# Patient Record
Sex: Female | Born: 1970
Health system: Southern US, Community
[De-identification: ages and names within clinical notes are randomized; demographics above are authoritative.]

## PROBLEM LIST (undated history)

## (undated) DIAGNOSIS — E119 Type 2 diabetes mellitus without complications: Secondary | ICD-10-CM

## (undated) DIAGNOSIS — I10 Essential (primary) hypertension: Secondary | ICD-10-CM

## (undated) DIAGNOSIS — E7800 Pure hypercholesterolemia, unspecified: Secondary | ICD-10-CM

## (undated) DIAGNOSIS — I219 Acute myocardial infarction, unspecified: Secondary | ICD-10-CM

## (undated) DIAGNOSIS — I2510 Atherosclerotic heart disease of native coronary artery without angina pectoris: Secondary | ICD-10-CM

## (undated) DIAGNOSIS — M792 Neuralgia and neuritis, unspecified: Secondary | ICD-10-CM

## (undated) DIAGNOSIS — Z720 Tobacco use: Secondary | ICD-10-CM

## (undated) DIAGNOSIS — F1410 Cocaine abuse, uncomplicated: Secondary | ICD-10-CM

## (undated) DIAGNOSIS — K219 Gastro-esophageal reflux disease without esophagitis: Secondary | ICD-10-CM

## (undated) HISTORY — DX: Neuralgia and neuritis, unspecified: M79.2

## (undated) HISTORY — DX: Type 2 diabetes mellitus without complications: E11.9

## (undated) HISTORY — DX: Tobacco use: Z72.0

## (undated) HISTORY — DX: Atherosclerotic heart disease of native coronary artery without angina pectoris: I25.10

---

## 2015-08-03 DIAGNOSIS — I2510 Atherosclerotic heart disease of native coronary artery without angina pectoris: Secondary | ICD-10-CM

## 2015-08-03 HISTORY — DX: Atherosclerotic heart disease of native coronary artery without angina pectoris: I25.10

## 2016-04-02 DIAGNOSIS — I219 Acute myocardial infarction, unspecified: Secondary | ICD-10-CM

## 2016-04-02 HISTORY — DX: Acute myocardial infarction, unspecified: I21.9

## 2017-06-09 ENCOUNTER — Ambulatory Visit (HOSPITAL_COMMUNITY)
Admission: AD | Admit: 2017-06-09 | Discharge: 2017-06-09 | Disposition: A | Discharge status: Home / Self Care | Payer: Self-pay | Attending: Psychiatry | Admitting: Psychiatry

## 2017-06-09 ENCOUNTER — Emergency Department (HOSPITAL_COMMUNITY)
Admission: EM | Admit: 2017-06-09 | Discharge: 2017-06-09 | Disposition: A | Discharge status: Home / Self Care | Payer: Self-pay | Attending: Emergency Medicine | Admitting: Emergency Medicine

## 2017-06-09 ENCOUNTER — Encounter (HOSPITAL_COMMUNITY): Payer: Self-pay | Admitting: Emergency Medicine

## 2017-06-09 ENCOUNTER — Emergency Department (HOSPITAL_COMMUNITY): Payer: Self-pay

## 2017-06-09 DIAGNOSIS — F332 Major depressive disorder, recurrent severe without psychotic features: Secondary | ICD-10-CM | POA: Insufficient documentation

## 2017-06-09 DIAGNOSIS — Z8679 Personal history of other diseases of the circulatory system: Secondary | ICD-10-CM | POA: Insufficient documentation

## 2017-06-09 DIAGNOSIS — R0789 Other chest pain: Secondary | ICD-10-CM | POA: Insufficient documentation

## 2017-06-09 DIAGNOSIS — F17210 Nicotine dependence, cigarettes, uncomplicated: Secondary | ICD-10-CM | POA: Insufficient documentation

## 2017-06-09 DIAGNOSIS — E119 Type 2 diabetes mellitus without complications: Secondary | ICD-10-CM | POA: Insufficient documentation

## 2017-06-09 DIAGNOSIS — R45851 Suicidal ideations: Secondary | ICD-10-CM | POA: Insufficient documentation

## 2017-06-09 DIAGNOSIS — F1490 Cocaine use, unspecified, uncomplicated: Secondary | ICD-10-CM | POA: Insufficient documentation

## 2017-06-09 DIAGNOSIS — I252 Old myocardial infarction: Secondary | ICD-10-CM | POA: Insufficient documentation

## 2017-06-09 DIAGNOSIS — F1414 Cocaine abuse with cocaine-induced mood disorder: Secondary | ICD-10-CM | POA: Insufficient documentation

## 2017-06-09 DIAGNOSIS — Z79899 Other long term (current) drug therapy: Secondary | ICD-10-CM | POA: Insufficient documentation

## 2017-06-09 DIAGNOSIS — I10 Essential (primary) hypertension: Secondary | ICD-10-CM | POA: Insufficient documentation

## 2017-06-09 DIAGNOSIS — R443 Hallucinations, unspecified: Secondary | ICD-10-CM

## 2017-06-09 DIAGNOSIS — F1910 Other psychoactive substance abuse, uncomplicated: Secondary | ICD-10-CM

## 2017-06-09 HISTORY — DX: Type 2 diabetes mellitus without complications: E11.9

## 2017-06-09 HISTORY — DX: Acute myocardial infarction, unspecified: I21.9

## 2017-06-09 HISTORY — DX: Essential (primary) hypertension: I10

## 2017-06-09 LAB — CBC WITH DIFFERENTIAL/PLATELET
BASOS ABS: 0 10*3/uL (ref 0.0–0.1)
Basophils Relative: 0 %
Eosinophils Absolute: 0 10*3/uL (ref 0.0–0.7)
Eosinophils Relative: 1 %
HEMATOCRIT: 29.7 % — AB (ref 36.0–46.0)
Hemoglobin: 9.6 g/dL — ABNORMAL LOW (ref 12.0–15.0)
LYMPHS ABS: 2.6 10*3/uL (ref 0.7–4.0)
LYMPHS PCT: 44 %
MCH: 24.9 pg — AB (ref 26.0–34.0)
MCHC: 32.3 g/dL (ref 30.0–36.0)
MCV: 77.1 fL — ABNORMAL LOW (ref 78.0–100.0)
MONO ABS: 0.6 10*3/uL (ref 0.1–1.0)
MONOS PCT: 10 %
NEUTROS ABS: 2.6 10*3/uL (ref 1.7–7.7)
Neutrophils Relative %: 45 %
Platelets: 269 10*3/uL (ref 150–400)
RBC: 3.85 MIL/uL — ABNORMAL LOW (ref 3.87–5.11)
RDW: 20.7 % — AB (ref 11.5–15.5)
WBC: 5.8 10*3/uL (ref 4.0–10.5)

## 2017-06-09 LAB — COMPREHENSIVE METABOLIC PANEL
ALBUMIN: 3.5 g/dL (ref 3.5–5.0)
ALT: 12 U/L — ABNORMAL LOW (ref 14–54)
ANION GAP: 6 (ref 5–15)
AST: 31 U/L (ref 15–41)
Alkaline Phosphatase: 58 U/L (ref 38–126)
BUN: 11 mg/dL (ref 6–20)
CHLORIDE: 105 mmol/L (ref 101–111)
CO2: 22 mmol/L (ref 22–32)
Calcium: 8.8 mg/dL — ABNORMAL LOW (ref 8.9–10.3)
Creatinine, Ser: 0.99 mg/dL (ref 0.44–1.00)
GFR calc Af Amer: >60 mL/min (ref >60)
GFR calc non Af Amer: >60 mL/min (ref >60)
GLUCOSE: 300 mg/dL — AB (ref 65–99)
POTASSIUM: 5.3 mmol/L — AB (ref 3.5–5.1)
SODIUM: 133 mmol/L — AB (ref 135–145)
TOTAL PROTEIN: 6.8 g/dL (ref 6.5–8.1)
Total Bilirubin: 1.5 mg/dL — ABNORMAL HIGH (ref 0.3–1.2)

## 2017-06-09 LAB — RAPID URINE DRUG SCREEN, HOSP PERFORMED
AMPHETAMINES: NOT DETECTED
Barbiturates: POSITIVE — AB
Benzodiazepines: NOT DETECTED
Cocaine: POSITIVE — AB
Opiates: NOT DETECTED
TETRAHYDROCANNABINOL: NOT DETECTED

## 2017-06-09 LAB — ACETAMINOPHEN LEVEL: Acetaminophen (Tylenol), Serum: <10 ug/mL — ABNORMAL LOW (ref 10–30)

## 2017-06-09 LAB — SALICYLATE LEVEL: Salicylate Lvl: <7 mg/dL (ref 2.8–30.0)

## 2017-06-09 LAB — URINALYSIS, ROUTINE W REFLEX MICROSCOPIC
Bilirubin Urine: NEGATIVE
HGB URINE DIPSTICK: NEGATIVE
Ketones, ur: NEGATIVE mg/dL
LEUKOCYTES UA: NEGATIVE
NITRITE: NEGATIVE
Protein, ur: NEGATIVE mg/dL
SPECIFIC GRAVITY, URINE: 1.036 — AB (ref 1.005–1.030)
pH: 5 (ref 5.0–8.0)

## 2017-06-09 LAB — I-STAT TROPONIN, ED
TROPONIN I, POC: 0 ng/mL (ref 0.00–0.08)
Troponin i, poc: 0 ng/mL (ref 0.00–0.08)

## 2017-06-09 LAB — ETHANOL

## 2017-06-09 IMAGING — DX DG CHEST 2V
2 series · 2 of 2 positions shown · non-contrast
Comparison: None.

CLINICAL DATA: Chest pain

EXAM:
CHEST  2 VIEW

[chest pa]
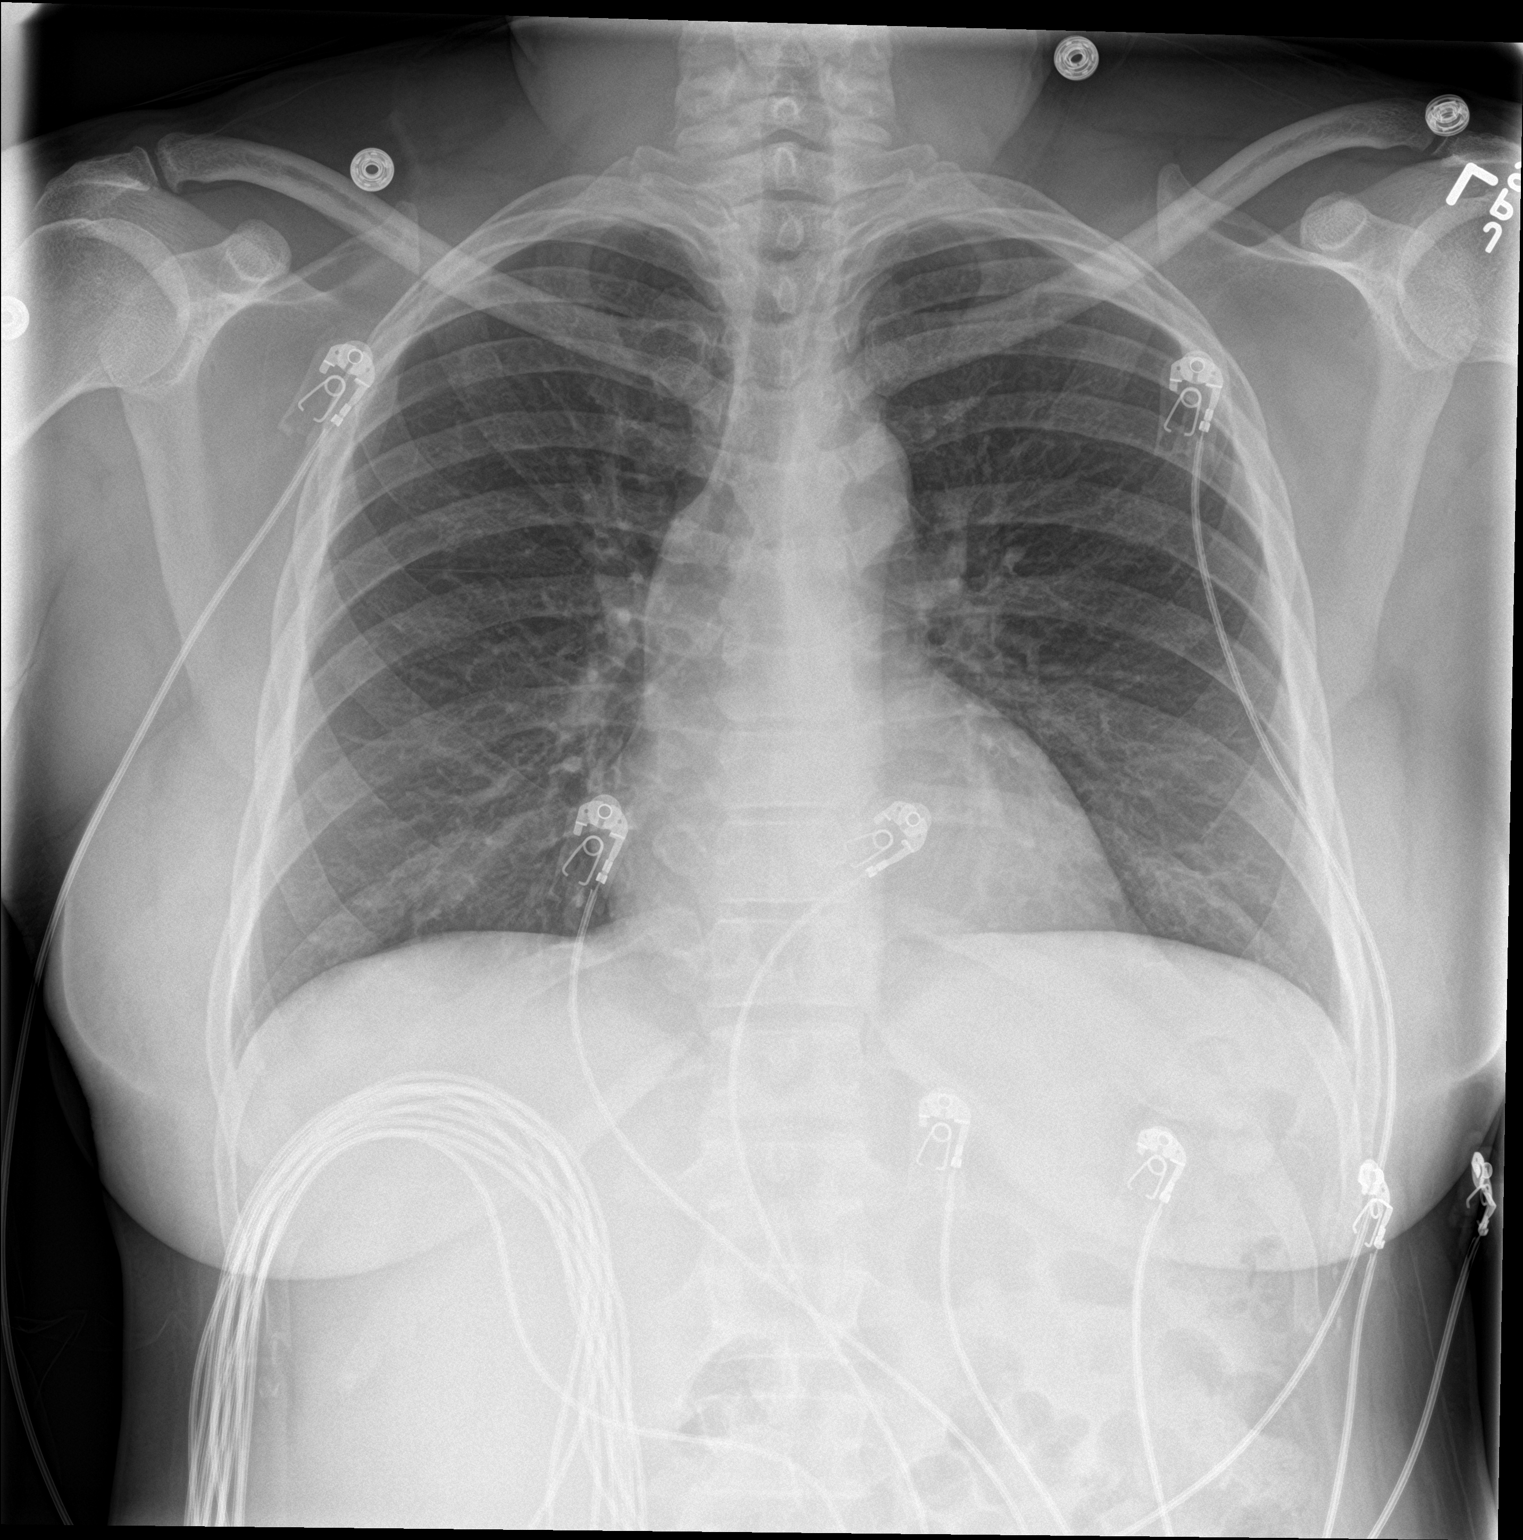

[chest lat]
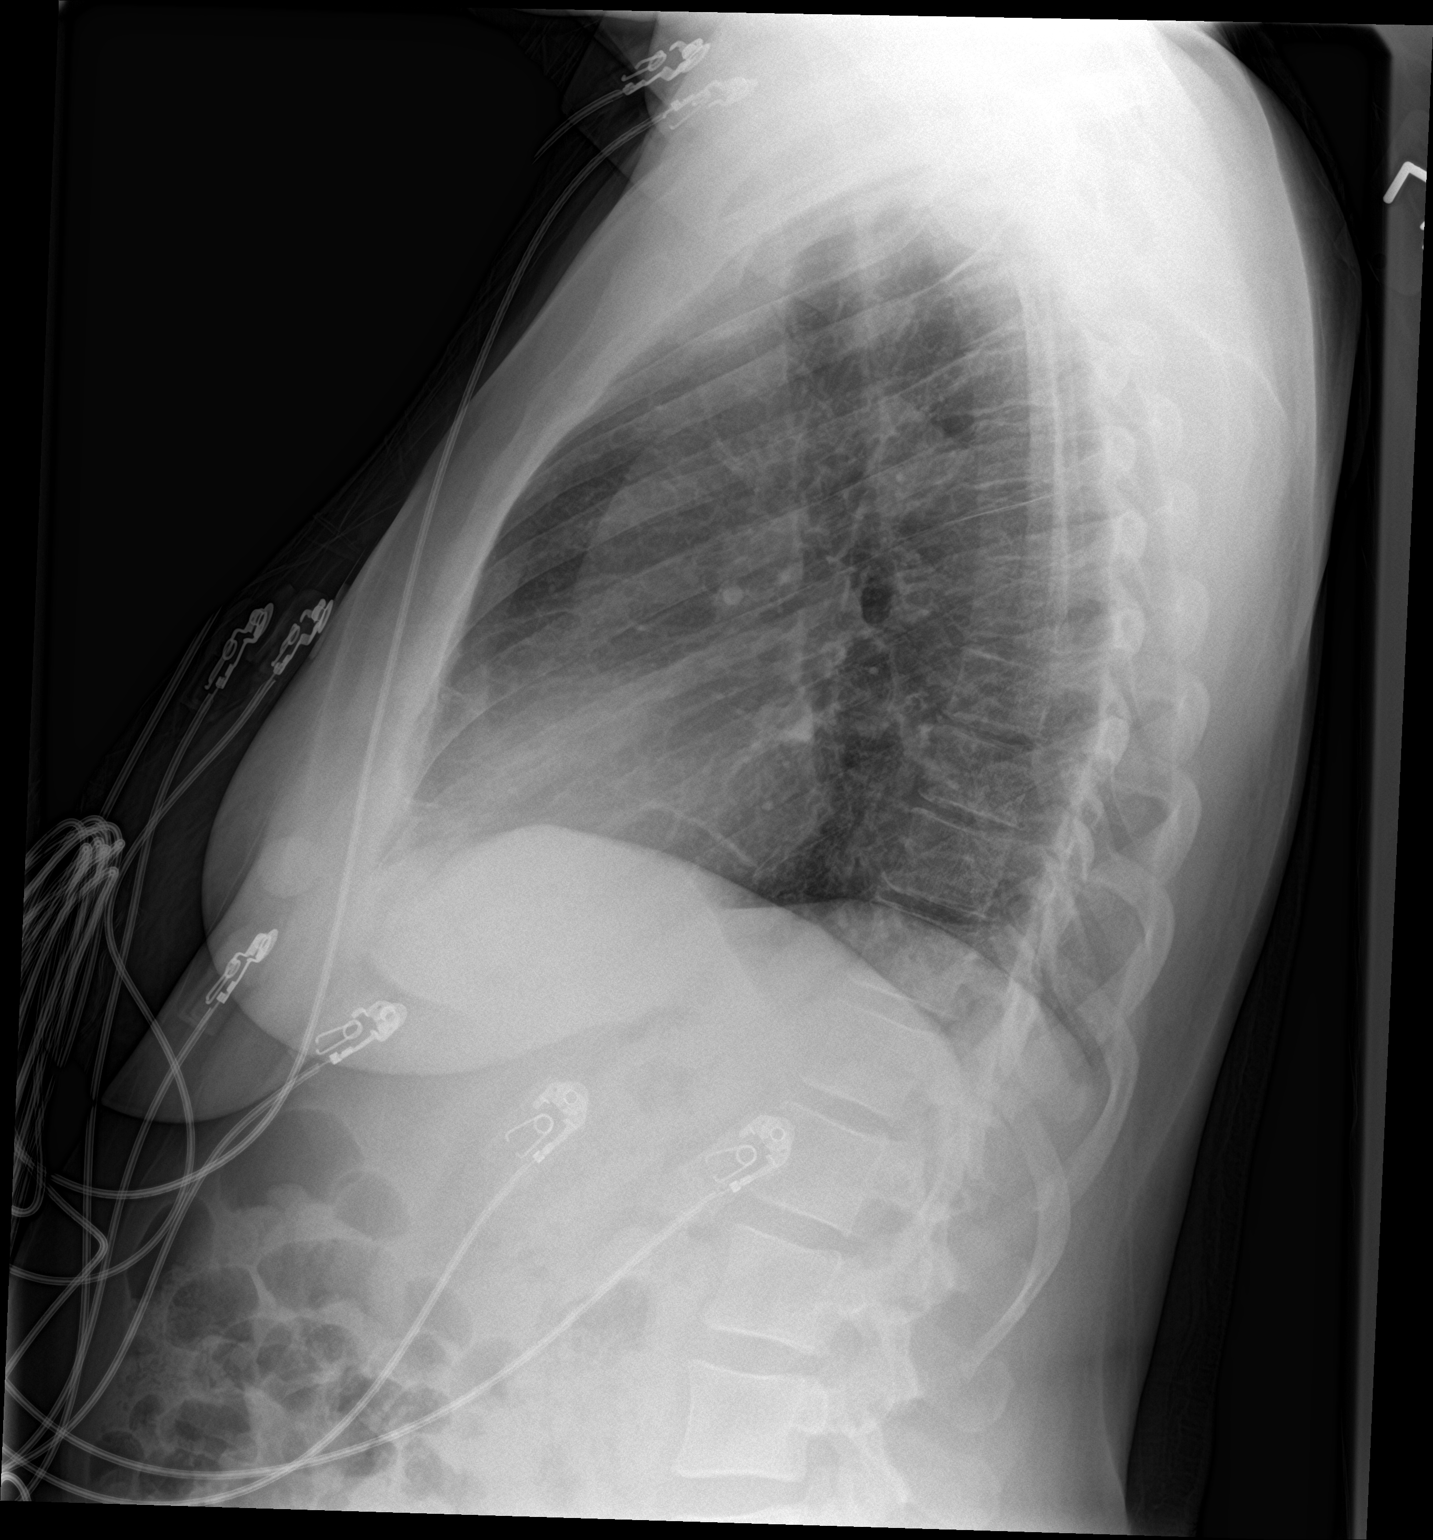

[2 of 2 positions shown; findings below may reference images not displayed]

FINDINGS: The heart size and mediastinal contours are within normal limits.
Both lungs are clear. The visualized skeletal structures are
unremarkable.
IMPRESSION: No active cardiopulmonary disease.

## 2017-06-09 MED ORDER — GI COCKTAIL ~~LOC~~
30.0000 mL | Freq: Once | ORAL | Status: AC
  Administered 2017-06-09: 30 mL via ORAL
  Filled 2017-06-09: qty 30

## 2017-06-09 MED ORDER — KETOROLAC TROMETHAMINE 30 MG/ML IJ SOLN
30.0000 mg | Freq: Once | INTRAMUSCULAR | Status: AC
  Administered 2017-06-09: 30 mg via INTRAVENOUS
  Filled 2017-06-09: qty 1

## 2017-06-09 NOTE — ED Triage Notes (Signed)
Per [facility] patient called out complaining of central,non-radiating, chest pain that is painful on palpation that woke her up at 5am today. Patient reports MI last year. 12 lead with ems shows inferior t wave inversion. Patient hyperglycemic with ems at 427. Patient reports being off her diabetic meds and blood thinner for over 1 week. EMS administered 324mg  aspirin, 1 nitro with minimal pain relief. Patient reports smoking history, and cocaine abuse. Patient last used cocaine yesterday. Patient reports SI, with a plan of "whatever works".

## 2017-06-09 NOTE — ED Notes (Signed)
All of patient's belongings given back to patient.  Documentation in medical records.

## 2017-06-09 NOTE — ED Provider Notes (Signed)
[facility]-EMERGENCY DEPT Provider Note   CSN: [id number] Arrival date & time: 06/09/17  1759     History   Chief Complaint Chief Complaint  Patient presents with  . Suicidal    HPI Maria Harper is a 46 y.o. female.  46 yo F with a chief complaint of suicidal ideation.  This been an ongoing issue for her.  She felt that the desire to kill herself and gotten much more severe this morning.  Her plan was to try and slit her wrist.  She went to the emergency department and was evaluated.  She also had chest pain at that time and had a delta troponin that was negative.  Her chest pain is significantly better at this time.  She was discharged after an evaluation by psychiatry.  As soon as she left she called 911 and was brought here by police.  The patient denies any change in her suicidality.  She states that she just feels that someone needs to help her.  She told me she has nothing to hold onto that is keeping her here.   The history is provided by the patient.  Illness  This is a recurrent problem. The current episode started more than 1 week ago. The problem occurs constantly. The problem has been gradually worsening. Pertinent negatives include no chest pain, no headaches and no shortness of breath. Nothing aggravates the symptoms. Nothing relieves the symptoms. She has tried nothing for the symptoms. The treatment provided no relief.    Past Medical History:  Diagnosis Date  . Diabetes mellitus without complication (HCC)   . Heart attack (HCC) 04/2016  . Hypertension     There are no active problems to display for this patient.   History reviewed. No pertinent surgical history.  OB History    No data available       Home Medications    Prior to Admission medications   Medication Sig Start Date End Date Taking? Authorizing Provider  atorvastatin (LIPITOR) 40 MG tablet Take 40 mg daily by mouth.    [provider]  clopidogrel  (PLAVIX) 75 MG tablet Take 75 mg daily by mouth.    [provider]  glimepiride (AMARYL) 4 MG tablet Take 4 mg daily with breakfast by mouth.    [provider]  metoprolol succinate (TOPROL-XL) 50 MG 24 hr tablet Take 50 mg daily by mouth. Take with or immediately following a meal.    [provider]  pantoprazole (PROTONIX) 40 MG tablet Take 40 mg daily by mouth.    [provider]    Family History No family history on file.  Social History Social History   Tobacco Use  . Smoking status: Current Every Day Smoker    Packs/day: 1.50    Years: 20.00    Pack years: 30.00    Types: Cigarettes  . Smokeless tobacco: Never Used  Substance Use Topics  . Alcohol use: Yes    Comment: "occasion wine cooler on a holiday"   . Drug use: Yes    Types: Cocaine    Comment: last used yesterday. States " I do it everyday"      Allergies   Contrast media [iodinated diagnostic agents]   Review of Systems Review of Systems  Constitutional: Negative for chills and fever.  HENT: Negative for congestion and rhinorrhea.   Eyes: Negative for redness and visual disturbance.  Respiratory: Negative for shortness of breath and wheezing.   Cardiovascular: Negative for  chest pain and palpitations.  Gastrointestinal: Negative for nausea and vomiting.  Genitourinary: Negative for dysuria and urgency.  Musculoskeletal: Negative for arthralgias and myalgias.  Skin: Negative for pallor and wound.  Neurological: Negative for dizziness and headaches.  Psychiatric/Behavioral: Positive for dysphoric mood and suicidal ideas.     Physical Exam Updated Vital Signs BP 132/82   Pulse 90   Temp 97.9 F (36.6 C) (Oral)   Resp 15   LMP 05/30/2017   SpO2 100%   Physical Exam  Constitutional: She is oriented to person, place, and time. She appears well-developed and well-nourished. No distress.  HENT:  Head: Normocephalic and atraumatic.  Eyes: EOM are normal. Pupils  are equal, round, and reactive to light.  Neck: Normal range of motion. Neck supple.  Cardiovascular: Normal rate and regular rhythm. Exam reveals no gallop and no friction rub.  No murmur heard. Pulmonary/Chest: Effort normal. She has no wheezes. She has no rales.  Abdominal: Soft. She exhibits no distension and no mass. There is no tenderness. There is no guarding.  Musculoskeletal: She exhibits no edema or tenderness.  Neurological: She is alert and oriented to person, place, and time.  Skin: Skin is warm and dry. She is not diaphoretic.  Psychiatric: She has a normal mood and affect. Her behavior is normal.  Nursing note and vitals reviewed.    ED Treatments / Results  Labs (all labs ordered are listed, but only abnormal results are displayed) Labs Reviewed - No data to display  EKG  EKG Interpretation None       Radiology Dg Chest 2 View  Result Date: 06/09/2017 CLINICAL DATA:  Chest pain EXAM: CHEST  2 VIEW COMPARISON:  None. FINDINGS: The heart size and mediastinal contours are within normal limits. Both lungs are clear. The visualized skeletal structures are unremarkable. IMPRESSION: No active cardiopulmonary disease. Electronically Signed   By: Signa Kellaylor  Stroud M.D.   On: 06/09/2017 11:27    Procedures Procedures (including critical care time)  Medications Ordered in ED Medications - No data to display   Initial Impression / Assessment and Plan / ED Course  I have reviewed the triage vital signs and the nursing notes.  Pertinent labs & imaging results that were available during my care of the patient were reviewed by me and considered in my medical decision making (see chart for details).     46 yo F with a chief complaint of suicidal ideation.  The patient was just evaluated by psychiatry a couple hours ago.  She was deemed safe for discharge.  She has had no change in her symptoms.  I discussed the case briefly with TTS.  They recommended follow-up plan is  prior.  As the patient has not had any significant change I feel no reason to redo a complete consult.  Given a list of homeless shelters as well as outpatient mental health facilities.  6:38 PM:  I have discussed the diagnosis/risks/treatment options with the patient and believe the pt to be eligible for discharge home to follow-up with PCP. We also discussed returning to the ED immediately if new or worsening sx occur. We discussed the sx which are most concerning (e.g., sudden worsening pain, fever, inability to tolerate by mouth) that necessitate immediate return. Medications administered to the patient during their visit and any new prescriptions provided to the patient are listed below.  Medications given during this visit Medications - No data to display   The patient appears reasonably screen and/or stabilized for  discharge and I doubt any other medical condition or other [facility] requiring further screening, evaluation, or treatment in the ED at this time prior to discharge.    Final Clinical Impressions(s) / ED Diagnoses   Final diagnoses:  Suicidal thoughts    ED Discharge Orders    None       Melene Plan, DO 06/09/17 1610
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
## 2017-06-09 NOTE — ED Notes (Signed)
Charge RN aware of sitter need as well as MD. Suicide Comptroller ordered

## 2017-06-09 NOTE — ED Notes (Signed)
Given patient two bus passes, directions to the [employer], and information for [facility].

## 2017-06-09 NOTE — ED Notes (Signed)
Patient received lunch tray 

## 2017-06-09 NOTE — ED Notes (Signed)
Gave patient some cranberry juice to drink in hopes that she will be able to urinate soon.   She is still unable to urinate.

## 2017-06-09 NOTE — ED Notes (Signed)
Patient states she is unable to urinate at this time. Advised that sample is needed.

## 2017-06-09 NOTE — ED Triage Notes (Signed)
Pt brought in by [employer] with complaints SI after leaving [facility] today. Hx of cocaine abuse. Denies pain.

## 2017-06-09 NOTE — ED Notes (Signed)
Patient complaining of chest pain.  Repeated EKG and showed to EDP.  NSR.

## 2017-06-09 NOTE — BHH Counselor (Signed)
Disposition: Per Dr. Lucianne Muss and Evlyn Clines, NP, patient does not meet criteria for inpatient. Discharge patient with resources.    Patient nurse notified of Disposition. TTS faxed homeless community resources to be given to patient.

## 2017-06-09 NOTE — ED Provider Notes (Addendum)
[facility] EMERGENCY DEPARTMENT Provider Note   CSN: [id number] Arrival date & time: 06/09/17  1011    History   Chief Complaint Chief Complaint  Patient presents with  . Chest Pain    HPI Maria Harper is a 46 y.o. female.  Patient is a 46 year old female with history of diabetes, reported prior MI in last year in [place].  She presents today for evaluation of chest discomfort.  This started at 5 AM and woke her from sleep.  She denies shortness of breath, nausea, or diaphoresis.  She tells me that she had a "heart attack" last year in [place].  She was told that she has a "blood clot around her heart" and is taking Plavix.  She denies to me that she had a heart cath or stents placed.  This patient apparently just arrived in [place] from [place] yesterday evening at 11:00.  She is apparently homeless and is here with a suitcase and her belongings.  She told the nurse that she also feels suicidal, however did not mention this to me.   The history is provided by the patient.  Chest Pain   This is a new problem. Episode onset: 5 AM. The problem occurs constantly. The problem has not changed since onset.The pain is at a severity of 3/10. The pain is mild. The quality of the pain is described as pressure-like. The pain does not radiate. Pertinent negatives include no dizziness, no nausea, no palpitations and no shortness of breath. She has tried nothing for the symptoms. The treatment provided no relief.    Past Medical History:  Diagnosis Date  . Diabetes mellitus without complication (HCC)   . Heart attack (HCC) 04/2016  . Hypertension     There are no active problems to display for this patient.   History reviewed. No pertinent surgical history.  OB History    No data available       Home Medications    Prior to Admission medications   Not on File    Family History No family history on file.  Social History Social History   Tobacco Use   . Smoking status: Current Every Day Smoker    Packs/day: 1.50    Years: 20.00    Pack years: 30.00    Types: Cigarettes  . Smokeless tobacco: Never Used  Substance Use Topics  . Alcohol use: Yes    Comment: "occasion wine cooler on a holiday"   . Drug use: Yes    Types: Cocaine    Comment: last used yesterday. States " I do it everyday"      Allergies   Patient has no allergy information on record.   Review of Systems Review of Systems  Respiratory: Negative for shortness of breath.   Cardiovascular: Positive for chest pain. Negative for palpitations.  Gastrointestinal: Negative for nausea.  Neurological: Negative for dizziness.  All other systems reviewed and are negative.    Physical Exam Updated Vital Signs BP 122/74 (BP Location: Right Arm)   Pulse 74   Temp 98 F (36.7 C) (Oral)   Resp 20   Ht 5\' 2"  (1.575 m)   Wt 67.6 kg (149 lb)   LMP 05/30/2017   SpO2 100%   BMI 27.25 kg/m   Physical Exam  Constitutional: She is oriented to person, place, and time. She appears well-developed and well-nourished. No distress.  HENT:  Head: Normocephalic and atraumatic.  Neck: Normal range of motion. Neck supple.  Cardiovascular: Normal rate,  regular rhythm, intact distal pulses and normal pulses. Exam reveals no gallop and no friction rub.  No murmur heard. Pulmonary/Chest: Effort normal and breath sounds normal. No respiratory distress. She has no wheezes.  Abdominal: Soft. Bowel sounds are normal. She exhibits no distension. There is no tenderness.  Musculoskeletal: Normal range of motion.  Neurological: She is alert and oriented to person, place, and time.  Skin: Skin is warm and dry. She is not diaphoretic.  Nursing note and vitals reviewed.    ED Treatments / Results  Labs (all labs ordered are listed, but only abnormal results are displayed) Labs Reviewed - No data to display  EKG  EKG Interpretation None       Radiology No results  found.  Procedures Procedures (including critical care time)  Medications Ordered in ED Medications - No data to display   Initial Impression / Assessment and Plan / ED Course  I have reviewed the triage vital signs and the nursing notes.  Pertinent labs & imaging results that were available during my care of the patient were reviewed by me and considered in my medical decision making (see chart for details).  Patient presenting here with complaints of chest discomfort that appears to be noncardiac.  Her EKG shows nonspecific T wave abnormalities, however troponin is negative despite discomfort for multiple hours.  Her symptoms are atypical for cardiac pain.  She is also reporting that she is feeling suicidal and is having serious thoughts about harming herself although she does not endorse a plan.  She tells me that she is "tired of all of this" referring to her medical issues and living arrangements.  I will consult TTS and have them speak with her.  She evaluated by TTS and felt not to meet inpatient criteria.  She will be discharged with outpatient resources and resources for homeless shelters.  Second troponin is negative and I highly doubt a cardiac etiology given the negative workup and atypical symptoms.  Final Clinical Impressions(s) / ED Diagnoses   Final diagnoses:  None    ED Discharge Orders    None       Geoffery Lyonselo, Jeorge Reister, MD 06/09/17 1401    Geoffery Lyonselo, Kimerly Rowand, MD 06/09/17 1610

## 2017-06-09 NOTE — ED Notes (Signed)
TTS at bedside. 

## 2017-06-09 NOTE — ED Notes (Signed)
Ordered lunch tray 

## 2017-06-10 ENCOUNTER — Encounter (HOSPITAL_COMMUNITY): Payer: Self-pay | Admitting: Emergency Medicine

## 2017-06-10 ENCOUNTER — Emergency Department (HOSPITAL_COMMUNITY)
Admission: EM | Admit: 2017-06-10 | Discharge: 2017-06-10 | Disposition: A | Discharge status: Home / Self Care | Payer: Self-pay | Attending: Emergency Medicine | Admitting: Emergency Medicine

## 2017-06-10 DIAGNOSIS — F1414 Cocaine abuse with cocaine-induced mood disorder: Secondary | ICD-10-CM | POA: Insufficient documentation

## 2017-06-10 DIAGNOSIS — R441 Visual hallucinations: Secondary | ICD-10-CM | POA: Insufficient documentation

## 2017-06-10 DIAGNOSIS — F329 Major depressive disorder, single episode, unspecified: Secondary | ICD-10-CM | POA: Insufficient documentation

## 2017-06-10 DIAGNOSIS — I10 Essential (primary) hypertension: Secondary | ICD-10-CM | POA: Insufficient documentation

## 2017-06-10 DIAGNOSIS — Z7902 Long term (current) use of antithrombotics/antiplatelets: Secondary | ICD-10-CM | POA: Insufficient documentation

## 2017-06-10 DIAGNOSIS — I252 Old myocardial infarction: Secondary | ICD-10-CM | POA: Insufficient documentation

## 2017-06-10 DIAGNOSIS — F17210 Nicotine dependence, cigarettes, uncomplicated: Secondary | ICD-10-CM | POA: Insufficient documentation

## 2017-06-10 DIAGNOSIS — E119 Type 2 diabetes mellitus without complications: Secondary | ICD-10-CM | POA: Insufficient documentation

## 2017-06-10 DIAGNOSIS — Z79899 Other long term (current) drug therapy: Secondary | ICD-10-CM | POA: Insufficient documentation

## 2017-06-10 DIAGNOSIS — R45851 Suicidal ideations: Secondary | ICD-10-CM | POA: Insufficient documentation

## 2017-06-10 LAB — CBG MONITORING, ED: GLUCOSE-CAPILLARY: 270 mg/dL — AB (ref 65–99)

## 2017-06-10 MED ORDER — GLIMEPIRIDE 4 MG PO TABS
4.0000 mg | ORAL_TABLET | Freq: Every day | ORAL | Status: DC
  Filled 2017-06-10: qty 1

## 2017-06-10 MED ORDER — ACETAMINOPHEN 325 MG PO TABS
650.0000 mg | ORAL_TABLET | ORAL | Status: DC | PRN
  Administered 2017-06-10: 650 mg via ORAL
  Filled 2017-06-10: qty 2

## 2017-06-10 MED ORDER — PANTOPRAZOLE SODIUM 40 MG PO TBEC: 40.0000 mg | DELAYED_RELEASE_TABLET | Freq: Every day | ORAL | Status: DC

## 2017-06-10 MED ORDER — NICOTINE 14 MG/24HR TD PT24: 14.0000 mg | MEDICATED_PATCH | Freq: Every day | TRANSDERMAL | Status: DC

## 2017-06-10 MED ORDER — ATORVASTATIN CALCIUM 40 MG PO TABS
40.0000 mg | ORAL_TABLET | Freq: Every day | ORAL | Status: DC
  Filled 2017-06-10: qty 1

## 2017-06-10 MED ORDER — ONDANSETRON HCL 4 MG PO TABS: 4.0000 mg | ORAL_TABLET | Freq: Three times a day (TID) | ORAL | Status: DC | PRN

## 2017-06-10 MED ORDER — CLOPIDOGREL BISULFATE 75 MG PO TABS
75.0000 mg | ORAL_TABLET | Freq: Every day | ORAL | Status: DC
  Filled 2017-06-10: qty 1

## 2017-06-10 MED ORDER — METOPROLOL SUCCINATE ER 50 MG PO TB24
50.0000 mg | ORAL_TABLET | Freq: Every day | ORAL | Status: DC
  Filled 2017-06-10: qty 1

## 2017-06-10 MED ORDER — ALUM & MAG HYDROXIDE-SIMETH 200-200-20 MG/5ML PO SUSP: 30.0000 mL | Freq: Four times a day (QID) | ORAL | Status: DC | PRN

## 2017-06-10 NOTE — BHH Counselor (Signed)
Clinician spoke to [place], [place] and expressed the pt was seen as a walk-in at [facility] and another TTS consult is not needed. Clinician expressed that the pt's husbands is in the ED with a similar presentation.    Redmond Pulling, MS, [facility], [facility] Triage Specialist [phone]

## 2017-06-10 NOTE — ED Triage Notes (Signed)
Pt comes in from [facility] with complaints of suicidal ideation.  Pt was seen and discharged by psychiatry twice yesterday on 11/8.  Pt also had complaints of chest pain yesterday which tests were deemed unremarkable. Hx of substance abuse. Ambulatory.  A&O x4.  Pt brought suitcase with her.

## 2017-06-10 NOTE — BHH Suicide Risk Assessment (Signed)
Suicide Risk Assessment  Discharge Assessment   [facility] Discharge Suicide Risk Assessment   Principal Problem: Cocaine abuse with cocaine-induced mood disorder [facility]) Discharge Diagnoses:  Patient Active Problem List   Diagnosis Date Noted  . Cocaine abuse with cocaine-induced mood disorder [facility]) [F14.14] 06/10/2017    Priority: High    Total Time spent with patient: 45 minutes  Musculoskeletal: Strength & Muscle Tone: within normal limits Gait & Station: normal Patient leans: N/A  Psychiatric Specialty Exam: Physical Exam  Constitutional: She is oriented to person, place, and time. She appears well-developed and well-nourished.  HENT:  Head: Normocephalic.  Neck: Normal range of motion.  Respiratory: Effort normal.  Musculoskeletal: Normal range of motion.  Neurological: She is alert and oriented to person, place, and time.  Psychiatric: She has a normal mood and affect. Her speech is normal and behavior is normal. Judgment and thought content normal. Cognition and memory are normal.    Review of Systems  Psychiatric/Behavioral: Positive for substance abuse.  All other systems reviewed and are negative.   Blood pressure 131/73, pulse 74, temperature 98 F (36.7 C), temperature source Oral, resp. rate 16, height 5\' 2"  (1.575 m), weight 67.6 kg (149 lb), last menstrual period 05/30/2017, SpO2 99 %.Body mass index is 27.25 kg/m.  General Appearance: Casual  Eye Contact:  Good  Speech:  Normal Rate  Volume:  Normal  Mood:  Irritable  Affect:  Congruent  Thought Process:  Coherent and Descriptions of Associations: Intact  Orientation:  Full (Time, Place, and Person)  Thought Content:  WDL and Logical  Suicidal Thoughts:  No  Homicidal Thoughts:  No  Memory:  Immediate;   Good Recent;   Good Remote;   Good  Judgement:  Fair  Insight:  Fair  Psychomotor Activity:  Normal  Concentration:  Concentration: Good and Attention Span: Good  Recall:  Good  Fund of Knowledge:   Fair  Language:  Good  Akathisia:  No  Handed:  Right  AIMS (if indicated):     Assets:  Leisure Time Physical Health Resilience Social Support  ADL's:  Intact  Cognition:  WNL  Sleep:      Mental Status Per Nursing Assessment::   On Admission:   cocaine abuse with hallucinations  Demographic Factors:  NA  Loss Factors: NA  Historical Factors: NA  Risk Reduction Factors:   Sense of responsibility to family, Living with another person, especially a relative and Positive social support  Continued Clinical Symptoms:  Irritable  Cognitive Features That Contribute To Risk:  None    Suicide Risk:  Minimal: No identifiable suicidal ideation.  Patients presenting with no risk factors but with morbid ruminations; may be classified as minimal risk based on the severity of the depressive symptoms    Plan Of Care/Follow-up recommendations:  Activity:  as tolerated Diet:  heart healthy diet  LORD, JAMISON, NP 06/10/2017, 12:08 PM

## 2017-06-10 NOTE — ED Provider Notes (Signed)
[facility]-EMERGENCY DEPT Provider Note   CSN: [id number] Arrival date & time: 06/10/17  0024     History   Chief Complaint Chief Complaint  Patient presents with  . Medical Clearance  . Suicidal    HPI Maria Harper is a 46 y.o. female.  The history is provided by the patient.  She returns the ED complaining of ongoing suicidal thoughts without plan.  She arrived in [place] about a week ago and has been depressed since getting here.  She does have history of depression and past suicide attempts.  She had been seen in the emergency department twice in the last 24 hours and did have a psychiatric evaluation.  However, she had returned to behavioral health and she was referred back to the ED for overnight observation and psychiatric reevaluation in the morning.  She states that although she has suicidal thoughts, she does not have a plan.  She does have a severe depression.  She also recently relapsed and is using large amounts of crack cocaine.  She denies alcohol use and denies other drug use.  She does admit to visual hallucinations but denies auditory hallucinations.  Past Medical History:  Diagnosis Date  . Diabetes mellitus without complication (HCC)   . Heart attack (HCC) 04/2016  . Hypertension     There are no active problems to display for this patient.   History reviewed. No pertinent surgical history.  OB History    No data available       Home Medications    Prior to Admission medications   Medication Sig Start Date End Date Taking? Authorizing Provider  atorvastatin (LIPITOR) 40 MG tablet Take 40 mg daily by mouth.   Yes [provider]  clopidogrel (PLAVIX) 75 MG tablet Take 75 mg daily by mouth.   Yes [provider]  glimepiride (AMARYL) 4 MG tablet Take 4 mg daily with breakfast by mouth.   Yes [provider]  metoprolol succinate (TOPROL-XL) 50 MG 24 hr tablet Take 50 mg daily by mouth. Take with  or immediately following a meal.   Yes [provider]  pantoprazole (PROTONIX) 40 MG tablet Take 40 mg daily by mouth.   Yes [provider]    Family History No family history on file.  Social History Social History   Tobacco Use  . Smoking status: Current Every Day Smoker    Packs/day: 1.50    Years: 20.00    Pack years: 30.00    Types: Cigarettes  . Smokeless tobacco: Never Used  Substance Use Topics  . Alcohol use: Yes    Comment: "occasion wine cooler on a holiday"   . Drug use: Yes    Types: Cocaine    Comment: last used yesterday. States " I do it everyday"      Allergies   Contrast media [iodinated diagnostic agents]   Review of Systems Review of Systems  All other systems reviewed and are negative.    Physical Exam Updated Vital Signs BP (!) 175/101 (BP Location: Right Arm)   Pulse 77   Temp (!) 97.5 F (36.4 C) (Oral)   Resp 18   LMP 05/30/2017   SpO2 99%   Physical Exam  Nursing note and vitals reviewed.  46 year old female, resting comfortably and in no acute distress. Vital signs are significant for hypertension. Oxygen saturation is 99%, which is normal. Head is normocephalic and atraumatic. PERRLA, EOMI. Oropharynx is clear. Neck is nontender and supple  without adenopathy or JVD. Back is nontender and there is no CVA tenderness. Lungs are clear without rales, wheezes, or rhonchi. Chest is nontender. Heart has regular rate and rhythm without murmur. Abdomen is soft, flat, nontender without masses or hepatosplenomegaly and peristalsis is normoactive. Extremities have no cyanosis or edema, full range of motion is present. Skin is warm and dry without rash. Neurologic: Mental status is normal, cranial nerves are intact, there are no motor or sensory deficits.  ED Treatments / Results   Procedures Procedures (including critical care time)  Medications Ordered in ED Medications  nicotine (NICODERM CQ - dosed in mg/24  hours) patch 14 mg (not administered)  alum & mag hydroxide-simeth (MAALOX/MYLANTA) 200-200-20 MG/5ML suspension 30 mL (not administered)  ondansetron (ZOFRAN) tablet 4 mg (not administered)  acetaminophen (TYLENOL) tablet 650 mg (not administered)     Initial Impression / Assessment and Plan / ED Course  I have reviewed the triage vital signs and the nursing notes.  Pertinent labs & imaging results that were available during my care of the patient were reviewed by me and considered in my medical decision making (see chart for details).  Ongoing major depression with suicidal thoughts.  Old records are reviewed confirming 2 ED visits in the last 24 hours and psychiatric evaluation stating she was safe for discharge.  However, there is also a note from behavioral health where she was seen just prior to coming back to our ED where it was recommended she be kept for overnight observation and reevaluation in the morning.  No need to repeat lab work that was done earlier.  She is moved to psychiatric holding for evaluation by psychiatry in the morning.  Final Clinical Impressions(s) / ED Diagnoses   Final diagnoses:  Severe episode of recurrent major depressive disorder, with psychotic features [facility](HCC)    ED Discharge Orders    None       Dione BoozeGlick, Ysabela Keisler, MD 06/10/17 [phone]

## 2017-06-10 NOTE — Discharge Instructions (Signed)
For your mental health needs, you are advised to follow up with [facility].  New and returning patients are seen at their walk-in clinic.  Walk-in hours are Monday - Friday from 8:00 am - 3:00 pm.  Walk-in patients are seen on a first come, first served basis.  Try to arrive as early as possible for he best chance of being seen the same day:       [facility]      [address]      [place], [place] [address]      [phone]  To help you maintain a sober lifestyle, a substance abuse treatment program may be beneficial to you.  Contact one of the following facilities at your earliest opportunity to ask about enrolling:  RESIDENTIAL PROGRAMS:       [facility]      [address] [place], [place] [address]      [phone]       [facility]      [address] [place], [place] [address]      [phone]  OUTPATIENT PROGRAMS:       [facility]      [address], [place] [address]      [phone]      New patients are seen at the walk-in clinic every Tuesday from 9:00 am - 12:00 pm  For your shelter needs, contact the following service providers:       [facility] ([facility])      [address] [place], [place] [address]      [phone]       [facility]      [address]      [place], [place] [address]      [phone]  For day shelter and other supportive services for the homeless, contact the [facility] [place]):       [facility]      [address]      [place], [place] [address]      [phone]  For transitional housing, contact one of the following agencies.  They provide longer term housing than a shelter, but there is an application process:       [facility]      [address]      [place], [place] [address]      [phone]       [facility]      [address], [place] [address]      [phone]

## 2017-06-10 NOTE — Consult Note (Signed)
[facility] Consult   Reason for Consult:  Cocaine abuse with suicidal ideations and psychosis Referring Physician:  EDP Patient Identification: Maria Harper MRN:  [id number] Principal Diagnosis: Cocaine abuse with cocaine induced mood disorder Diagnosis:   Patient Active Problem List   Diagnosis Date Noted  . Cocaine abuse with cocaine-induced mood disorder [facility]) [F14.14] 06/10/2017    Priority: High    Total Time spent with patient: 45 minutes  Subjective:   Maria Harper is a 46 y.o. female patient does not warrant admission.  HPI:  46 yo female who came to the ED after using cocaine and having hallucinations with passive suicidal ideations.  She slept and today, she is clear and coherent with no suicidal ideations.  Maria Harper is interested in shelter, drug, and mental health resources.  She was in [place] psychiatric hospital [place] and moved here with her husband who is in the ED for similar issues.  Stable for discharge.  Past Psychiatric History: cocaine abuse  Risk to Self: None Risk to Others: Homicidal Ideation: No Thoughts of Harm to Others: No Current Homicidal Intent: No Current Homicidal Plan: No Access to Homicidal Means: No Identified Victim: none report History of harm to others?: No Assessment of Violence: None Noted Violent Behavior Description: none report Does patient have access to weapons?: No Criminal Charges Pending?: No Does patient have a court date: No Prior Inpatient Therapy: Prior Inpatient Therapy: Yes Prior Therapy Dates: (multiple inpatient admits) Reason for Treatment: Depression, substance use Prior Outpatient Therapy: Prior Outpatient Therapy: No Does patient have an ACCT team?: No Does patient have Intensive In-House Services?  : No Does patient have [facility] services? : No Does patient have [facility] services?: No  Past Medical History:  Past Medical History:  Diagnosis Date  . Diabetes mellitus without  complication ([place])   . Heart attack ([place]) 04/2016  . Hypertension    History reviewed. No pertinent surgical history. Family History: No family history on file. Family Psychiatric  History: unknown Social History:  Social History   Substance and Sexual Activity  Alcohol Use Yes   Comment: "occasion wine cooler on a holiday"      Social History   Substance and Sexual Activity  Drug Use Yes  . Types: Cocaine   Comment: last used yesterday. States " I do it everyday"     Social History   Socioeconomic History  . Marital status: Married    Spouse name: None  . Number of children: None  . Years of education: None  . Highest education level: None  Social Needs  . Financial resource strain: None  . Food insecurity - worry: None  . Food insecurity - inability: None  . Transportation needs - medical: None  . Transportation needs - non-medical: None  Occupational History  . None  Tobacco Use  . Smoking status: Current Every Day Smoker    Packs/day: 1.50    Years: 20.00    Pack years: 30.00    Types: Cigarettes  . Smokeless tobacco: Never Used  Substance and Sexual Activity  . Alcohol use: Yes    Comment: "occasion wine cooler on a holiday"   . Drug use: Yes    Types: Cocaine    Comment: last used yesterday. States " I do it everyday"   . Sexual activity: None  Other Topics Concern  . None  Social History Narrative  . None   Additional Social History:    Allergies:   Allergies  Allergen Reactions  . Contrast Media [Iodinated Diagnostic Agents] Hives and Itching    Labs:  Results for orders placed or performed during the hospital encounter of 06/09/17 (from the past 48 hour(s))  Comprehensive metabolic panel     Status: Abnormal   Collection Time: 06/09/17 11:40 AM  Result Value Ref Range   Sodium 133 (L) 135 - 145 mmol/L   Potassium 5.3 (H) 3.5 - 5.1 mmol/L   Chloride 105 101 - 111 mmol/L   CO2 22 22 - 32 mmol/L   Glucose, Bld 300 (H) 65 - 99 mg/dL   BUN  11 6 - 20 mg/dL   Creatinine, Ser 0.99 0.44 - 1.00 mg/dL   Calcium 8.8 (L) 8.9 - 10.3 mg/dL   Total Protein 6.8 6.5 - 8.1 g/dL   Albumin 3.5 3.5 - 5.0 g/dL   AST 31 15 - 41 U/L   ALT 12 (L) 14 - 54 U/L   Alkaline Phosphatase 58 38 - 126 U/L   Total Bilirubin 1.5 (H) 0.3 - 1.2 mg/dL   GFR calc non Af Amer >60 >60 mL/min   GFR calc Af Amer >60 >60 mL/min    Comment: (NOTE) The eGFR has been calculated using the CKD EPI equation. This calculation has not been validated in all clinical situations. eGFR's persistently <60 mL/min signify possible Chronic Kidney Disease.    Anion gap 6 5 - 15  CBC with Differential     Status: Abnormal   Collection Time: 06/09/17 11:40 AM  Result Value Ref Range   WBC 5.8 4.0 - 10.5 K/uL   RBC 3.85 (L) 3.87 - 5.11 MIL/uL   Hemoglobin 9.6 (L) 12.0 - 15.0 g/dL   HCT 29.7 (L) 36.0 - 46.0 %   MCV 77.1 (L) 78.0 - 100.0 fL   MCH 24.9 (L) 26.0 - 34.0 pg   MCHC 32.3 30.0 - 36.0 g/dL   RDW 20.7 (H) 11.5 - 15.5 %   Platelets 269 150 - 400 K/uL   Neutrophils Relative % 45 %   Neutro Abs 2.6 1.7 - 7.7 K/uL   Lymphocytes Relative 44 %   Lymphs Abs 2.6 0.7 - 4.0 K/uL   Monocytes Relative 10 %   Monocytes Absolute 0.6 0.1 - 1.0 K/uL   Eosinophils Relative 1 %   Eosinophils Absolute 0.0 0.0 - 0.7 K/uL   Basophils Relative 0 %   Basophils Absolute 0.0 0.0 - 0.1 K/uL  Acetaminophen level     Status: Abnormal   Collection Time: 06/09/17 11:40 AM  Result Value Ref Range   Acetaminophen (Tylenol), Serum <10 (L) 10 - 30 ug/mL    Comment:        THERAPEUTIC CONCENTRATIONS VARY SIGNIFICANTLY. A RANGE OF 10-30 ug/mL MAY BE AN EFFECTIVE CONCENTRATION FOR MANY PATIENTS. HOWEVER, SOME ARE BEST TREATED AT CONCENTRATIONS OUTSIDE THIS RANGE. ACETAMINOPHEN CONCENTRATIONS >150 ug/mL AT 4 HOURS AFTER INGESTION AND >50 ug/mL AT 12 HOURS AFTER INGESTION ARE OFTEN ASSOCIATED WITH TOXIC REACTIONS.   Salicylate level     Status: None   Collection Time: 06/09/17 11:40  AM  Result Value Ref Range   Salicylate Lvl <0.9 2.8 - 30.0 mg/dL  Ethanol     Status: None   Collection Time: 06/09/17 11:40 AM  Result Value Ref Range   Alcohol, Ethyl (B) <10 <10 mg/dL    Comment:        LOWEST DETECTABLE LIMIT FOR SERUM ALCOHOL IS 10 mg/dL FOR MEDICAL PURPOSES ONLY   I-stat troponin, ED  Status: None   Collection Time: 06/09/17 11:53 AM  Result Value Ref Range   Troponin i, poc 0.00 0.00 - 0.08 ng/mL   Comment 3            Comment: Due to the release kinetics of cTnI, a negative result within the first hours of the onset of symptoms does not rule out myocardial infarction with certainty. If myocardial infarction is still suspected, repeat the test at appropriate intervals.   Urinalysis, Routine w reflex microscopic     Status: Abnormal   Collection Time: 06/09/17  3:11 PM  Result Value Ref Range   Color, Urine YELLOW YELLOW   APPearance CLOUDY (A) CLEAR   Specific Gravity, Urine 1.036 (H) 1.005 - 1.030   pH 5.0 5.0 - 8.0   Glucose, UA >=500 (A) NEGATIVE mg/dL   Hgb urine dipstick NEGATIVE NEGATIVE   Bilirubin Urine NEGATIVE NEGATIVE   Ketones, ur NEGATIVE NEGATIVE mg/dL   Protein, ur NEGATIVE NEGATIVE mg/dL   Nitrite NEGATIVE NEGATIVE   Leukocytes, UA NEGATIVE NEGATIVE   RBC / HPF 0-5 0 - 5 RBC/hpf   WBC, UA 0-5 0 - 5 WBC/hpf   Bacteria, UA FEW (A) NONE SEEN   Squamous Epithelial / LPF 6-30 (A) NONE SEEN   Mucus PRESENT   Urine rapid drug screen (hosp performed)     Status: Abnormal   Collection Time: 06/09/17  3:11 PM  Result Value Ref Range   Opiates NONE DETECTED NONE DETECTED   Cocaine POSITIVE (A) NONE DETECTED   Benzodiazepines NONE DETECTED NONE DETECTED   Amphetamines NONE DETECTED NONE DETECTED   Tetrahydrocannabinol NONE DETECTED NONE DETECTED   Barbiturates POSITIVE (A) NONE DETECTED    Comment:        DRUG SCREEN FOR MEDICAL PURPOSES ONLY.  IF CONFIRMATION IS NEEDED FOR ANY PURPOSE, NOTIFY LAB WITHIN 5 DAYS.        LOWEST  DETECTABLE LIMITS FOR URINE DRUG SCREEN Drug Class       Cutoff (ng/mL) Amphetamine      1000 Barbiturate      200 Benzodiazepine   595 Tricyclics       638 Opiates          300 Cocaine          300 THC              50   I-stat troponin, ED     Status: None   Collection Time: 06/09/17  3:40 PM  Result Value Ref Range   Troponin i, poc 0.00 0.00 - 0.08 ng/mL   Comment 3            Comment: Due to the release kinetics of cTnI, a negative result within the first hours of the onset of symptoms does not rule out myocardial infarction with certainty. If myocardial infarction is still suspected, repeat the test at appropriate intervals.     No current facility-administered medications for this encounter.    Current Outpatient Medications  Medication Sig Dispense Refill  . atorvastatin (LIPITOR) 40 MG tablet Take 40 mg daily by mouth.    . clopidogrel (PLAVIX) 75 MG tablet Take 75 mg daily by mouth.    Marland Kitchen glimepiride (AMARYL) 4 MG tablet Take 4 mg daily with breakfast by mouth.    . metoprolol succinate (TOPROL-XL) 50 MG 24 hr tablet Take 50 mg daily by mouth. Take with or immediately following a meal.    . pantoprazole (PROTONIX) 40 MG tablet Take 40  mg daily by mouth.     Facility-Administered Medications Ordered in Other Encounters  Medication Dose Route Frequency Provider Last Rate Last Dose  . acetaminophen (TYLENOL) tablet 650 mg  650 mg Oral E5I PRN Delora Fuel, MD   778 mg at 06/10/17 0215  . alum & mag hydroxide-simeth (MAALOX/MYLANTA) 200-200-20 MG/5ML suspension 30 mL  30 mL Oral E4M PRN Delora Fuel, MD      . atorvastatin (LIPITOR) tablet 40 mg  40 mg Oral Daily Delora Fuel, MD      . clopidogrel (PLAVIX) tablet 75 mg  75 mg Oral Daily Delora Fuel, MD      . glimepiride (AMARYL) tablet 4 mg  4 mg Oral Q breakfast Delora Fuel, MD      . metoprolol succinate (TOPROL-XL) 24 hr tablet 50 mg  50 mg Oral Daily Delora Fuel, MD      . nicotine (NICODERM CQ - dosed in mg/24  hours) patch 14 mg  14 mg Transdermal Daily Delora Fuel, MD      . ondansetron [facility]) tablet 4 mg  4 mg Oral P5T PRN Delora Fuel, MD      . pantoprazole (PROTONIX) EC tablet 40 mg  40 mg Oral Daily Delora Fuel, MD        Musculoskeletal: Strength & Muscle Tone: within normal limits Gait & Station: normal Patient leans: N/A  Psychiatric Specialty Exam: Physical Exam  Constitutional: She is oriented to person, place, and time. She appears well-developed and well-nourished.  HENT:  Head: Normocephalic.  Neck: Normal range of motion.  Respiratory: Effort normal.  Musculoskeletal: Normal range of motion.  Neurological: She is alert and oriented to person, place, and time.  Psychiatric: She has a normal mood and affect. Her speech is normal and behavior is normal. Judgment and thought content normal. Cognition and memory are normal.    Review of Systems  Psychiatric/Behavioral: Positive for substance abuse.  All other systems reviewed and are negative.   Blood pressure 131/73, pulse 74, temperature 98 F (36.7 C), temperature source Oral, resp. rate 16, height 5' 2"  (1.575 m), weight 67.6 kg (149 lb), last menstrual period 05/30/2017, SpO2 99 %.Body mass index is 27.25 kg/m.  General Appearance: Casual  Eye Contact:  Good  Speech:  Normal Rate  Volume:  Normal  Mood:  Irritable  Affect:  Congruent  Thought Process:  Coherent and Descriptions of Associations: Intact  Orientation:  Full (Time, Place, and Person)  Thought Content:  WDL and Logical  Suicidal Thoughts:  No  Homicidal Thoughts:  No  Memory:  Immediate;   Good Recent;   Good Remote;   Good  Judgement:  Fair  Insight:  Fair  Psychomotor Activity:  Normal  Concentration:  Concentration: Good and Attention Span: Good  Recall:  Good  Fund of Knowledge:  Fair  Language:  Good  Akathisia:  No  Handed:  Right  AIMS (if indicated):     Assets:  Leisure Time Physical Health Resilience Social Support  ADL's:   Intact  Cognition:  WNL  Sleep:        Treatment Plan Summary: Daily contact with patient to assess and evaluate symptoms and progress in treatment, Medication management and Plan cocaine abuse with cocaine induced mood disorder:  -Crisis stabilization -Medication management:  Medical medications restarted -Individual counseling  Disposition: No evidence of imminent risk to self or others at present.    Waylan Boga, NP 06/10/2017 12:00 PM   Patient seen face-to-face for psychiatric evaluation,  chart reviewed and case discussed with the physician extender and developed treatment plan. Reviewed the information documented and agree with the treatment plan.  Buford Dresser, DO

## 2017-06-10 NOTE — H&P (Signed)
Behavioral Health Medical Screening Exam  Maria Harper is an 46 y.o. female.  Total Time spent with patient: 15 minutes  Psychiatric Specialty Exam: Physical Exam  Constitutional: She is oriented to person, place, and time. She appears well-developed and well-nourished. No distress.  HENT:  Head: Normocephalic and atraumatic.  Right Ear: External ear normal.  Left Ear: External ear normal.  Eyes: Conjunctivae are normal. Right eye exhibits no discharge. Left eye exhibits no discharge. No scleral icterus.  Cardiovascular: Normal rate, regular rhythm and normal heart sounds.  Respiratory: Effort normal and breath sounds normal. No respiratory distress.  Musculoskeletal: Normal range of motion.  Neurological: She is alert and oriented to person, place, and time.  Skin: Skin is warm and dry. She is not diaphoretic.  Psychiatric: Her speech is normal. Her mood appears anxious. Her affect is not blunt, not labile and not inappropriate. She is not withdrawn and not actively hallucinating. Thought content is delusional. Thought content is not paranoid. Cognition and memory are normal. She expresses impulsivity and inappropriate judgment. She exhibits a depressed mood. She expresses suicidal ideation. She expresses no homicidal ideation.    Review of Systems  Psychiatric/Behavioral: Positive for depression, hallucinations, substance abuse and suicidal ideas. Negative for memory loss. The patient is nervous/anxious and has insomnia.   All other systems reviewed and are negative.   Blood pressure (!) 175/101, pulse 77, temperature (!) 97.5 F (36.4 C), temperature source Oral, resp. rate 18, last menstrual period 05/30/2017, SpO2 99 %.There is no height or weight on file to calculate BMI.  General Appearance: Casual and Disheveled  Eye Contact:  Fair  Speech:  Clear and Coherent and Normal Rate  Volume:  Normal  Mood:  Anxious, Depressed, Hopeless, Irritable and Worthless  Affect:  Congruent  and Depressed  Thought Process:  Coherent and Goal Directed  Orientation:  Full (Time, Place, and Person)  Thought Content:  Logical and Hallucinations: Visual  Suicidal Thoughts:  Yes, no specific plan  Homicidal Thoughts:  No  Memory:  Immediate;   Good Recent;   Good Remote;   Good  Judgement:  Intact  Insight:  Good  Psychomotor Activity:  Normal  Concentration: Concentration: Good and Attention Span: Good  Recall:  Good  Fund of Knowledge:Good  Language: Good  Akathisia:  NA  Handed:  Right  AIMS (if indicated):     Assets:  Communication Skills Desire for Improvement Intimacy Leisure Time Physical Health  Sleep:       Musculoskeletal: Strength & Muscle Tone: within normal limits Gait & Station: normal   Blood pressure (!) 175/101, pulse 77, temperature (!) 97.5 F (36.4 C), temperature source Oral, resp. rate 18, last menstrual period 05/30/2017, SpO2 99 %.  Recommendations:  Based on my evaluation the patient does not appear to have an emergency medical condition.  Jackelyn Poling, NP 06/10/2017, 12:55 AM

## 2017-06-10 NOTE — ED Notes (Signed)
Bed: WTR5 Expected date:  Expected time:  Means of arrival:  Comments: 

## 2017-06-10 NOTE — ED Notes (Signed)
Patient asking for food. Patient was given a sandwich before previous discharge.

## 2017-06-10 NOTE — ED Notes (Signed)
Pt left with husband before medications could be administered.

## 2017-06-10 NOTE — BH Assessment (Addendum)
[facility] Assessment Progress Note  Per Juanetta Beets, DO, this pt does not require psychiatric hospitalization at this time.  Pt is to be discharged from [facility] with referral information for [facility] and for area substance abuse treatment providers.  She also requests information about area supportive services for the homeless.  These have been included in pt's discharge instructions.  Pt would also benefit from seeing Peer Support Specialists; they will be asked to speak to pt.  Pt's nurse has been notified.  Doylene Canning, MA Triage Specialist [phone]

## 2017-11-24 ENCOUNTER — Emergency Department (HOSPITAL_COMMUNITY): Payer: Self-pay

## 2017-11-24 ENCOUNTER — Emergency Department (HOSPITAL_COMMUNITY)
Admission: EM | Admit: 2017-11-24 | Discharge: 2017-11-24 | Disposition: A | Discharge status: Home / Self Care | Payer: Self-pay | Attending: Emergency Medicine | Admitting: Emergency Medicine

## 2017-11-24 ENCOUNTER — Other Ambulatory Visit: Payer: Self-pay

## 2017-11-24 ENCOUNTER — Encounter (HOSPITAL_COMMUNITY): Payer: Self-pay

## 2017-11-24 DIAGNOSIS — R0602 Shortness of breath: Secondary | ICD-10-CM | POA: Insufficient documentation

## 2017-11-24 DIAGNOSIS — R079 Chest pain, unspecified: Secondary | ICD-10-CM | POA: Insufficient documentation

## 2017-11-24 DIAGNOSIS — Z5321 Procedure and treatment not carried out due to patient leaving prior to being seen by health care provider: Secondary | ICD-10-CM | POA: Insufficient documentation

## 2017-11-24 LAB — BASIC METABOLIC PANEL
Anion gap: 10 (ref 5–15)
BUN: 18 mg/dL (ref 6–20)
CHLORIDE: 107 mmol/L (ref 101–111)
CO2: 20 mmol/L — ABNORMAL LOW (ref 22–32)
CREATININE: 0.85 mg/dL (ref 0.44–1.00)
Calcium: 9.5 mg/dL (ref 8.9–10.3)
GFR calc Af Amer: >60 mL/min (ref >60)
Glucose, Bld: 266 mg/dL — ABNORMAL HIGH (ref 65–99)
Potassium: 3.7 mmol/L (ref 3.5–5.1)
Sodium: 137 mmol/L (ref 135–145)

## 2017-11-24 LAB — CBC
HCT: 34.9 % — ABNORMAL LOW (ref 36.0–46.0)
Hemoglobin: 11.9 g/dL — ABNORMAL LOW (ref 12.0–15.0)
MCH: 32 pg (ref 26.0–34.0)
MCHC: 34.1 g/dL (ref 30.0–36.0)
MCV: 93.8 fL (ref 78.0–100.0)
PLATELETS: 237 10*3/uL (ref 150–400)
RBC: 3.72 MIL/uL — ABNORMAL LOW (ref 3.87–5.11)
RDW: 14.5 % (ref 11.5–15.5)
WBC: 7.9 10*3/uL (ref 4.0–10.5)

## 2017-11-24 LAB — I-STAT BETA HCG BLOOD, ED (MC, WL, AP ONLY): I-stat hCG, quantitative: <5 m[IU]/mL (ref <5)

## 2017-11-24 LAB — I-STAT TROPONIN, ED: Troponin i, poc: 0 ng/mL (ref 0.00–0.08)

## 2017-11-24 LAB — CBG MONITORING, ED: Glucose-Capillary: 241 mg/dL — ABNORMAL HIGH (ref 65–99)

## 2017-11-24 IMAGING — DX DG CHEST 2V
2 series · 2 of 2 positions shown · non-contrast
Comparison: [DATE]

CLINICAL DATA: Worsening weakness over the last several days.
Shortness of breath and chest pain.

EXAM:
CHEST - 2 VIEW

[chest pa]
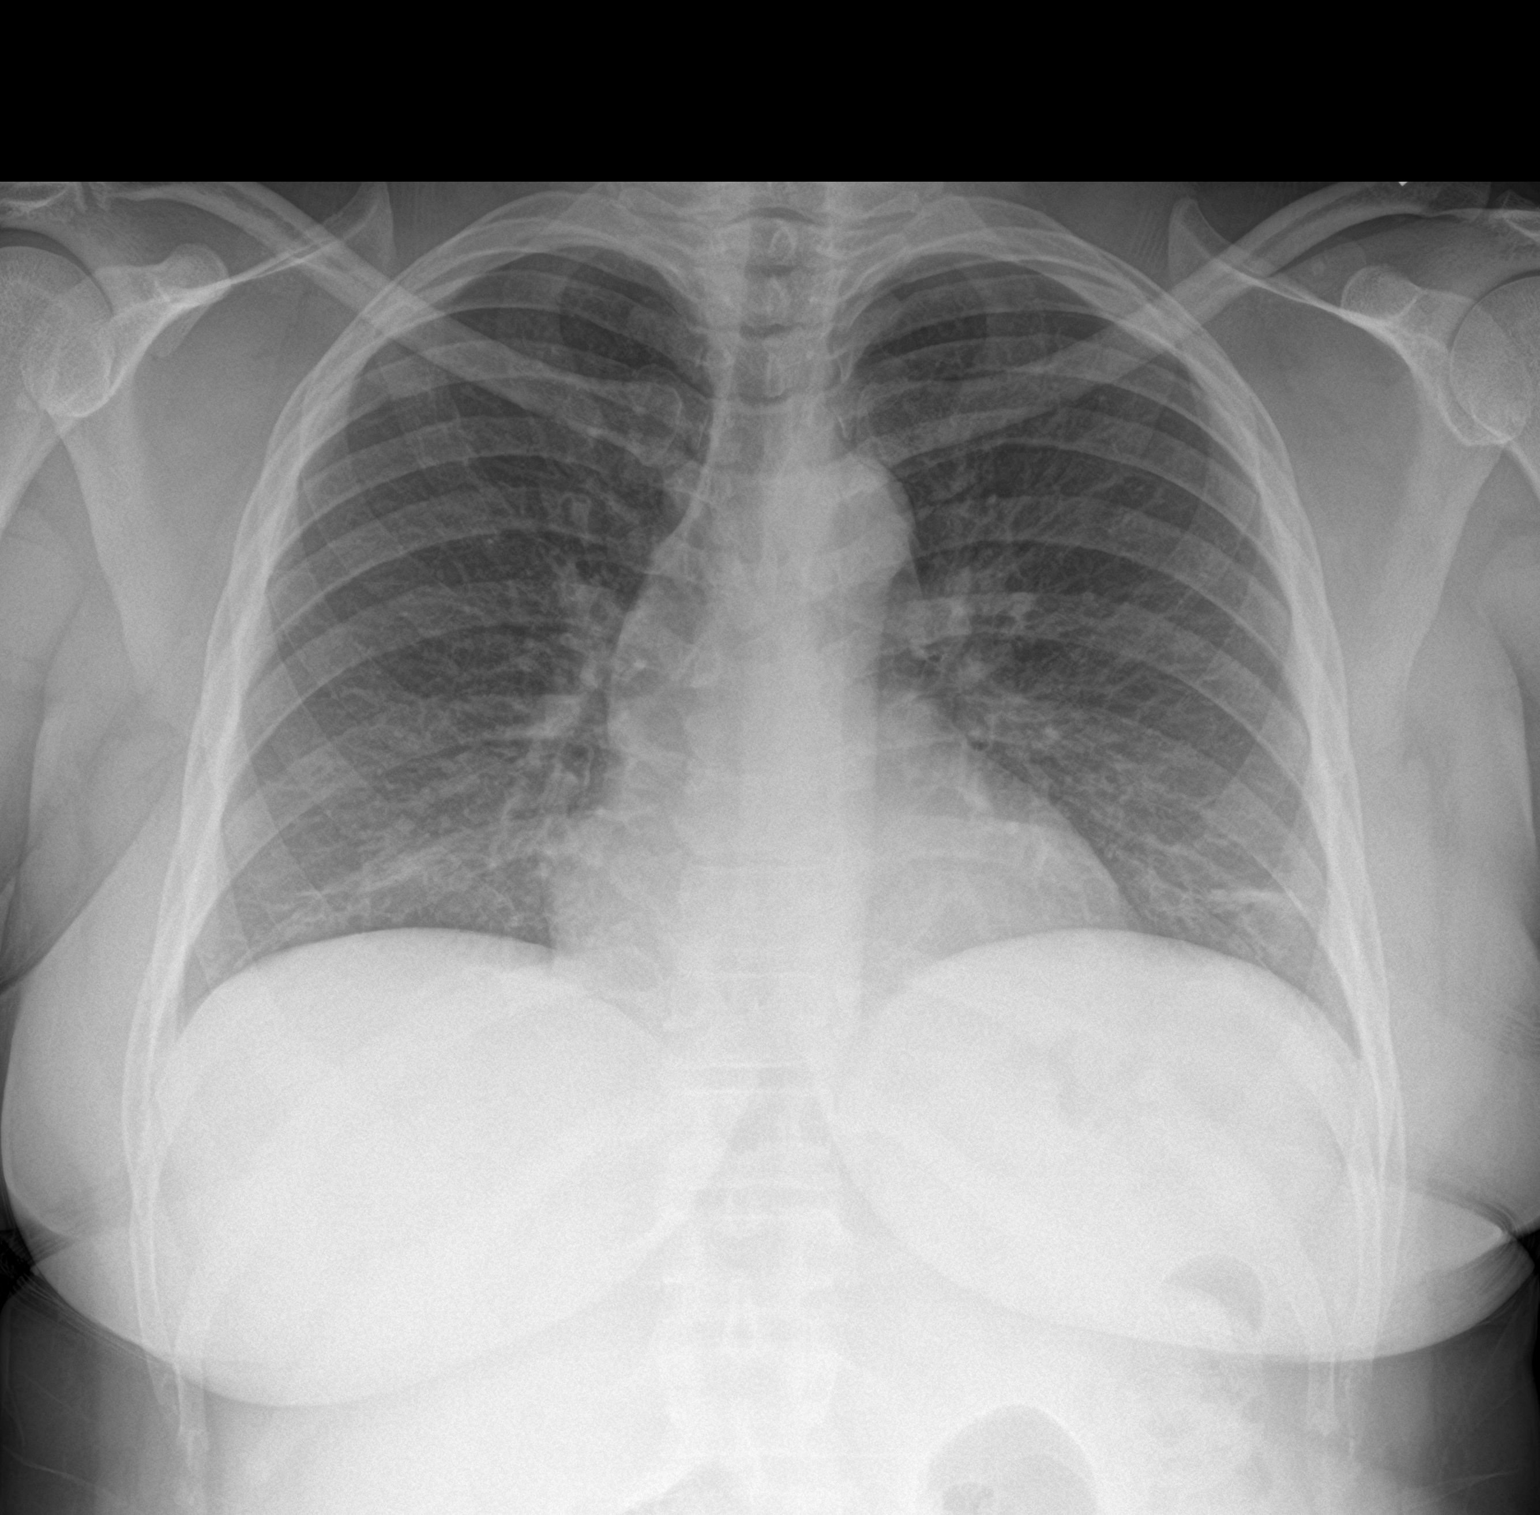

[chest lat]
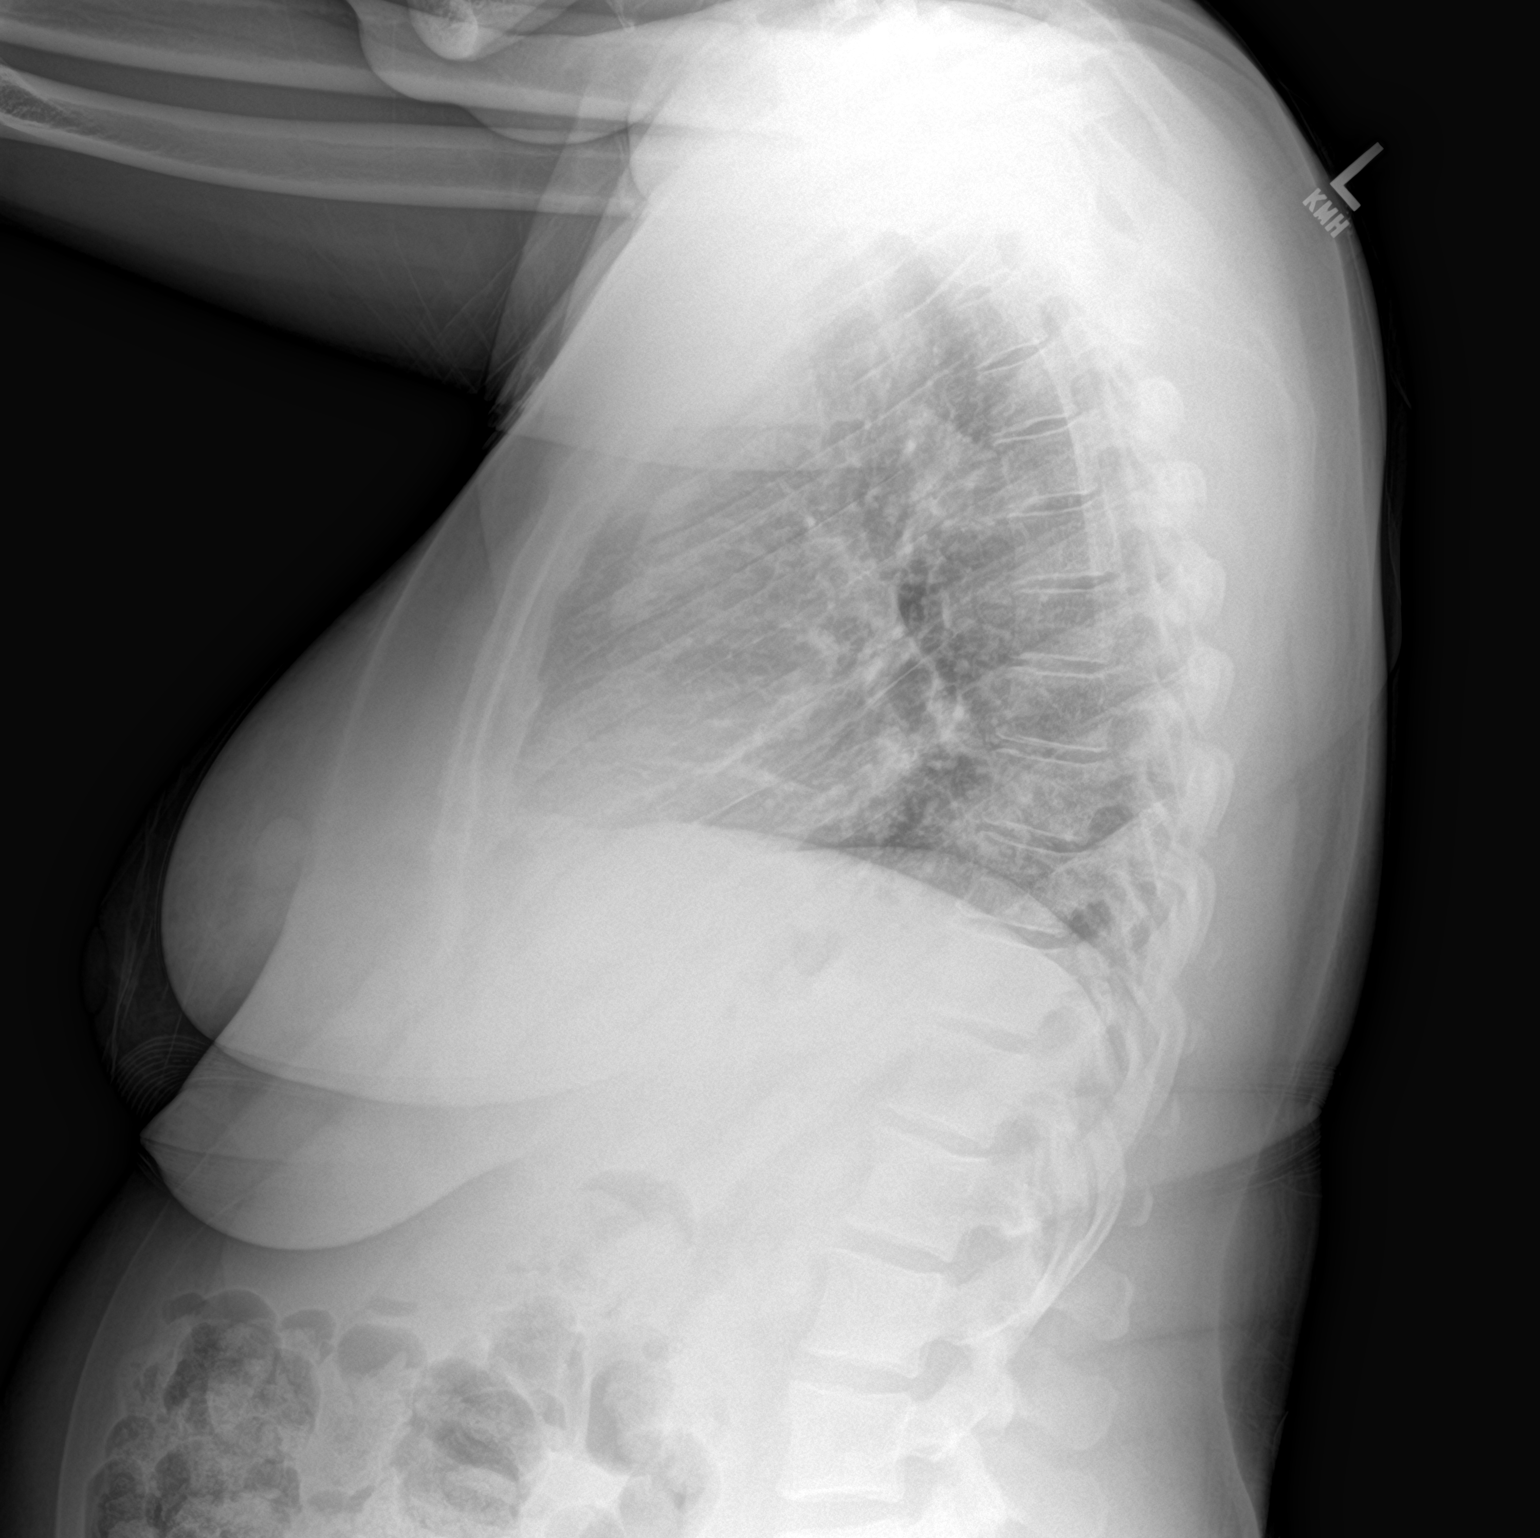

[2 of 2 positions shown; findings below may reference images not displayed]

FINDINGS: Heart size is at the upper limits of normal. Mediastinal shadows are
normal. Slightly increased patchy density at both lung bases that
could be simple atelectasis or mild atelectatic pneumonia. No
advanced disease. No effusions. Bony structures unremarkable.
IMPRESSION: Increased patchy density at both lung bases that could be
atelectasis or mild atelectatic pneumonia.

## 2017-11-24 NOTE — ED Notes (Signed)
Called Pt in lobby for vital recheck, no response in lobby x2. 

## 2017-11-24 NOTE — ED Notes (Signed)
Called for Pt in lobby for vital recheck, no response in lobby x1.

## 2017-11-24 NOTE — ED Triage Notes (Signed)
Pt is alert and oriented x 4 and is verbally responsive.Pt reports increased weakness over the past 3-4 days, with associated SOB with associated mid sternal chest pain 8/10 squeezing sensation.  Pt reports that she has been having chills, increased urination. Pt is a diabetic Pt is able to talk in full sentence, Pt report " funny feeling" in her left arm  past few nights and describes as an ache. Pt escorted with spouse.

## 2017-11-24 NOTE — ED Notes (Signed)
Pt called in waiting area no answer

## 2017-11-24 NOTE — ED Notes (Signed)
Called Pt for vital recheck in lobby, no response in lobby x3.

## 2017-12-23 ENCOUNTER — Encounter (HOSPITAL_COMMUNITY): Payer: Self-pay | Admitting: Emergency Medicine

## 2017-12-23 ENCOUNTER — Other Ambulatory Visit: Payer: Self-pay

## 2017-12-23 ENCOUNTER — Emergency Department (HOSPITAL_COMMUNITY)
Admission: EM | Admit: 2017-12-23 | Discharge: 2017-12-24 | Disposition: A | Discharge status: Home / Self Care | Payer: Self-pay | Attending: Emergency Medicine | Admitting: Emergency Medicine

## 2017-12-23 DIAGNOSIS — F17210 Nicotine dependence, cigarettes, uncomplicated: Secondary | ICD-10-CM | POA: Insufficient documentation

## 2017-12-23 DIAGNOSIS — E119 Type 2 diabetes mellitus without complications: Secondary | ICD-10-CM | POA: Insufficient documentation

## 2017-12-23 DIAGNOSIS — Z79899 Other long term (current) drug therapy: Secondary | ICD-10-CM | POA: Insufficient documentation

## 2017-12-23 DIAGNOSIS — I10 Essential (primary) hypertension: Secondary | ICD-10-CM | POA: Insufficient documentation

## 2017-12-23 DIAGNOSIS — L03011 Cellulitis of right finger: Secondary | ICD-10-CM

## 2017-12-23 DIAGNOSIS — L03012 Cellulitis of left finger: Secondary | ICD-10-CM | POA: Insufficient documentation

## 2017-12-23 DIAGNOSIS — I252 Old myocardial infarction: Secondary | ICD-10-CM | POA: Insufficient documentation

## 2017-12-23 HISTORY — DX: Gastro-esophageal reflux disease without esophagitis: K21.9

## 2017-12-23 HISTORY — DX: Pure hypercholesterolemia, unspecified: E78.00

## 2017-12-23 NOTE — ED Triage Notes (Signed)
Pt is c/o pain to her right middle finger around the nail bed  Pt has swelling and redness noted  Sxs started 4 days ago

## 2017-12-24 MED ORDER — CEPHALEXIN 500 MG PO CAPS: 500.0000 mg | ORAL_CAPSULE | Freq: Four times a day (QID) | ORAL | 0 refills | Status: DC

## 2017-12-24 MED ORDER — LIDOCAINE HCL (PF) 1 % IJ SOLN
5.0000 mL | Freq: Once | INTRAMUSCULAR | Status: AC
  Administered 2017-12-24: 5 mL via INTRADERMAL
  Filled 2017-12-24: qty 30

## 2017-12-24 MED ORDER — CEPHALEXIN 500 MG PO CAPS
500.0000 mg | ORAL_CAPSULE | Freq: Once | ORAL | Status: DC
  Filled 2017-12-24: qty 1

## 2017-12-24 NOTE — ED Notes (Signed)
Provider at bedside

## 2017-12-24 NOTE — ED Provider Notes (Addendum)
[facility] Provider Note   CSN: [id number] Arrival date & time: 12/23/17  2217     History   Chief Complaint Chief Complaint  Patient presents with  . Hand Pain    HPI Maria Harper is a 47 y.o. female.  Patient presents with pain and swelling to right middle finger x 2 days. No known injury. No drainage, bleeding or wound.   The history is provided by the patient. No language interpreter was used.  Hand Pain     Past Medical History:  Diagnosis Date  . Acid reflux   . Diabetes mellitus without complication (HCC)   . Heart attack (HCC) 04/2016  . High cholesterol   . Hypertension     Patient Active Problem List   Diagnosis Date Noted  . Cocaine abuse with cocaine-induced mood disorder (HCC) 06/10/2017    History reviewed. No pertinent surgical history.   OB History   None      Home Medications    Prior to Admission medications   Medication Sig Start Date End Date Taking? Authorizing Provider  atorvastatin (LIPITOR) 40 MG tablet Take 40 mg daily by mouth.    [provider]  clopidogrel (PLAVIX) 75 MG tablet Take 75 mg daily by mouth.    [provider]  glimepiride (AMARYL) 4 MG tablet Take 4 mg daily with breakfast by mouth.    [provider]  metoprolol succinate (TOPROL-XL) 50 MG 24 hr tablet Take 50 mg daily by mouth. Take with or immediately following a meal.    [provider]  pantoprazole (PROTONIX) 40 MG tablet Take 40 mg daily by mouth.    [provider]    Family History History reviewed. No pertinent family history.  Social History Social History   Tobacco Use  . Smoking status: Current Every Day Smoker    Packs/day: 1.50    Years: 20.00    Pack years: 30.00    Types: Cigarettes  . Smokeless tobacco: Never Used  Substance Use Topics  . Alcohol use: Yes    Comment: "occasion wine cooler on a holiday"   . Drug use: Not Currently    Comment: last  used yesterday. States " I do it everyday"      Allergies   Contrast media [iodinated diagnostic agents]   Review of Systems Review of Systems  Constitutional: Negative for fever.  Musculoskeletal:       See HPI.  Skin: Positive for color change. Negative for wound.  Neurological: Negative for numbness.     Physical Exam Updated Vital Signs BP (!) 123/94 (BP Location: Left Arm)   Pulse 83   Temp 98.2 F (36.8 C) (Oral)   Resp 18   LMP 11/30/2017 (Exact Date)   SpO2 99%   Physical Exam  Constitutional: She is oriented to person, place, and time. She appears well-developed and well-nourished.  Neck: Normal range of motion.  Pulmonary/Chest: Effort normal.  Musculoskeletal:  Right middle finger swollen and fluctuant adjacent to finger nail c/w paronychia  Neurological: She is alert and oriented to person, place, and time.  Skin: Skin is warm and dry.     ED Treatments / Results  Labs (all labs ordered are listed, but only abnormal results are displayed) Labs Reviewed - No data to display  EKG None  Radiology No results found.  Procedures Drain paronychia Date/Time: 12/24/2017 2:14 AM Performed by: Elpidio Anis, PA-C Authorized by: Elpidio Anis, PA-C  Consent: Verbal consent obtained. Consent  given by: patient Patient understanding: patient states understanding of the procedure being performed Time out: Immediately prior to procedure a "time out" was called to verify the correct patient, procedure, equipment, support staff and site/side marked as required. Local anesthesia used: yes Anesthesia: digital block  Anesthesia: Local anesthesia used: yes Local Anesthetic: lidocaine 1% without epinephrine Anesthetic total: 3 mL  Sedation: Patient sedated: no  Patient tolerance: Patient tolerated the procedure well with no immediate complications    (including critical care time)  Medications Ordered in ED Medications  lidocaine (PF) (XYLOCAINE) 1 %  injection 5 mL (has no administration in time range)     Initial Impression / Assessment and Plan / ED Course  I have reviewed the triage vital signs and the nursing notes.  Pertinent labs & imaging results that were available during my care of the patient were reviewed by me and considered in my medical decision making (see chart for details).     Patient presents with uncomplicated paronychia of right middle finger, adequately drained in ED. Will start abx and have her recheck wound with PCP in 2 days.   Final Clinical Impressions(s) / ED Diagnoses   Final diagnoses:  None   1. Paronychia, right middle finger  ED Discharge Orders    None       Elpidio Anis, PA-C 12/24/17 0216    Elpidio Anis, PA-C 12/24/17 0240    Molpus, Jonny Ruiz, MD 12/24/17 [phone]

## 2017-12-24 NOTE — Discharge Instructions (Addendum)
See your doctor in 2-3 days for recheck of finger injection. Take Keflex as prescribed and ibuprofen for pain.

## 2018-01-18 ENCOUNTER — Emergency Department (HOSPITAL_COMMUNITY)
Admission: EM | Admit: 2018-01-18 | Discharge: 2018-01-18 | Disposition: A | Discharge status: Home / Self Care | Payer: Self-pay | Attending: Emergency Medicine | Admitting: Emergency Medicine

## 2018-01-18 ENCOUNTER — Emergency Department (HOSPITAL_COMMUNITY): Payer: Self-pay

## 2018-01-18 ENCOUNTER — Encounter (HOSPITAL_COMMUNITY): Payer: Self-pay

## 2018-01-18 DIAGNOSIS — Z7982 Long term (current) use of aspirin: Secondary | ICD-10-CM | POA: Insufficient documentation

## 2018-01-18 DIAGNOSIS — Z794 Long term (current) use of insulin: Secondary | ICD-10-CM | POA: Insufficient documentation

## 2018-01-18 DIAGNOSIS — R0789 Other chest pain: Secondary | ICD-10-CM | POA: Insufficient documentation

## 2018-01-18 DIAGNOSIS — E119 Type 2 diabetes mellitus without complications: Secondary | ICD-10-CM | POA: Insufficient documentation

## 2018-01-18 DIAGNOSIS — Z79899 Other long term (current) drug therapy: Secondary | ICD-10-CM | POA: Insufficient documentation

## 2018-01-18 DIAGNOSIS — I10 Essential (primary) hypertension: Secondary | ICD-10-CM | POA: Insufficient documentation

## 2018-01-18 DIAGNOSIS — F17210 Nicotine dependence, cigarettes, uncomplicated: Secondary | ICD-10-CM | POA: Insufficient documentation

## 2018-01-18 LAB — BASIC METABOLIC PANEL
Anion gap: 9 (ref 5–15)
BUN: 14 mg/dL (ref 6–20)
CHLORIDE: 105 mmol/L (ref 101–111)
CO2: 22 mmol/L (ref 22–32)
CREATININE: 0.93 mg/dL (ref 0.44–1.00)
Calcium: 9.3 mg/dL (ref 8.9–10.3)
Glucose, Bld: 255 mg/dL — ABNORMAL HIGH (ref 65–99)
Potassium: 3.8 mmol/L (ref 3.5–5.1)
SODIUM: 136 mmol/L (ref 135–145)

## 2018-01-18 LAB — CBC
HCT: 37.7 % (ref 36.0–46.0)
Hemoglobin: 12.5 g/dL (ref 12.0–15.0)
MCH: 30.9 pg (ref 26.0–34.0)
MCHC: 33.2 g/dL (ref 30.0–36.0)
MCV: 93.1 fL (ref 78.0–100.0)
PLATELETS: 187 10*3/uL (ref 150–400)
RBC: 4.05 MIL/uL (ref 3.87–5.11)
RDW: 13.1 % (ref 11.5–15.5)
WBC: 5.3 10*3/uL (ref 4.0–10.5)

## 2018-01-18 LAB — I-STAT TROPONIN, ED
TROPONIN I, POC: 0 ng/mL (ref 0.00–0.08)
Troponin i, poc: 0 ng/mL (ref 0.00–0.08)

## 2018-01-18 LAB — I-STAT BETA HCG BLOOD, ED (MC, WL, AP ONLY)

## 2018-01-18 LAB — PROTIME-INR
INR: 0.9
Prothrombin Time: 12.1 seconds (ref 11.4–15.2)

## 2018-01-18 IMAGING — DX DG CHEST 2V
2 series · 2 of 2 positions shown · non-contrast
Comparison: [DATE]

CLINICAL DATA: Chest pain

EXAM:
CHEST - 2 VIEW

[w chest lat]
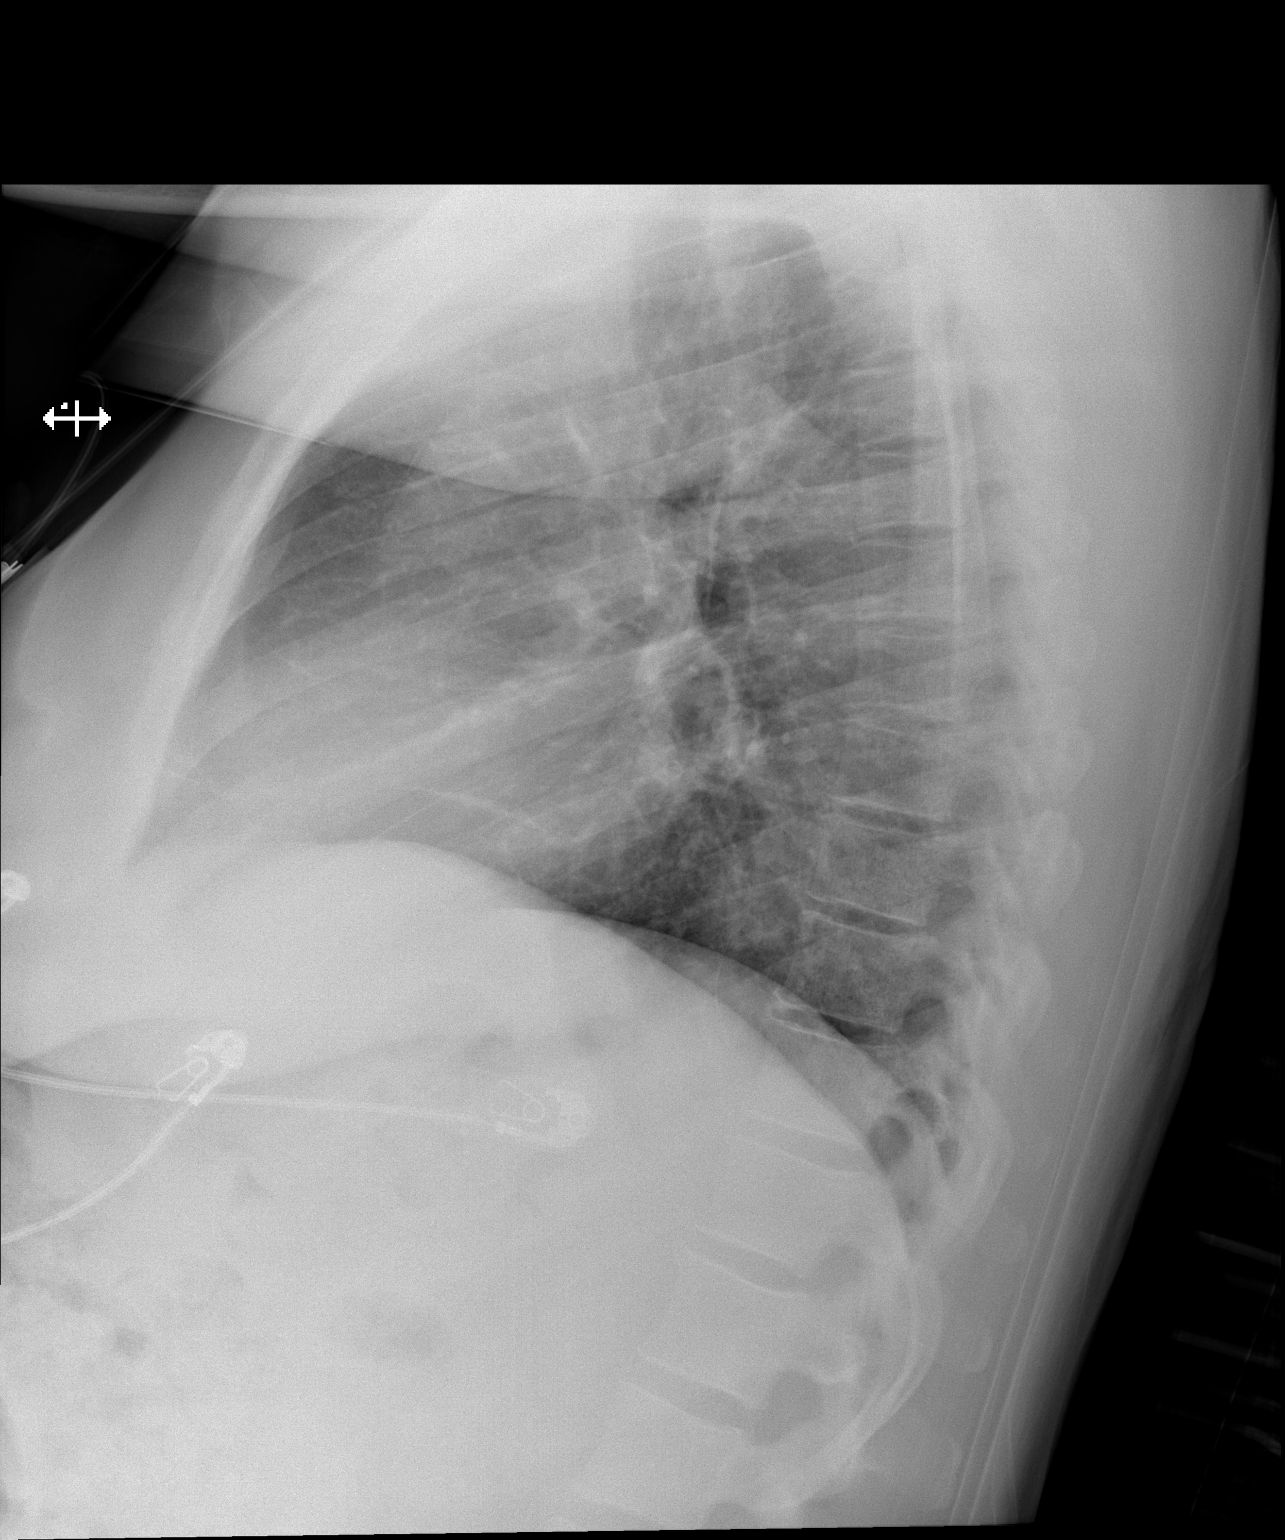

[x chest ap]
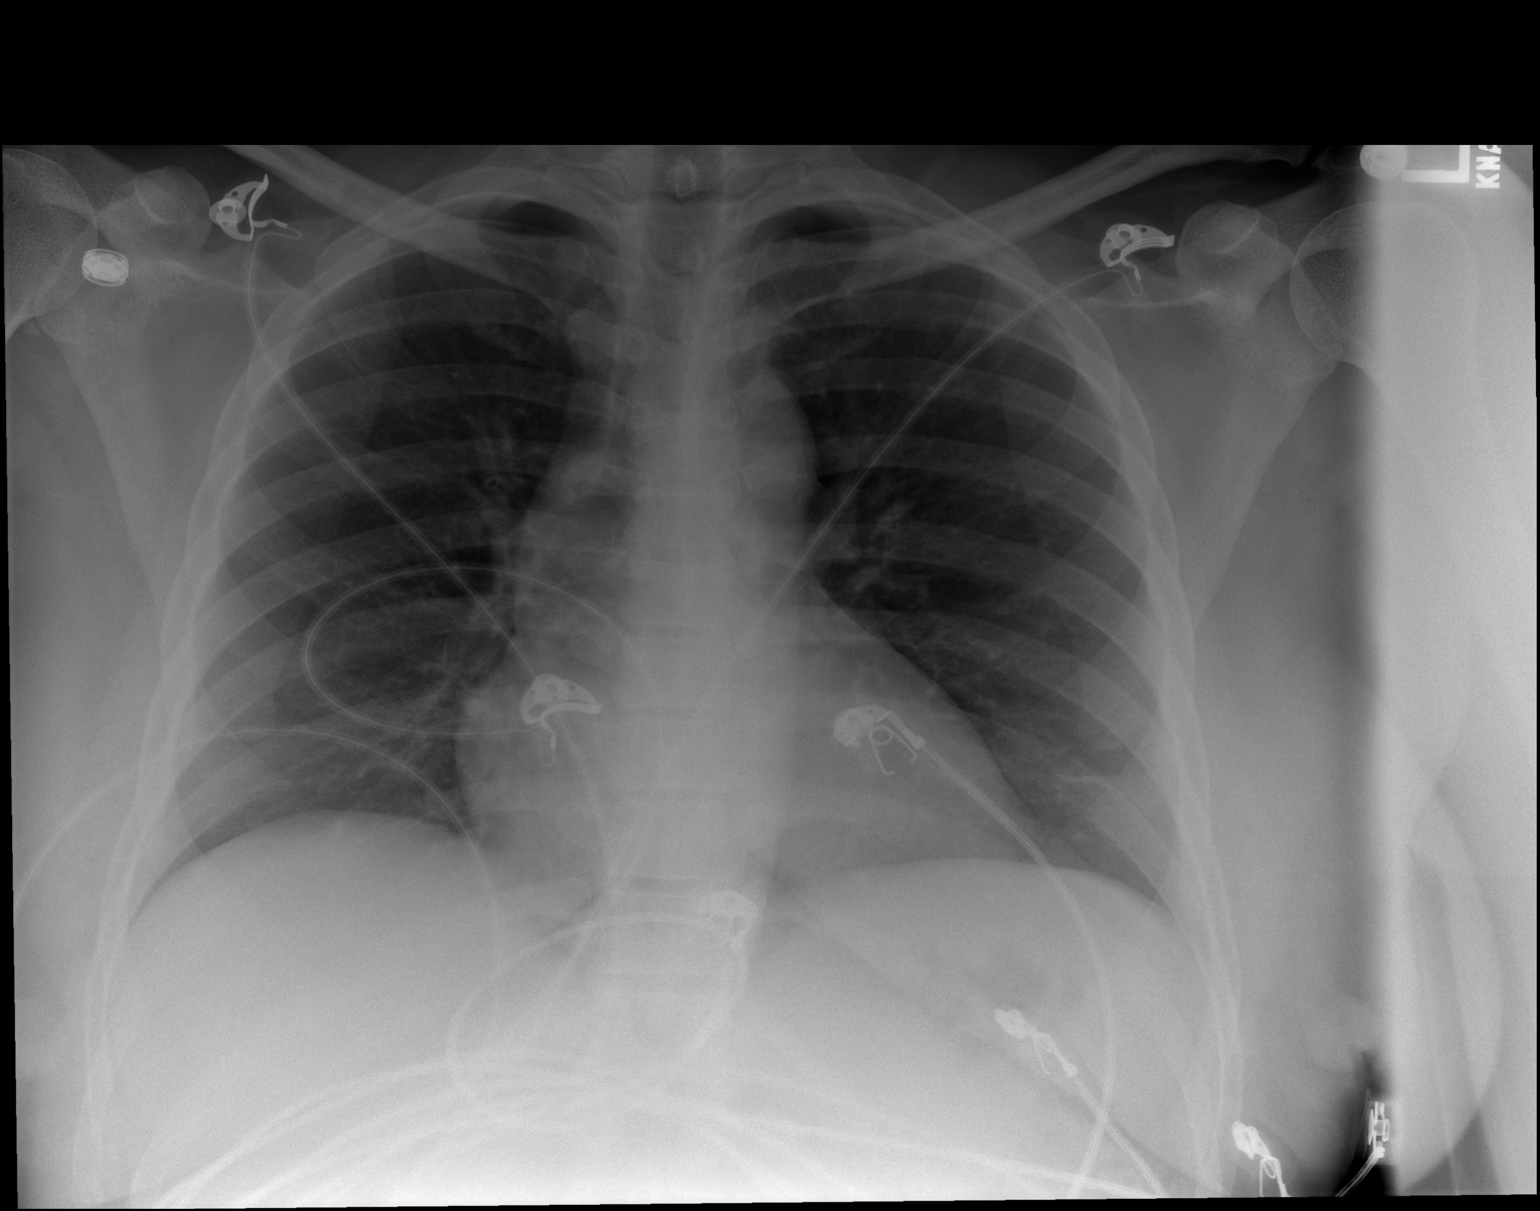

[2 of 2 positions shown; findings below may reference images not displayed]

FINDINGS: There is slight scarring in the left base. Lungs elsewhere are
clear. Heart size and pulmonary vascularity are normal. No
adenopathy. No pneumothorax. No bone lesions.
IMPRESSION: Slight scarring left base. No edema or consolidation. Stable cardiac
silhouette.

## 2018-01-18 MED ORDER — KETOROLAC TROMETHAMINE 30 MG/ML IJ SOLN
15.0000 mg | Freq: Once | INTRAMUSCULAR | Status: AC
  Administered 2018-01-18: 15 mg via INTRAVENOUS
  Filled 2018-01-18: qty 1

## 2018-01-18 MED ORDER — HYDROCODONE-ACETAMINOPHEN 5-325 MG PO TABS
1.0000 | ORAL_TABLET | Freq: Once | ORAL | Status: AC
  Administered 2018-01-18: 1 via ORAL
  Filled 2018-01-18: qty 1

## 2018-01-18 NOTE — ED Triage Notes (Signed)
Pt presents with 1 week h/o mid-sternal chest pain.  Pt reports pain lessens when she takes ASA, reports last 4 days, pain has worsened and pt is weak.  SOB and nausea; h/o MI and PE.  ASA 324mg  and NTG x 2 given PTA.

## 2018-01-18 NOTE — ED Notes (Signed)
Patient transported to X-ray 

## 2018-01-18 NOTE — ED Provider Notes (Signed)
[facility] EMERGENCY DEPARTMENT Provider Note   CSN: [id number] Arrival date & time: 01/18/18  1219     History   Chief Complaint Chief Complaint  Patient presents with  . Chest Pain    HPI Maria Harper is a 47 y.o. female.  HPI  Patient presents with concern of chest pain. She notes that she has had chest pain for a long time, but over the past day or so it has become more prominent. Pain is sternal, pressure-like, but not made worse when she is walking her 2 miles a day. There is associated generalized discomfort, ongoing left arm numbness which has been present for a long time, and nausea. No syncope, no vomiting, no diarrhea, no fever. Patient has a long history of difficulty controlling her diabetes, and states that she is not currently taking the insulin as prescribed. She is here with a female companion who assists with the HPI.   Past Medical History:  Diagnosis Date  . Acid reflux   . Diabetes mellitus without complication (HCC)   . Heart attack (HCC) 04/2016  . High cholesterol   . Hypertension     Patient Active Problem List   Diagnosis Date Noted  . Cocaine abuse with cocaine-induced mood disorder (HCC) 06/10/2017    History reviewed. No pertinent surgical history.   OB History   None      Home Medications    Prior to Admission medications   Medication Sig Start Date End Date Taking? Authorizing Provider  albuterol (VENTOLIN HFA) 108 (90 Base) MCG/ACT inhaler Inhale 2 puffs into the lungs every 6 (six) hours as needed for wheezing or shortness of breath.   Yes [provider]  ALLERGY 10 MG tablet Take 10 mg by mouth daily. 12/20/17  Yes [provider]  aspirin 81 MG chewable tablet Chew 81 mg by mouth daily.   Yes [provider]  cloNIDine (CATAPRES) 0.1 MG tablet Take 0.1 mg by mouth 2 (two) times daily. 12/20/17  Yes [provider]  clopidogrel (PLAVIX) 75 MG tablet Take 75 mg daily by  mouth.   Yes [provider]  glimepiride (AMARYL) 4 MG tablet Take 4 mg daily with breakfast by mouth.   Yes [provider]  glipiZIDE (GLUCOTROL) 5 MG tablet Take 5 mg by mouth daily. 12/20/17  Yes [provider]  IBU 800 MG tablet Take 800 mg by mouth 3 (three) times daily as needed. 12/20/17  Yes [provider]  insulin NPH-regular Human (HUMULIN 70/30) (70-30) 100 UNIT/ML injection Inject 30 Units into the skin 2 (two) times daily. 07/01/17  Yes [provider]  pantoprazole (PROTONIX) 40 MG tablet Take 40 mg daily by mouth.   Yes [provider]  cephALEXin (KEFLEX) 500 MG capsule Take 1 capsule (500 mg total) by mouth 4 (four) times daily. Patient not taking: Reported on 01/18/2018 12/24/17   Elpidio Anis, PA-C    Family History History reviewed. No pertinent family history.  Social History Social History   Tobacco Use  . Smoking status: Current Every Day Smoker    Packs/day: 1.50    Years: 20.00    Pack years: 30.00    Types: Cigarettes  . Smokeless tobacco: Never Used  Substance Use Topics  . Alcohol use: Yes    Comment: "occasion wine cooler on a holiday"   . Drug use: Not Currently    Comment: last used yesterday. States " I do it everyday"  Allergies   Contrast media [iodinated diagnostic agents] and Tomato   Review of Systems Review of Systems  Constitutional:       Per HPI, otherwise negative  HENT:       Per HPI, otherwise negative  Respiratory:       Per HPI, otherwise negative  Cardiovascular:       Per HPI, otherwise negative  Gastrointestinal: Positive for nausea. Negative for vomiting.  Endocrine:       Negative aside from HPI  Genitourinary:       Neg aside from HPI   Musculoskeletal:       Per HPI, otherwise negative  Skin: Negative.   Neurological: Positive for numbness. Negative for syncope.     Physical Exam Updated Vital Signs BP (!) 160/88 (BP Location: Left Arm)   Pulse  88   Temp 98.6 F (37 C) (Oral)   Resp 18   Ht 5\' 2"  (1.575 m)   Wt 72.6 kg (160 lb)   LMP 01/05/2018 (Exact Date)   SpO2 100%   BMI 29.26 kg/m   Physical Exam  Constitutional: She is oriented to person, place, and time. She appears well-developed and well-nourished. No distress.  HENT:  Head: Normocephalic and atraumatic.  Eyes: Conjunctivae and EOM are normal.  Cardiovascular: Normal rate and regular rhythm.  Pulmonary/Chest: Effort normal and breath sounds normal. No stridor. No respiratory distress.  Abdominal: She exhibits no distension.  Musculoskeletal: She exhibits no edema.  Neurological: She is alert and oriented to person, place, and time. No cranial nerve deficit.  Skin: Skin is warm and dry.  Psychiatric: She has a normal mood and affect.  Nursing note and vitals reviewed.    ED Treatments / Results  Labs (all labs ordered are listed, but only abnormal results are displayed) Labs Reviewed  BASIC METABOLIC PANEL - Abnormal; Notable for the following components:      Result Value   Glucose, Bld 255 (*)    All other components within normal limits  CBC  PROTIME-INR  I-STAT TROPONIN, ED  I-STAT BETA HCG BLOOD, ED ([facility], [facility], [facility] ONLY)  I-STAT TROPONIN, ED    EKG EKG Interpretation  Date/Time:  Wednesday January 18 2018 12:25:58 EDT Ventricular Rate:  80 PR Interval:    QRS Duration: 93 QT Interval:  372 QTC Calculation: 430 R Axis:   -13 Text Interpretation:  Sinus rhythm Probable left atrial enlargement Borderline T abnormalities, inferior leads No significant change since last tracing Abnormal ekg Confirmed by Gerhard Munch [phone]) on 01/18/2018 12:34:38 PM   Radiology Dg Chest 2 View  Result Date: 01/18/2018 CLINICAL DATA:  Chest pain EXAM: CHEST - 2 VIEW COMPARISON:  November 24, 2017 FINDINGS: There is slight scarring in the left base. Lungs elsewhere are clear. Heart size and pulmonary vascularity are normal. No adenopathy. No pneumothorax. No bone  lesions. IMPRESSION: Slight scarring left base. No edema or consolidation. Stable cardiac silhouette. Electronically Signed   By: Bretta Bang III M.D.   On: 01/18/2018 13:21    Procedures Procedures (including critical care time)  Medications Ordered in ED Medications  ketorolac (TORADOL) 30 MG/ML injection 15 mg (15 mg Intravenous Given 01/18/18 1415)  HYDROcodone-acetaminophen (NORCO/VICODIN) 5-325 MG per tablet 1 tablet (1 tablet Oral Given 01/18/18 1649)     Initial Impression / Assessment and Plan / ED Course  I have reviewed the triage vital signs and the nursing notes.  Pertinent labs & imaging results that were available during my care of  the patient were reviewed by me and considered in my medical decision making (see chart for details).     6:03 PM Patient in no distress, ambulatory. I had multiple conversations with her and her female companion during her evaluation. Here she has had 2 normal troponins, has a nonischemic EKG, has an unremarkable chest x-ray. She does have some chest pain, though it is been present for weeks to months, as well as her subjective numbness in her arm. No evidence for stroke, ACS, ongoing infection, and given these reassuring findings, chronicity of her complaints, she is discharged with instructions to begin taking her medication as previously instructed, insulin and antihypertensives. Patient also provided return precautions, and discharged in stable condition.  Final Clinical Impressions(s) / ED Diagnoses   Final diagnoses:  Atypical chest pain      Gerhard Munch, MD 01/18/18 1807

## 2018-01-18 NOTE — Discharge Instructions (Addendum)
As discussed, your evaluation today has been largely reassuring.  But, it is important that you monitor your condition carefully, and do not hesitate to return to the ED if you develop new, or concerning changes in your condition. ? ?Otherwise, please follow-up with your physician for appropriate ongoing care. ? ?

## 2018-02-20 ENCOUNTER — Emergency Department (HOSPITAL_COMMUNITY)
Admission: EM | Admit: 2018-02-20 | Discharge: 2018-02-20 | Disposition: A | Discharge status: Home / Self Care | Payer: Self-pay | Attending: Emergency Medicine | Admitting: Emergency Medicine

## 2018-02-20 ENCOUNTER — Encounter (HOSPITAL_COMMUNITY): Payer: Self-pay | Admitting: *Deleted

## 2018-02-20 ENCOUNTER — Other Ambulatory Visit: Payer: Self-pay

## 2018-02-20 ENCOUNTER — Emergency Department (HOSPITAL_COMMUNITY): Payer: Self-pay

## 2018-02-20 DIAGNOSIS — Z794 Long term (current) use of insulin: Secondary | ICD-10-CM | POA: Insufficient documentation

## 2018-02-20 DIAGNOSIS — I10 Essential (primary) hypertension: Secondary | ICD-10-CM | POA: Insufficient documentation

## 2018-02-20 DIAGNOSIS — Z79899 Other long term (current) drug therapy: Secondary | ICD-10-CM | POA: Insufficient documentation

## 2018-02-20 DIAGNOSIS — R739 Hyperglycemia, unspecified: Secondary | ICD-10-CM

## 2018-02-20 DIAGNOSIS — Z7902 Long term (current) use of antithrombotics/antiplatelets: Secondary | ICD-10-CM | POA: Insufficient documentation

## 2018-02-20 DIAGNOSIS — Z7982 Long term (current) use of aspirin: Secondary | ICD-10-CM | POA: Insufficient documentation

## 2018-02-20 DIAGNOSIS — K0381 Cracked tooth: Secondary | ICD-10-CM | POA: Insufficient documentation

## 2018-02-20 DIAGNOSIS — F17210 Nicotine dependence, cigarettes, uncomplicated: Secondary | ICD-10-CM | POA: Insufficient documentation

## 2018-02-20 DIAGNOSIS — Z9114 Patient's other noncompliance with medication regimen: Secondary | ICD-10-CM | POA: Insufficient documentation

## 2018-02-20 DIAGNOSIS — K047 Periapical abscess without sinus: Secondary | ICD-10-CM | POA: Insufficient documentation

## 2018-02-20 DIAGNOSIS — E1165 Type 2 diabetes mellitus with hyperglycemia: Secondary | ICD-10-CM | POA: Insufficient documentation

## 2018-02-20 LAB — CBC WITH DIFFERENTIAL/PLATELET
BASOS PCT: 0 %
Basophils Absolute: 0 10*3/uL (ref 0.0–0.1)
EOS ABS: 0.1 10*3/uL (ref 0.0–0.7)
EOS PCT: 1 %
HCT: 36.5 % (ref 36.0–46.0)
Hemoglobin: 12.4 g/dL (ref 12.0–15.0)
Lymphocytes Relative: 16 %
Lymphs Abs: 1.6 10*3/uL (ref 0.7–4.0)
MCH: 31.5 pg (ref 26.0–34.0)
MCHC: 34 g/dL (ref 30.0–36.0)
MCV: 92.6 fL (ref 78.0–100.0)
MONO ABS: 0.8 10*3/uL (ref 0.1–1.0)
Monocytes Relative: 8 %
Neutro Abs: 7.4 10*3/uL (ref 1.7–7.7)
Neutrophils Relative %: 75 %
PLATELETS: 193 10*3/uL (ref 150–400)
RBC: 3.94 MIL/uL (ref 3.87–5.11)
RDW: 13.5 % (ref 11.5–15.5)
WBC: 9.8 10*3/uL (ref 4.0–10.5)

## 2018-02-20 LAB — I-STAT CHEM 8, ED
BUN: 13 mg/dL (ref 6–20)
CALCIUM ION: 1.19 mmol/L (ref 1.15–1.40)
Chloride: 100 mmol/L (ref 98–111)
Creatinine, Ser: 1.1 mg/dL — ABNORMAL HIGH (ref 0.44–1.00)
GLUCOSE: 376 mg/dL — AB (ref 70–99)
HCT: 38 % (ref 36.0–46.0)
HEMOGLOBIN: 12.9 g/dL (ref 12.0–15.0)
POTASSIUM: 3.8 mmol/L (ref 3.5–5.1)
SODIUM: 133 mmol/L — AB (ref 135–145)
TCO2: 21 mmol/L — ABNORMAL LOW (ref 22–32)

## 2018-02-20 LAB — CBG MONITORING, ED
GLUCOSE-CAPILLARY: 326 mg/dL — AB (ref 70–99)
Glucose-Capillary: 374 mg/dL — ABNORMAL HIGH (ref 70–99)

## 2018-02-20 LAB — POC URINE PREG, ED: PREG TEST UR: NEGATIVE

## 2018-02-20 IMAGING — CT CT MAXILLOFACIAL W/O CM
3 series · 16 of 47 positions shown, 19 images · non-contrast
Comparison: None.

CLINICAL DATA: 47-year-old female with left facial pain and
swelling. Initial encounter.

EXAM:
CT MAXILLOFACIAL WITHOUT CONTRAST
TECHNIQUE: Multidetector CT imaging of the maxillofacial structures was
performed. Multiplanar CT image reconstructions were also generated.

[Series 3: max soft · axial · 0.33mm/px · z∈[-238,-106]mm · 10 of 78 slices shown, 13 images]
[im 6/78  brain]
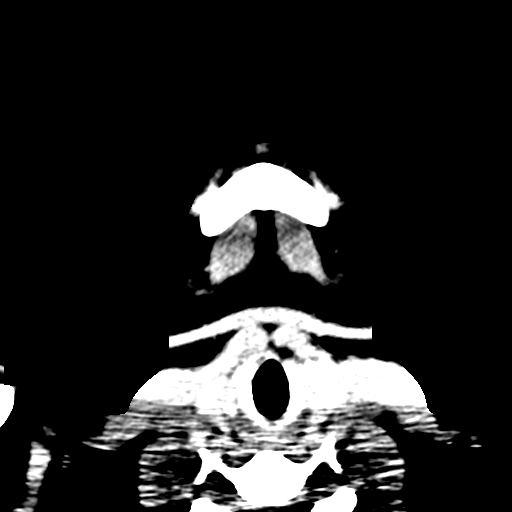
[im 6/78  bone]
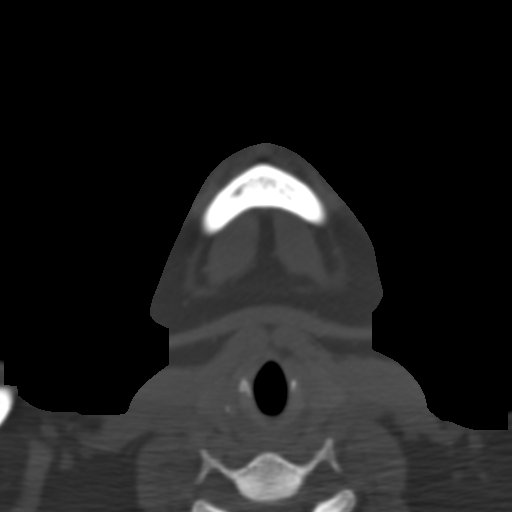
[im 14/78  bone]
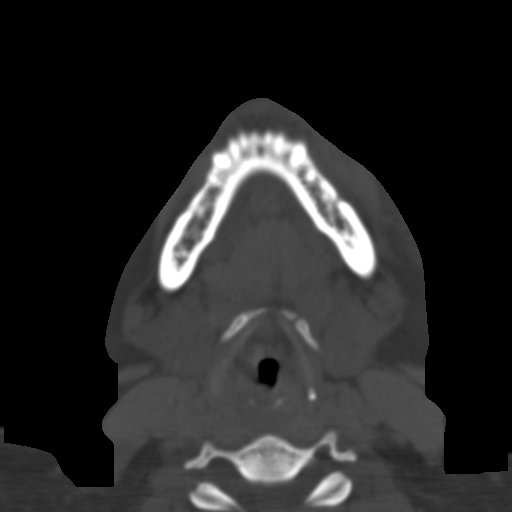
[im 22/78  bone]
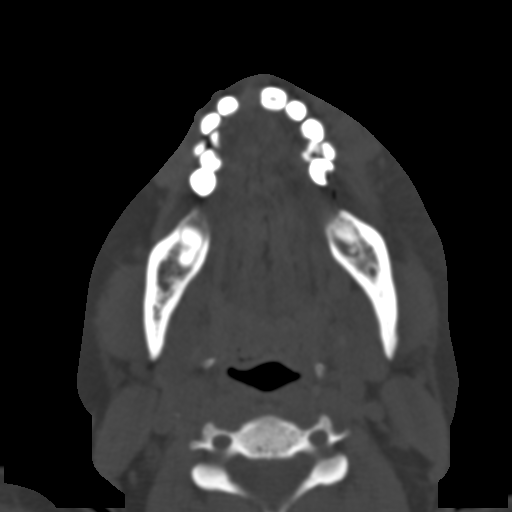
[im 27/78  bone]
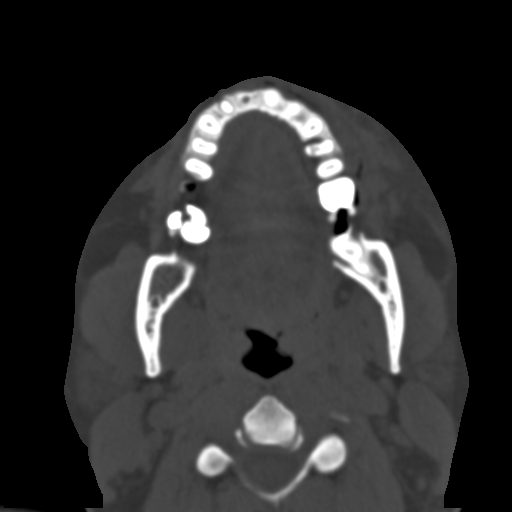
[im 35/78  brain]
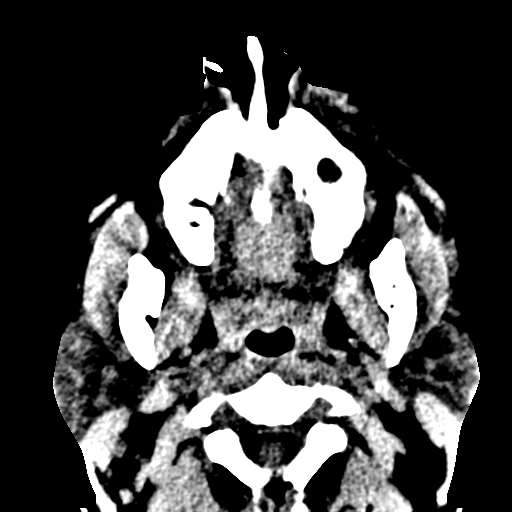
[im 35/78  bone]
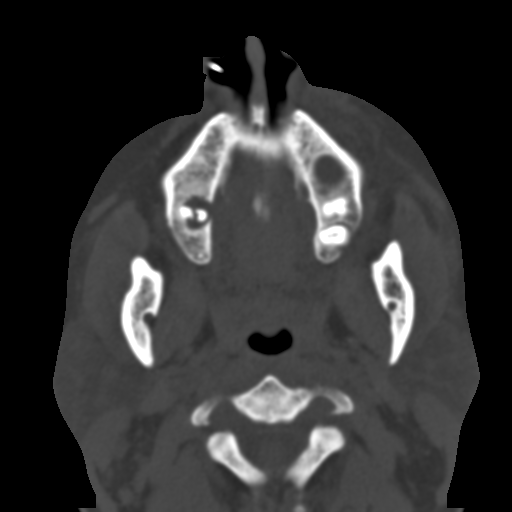
[im 43/78  bone]
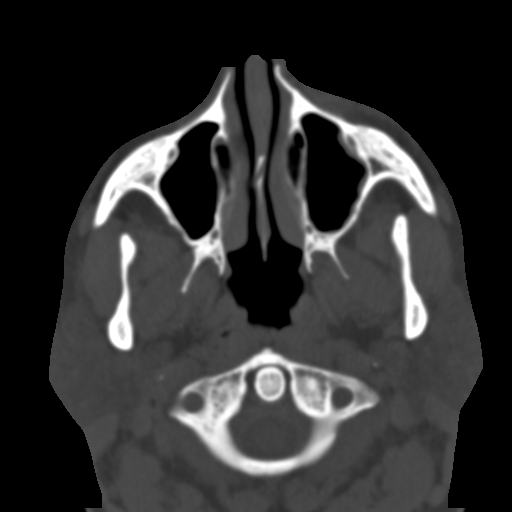
[im 51/78  bone]
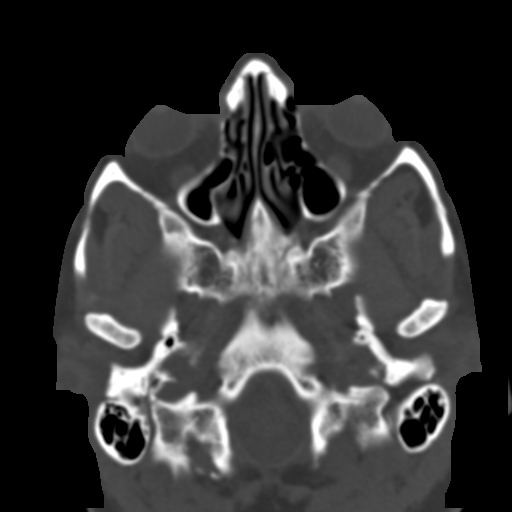
[im 59/78  bone]
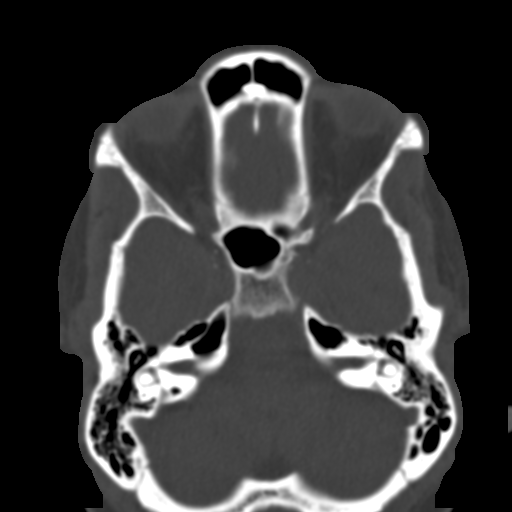
[im 64/78  brain]
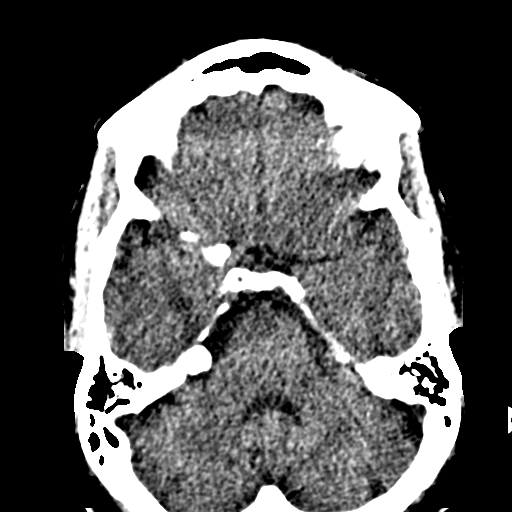
[im 64/78  bone]
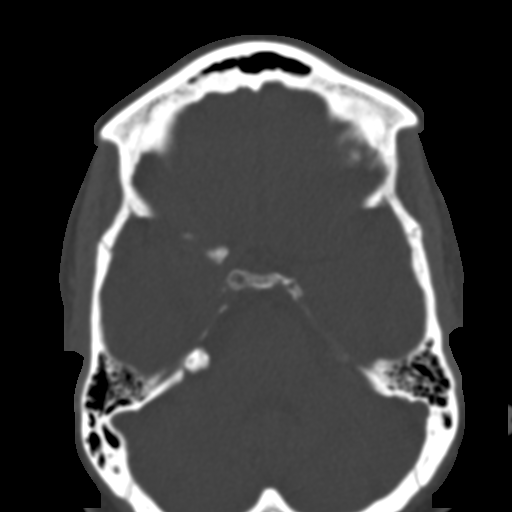
[im 72/78  bone]
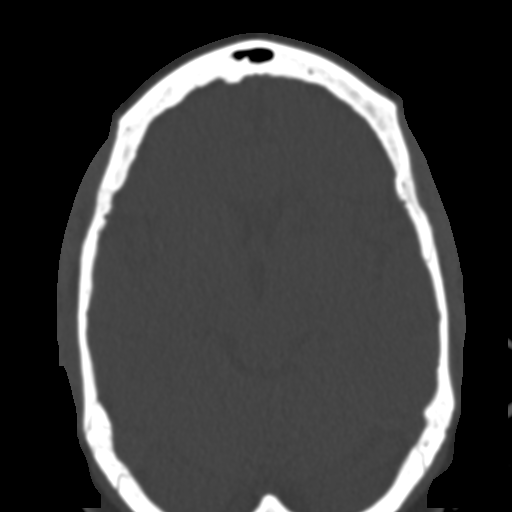

[Series 5: coronal soft · coronal · 0.31mm/px · 3 of 66 slices shown]
[im 22/66  bone]
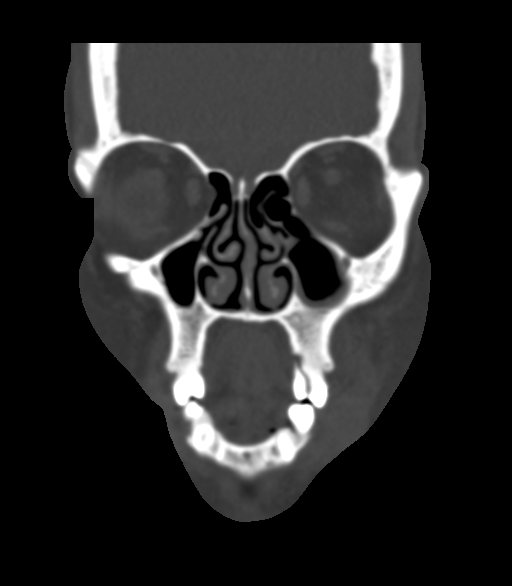
[im 29/66  bone]
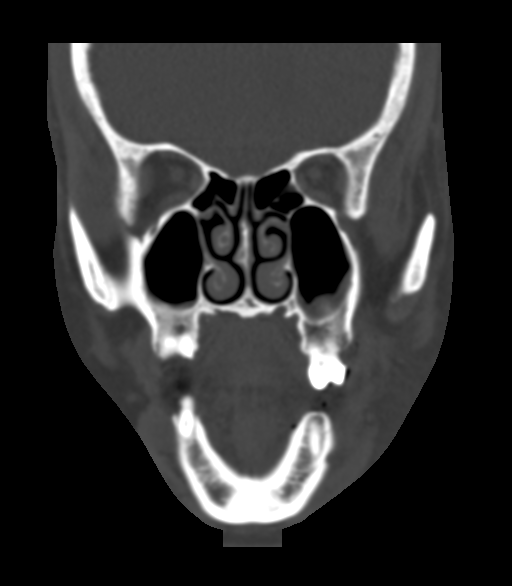
[im 37/66  bone]
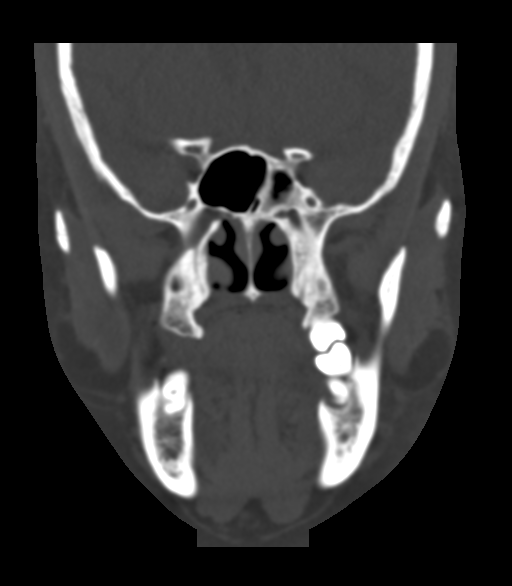

[Series 6: sagittal soft · sagittal · 0.31mm/px · 3 of 75 slices shown]
[im 25/75  bone]
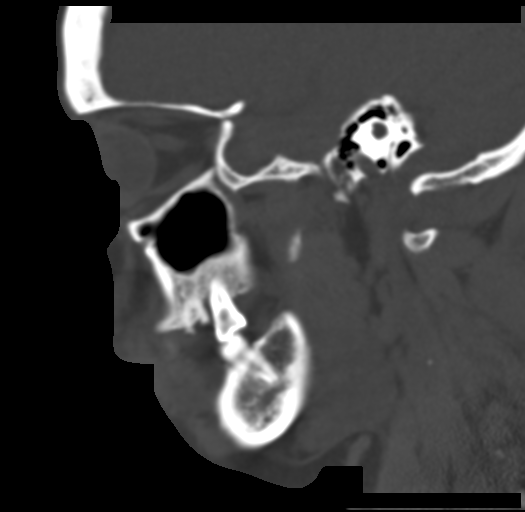
[im 38/75  bone]
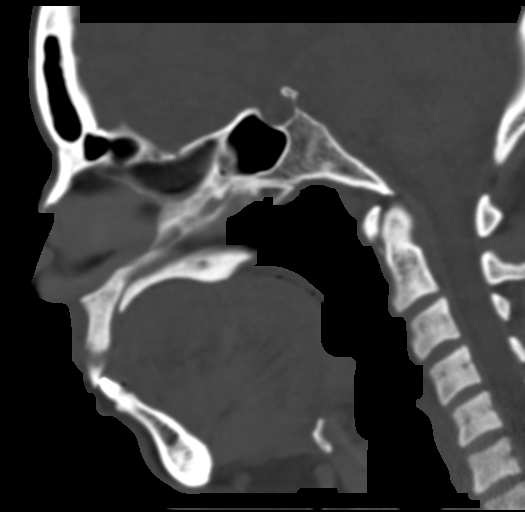
[im 50/75  bone]
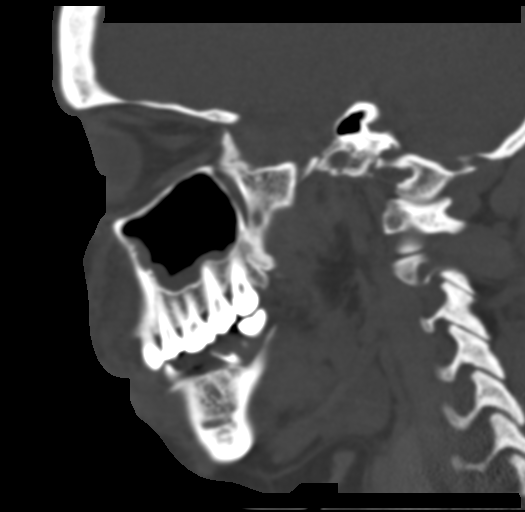

[16 of 47 positions shown; findings below may reference images not displayed]

FINDINGS: Osseous: Significant dental disease with multiple caries and areas
of apical lucency consistent with infection/loosening. Fracture of
the upper left first premolar with inflammatory process extending
from this region into the buccal region causing left facial/cheek
cellulitis.

Orbits: No acute abnormality. Evidence of remote right medial
orbital wall fracture.

Sinuses: Mucosal thickening maxillary sinuses greater on left
measuring up to 6.5 mm. Bubbly opacification left sphenoid sinus.
Mastoid air cells and middle ear cavities are clear.

Soft tissues: Left facial/cheek cellulitis as noted above. Reactive
lymph nodes greater on the left and submental region.

Limited intracranial: No acute abnormality.
IMPRESSION: Significant dental disease with multiple caries and areas of apical
lucency consistent with infection/loosening. Fracture of the upper
left first premolar with inflammatory process extending from this
region into the buccal region causing left facial/cheek cellulitis.
Reactive lymph nodes greater on the left and submental region.

Mucosal thickening maxillary sinuses greater on left measuring up to
6.5 mm.

## 2018-02-20 MED ORDER — INSULIN ASPART 100 UNIT/ML ~~LOC~~ SOLN
5.0000 [IU] | Freq: Once | SUBCUTANEOUS | Status: DC
  Filled 2018-02-20: qty 1

## 2018-02-20 MED ORDER — CLINDAMYCIN HCL 150 MG PO CAPS: 150.0000 mg | ORAL_CAPSULE | Freq: Four times a day (QID) | ORAL | 0 refills | Status: DC

## 2018-02-20 MED ORDER — SODIUM CHLORIDE 0.9 % IV BOLUS
1000.0000 mL | Freq: Once | INTRAVENOUS | Status: AC
  Administered 2018-02-20: 1000 mL via INTRAVENOUS

## 2018-02-20 MED ORDER — DEXAMETHASONE SODIUM PHOSPHATE 10 MG/ML IJ SOLN
10.0000 mg | Freq: Once | INTRAMUSCULAR | Status: AC
  Administered 2018-02-20: 10 mg via INTRAVENOUS
  Filled 2018-02-20: qty 1

## 2018-02-20 MED ORDER — CLINDAMYCIN PHOSPHATE 600 MG/50ML IV SOLN
600.0000 mg | Freq: Once | INTRAVENOUS | Status: AC
  Administered 2018-02-20: 600 mg via INTRAVENOUS
  Filled 2018-02-20: qty 50

## 2018-02-20 MED ORDER — IBUPROFEN 600 MG PO TABS: 600.0000 mg | ORAL_TABLET | Freq: Four times a day (QID) | ORAL | 0 refills | Status: DC | PRN

## 2018-02-20 MED ORDER — HYDROCODONE-ACETAMINOPHEN 5-325 MG PO TABS: 1.0000 | ORAL_TABLET | Freq: Four times a day (QID) | ORAL | 0 refills | Status: DC | PRN

## 2018-02-20 MED ORDER — MORPHINE SULFATE (PF) 4 MG/ML IV SOLN
4.0000 mg | Freq: Once | INTRAVENOUS | Status: AC
  Administered 2018-02-20: 4 mg via INTRAVENOUS
  Filled 2018-02-20: qty 1

## 2018-02-20 NOTE — Discharge Instructions (Signed)
Please take antibiotic as prescribed.  Call and follow-up with dentist in the next 4 days for further management of your condition.  Return if the condition worsen.  Make sure to take your diabetic medication as her blood sugar is high today.

## 2018-02-20 NOTE — ED Triage Notes (Signed)
Pt c/o facial swelling since Friday.  Pt stated "I have a cracked tooth on the upper.."  Pt presents with edema to left face.

## 2018-02-20 NOTE — ED Provider Notes (Signed)
[facility]-EMERGENCY DEPT Provider Note   CSN: [id number] Arrival date & time: 02/20/18  8657     History   Chief Complaint Chief Complaint  Patient presents with  . Dental Pain    HPI Annika Selke is a 47 y.o. female.  The history is provided by the patient. No language interpreter was used.  Dental Pain       47 year old female with history of cocaine abuse, diabetes, prior MI, hypertension presenting for evaluation of dental pain.  Patient report progressive worsening left upper dental pain ongoing for the past 4 to 5 days with associated facial swelling.  Pain is described as achy throbbing, persistent, moderate to severe, kept her Up at night, now with associated headache.  Pain not adequately relieved using warm compress.  Is having difficulty eating due to the pain.  No report of fever, chills, neck pain, trouble swallowing, chest pain or shortness of breath.  She has a contrast via allergies.  She reports she cracked the affected tooth "a while back" which never really bothers her until now.  Past Medical History:  Diagnosis Date  . Acid reflux   . Diabetes mellitus without complication (HCC)   . Heart attack (HCC) 04/2016  . High cholesterol   . Hypertension     Patient Active Problem List   Diagnosis Date Noted  . Cocaine abuse with cocaine-induced mood disorder (HCC) 06/10/2017    History reviewed. No pertinent surgical history.   OB History   None      Home Medications    Prior to Admission medications   Medication Sig Start Date End Date Taking? Authorizing Provider  albuterol (VENTOLIN HFA) 108 (90 Base) MCG/ACT inhaler Inhale 2 puffs into the lungs every 6 (six) hours as needed for wheezing or shortness of breath.    [provider]  ALLERGY 10 MG tablet Take 10 mg by mouth daily. 12/20/17   [provider]  aspirin 81 MG chewable tablet Chew 81 mg by mouth daily.    [provider]  cephALEXin  (KEFLEX) 500 MG capsule Take 1 capsule (500 mg total) by mouth 4 (four) times daily. Patient not taking: Reported on 01/18/2018 12/24/17   Elpidio Anis, PA-C  cloNIDine (CATAPRES) 0.1 MG tablet Take 0.1 mg by mouth 2 (two) times daily. 12/20/17   [provider]  clopidogrel (PLAVIX) 75 MG tablet Take 75 mg daily by mouth.    [provider]  glimepiride (AMARYL) 4 MG tablet Take 4 mg daily with breakfast by mouth.    [provider]  glipiZIDE (GLUCOTROL) 5 MG tablet Take 5 mg by mouth daily. 12/20/17   [provider]  IBU 800 MG tablet Take 800 mg by mouth 3 (three) times daily as needed. 12/20/17   [provider]  insulin NPH-regular Human (HUMULIN 70/30) (70-30) 100 UNIT/ML injection Inject 30 Units into the skin 2 (two) times daily. 07/01/17   [provider]  pantoprazole (PROTONIX) 40 MG tablet Take 40 mg daily by mouth.    [provider]    Family History No family history on file.  Social History Social History   Tobacco Use  . Smoking status: Current Every Day Smoker    Packs/day: 1.50    Years: 20.00    Pack years: 30.00    Types: Cigarettes  . Smokeless tobacco: Never Used  Substance Use Topics  . Alcohol use: Not Currently  . Drug use: Not Currently    Comment:  last used yesterday. States " I do it everyday"      Allergies   Contrast media [iodinated diagnostic agents] and Tomato   Review of Systems Review of Systems  All other systems reviewed and are negative.    Physical Exam Updated Vital Signs BP 121/83 (BP Location: Right Arm)   Pulse 100   Temp 98.2 F (36.8 C) (Oral)   Ht 5\' 2"  (1.575 m)   Wt 62.1 kg (137 lb)   LMP 02/19/2018 (Exact Date)   SpO2 100%   BMI 25.06 kg/m   Physical Exam  Constitutional: She appears well-developed and well-nourished. No distress.  HENT:  Head: Atraumatic.  Mouth: Tenderness to tooth #12 and 13 with associated adjacent facial swelling and trismus.   Old dental fracture to tooth 13.  Eyes: Conjunctivae are normal.  Neck: Neck supple.  Neurological: She is alert.  Skin: No rash noted.  Psychiatric: She has a normal mood and affect.  Nursing note and vitals reviewed.    ED Treatments / Results  Labs (all labs ordered are listed, but only abnormal results are displayed) Labs Reviewed  I-STAT CHEM 8, ED - Abnormal; Notable for the following components:      Result Value   Sodium 133 (*)    Creatinine, Ser 1.10 (*)    Glucose, Bld 376 (*)    TCO2 21 (*)    All other components within normal limits  CBG MONITORING, ED - Abnormal; Notable for the following components:   Glucose-Capillary 326 (*)    All other components within normal limits  CBC WITH DIFFERENTIAL/PLATELET  POC URINE PREG, ED    EKG None  Radiology Ct Maxillofacial Wo Contrast  Result Date: 02/20/2018 CLINICAL DATA:  47 year old female with left facial pain and swelling. Initial encounter. EXAM: CT MAXILLOFACIAL WITHOUT CONTRAST TECHNIQUE: Multidetector CT imaging of the maxillofacial structures was performed. Multiplanar CT image reconstructions were also generated. COMPARISON:  None. FINDINGS: Osseous: Significant dental disease with multiple caries and areas of apical lucency consistent with infection/loosening. Fracture of the upper left first premolar with inflammatory process extending from this region into the buccal region causing left facial/cheek cellulitis. Orbits: No acute abnormality. Evidence of remote right medial orbital wall fracture. Sinuses: Mucosal thickening maxillary sinuses greater on left measuring up to 6.5 mm. Bubbly opacification left sphenoid sinus. Mastoid air cells and middle ear cavities are clear. Soft tissues: Left facial/cheek cellulitis as noted above. Reactive lymph nodes greater on the left and submental region. Limited intracranial: No acute abnormality. IMPRESSION: Significant dental disease with multiple caries and areas of apical  lucency consistent with infection/loosening. Fracture of the upper left first premolar with inflammatory process extending from this region into the buccal region causing left facial/cheek cellulitis. Reactive lymph nodes greater on the left and submental region. Mucosal thickening maxillary sinuses greater on left measuring up to 6.5 mm. Electronically Signed   By: Lacy Duverney M.D.   On: 02/20/2018 07:35    Procedures Procedures (including critical care time)  EMERGENCY DEPARTMENT  US GUIDANCE EXAM Emergency Ultrasound:  US Guidance for Needle Guidance  INDICATIONS: Difficult vascular access Linear probe used in real-time to visualize location of needle entry through skin.   PERFORMED BY: Myself IMAGES ARCHIVED?: No LIMITATIONS: Pain VIEWS USED: Transverse INTERPRETATION: Right arm and Needle gauge 20   Medications Ordered in ED Medications  insulin aspart (novoLOG) injection 5 Units (5 Units Subcutaneous Refused 02/20/18 0950)  sodium chloride 0.9 % bolus 1,000 mL (1,000 mLs Intravenous New  Bag/Given 02/20/18 1010)  clindamycin (CLEOCIN) IVPB 600 mg (0 mg Intravenous Stopped 02/20/18 0950)  dexamethasone (DECADRON) injection 10 mg (10 mg Intravenous Given 02/20/18 0816)  morphine 4 MG/ML injection 4 mg (4 mg Intravenous Given 02/20/18 0817)     Initial Impression / Assessment and Plan / ED Course  I have reviewed the triage vital signs and the nursing notes.  Pertinent labs & imaging results that were available during my care of the patient were reviewed by me and considered in my medical decision making (see chart for details).     BP 121/83 (BP Location: Right Arm)   Pulse 100   Temp 98.2 F (36.8 C) (Oral)   Ht 5\' 2"  (1.575 m)   Wt 62.1 kg (137 lb)   LMP 02/19/2018 (Exact Date)   SpO2 100%   BMI 25.06 kg/m    Final Clinical Impressions(s) / ED Diagnoses   Final diagnoses:  Periapical abscess with facial involvement  Hyperglycemia    ED Discharge Orders         Ordered    clindamycin (CLEOCIN) 150 MG capsule  Every 6 hours     02/20/18 1038    ibuprofen (ADVIL,MOTRIN) 600 MG tablet  Every 6 hours PRN     02/20/18 1038     7:41 AM Patient here with left-sided dental pain and now facial swelling.  She is having difficulty opening up her jaw for my examination.  Special facial CT scan demonstrate significant dental disease with multiple caries and areas of apical lucency consistent with infection/loosening.  Fracture of the upper left first premolar with inflammatory process extending from this region to the buccal region causing left facial/cheek cellulitis no evidence of abscess.  Patient currently receiving IV antibiotic as well as steroid to help with her condition.  Pain medication given.  8:05 AM Nurse report difficult IV access therefore ultrasound-guided IV access was placed by me.  10:35 AM Patient was found to be hyperglycemic with initial CBG of 376.  She was given insulin and IV fluid with some improvement.  Patient admits that she is insulin-dependent but have not been taking her diabetic medication as prescribed.  She does have medication available and agrees to take her medication to monitor her blood sugar more closely.  Patient discharged home with antibiotic, and dental referral.  Return precautions discussed. In order to decrease risk of narcotic abuse. Pt's record were checked using the [place] Controlled Substance database.     Fayrene Helper, PA-C 02/20/18 1043    Molpus, Jonny Ruiz, MD 02/20/18 2235

## 2018-02-20 NOTE — ED Notes (Signed)
Attempted to Ultrasound IV, patient states it "hurts too much." IV removed. RN made aware.

## 2018-02-20 NOTE — ED Notes (Signed)
PA states he wants pt to finish fluid bolus before discharge.

## 2018-06-10 ENCOUNTER — Encounter (HOSPITAL_COMMUNITY): Payer: Self-pay | Admitting: *Deleted

## 2018-06-10 ENCOUNTER — Emergency Department (HOSPITAL_COMMUNITY)
Admission: EM | Admit: 2018-06-10 | Discharge: 2018-06-10 | Disposition: A | Discharge status: Home / Self Care | Payer: Self-pay | Attending: Emergency Medicine | Admitting: Emergency Medicine

## 2018-06-10 DIAGNOSIS — E119 Type 2 diabetes mellitus without complications: Secondary | ICD-10-CM | POA: Insufficient documentation

## 2018-06-10 DIAGNOSIS — G8929 Other chronic pain: Secondary | ICD-10-CM | POA: Insufficient documentation

## 2018-06-10 DIAGNOSIS — Z794 Long term (current) use of insulin: Secondary | ICD-10-CM | POA: Insufficient documentation

## 2018-06-10 DIAGNOSIS — M25512 Pain in left shoulder: Secondary | ICD-10-CM | POA: Insufficient documentation

## 2018-06-10 DIAGNOSIS — I10 Essential (primary) hypertension: Secondary | ICD-10-CM | POA: Insufficient documentation

## 2018-06-10 DIAGNOSIS — F17210 Nicotine dependence, cigarettes, uncomplicated: Secondary | ICD-10-CM | POA: Insufficient documentation

## 2018-06-10 DIAGNOSIS — Z79899 Other long term (current) drug therapy: Secondary | ICD-10-CM | POA: Insufficient documentation

## 2018-06-10 MED ORDER — METHOCARBAMOL 500 MG PO TABS: 500.0000 mg | ORAL_TABLET | Freq: Three times a day (TID) | ORAL | 0 refills | Status: DC | PRN

## 2018-06-10 NOTE — ED Provider Notes (Signed)
[facility]-EMERGENCY DEPT Provider Note   CSN: [id number] Arrival date & time: 06/10/18  2841     History   Chief Complaint Chief Complaint  Patient presents with  . Arm Pain    HPI Maria Harper is a 47 y.o. female with a hx of DM, HTN, and hypercholesterolemia who presents to the ED with LUE pain for the past 4 months. Patient states pain is to the shoulder and the upper arm. Pain is fairly constant, worse with movement, mildly alleviated by ibuprofen. No specific injury or change in activity. Denies fever, chills, color change, swelling, tingling, or numbness.    HPI  Past Medical History:  Diagnosis Date  . Acid reflux   . Diabetes mellitus without complication (HCC)   . Heart attack (HCC) 04/2016  . High cholesterol   . Hypertension     Patient Active Problem List   Diagnosis Date Noted  . Cocaine abuse with cocaine-induced mood disorder (HCC) 06/10/2017    History reviewed. No pertinent surgical history.   OB History   None      Home Medications    Prior to Admission medications   Medication Sig Start Date End Date Taking? Authorizing Provider  albuterol (VENTOLIN HFA) 108 (90 Base) MCG/ACT inhaler Inhale 2 puffs into the lungs every 6 (six) hours as needed for wheezing or shortness of breath.    [provider]  ALLERGY 10 MG tablet Take 10 mg by mouth daily. 12/20/17   [provider]  aspirin 81 MG chewable tablet Chew 81 mg by mouth daily.    [provider]  clindamycin (CLEOCIN) 150 MG capsule Take 1 capsule (150 mg total) by mouth every 6 (six) hours. 02/20/18   Fayrene Helper, PA-C  cloNIDine (CATAPRES) 0.1 MG tablet Take 0.1 mg by mouth 2 (two) times daily. 12/20/17   [provider]  clopidogrel (PLAVIX) 75 MG tablet Take 75 mg daily by mouth.    [provider]  glimepiride (AMARYL) 4 MG tablet Take 4 mg daily with breakfast by mouth.    [provider]  glipiZIDE (GLUCOTROL)  5 MG tablet Take 5 mg by mouth daily. 12/20/17   [provider]  HYDROcodone-acetaminophen (NORCO/VICODIN) 5-325 MG tablet Take 1 tablet by mouth every 6 (six) hours as needed for moderate pain. 02/20/18   Fayrene Helper, PA-C  ibuprofen (ADVIL,MOTRIN) 600 MG tablet Take 1 tablet (600 mg total) by mouth every 6 (six) hours as needed. 02/20/18   Fayrene Helper, PA-C  insulin NPH-regular Human (HUMULIN 70/30) (70-30) 100 UNIT/ML injection Inject 30 Units into the skin 2 (two) times daily. 07/01/17   [provider]  pantoprazole (PROTONIX) 40 MG tablet Take 40 mg daily by mouth.    [provider]    Family History No family history on file.  Social History Social History   Tobacco Use  . Smoking status: Current Every Day Smoker    Packs/day: 1.50    Years: 20.00    Pack years: 30.00    Types: Cigarettes  . Smokeless tobacco: Never Used  Substance Use Topics  . Alcohol use: Not Currently  . Drug use: Not Currently    Comment: last used yesterday. States " I do it everyday"      Allergies   Contrast media [iodinated diagnostic agents] and Tomato   Review of Systems Review of Systems  Constitutional: Negative for chills and fever.  Musculoskeletal: Positive for arthralgias (L shoulder pain) and myalgias (L upper  arm pain).  Skin: Negative for color change and wound.  Neurological: Negative for weakness and numbness.     Physical Exam Updated Vital Signs BP (!) 173/89 (BP Location: Right Arm)   Pulse 87   Temp 97.7 F (36.5 C) (Oral)   Resp 16   Ht 5\' 2"  (1.575 m)   Wt 71.2 kg   SpO2 100%   BMI 28.72 kg/m   Physical Exam  Constitutional: She appears well-developed and well-nourished. No distress.  HENT:  Head: Normocephalic and atraumatic.  Eyes: Conjunctivae are normal. Right eye exhibits no discharge. Left eye exhibits no discharge.  Neck: Normal range of motion. No spinous process tenderness present. No neck rigidity.  Cardiovascular:    Pulses:      Radial pulses are 2+ on the right side, and 2+ on the left side.  Musculoskeletal:  Upper extremities: No obvious deformity, appreciable swelling, erythema, ecchymosis, or increased warmth.  Patient has full active range of motion to bilateral wrists, elbows, to the right shoulder.  Left shoulder with full extension/adducion, mild limitation in flexion, abduction, and scaption.  Patient able to perform each of these somewhat past 90 degrees but is unable to go completely to 180.  She is tender to palpation over the posterior aspect of the left shoulder as well as the upper arm diffusely.  There is no point/focal bony tenderness or palpable joint instability.  Upper extremities are otherwise nontender.  She is neurovascularly intact distally.  She has discomfort with empty can test to the left upper extremity.  Neurological: She is alert.  Clear speech. Sensation grossly intact to bilateral upper extremities. 5/5 symmetric grip strength. Able to perform OK sign, thumbs up, and cross 2nd/3rd digits.   Psychiatric: She has a normal mood and affect. Her behavior is normal. Thought content normal.  Nursing note and vitals reviewed.    ED Treatments / Results  Labs (all labs ordered are listed, but only abnormal results are displayed) Labs Reviewed - No data to display  EKG None  Radiology No results found.  Procedures Procedures (including critical care time)  Medications Ordered in ED Medications - No data to display   Initial Impression / Assessment and Plan / ED Course  I have reviewed the triage vital signs and the nursing notes.  Pertinent labs & imaging results that were available during my care of the patient were reviewed by me and considered in my medical decision making (see chart for details).   Patient presents to the emergency department shoulder/upper arm pain for the past 4 months.  Patient nontoxic-appearing, no apparent distress, vitals notable for  elevated blood pressure, doubt HTN emergency, patient aware of need for recheck.  On exam there is no obvious deformity or swelling.  She has no erythema/warmth or fevers to raise concern for infectious etiology such as septic joint.  No traumatic injuries to raise concern for fracture dislocation.  Suspect possible rotator cuff etiology, she is neurovascularly intact distally.  Will provide robaxin ( instructed not to drive or operate heavy machinery when taking this medicine), NSAIDs avoided secondary to CAD with orthopedics follow-up. I discussed treatment plan, need for follow-up, and return precautions with the patient. Provided opportunity for questions, patient confirmed understanding and is in agreement with plan.   Final Clinical Impressions(s) / ED Diagnoses   Final diagnoses:  Chronic left shoulder pain    ED Discharge Orders         Ordered    methocarbamol (ROBAXIN) 500 MG  tablet  Every 8 hours PRN     06/10/18 [address], PA-C 06/10/18 1037    Maia Plan, MD 06/11/18 [phone]

## 2018-06-10 NOTE — ED Notes (Signed)
Bed: WTR7 Expected date:  Expected time:  Means of arrival:  Comments: 

## 2018-06-10 NOTE — Discharge Instructions (Addendum)
You were seen in the emergency department today for left shoulder/arm pain.  We think that the pain may be a problem with your rotator cuff. We are sending you home with a prescription for robaxin and would like you to follow up with orthopedics within 1 week for re-evaluation. We would also like you to follow up with your primary care provider in 1-2 weeks as your blood pressure was elevated in the Er today and should be rechecked. Return to the ER for new or worsening symptoms or any other concerns.   Vitals:   06/10/18 0933  BP: (!) 173/89  Pulse: 87  Resp: 16  Temp: 97.7 F (36.5 C)  SpO2: 100%     - Robaxin is the muscle relaxer I have prescribed, this is meant to help with muscle tightness. Be aware that this medication may make you drowsy therefore the first time you take this it should be at a time you are in an environment where you can rest. Do not drive or operate heavy machinery when taking this medication. Do not drink alcohol or take other sedating medications with this medicine such as narcotics or benzodiazepines.   You make take Tylenol per over the counter dosing with these medications.   We have prescribed you new medication(s) today. Discuss the medications prescribed today with your pharmacist as they can have adverse effects and interactions with your other medicines including over the counter and prescribed medications. Seek medical evaluation if you start to experience new or abnormal symptoms after taking one of these medicines, seek care immediately if you start to experience difficulty breathing, feeling of your throat closing, facial swelling, or rash as these could be indications of a more serious allergic reaction

## 2018-06-10 NOTE — ED Triage Notes (Signed)
Pt complains of pain in left arm for the past 4 months. Pt denies injury, states the pain is primarily in forearm, elbow and upper arm. Pain is worse when raising arm.

## 2018-06-21 ENCOUNTER — Other Ambulatory Visit: Payer: Self-pay

## 2018-06-21 ENCOUNTER — Emergency Department (HOSPITAL_COMMUNITY)
Admission: EM | Admit: 2018-06-21 | Discharge: 2018-06-21 | Disposition: A | Discharge status: Home / Self Care | Payer: Self-pay | Attending: Emergency Medicine | Admitting: Emergency Medicine

## 2018-06-21 ENCOUNTER — Encounter (HOSPITAL_COMMUNITY): Payer: Self-pay | Admitting: Emergency Medicine

## 2018-06-21 DIAGNOSIS — N939 Abnormal uterine and vaginal bleeding, unspecified: Secondary | ICD-10-CM | POA: Insufficient documentation

## 2018-06-21 DIAGNOSIS — Z5321 Procedure and treatment not carried out due to patient leaving prior to being seen by health care provider: Secondary | ICD-10-CM | POA: Insufficient documentation

## 2018-06-21 NOTE — ED Triage Notes (Addendum)
Patient c/o heavy vaginal bleeding with clots and weakness x10 days. Hx anemia. Denies pain.

## 2018-06-21 NOTE — ED Notes (Signed)
Unable to draw blood

## 2018-06-21 NOTE — ED Notes (Signed)
Patient called for room placement x3 with no answer. 

## 2018-06-21 NOTE — ED Notes (Signed)
Patient called for room placement x1 with no answer. 

## 2018-06-21 NOTE — ED Notes (Addendum)
Patient called for room placement x2 with no answer. 

## 2018-07-26 ENCOUNTER — Emergency Department (HOSPITAL_COMMUNITY): Payer: Self-pay

## 2018-07-26 ENCOUNTER — Encounter (HOSPITAL_COMMUNITY): Payer: Self-pay | Admitting: Emergency Medicine

## 2018-07-26 ENCOUNTER — Other Ambulatory Visit: Payer: Self-pay

## 2018-07-26 ENCOUNTER — Emergency Department (HOSPITAL_COMMUNITY)
Admission: EM | Admit: 2018-07-26 | Discharge: 2018-07-26 | Disposition: A | Discharge status: Home / Self Care | Payer: Self-pay | Attending: Emergency Medicine | Admitting: Emergency Medicine

## 2018-07-26 DIAGNOSIS — F17210 Nicotine dependence, cigarettes, uncomplicated: Secondary | ICD-10-CM | POA: Insufficient documentation

## 2018-07-26 DIAGNOSIS — I10 Essential (primary) hypertension: Secondary | ICD-10-CM | POA: Insufficient documentation

## 2018-07-26 DIAGNOSIS — Z7902 Long term (current) use of antithrombotics/antiplatelets: Secondary | ICD-10-CM | POA: Insufficient documentation

## 2018-07-26 DIAGNOSIS — E860 Dehydration: Secondary | ICD-10-CM | POA: Insufficient documentation

## 2018-07-26 DIAGNOSIS — E119 Type 2 diabetes mellitus without complications: Secondary | ICD-10-CM | POA: Insufficient documentation

## 2018-07-26 DIAGNOSIS — Z955 Presence of coronary angioplasty implant and graft: Secondary | ICD-10-CM | POA: Insufficient documentation

## 2018-07-26 DIAGNOSIS — Z7982 Long term (current) use of aspirin: Secondary | ICD-10-CM | POA: Insufficient documentation

## 2018-07-26 DIAGNOSIS — I2510 Atherosclerotic heart disease of native coronary artery without angina pectoris: Secondary | ICD-10-CM | POA: Insufficient documentation

## 2018-07-26 DIAGNOSIS — Z79899 Other long term (current) drug therapy: Secondary | ICD-10-CM | POA: Insufficient documentation

## 2018-07-26 DIAGNOSIS — R0789 Other chest pain: Secondary | ICD-10-CM | POA: Insufficient documentation

## 2018-07-26 DIAGNOSIS — J45901 Unspecified asthma with (acute) exacerbation: Secondary | ICD-10-CM | POA: Insufficient documentation

## 2018-07-26 DIAGNOSIS — Z794 Long term (current) use of insulin: Secondary | ICD-10-CM | POA: Insufficient documentation

## 2018-07-26 LAB — BASIC METABOLIC PANEL
ANION GAP: 10 (ref 5–15)
BUN: 14 mg/dL (ref 6–20)
CALCIUM: 9.4 mg/dL (ref 8.9–10.3)
CO2: 22 mmol/L (ref 22–32)
Chloride: 104 mmol/L (ref 98–111)
Creatinine, Ser: 1.55 mg/dL — ABNORMAL HIGH (ref 0.44–1.00)
GFR calc Af Amer: 46 mL/min — ABNORMAL LOW (ref >60)
GFR, EST NON AFRICAN AMERICAN: 40 mL/min — AB (ref >60)
GLUCOSE: 204 mg/dL — AB (ref 70–99)
POTASSIUM: 4.7 mmol/L (ref 3.5–5.1)
SODIUM: 136 mmol/L (ref 135–145)

## 2018-07-26 LAB — RAPID URINE DRUG SCREEN, HOSP PERFORMED
AMPHETAMINES: NOT DETECTED
BARBITURATES: NOT DETECTED
BENZODIAZEPINES: NOT DETECTED
Cocaine: NOT DETECTED
Opiates: NOT DETECTED
Tetrahydrocannabinol: NOT DETECTED

## 2018-07-26 LAB — I-STAT TROPONIN, ED
TROPONIN I, POC: 0 ng/mL (ref 0.00–0.08)
Troponin i, poc: 0 ng/mL (ref 0.00–0.08)

## 2018-07-26 LAB — CBC
HCT: 32.3 % — ABNORMAL LOW (ref 36.0–46.0)
HEMOGLOBIN: 10.3 g/dL — AB (ref 12.0–15.0)
MCH: 29 pg (ref 26.0–34.0)
MCHC: 31.9 g/dL (ref 30.0–36.0)
MCV: 91 fL (ref 80.0–100.0)
NRBC: 0 % (ref 0.0–0.2)
Platelets: 247 10*3/uL (ref 150–400)
RBC: 3.55 MIL/uL — ABNORMAL LOW (ref 3.87–5.11)
RDW: 15.1 % (ref 11.5–15.5)
WBC: 10.3 10*3/uL (ref 4.0–10.5)

## 2018-07-26 LAB — I-STAT BETA HCG BLOOD, ED (MC, WL, AP ONLY)

## 2018-07-26 MED ORDER — PREDNISONE 20 MG PO TABS
60.0000 mg | ORAL_TABLET | Freq: Once | ORAL | Status: AC
  Administered 2018-07-26: 60 mg via ORAL
  Filled 2018-07-26: qty 3

## 2018-07-26 MED ORDER — PREDNISONE 20 MG PO TABS: 60.0000 mg | ORAL_TABLET | Freq: Every day | ORAL | 0 refills | Status: AC

## 2018-07-26 MED ORDER — SODIUM CHLORIDE 0.9 % IV BOLUS
500.0000 mL | Freq: Once | INTRAVENOUS | Status: AC
  Administered 2018-07-26: 500 mL via INTRAVENOUS

## 2018-07-26 MED ORDER — IPRATROPIUM-ALBUTEROL 0.5-2.5 (3) MG/3ML IN SOLN
3.0000 mL | Freq: Once | RESPIRATORY_TRACT | Status: AC
  Administered 2018-07-26: 3 mL via RESPIRATORY_TRACT
  Filled 2018-07-26: qty 3

## 2018-07-26 MED ORDER — ALBUTEROL SULFATE HFA 108 (90 BASE) MCG/ACT IN AERS
2.0000 | INHALATION_SPRAY | Freq: Once | RESPIRATORY_TRACT | Status: AC
  Administered 2018-07-26: 2 via RESPIRATORY_TRACT
  Filled 2018-07-26: qty 6.7

## 2018-07-26 MED ORDER — ASPIRIN 81 MG PO CHEW: 324.0000 mg | CHEWABLE_TABLET | Freq: Once | ORAL | Status: DC

## 2018-07-26 NOTE — ED Triage Notes (Addendum)
Pt in from home via [facility] with sharp L side cp starting at 0700 while pt walking to BR. Pt took 2 NTG and 324 ASA PTA. Also c/o dizziness, denies sob or nausea. Hx of MI with stents placed. Initial BP 86/60 > 268ml's NS given en route. 97% RA, CBG 335, daily smoker

## 2018-07-26 NOTE — ED Provider Notes (Signed)
[facility] EMERGENCY DEPARTMENT Provider Note   CSN: [id number] Arrival date & time: 07/26/18  0909     History   Chief Complaint Chief Complaint  Patient presents with  . Chest Pain    HPI Maria Harper is a 47 y.o. female.  The history is provided by the patient.  Chest Pain   This is a new problem. The current episode started 3 to 5 hours ago. The problem occurs constantly. The problem has not changed since onset.The pain is associated with coughing, movement and raising an arm. The pain is present in the substernal region. The pain is at a severity of 3/10. The pain is moderate. The quality of the pain is described as sharp. The pain does not radiate. The symptoms are aggravated by certain positions. Associated symptoms include cough and shortness of breath. Pertinent negatives include no abdominal pain, no back pain, no diaphoresis, no dizziness, no exertional chest pressure, no fever, no headaches, no hemoptysis, no lower extremity edema, no malaise/fatigue, no near-syncope, no palpitations, no vomiting and no weakness. She has tried nitroglycerin for the symptoms. The treatment provided no relief.  Her past medical history is significant for CAD, hyperlipidemia and hypertension.  Pertinent negatives for past medical history include no seizures. Past medical history comments: cocaine abuse/asthma  Procedure history is positive for stress echo (negative in late september).    Past Medical History:  Diagnosis Date  . Acid reflux   . Diabetes mellitus without complication (HCC)   . Heart attack (HCC) 04/2016  . High cholesterol   . Hypertension     Patient Active Problem List   Diagnosis Date Noted  . Cocaine abuse with cocaine-induced mood disorder (HCC) 06/10/2017    History reviewed. No pertinent surgical history.   OB History   No obstetric history on file.      Home Medications    Prior to Admission medications   Medication Sig Start  Date End Date Taking? Authorizing Provider  albuterol (VENTOLIN HFA) 108 (90 Base) MCG/ACT inhaler Inhale 2 puffs into the lungs every 6 (six) hours as needed for wheezing or shortness of breath.   Yes [provider]  aspirin 81 MG chewable tablet Chew 81 mg by mouth daily.   Yes [provider]  atorvastatin (LIPITOR) 80 MG tablet Take 80 mg by mouth daily at 6 PM.  04/05/18  Yes [provider]  busPIRone (BUSPAR) 5 MG tablet Take 5 mg by mouth 2 (two) times daily. 04/05/18  Yes [provider]  cloNIDine (CATAPRES) 0.1 MG tablet Take 0.1 mg by mouth 2 (two) times daily. 12/20/17  Yes [provider]  clopidogrel (PLAVIX) 75 MG tablet Take 75 mg by mouth 2 (two) times daily.    Yes [provider]  cyclobenzaprine (FLEXERIL) 10 MG tablet Take 10 mg by mouth 3 (three) times daily.   Yes [provider]  glipiZIDE (GLUCOTROL) 5 MG tablet Take 5 mg by mouth 2 (two) times daily before a meal.  12/20/17  Yes [provider]  HUMULIN R 100 UNIT/ML injection Inject 8 Units into the skin 2 (two) times daily. 06/13/18  Yes [provider]  ibuprofen (ADVIL,MOTRIN) 600 MG tablet Take 1 tablet (600 mg total) by mouth every 6 (six) hours as needed. Patient taking differently: Take 800 mg by mouth every 6 (six) hours as needed for mild pain or moderate pain.  02/20/18  Yes Fayrene Helper, PA-C  insulin NPH-regular Human (HUMULIN 70/30) (70-30)  100 UNIT/ML injection Inject 35 Units into the skin 2 (two) times daily.  07/01/17  Yes [provider]  methocarbamol (ROBAXIN) 500 MG tablet Take 1 tablet (500 mg total) by mouth every 8 (eight) hours as needed for muscle spasms. 06/10/18  Yes Petrucelli, Samantha R, PA-C  metoprolol tartrate (LOPRESSOR) 50 MG tablet Take 50 mg by mouth 2 (two) times daily.   Yes [provider]  nitroGLYCERIN (NITROSTAT) 0.4 MG SL tablet Place 0.4 mg under the tongue as needed. 04/05/18  Yes [provider]  pantoprazole (PROTONIX) 40 MG tablet Take 40 mg by mouth 2 (two) times daily.    Yes [provider]  polyethylene glycol powder (GLYCOLAX/MIRALAX) powder Take 17 g by mouth 3 (three) times daily.  04/05/18  Yes [provider]  QUEtiapine (SEROQUEL) 100 MG tablet Take 200 mg by mouth every morning. 04/05/18  Yes [provider]  QUEtiapine (SEROQUEL) 400 MG tablet Take 400 mg by mouth at bedtime. 04/05/18  Yes [provider]  traZODone (DESYREL) 50 MG tablet Take 150 mg by mouth at bedtime. 04/05/18  Yes [provider]  clindamycin (CLEOCIN) 150 MG capsule Take 1 capsule (150 mg total) by mouth every 6 (six) hours. Patient not taking: Reported on 07/26/2018 02/20/18   Fayrene Helperran, [place], PA-C  HYDROcodone-acetaminophen (NORCO/VICODIN) 5-325 MG tablet Take 1 tablet by mouth every 6 (six) hours as needed for moderate pain. Patient not taking: Reported on 07/26/2018 02/20/18   Fayrene Helperran, [place], PA-C  predniSONE (DELTASONE) 20 MG tablet Take 3 tablets (60 mg total) by mouth daily for 4 days. 07/26/18 07/30/18  Virgina Norfolkuratolo, Daisie Haft, DO    Family History No family history on file.  Social History Social History   Tobacco Use  . Smoking status: Current Every Day Smoker    Packs/day: 1.50    Years: 20.00    Pack years: 30.00    Types: Cigarettes  . Smokeless tobacco: Never Used  Substance Use Topics  . Alcohol use: Not Currently  . Drug use: Not Currently    Comment: last used yesterday. States " I do it everyday"      Allergies   Contrast media [iodinated diagnostic agents] and Tomato   Review of Systems Review of Systems  Constitutional: Negative for chills, diaphoresis, fever and malaise/fatigue.  HENT: Negative for ear pain and sore throat.   Eyes: Negative for pain and visual disturbance.  Respiratory: Positive for cough and shortness of breath. Negative for hemoptysis.   Cardiovascular: Positive for chest pain. Negative for palpitations and  near-syncope.  Gastrointestinal: Negative for abdominal pain and vomiting.  Genitourinary: Negative for dysuria and hematuria.  Musculoskeletal: Negative for arthralgias and back pain.  Skin: Negative for color change and rash.  Neurological: Negative for dizziness, seizures, syncope, weakness and headaches.  All other systems reviewed and are negative.    Physical Exam Updated Vital Signs  ED Triage Vitals  Enc Vitals Group     BP 07/26/18 0910 112/82     Pulse Rate 07/26/18 0910 72     Resp 07/26/18 0910 16     Temp 07/26/18 0910 97.7 F (36.5 C)     Temp Source 07/26/18 0910 Oral     SpO2 07/26/18 0909 97 %     Weight 07/26/18 0913 156 lb 15.5 oz (71.2 kg)     Height --      Head Circumference --      Peak Flow --      Pain Score  07/26/18 0913 6     Pain Loc --      Pain Edu? --      Excl. in GC? --     Physical Exam Vitals signs and nursing note reviewed.  Constitutional:      General: She is not in acute distress.    Appearance: She is well-developed.  HENT:     Head: Normocephalic and atraumatic.  Eyes:     Conjunctiva/sclera: Conjunctivae normal.     Pupils: Pupils are equal, round, and reactive to light.  Neck:     Musculoskeletal: Normal range of motion and neck supple.  Cardiovascular:     Rate and Rhythm: Normal rate and regular rhythm.     Pulses:          Radial pulses are 2+ on the right side and 2+ on the left side.       Dorsalis pedis pulses are 2+ on the right side and 2+ on the left side.     Heart sounds: Normal heart sounds. No murmur.  Pulmonary:     Effort: Pulmonary effort is normal. No respiratory distress.     Breath sounds: Decreased breath sounds and wheezing present.  Chest:     Chest wall: Tenderness present.  Abdominal:     Palpations: Abdomen is soft.     Tenderness: There is no abdominal tenderness.  Musculoskeletal:     Right lower leg: No edema.     Left lower leg: No edema.  Skin:    General: Skin is warm and dry.      Capillary Refill: Capillary refill takes less than 2 seconds.  Neurological:     General: No focal deficit present.     Mental Status: She is alert.  Psychiatric:        Mood and Affect: Mood normal.      ED Treatments / Results  Labs (all labs ordered are listed, but only abnormal results are displayed) Labs Reviewed  BASIC METABOLIC PANEL - Abnormal; Notable for the following components:      Result Value   Glucose, Bld 204 (*)    Creatinine, Ser 1.55 (*)    GFR calc non Af Amer 40 (*)    GFR calc Af Amer 46 (*)    All other components within normal limits  CBC - Abnormal; Notable for the following components:   RBC 3.55 (*)    Hemoglobin 10.3 (*)    HCT 32.3 (*)    All other components within normal limits  RAPID URINE DRUG SCREEN, HOSP PERFORMED  I-STAT TROPONIN, ED  I-STAT BETA HCG BLOOD, ED ([facility], [facility], [facility] ONLY)  I-STAT TROPONIN, ED    EKG EKG Interpretation  Date/Time:  Wednesday July 26 2018 09:09:20 EST Ventricular Rate:  72 PR Interval:    QRS Duration: 110 QT Interval:  398 QTC Calculation: 436 R Axis:   -9 Text Interpretation:  Sinus rhythm Probable left atrial enlargement Nonspecific T abnormalities, diffuse leads Confirmed by Virgina Norfolk [phone]) on 07/26/2018 9:13:21 AM   Radiology Dg Chest Portable 1 View  Result Date: 07/26/2018 CLINICAL DATA:  Asthma.  Sharp left chest pain. EXAM: PORTABLE CHEST 1 VIEW COMPARISON:  01/18/2018 FINDINGS: 0912 hours. Low volume film with basilar atelectasis. Cardiopericardial silhouette is at upper limits of normal for size. The visualized bony structures of the thorax are intact. Telemetry leads overlie the chest. IMPRESSION: Low volumes with basilar atelectasis. Electronically Signed   By: Kennith Center M.D.   On:  07/26/2018 09:39    Procedures Procedures (including critical care time)  Medications Ordered in ED Medications  aspirin chewable tablet 324 mg (324 mg Oral Not Given 07/26/18 0917)  albuterol  (PROVENTIL HFA;VENTOLIN HFA) 108 (90 Base) MCG/ACT inhaler 2 puff (has no administration in time range)  ipratropium-albuterol (DUONEB) 0.5-2.5 (3) MG/3ML nebulizer solution 3 mL (3 mLs Nebulization Given 07/26/18 0933)  sodium chloride 0.9 % bolus 500 mL (0 mLs Intravenous Stopped 07/26/18 1135)  predniSONE (DELTASONE) tablet 60 mg (60 mg Oral Given 07/26/18 1037)     Initial Impression / Assessment and Plan / ED Course  I have reviewed the triage vital signs and the nursing notes.  Pertinent labs & imaging results that were available during my care of the patient were reviewed by me and considered in my medical decision making (see chart for details).     Mikiya Nebergall is a 47 year old female with history of chest pain status post stent, asthma, high cholesterol, hypertension, cocaine abuse, diabetes who presents the ED with chest pain.  Patient with normal vitals.  No fever.  Patient with a EKG that shows sinus rhythm.  She has T wave inversions laterally and inferiorly that are seen on prior EKGs.  She states that she woke up with chest pain, shortness of breath this morning.  States that it is hard to get a good deep breath.  She does not have any PE or DVT risk factors.  She is PERC negative.  Doubt PE.  Patient has some reproducible tenderness to her anterior chest wall.  She has diminished breath sounds throughout with wheezing.  She denies any recent cocaine use.  Denies any recent trauma.  Has not used her inhaler.  Patient had a stress test back in late September of this year that was unremarkable.  Will evaluate with troponin, chest x-ray, basic labs.  Will get UDS. Patient likely with asthma exacerbation versus pneumonia versus musculoskeletal type pain.  However will do cardiac work-up as well, although atypical.   Patient with hemoglobin stable.  Was 10.6 last month at outside hospital.  Patient with mild elevation in creatinine.  Given 500 cc bolus for dehydration.  Patient with  otherwise no significant leukocytosis, electrolyte abnormality.  Chest x-ray showed no signs of pneumonia.  No pneumothorax, no pleural effusion.  Patient felt better after breathing treatments.  Was given prednisone.  Troponin within normal limits x 2.  Suspect likely asthma exacerbation.  Atypical chest pain and doubt ACS.  Suspect respiratory source is because of her pain today.  Was given prescription for prednisone.  Remained hemodynamically stable throughout my care and discharged in ED in good condition.  This chart was dictated using voice recognition software.  Despite best efforts to proofread,  errors can occur which can change the documentation meaning.   Final Clinical Impressions(s) / ED Diagnoses   Final diagnoses:  Atypical chest pain  Chest wall pain  Asthma with acute exacerbation, unspecified asthma severity, unspecified whether persistent    ED Discharge Orders         Ordered    predniSONE (DELTASONE) 20 MG tablet  Daily     07/26/18 1237           Virgina Norfolk, DO 07/26/18 1238

## 2018-12-27 ENCOUNTER — Other Ambulatory Visit (HOSPITAL_BASED_OUTPATIENT_CLINIC_OR_DEPARTMENT_OTHER): Payer: Self-pay

## 2018-12-27 DIAGNOSIS — G4730 Sleep apnea, unspecified: Secondary | ICD-10-CM

## 2018-12-27 DIAGNOSIS — R5383 Other fatigue: Secondary | ICD-10-CM

## 2018-12-27 DIAGNOSIS — R0683 Snoring: Secondary | ICD-10-CM

## 2018-12-27 DIAGNOSIS — G4710 Hypersomnia, unspecified: Secondary | ICD-10-CM

## 2019-01-19 ENCOUNTER — Other Ambulatory Visit (HOSPITAL_COMMUNITY): Payer: Self-pay | Attending: Internal Medicine

## 2019-01-23 ENCOUNTER — Encounter (HOSPITAL_BASED_OUTPATIENT_CLINIC_OR_DEPARTMENT_OTHER): Payer: Self-pay

## 2019-02-06 ENCOUNTER — Other Ambulatory Visit (HOSPITAL_COMMUNITY)
Admission: RE | Admit: 2019-02-06 | Discharge: 2019-02-06 | Disposition: A | Discharge status: Home / Self Care | Payer: Self-pay | Source: Ambulatory Visit | Attending: Internal Medicine | Admitting: Internal Medicine

## 2019-02-06 NOTE — Progress Notes (Signed)
Unable to contact Mrs Gradel to inquire her arrival today for Covid 19 testing prior to sleep study 02/09/19. Sleep center updated.

## 2019-02-09 ENCOUNTER — Encounter (HOSPITAL_BASED_OUTPATIENT_CLINIC_OR_DEPARTMENT_OTHER): Payer: Self-pay

## 2019-02-17 ENCOUNTER — Encounter (HOSPITAL_COMMUNITY): Payer: Self-pay | Admitting: Emergency Medicine

## 2019-02-17 ENCOUNTER — Emergency Department (HOSPITAL_COMMUNITY)
Admission: EM | Admit: 2019-02-17 | Discharge: 2019-02-17 | Disposition: A | Discharge status: Home / Self Care | Payer: Self-pay | Attending: Emergency Medicine | Admitting: Emergency Medicine

## 2019-02-17 ENCOUNTER — Other Ambulatory Visit: Payer: Self-pay

## 2019-02-17 ENCOUNTER — Emergency Department (HOSPITAL_BASED_OUTPATIENT_CLINIC_OR_DEPARTMENT_OTHER)
Admit: 2019-02-17 | Discharge: 2019-02-17 | Disposition: A | Discharge status: Home / Self Care | Payer: Self-pay | Attending: Emergency Medicine | Admitting: Emergency Medicine

## 2019-02-17 ENCOUNTER — Emergency Department (HOSPITAL_COMMUNITY): Payer: Self-pay

## 2019-02-17 DIAGNOSIS — Z7902 Long term (current) use of antithrombotics/antiplatelets: Secondary | ICD-10-CM | POA: Insufficient documentation

## 2019-02-17 DIAGNOSIS — Z7982 Long term (current) use of aspirin: Secondary | ICD-10-CM | POA: Insufficient documentation

## 2019-02-17 DIAGNOSIS — I252 Old myocardial infarction: Secondary | ICD-10-CM | POA: Insufficient documentation

## 2019-02-17 DIAGNOSIS — R609 Edema, unspecified: Secondary | ICD-10-CM

## 2019-02-17 DIAGNOSIS — Z79899 Other long term (current) drug therapy: Secondary | ICD-10-CM | POA: Insufficient documentation

## 2019-02-17 DIAGNOSIS — E119 Type 2 diabetes mellitus without complications: Secondary | ICD-10-CM | POA: Insufficient documentation

## 2019-02-17 DIAGNOSIS — Z794 Long term (current) use of insulin: Secondary | ICD-10-CM | POA: Insufficient documentation

## 2019-02-17 DIAGNOSIS — R600 Localized edema: Secondary | ICD-10-CM | POA: Insufficient documentation

## 2019-02-17 DIAGNOSIS — I10 Essential (primary) hypertension: Secondary | ICD-10-CM | POA: Insufficient documentation

## 2019-02-17 DIAGNOSIS — F17210 Nicotine dependence, cigarettes, uncomplicated: Secondary | ICD-10-CM | POA: Insufficient documentation

## 2019-02-17 LAB — I-STAT CHEM 8, ED
BUN: 9 mg/dL (ref 6–20)
Calcium, Ion: 1.11 mmol/L — ABNORMAL LOW (ref 1.15–1.40)
Chloride: 103 mmol/L (ref 98–111)
Creatinine, Ser: 0.9 mg/dL (ref 0.44–1.00)
Glucose, Bld: 151 mg/dL — ABNORMAL HIGH (ref 70–99)
HCT: 35 % — ABNORMAL LOW (ref 36.0–46.0)
Hemoglobin: 11.9 g/dL — ABNORMAL LOW (ref 12.0–15.0)
Potassium: 3.1 mmol/L — ABNORMAL LOW (ref 3.5–5.1)
Sodium: 139 mmol/L (ref 135–145)
TCO2: 25 mmol/L (ref 22–32)

## 2019-02-17 IMAGING — DX PORTABLE CHEST - 1 VIEW
1 series · 1 of 1 positions shown · non-contrast
Comparison: [DATE]

CLINICAL DATA: Peripheral edema

EXAM:
PORTABLE CHEST 1 VIEW

[chest ap]
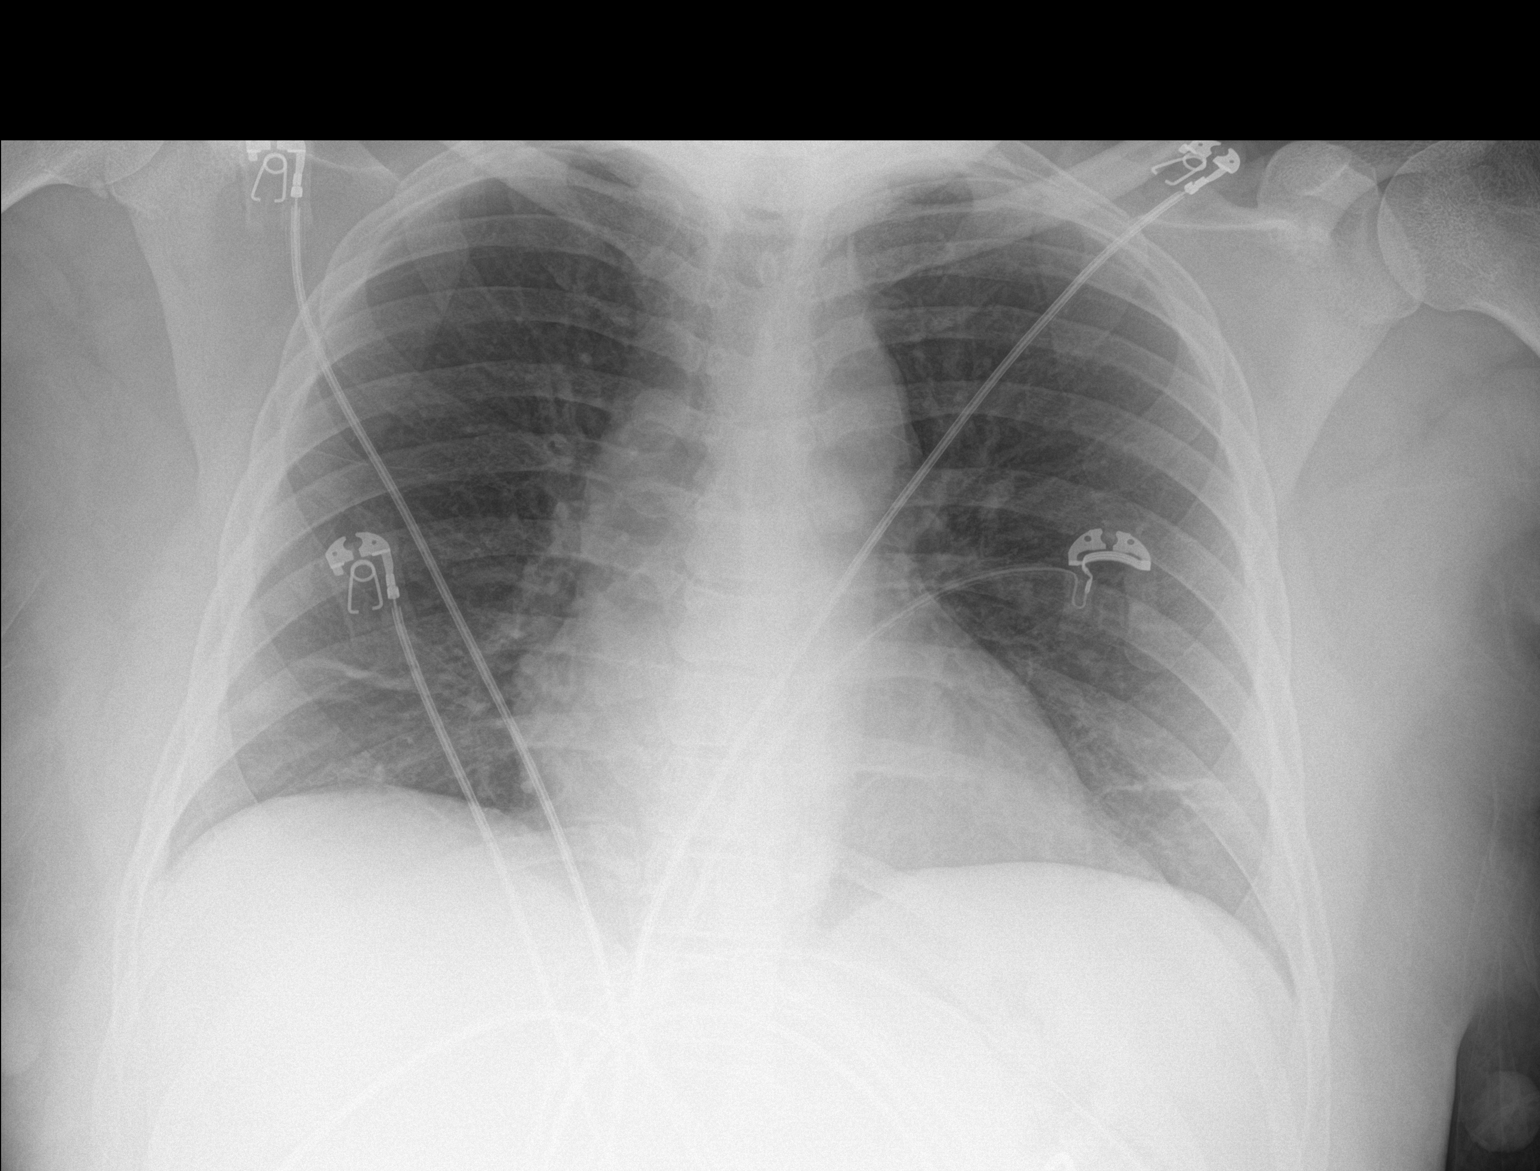

[1 of 1 positions shown; findings below may reference images not displayed]

FINDINGS: Mild cardiomegaly. Minimal pulmonary vascular prominence without
overt edema. Bandlike atelectasis of the bilateral lung bases.
IMPRESSION: Mild cardiomegaly. Minimal pulmonary vascular prominence without
overt edema. Bandlike atelectasis of the bilateral lung bases.

## 2019-02-17 NOTE — ED Provider Notes (Addendum)
[facility]-EMERGENCY DEPT Provider Note   CSN: [id number] Arrival date & time: 02/17/19  1221     History   Chief Complaint Chief Complaint  Patient presents with   edema of the feet    HPI Maria Harper is a 48 y.o. female.     The history is provided by the patient and medical records. No language interpreter was used.     48 year old female presenting for evaluation of leg swelling.  Patient report for the past month she has noticed increased leg swelling.  Swelling is involving both legs, left greater than right.  She does complain of some tightness sensation about her leg but minimal pain.  Symptom has not resolved.  No related injury.  She does not have any significant chest pain or shortness of breath.  No fever chills no productive cough.  Denies any abdominal swelling.  When asked if she has any history of CHF in the past, patient states that she does however she is not on any fluid pills.  No indication of CHF in her note but she did had prior heart attack.  She does not weigh herself on a regular basis.  She denies any history of renal problem.  She is currently on Plavix.  She mention she has a clot near her heart in the past.  Past Medical History:  Diagnosis Date   Acid reflux    Diabetes mellitus without complication (HCC)    Heart attack (HCC) 04/2016   High cholesterol    Hypertension     Patient Active Problem List   Diagnosis Date Noted   Cocaine abuse with cocaine-induced mood disorder (HCC) 06/10/2017    No past surgical history on file.   OB History   No obstetric history on file.      Home Medications    Prior to Admission medications   Medication Sig Start Date End Date Taking? Authorizing Provider  albuterol (VENTOLIN HFA) 108 (90 Base) MCG/ACT inhaler Inhale 2 puffs into the lungs every 6 (six) hours as needed for wheezing or shortness of breath.    [provider]  aspirin 81 MG chewable tablet Chew  81 mg by mouth daily.    [provider]  atorvastatin (LIPITOR) 80 MG tablet Take 80 mg by mouth daily at 6 PM.  04/05/18   [provider]  busPIRone (BUSPAR) 5 MG tablet Take 5 mg by mouth 2 (two) times daily. 04/05/18   [provider]  clindamycin (CLEOCIN) 150 MG capsule Take 1 capsule (150 mg total) by mouth every 6 (six) hours. Patient not taking: Reported on 07/26/2018 02/20/18   Fayrene Helper, PA-C  cloNIDine (CATAPRES) 0.1 MG tablet Take 0.1 mg by mouth 2 (two) times daily. 12/20/17   [provider]  clopidogrel (PLAVIX) 75 MG tablet Take 75 mg by mouth 2 (two) times daily.     [provider]  cyclobenzaprine (FLEXERIL) 10 MG tablet Take 10 mg by mouth 3 (three) times daily.    [provider]  glipiZIDE (GLUCOTROL) 5 MG tablet Take 5 mg by mouth 2 (two) times daily before a meal.  12/20/17   [provider]  HUMULIN R 100 UNIT/ML injection Inject 8 Units into the skin 2 (two) times daily. 06/13/18   [provider]  HYDROcodone-acetaminophen (NORCO/VICODIN) 5-325 MG tablet Take 1 tablet by mouth every 6 (six) hours as needed for moderate pain. Patient not taking: Reported on 07/26/2018 02/20/18   Fayrene Helper, PA-C  ibuprofen (ADVIL,MOTRIN) 600 MG tablet Take 1 tablet (600 mg total) by mouth every 6 (six) hours as needed. Patient taking differently: Take 800 mg by mouth every 6 (six) hours as needed for mild pain or moderate pain.  02/20/18   Fayrene Helperran, Crimson Beer, PA-C  insulin NPH-regular Human (HUMULIN 70/30) (70-30) 100 UNIT/ML injection Inject 35 Units into the skin 2 (two) times daily.  07/01/17   [provider]  methocarbamol (ROBAXIN) 500 MG tablet Take 1 tablet (500 mg total) by mouth every 8 (eight) hours as needed for muscle spasms. 06/10/18   Petrucelli, Samantha R, PA-C  metoprolol tartrate (LOPRESSOR) 50 MG tablet Take 50 mg by mouth 2 (two) times daily.    [provider]  nitroGLYCERIN (NITROSTAT) 0.4  MG SL tablet Place 0.4 mg under the tongue as needed. 04/05/18   [provider]  pantoprazole (PROTONIX) 40 MG tablet Take 40 mg by mouth 2 (two) times daily.     [provider]  polyethylene glycol powder (GLYCOLAX/MIRALAX) powder Take 17 g by mouth 3 (three) times daily.  04/05/18   [provider]  QUEtiapine (SEROQUEL) 100 MG tablet Take 200 mg by mouth every morning. 04/05/18   [provider]  QUEtiapine (SEROQUEL) 400 MG tablet Take 400 mg by mouth at bedtime. 04/05/18   [provider]  traZODone (DESYREL) 50 MG tablet Take 150 mg by mouth at bedtime. 04/05/18   [provider]    Family History No family history on file.  Social History Social History   Tobacco Use   Smoking status: Current Every Day Smoker    Packs/day: 1.50    Years: 20.00    Pack years: 30.00    Types: Cigarettes   Smokeless tobacco: Never Used  Substance Use Topics   Alcohol use: Not Currently   Drug use: Not Currently    Comment: last used yesterday. States " I do it everyday"      Allergies   Contrast media [iodinated diagnostic agents] and Tomato   Review of Systems Review of Systems  All other systems reviewed and are negative.    Physical Exam Updated Vital Signs BP (!) 158/90 (BP Location: Left Arm)    Pulse 92    Temp 98.5 F (36.9 C) (Oral)    Resp 16    Ht 5\' 2"  (1.575 m)    Wt 74.4 kg    LMP 02/07/2019 (Approximate)    SpO2 100%    BMI 30.00 kg/m   Physical Exam Vitals signs and nursing note reviewed.  Constitutional:      General: She is not in acute distress.    Appearance: She is well-developed.  HENT:     Head: Atraumatic.  Eyes:     Conjunctiva/sclera: Conjunctivae normal.  Neck:     Musculoskeletal: Neck supple.     Comments: No JVD Cardiovascular:     Rate and Rhythm: Normal rate and regular rhythm.     Pulses: Normal pulses.     Heart sounds: Normal heart sounds. No murmur. No friction rub. No gallop.    Pulmonary:     Breath sounds: Normal breath sounds. No wheezing, rhonchi or rales.  Abdominal:     Palpations: Abdomen is soft.     Tenderness: There is no abdominal tenderness.  Musculoskeletal:        General: Swelling (Trace 1+ pitting edema to bilateral lower extremities left greater than right.  No significant calf tenderness.  Dorsalis pedis pulse palpable bilaterally without  any evidence of skin erythema.) present.  Skin:    Capillary Refill: Capillary refill takes less than 2 seconds.     Findings: No rash.  Neurological:     Mental Status: She is alert and oriented to person, place, and time.  Psychiatric:        Mood and Affect: Mood normal.      ED Treatments / Results  Labs (all labs ordered are listed, but only abnormal results are displayed) Labs Reviewed  BASIC METABOLIC PANEL  BRAIN NATRIURETIC PEPTIDE  CBC WITH DIFFERENTIAL/PLATELET    EKG None  Radiology Dg Chest Port 1 View  Result Date: 02/17/2019 CLINICAL DATA:  Peripheral edema EXAM: PORTABLE CHEST 1 VIEW COMPARISON:  01/18/2018 FINDINGS: Mild cardiomegaly. Minimal pulmonary vascular prominence without overt edema. Bandlike atelectasis of the bilateral lung bases. IMPRESSION: Mild cardiomegaly. Minimal pulmonary vascular prominence without overt edema. Bandlike atelectasis of the bilateral lung bases. Electronically Signed   By: Eddie Candle M.D.   On: 02/17/2019 14:07   Vas [place] Lower Extremity Venous (dvt) (mc And Wl 7a-7p)  Result Date: 02/17/2019  Lower Venous Study Indications: Edema. Edema and pain in the feet intermittent for about a month, Has been vonstant for the past two weeks. left worse than right  Risk Factors: Hypertension and CHF. Performing Technologist: Toma Copier RVS  Examination Guidelines: A complete evaluation includes B-mode imaging, spectral Doppler, color Doppler, and power Doppler as needed of all accessible portions of each vessel. Bilateral testing is considered an  integral part of a complete examination. Limited examinations for reoccurring indications may be performed as noted.  +---------+---------------+---------+-----------+----------+-------+  RIGHT     Compressibility Phasicity Spontaneity Properties Summary  +---------+---------------+---------+-----------+----------+-------+  CFV       Full            Yes       Yes                             +---------+---------------+---------+-----------+----------+-------+  SFJ       Full                                                      +---------+---------------+---------+-----------+----------+-------+  FV Prox   Full            Yes       Yes                             +---------+---------------+---------+-----------+----------+-------+  FV Mid    Full                                                      +---------+---------------+---------+-----------+----------+-------+  FV Distal Full            Yes       Yes                             +---------+---------------+---------+-----------+----------+-------+  PFV       Full            Yes  Yes                             +---------+---------------+---------+-----------+----------+-------+  POP       Full            Yes       Yes                             +---------+---------------+---------+-----------+----------+-------+  PTV       Full                                                      +---------+---------------+---------+-----------+----------+-------+  PERO      Full                                                      +---------+---------------+---------+-----------+----------+-------+   +---------+---------------+---------+-----------+----------+-------------------+  LEFT      Compressibility Phasicity Spontaneity Properties Summary              +---------+---------------+---------+-----------+----------+-------------------+  CFV       Full            Yes       Yes                                          +---------+---------------+---------+-----------+----------+-------------------+  SFJ       Full                                                                  +---------+---------------+---------+-----------+----------+-------------------+  FV Prox   Full            Yes       Yes                                         +---------+---------------+---------+-----------+----------+-------------------+  FV Mid    Full                                                                  +---------+---------------+---------+-----------+----------+-------------------+  FV Distal Full            Yes       Yes                                         +---------+---------------+---------+-----------+----------+-------------------+  PFV       Full  Yes       Yes                                         +---------+---------------+---------+-----------+----------+-------------------+  POP       Full            Yes       Yes                                         +---------+---------------+---------+-----------+----------+-------------------+  PTV       Full                                                                  +---------+---------------+---------+-----------+----------+-------------------+  PERO                                                       Color flow appears                                                               normal. Unable to                                                                visualize the                                                                    transvers view to                                                                adequately evaluate                                                              the compression      +---------+---------------+---------+-----------+----------+-------------------+     Summary: Right: There is no evidence of deep vein thrombosis  in the lower extremity. No cystic structure found in the popliteal fossa. Left: There is no evidence  of deep vein thrombosis in the lower extremity. No cystic structure found in the popliteal fossa.  *See table(s) above for measurements and observations.    Preliminary     Procedures Procedures (including critical care time)  Medications Ordered in ED Medications - No data to display   Initial Impression / Assessment and Plan / ED Course  I have reviewed the triage vital signs and the nursing notes.  Pertinent labs & imaging results that were available during my care of the patient were reviewed by me and considered in my medical decision making (see chart for details).        BP (!) 158/90 (BP Location: Left Arm)    Pulse 84    Temp 98.5 F (36.9 C) (Oral)    Resp 18    Ht  (1.575 m)    Wt 74.4 kg    LMP 02/07/2019 (Approximate)    SpO2 100%    BMI 30.00 kg/m    Final Clinical Impressions(s) / ED Diagnoses   Final diagnoses:  Peripheral edema    ED Discharge Orders    None     12:54 PM Patient report bilateral leg swelling ongoing for the past month.  She mentioned she has history of CHF but I do not see any documented CHF in her record.  She also reported history of blood clots and currently on Plavix.  Swelling is less likely to be DVT since it is bilateral however will obtain a DVT study.  Will check BNP and labs.  No evidence to suggest infection.  Doubt injury causing leg swelling.  She is neurovascular intact.  2:58 PM DVT study negative.  Labs are pending.  Pt sign out to oncoming provider who will f/u on labs and determine disposition.    Fayrene Helper, PA-C 02/17/19 1506    Bethann Berkshire, MD 02/17/19 1545  ADDENDUM: Procedure of US guided with aspiration from Right AC was performed by Dr. Patria Mane, please refer to his procedure note.  Thanks.     Fayrene Helper, PA-C 02/28/19 Carleene Overlie    Bethann Berkshire, MD 02/28/19 [phone]

## 2019-02-17 NOTE — ED Triage Notes (Signed)
Pt c/o edema and pain of bilateral feet. Pt states it had been intermittent for approximately one month then became constant. L worse than R. Pt denies any change in meds / hx.

## 2019-02-17 NOTE — ED Provider Notes (Signed)
Medical screening examination/treatment/procedure(s) were conducted as a shared visit with non-physician practitioner(s) and myself.  I personally evaluated the patient during the encounter.     Apiration of blood Performed by: Jola Schmidt Consent obtained. Required items: required blood products, implants, devices, and special equipment available Patient identity confirmed: verbally with patient Time out: Immediately prior to procedure a "time out" was called to verify the correct patient, procedure, equipment, support staff and site/side marked as required. Preparation: Patient was prepped and draped in the usual sterile fashion. Patient tolerance: Patient tolerated the procedure well with no immediate complications. ULTRASOUND UTILIZED TO VISUALIZE VESSEL Location of aspiration: Right AC   Peripheral edema.  Recommend elevation and compression stockings.  DVT study negative for acute clot.  Kidney function is normal.  Dietary instructions given      Jola Schmidt, MD 02/17/19 1728

## 2019-02-17 NOTE — Discharge Instructions (Addendum)
Elevate your legs  Wear compression stockings  Avoid processed foods and fast food.   Don't drink sodas

## 2019-02-17 NOTE — Progress Notes (Signed)
[place] lower extremity venous duplex completed. Preliminary results in Chart review CV Proc. [place] Sophiamarie Nease,RVS 02/17/2019, 2:48 PM

## 2019-02-20 ENCOUNTER — Other Ambulatory Visit (HOSPITAL_COMMUNITY): Payer: Self-pay | Admitting: *Deleted

## 2019-02-20 DIAGNOSIS — N644 Mastodynia: Secondary | ICD-10-CM

## 2019-05-03 ENCOUNTER — Ambulatory Visit (HOSPITAL_COMMUNITY): Payer: Self-pay

## 2019-05-03 ENCOUNTER — Other Ambulatory Visit: Payer: Self-pay

## 2019-09-19 ENCOUNTER — Emergency Department (HOSPITAL_COMMUNITY): Payer: Self-pay

## 2019-09-19 ENCOUNTER — Other Ambulatory Visit: Payer: Self-pay

## 2019-09-19 ENCOUNTER — Inpatient Hospital Stay (HOSPITAL_COMMUNITY)
Admission: EM | Admit: 2019-09-19 | Discharge: 2019-09-21 | DRG: 871 | Disposition: A | Discharge status: Home / Self Care | Payer: Self-pay | Attending: Internal Medicine | Admitting: Internal Medicine

## 2019-09-19 DIAGNOSIS — F329 Major depressive disorder, single episode, unspecified: Secondary | ICD-10-CM

## 2019-09-19 DIAGNOSIS — I10 Essential (primary) hypertension: Secondary | ICD-10-CM | POA: Diagnosis present

## 2019-09-19 DIAGNOSIS — Z7951 Long term (current) use of inhaled steroids: Secondary | ICD-10-CM

## 2019-09-19 DIAGNOSIS — A419 Sepsis, unspecified organism: Principal | ICD-10-CM

## 2019-09-19 DIAGNOSIS — I509 Heart failure, unspecified: Secondary | ICD-10-CM

## 2019-09-19 DIAGNOSIS — I2510 Atherosclerotic heart disease of native coronary artery without angina pectoris: Secondary | ICD-10-CM

## 2019-09-19 DIAGNOSIS — Z91041 Radiographic dye allergy status: Secondary | ICD-10-CM

## 2019-09-19 DIAGNOSIS — K760 Fatty (change of) liver, not elsewhere classified: Secondary | ICD-10-CM | POA: Diagnosis present

## 2019-09-19 DIAGNOSIS — Z20822 Contact with and (suspected) exposure to covid-19: Secondary | ICD-10-CM | POA: Diagnosis present

## 2019-09-19 DIAGNOSIS — Z7902 Long term (current) use of antithrombotics/antiplatelets: Secondary | ICD-10-CM

## 2019-09-19 DIAGNOSIS — J45909 Unspecified asthma, uncomplicated: Secondary | ICD-10-CM

## 2019-09-19 DIAGNOSIS — Z7982 Long term (current) use of aspirin: Secondary | ICD-10-CM

## 2019-09-19 DIAGNOSIS — K219 Gastro-esophageal reflux disease without esophagitis: Secondary | ICD-10-CM

## 2019-09-19 DIAGNOSIS — I252 Old myocardial infarction: Secondary | ICD-10-CM

## 2019-09-19 DIAGNOSIS — R918 Other nonspecific abnormal finding of lung field: Secondary | ICD-10-CM

## 2019-09-19 DIAGNOSIS — E1165 Type 2 diabetes mellitus with hyperglycemia: Secondary | ICD-10-CM

## 2019-09-19 DIAGNOSIS — Z833 Family history of diabetes mellitus: Secondary | ICD-10-CM

## 2019-09-19 DIAGNOSIS — N390 Urinary tract infection, site not specified: Secondary | ICD-10-CM

## 2019-09-19 DIAGNOSIS — J189 Pneumonia, unspecified organism: Secondary | ICD-10-CM | POA: Diagnosis present

## 2019-09-19 DIAGNOSIS — Z794 Long term (current) use of insulin: Secondary | ICD-10-CM

## 2019-09-19 DIAGNOSIS — D72829 Elevated white blood cell count, unspecified: Secondary | ICD-10-CM

## 2019-09-19 DIAGNOSIS — F17210 Nicotine dependence, cigarettes, uncomplicated: Secondary | ICD-10-CM

## 2019-09-19 DIAGNOSIS — Z79899 Other long term (current) drug therapy: Secondary | ICD-10-CM

## 2019-09-19 DIAGNOSIS — E7800 Pure hypercholesterolemia, unspecified: Secondary | ICD-10-CM | POA: Diagnosis present

## 2019-09-19 DIAGNOSIS — R319 Hematuria, unspecified: Secondary | ICD-10-CM | POA: Diagnosis present

## 2019-09-19 DIAGNOSIS — Z91018 Allergy to other foods: Secondary | ICD-10-CM

## 2019-09-19 DIAGNOSIS — N179 Acute kidney failure, unspecified: Secondary | ICD-10-CM

## 2019-09-19 DIAGNOSIS — I110 Hypertensive heart disease with heart failure: Secondary | ICD-10-CM

## 2019-09-19 DIAGNOSIS — E785 Hyperlipidemia, unspecified: Secondary | ICD-10-CM

## 2019-09-19 DIAGNOSIS — E861 Hypovolemia: Secondary | ICD-10-CM | POA: Diagnosis present

## 2019-09-19 LAB — URINALYSIS, ROUTINE W REFLEX MICROSCOPIC
Bilirubin Urine: NEGATIVE
Glucose, UA: >=500 mg/dL — AB
Ketones, ur: 5 mg/dL — AB
Nitrite: POSITIVE — AB
Protein, ur: 100 mg/dL — AB
RBC / HPF: >50 RBC/hpf — ABNORMAL HIGH (ref 0–5)
Specific Gravity, Urine: 1.026 (ref 1.005–1.030)
pH: 5 (ref 5.0–8.0)

## 2019-09-19 LAB — CBC
HCT: 29.1 % — ABNORMAL LOW (ref 36.0–46.0)
HCT: 27.9 % — ABNORMAL LOW (ref 36.0–46.0)
Hemoglobin: 9.2 g/dL — ABNORMAL LOW (ref 12.0–15.0)
Hemoglobin: 8.9 g/dL — ABNORMAL LOW (ref 12.0–15.0)
MCH: 28.1 pg (ref 26.0–34.0)
MCH: 27.9 pg (ref 26.0–34.0)
MCHC: 31.6 g/dL (ref 30.0–36.0)
MCHC: 31.9 g/dL (ref 30.0–36.0)
MCV: 89 fL (ref 80.0–100.0)
MCV: 87.5 fL (ref 80.0–100.0)
Platelets: 203 10*3/uL (ref 150–400)
Platelets: 193 10*3/uL (ref 150–400)
RBC: 3.27 MIL/uL — ABNORMAL LOW (ref 3.87–5.11)
RBC: 3.19 MIL/uL — ABNORMAL LOW (ref 3.87–5.11)
RDW: 18.3 % — ABNORMAL HIGH (ref 11.5–15.5)
RDW: 18.3 % — ABNORMAL HIGH (ref 11.5–15.5)
WBC: 22.6 10*3/uL — ABNORMAL HIGH (ref 4.0–10.5)
WBC: 20.5 10*3/uL — ABNORMAL HIGH (ref 4.0–10.5)
nRBC: 0 % (ref 0.0–0.2)
nRBC: 0 % (ref 0.0–0.2)

## 2019-09-19 LAB — POCT I-STAT EG7
Acid-base deficit: 5 mmol/L — ABNORMAL HIGH (ref 0.0–2.0)
Bicarbonate: 20.1 mmol/L (ref 20.0–28.0)
Calcium, Ion: 1.1 mmol/L — ABNORMAL LOW (ref 1.15–1.40)
HCT: 28 % — ABNORMAL LOW (ref 36.0–46.0)
Hemoglobin: 9.5 g/dL — ABNORMAL LOW (ref 12.0–15.0)
O2 Saturation: 87 %
Potassium: 4.1 mmol/L (ref 3.5–5.1)
Sodium: 135 mmol/L (ref 135–145)
TCO2: 21 mmol/L — ABNORMAL LOW (ref 22–32)
pCO2, Ven: 35.1 mmHg — ABNORMAL LOW (ref 44.0–60.0)
pH, Ven: 7.365 (ref 7.250–7.430)
pO2, Ven: 53 mmHg — ABNORMAL HIGH (ref 32.0–45.0)

## 2019-09-19 LAB — BASIC METABOLIC PANEL
Anion gap: 17 — ABNORMAL HIGH (ref 5–15)
Anion gap: 14 (ref 5–15)
Anion gap: 12 (ref 5–15)
BUN: 17 mg/dL (ref 6–20)
BUN: 16 mg/dL (ref 6–20)
BUN: 12 mg/dL (ref 6–20)
CO2: 15 mmol/L — ABNORMAL LOW (ref 22–32)
CO2: 17 mmol/L — ABNORMAL LOW (ref 22–32)
CO2: 20 mmol/L — ABNORMAL LOW (ref 22–32)
Calcium: 8.8 mg/dL — ABNORMAL LOW (ref 8.9–10.3)
Calcium: 8.3 mg/dL — ABNORMAL LOW (ref 8.9–10.3)
Calcium: 8.6 mg/dL — ABNORMAL LOW (ref 8.9–10.3)
Chloride: 101 mmol/L (ref 98–111)
Chloride: 102 mmol/L (ref 98–111)
Chloride: 106 mmol/L (ref 98–111)
Creatinine, Ser: 1.34 mg/dL — ABNORMAL HIGH (ref 0.44–1.00)
Creatinine, Ser: 1.42 mg/dL — ABNORMAL HIGH (ref 0.44–1.00)
Creatinine, Ser: 1.08 mg/dL — ABNORMAL HIGH (ref 0.44–1.00)
GFR calc Af Amer: 55 mL/min — ABNORMAL LOW (ref >60)
GFR calc Af Amer: 51 mL/min — ABNORMAL LOW (ref >60)
GFR calc Af Amer: >60 mL/min (ref >60)
GFR calc non Af Amer: 47 mL/min — ABNORMAL LOW (ref >60)
GFR calc non Af Amer: 44 mL/min — ABNORMAL LOW (ref >60)
GFR calc non Af Amer: >60 mL/min (ref >60)
Glucose, Bld: 377 mg/dL — ABNORMAL HIGH (ref 70–99)
Glucose, Bld: 351 mg/dL — ABNORMAL HIGH (ref 70–99)
Glucose, Bld: 165 mg/dL — ABNORMAL HIGH (ref 70–99)
Potassium: 4.6 mmol/L (ref 3.5–5.1)
Potassium: 4.1 mmol/L (ref 3.5–5.1)
Potassium: 3.7 mmol/L (ref 3.5–5.1)
Sodium: 133 mmol/L — ABNORMAL LOW (ref 135–145)
Sodium: 133 mmol/L — ABNORMAL LOW (ref 135–145)
Sodium: 138 mmol/L (ref 135–145)

## 2019-09-19 LAB — HEPATIC FUNCTION PANEL
ALT: 14 U/L (ref 0–44)
AST: 19 U/L (ref 15–41)
Albumin: 3.6 g/dL (ref 3.5–5.0)
Alkaline Phosphatase: 79 U/L (ref 38–126)
Bilirubin, Direct: <0.1 mg/dL (ref 0.0–0.2)
Total Bilirubin: 0.6 mg/dL (ref 0.3–1.2)
Total Protein: 6.9 g/dL (ref 6.5–8.1)

## 2019-09-19 LAB — GLUCOSE, CAPILLARY
Glucose-Capillary: 166 mg/dL — ABNORMAL HIGH (ref 70–99)
Glucose-Capillary: 171 mg/dL — ABNORMAL HIGH (ref 70–99)
Glucose-Capillary: 131 mg/dL — ABNORMAL HIGH (ref 70–99)

## 2019-09-19 LAB — COMPREHENSIVE METABOLIC PANEL
ALT: 13 U/L (ref 0–44)
AST: 14 U/L — ABNORMAL LOW (ref 15–41)
Albumin: 3.2 g/dL — ABNORMAL LOW (ref 3.5–5.0)
Alkaline Phosphatase: 67 U/L (ref 38–126)
Anion gap: 11 (ref 5–15)
BUN: 11 mg/dL (ref 6–20)
CO2: 20 mmol/L — ABNORMAL LOW (ref 22–32)
Calcium: 8.5 mg/dL — ABNORMAL LOW (ref 8.9–10.3)
Chloride: 107 mmol/L (ref 98–111)
Creatinine, Ser: 0.99 mg/dL (ref 0.44–1.00)
GFR calc Af Amer: >60 mL/min (ref >60)
GFR calc non Af Amer: >60 mL/min (ref >60)
Glucose, Bld: 120 mg/dL — ABNORMAL HIGH (ref 70–99)
Potassium: 3.5 mmol/L (ref 3.5–5.1)
Sodium: 138 mmol/L (ref 135–145)
Total Bilirubin: 0.4 mg/dL (ref 0.3–1.2)
Total Protein: 6.7 g/dL (ref 6.5–8.1)

## 2019-09-19 LAB — RESPIRATORY PANEL BY RT PCR (FLU A&B, COVID)
Influenza A by PCR: NEGATIVE
Influenza B by PCR: NEGATIVE
SARS Coronavirus 2 by RT PCR: NEGATIVE

## 2019-09-19 LAB — CBC WITH DIFFERENTIAL/PLATELET
Abs Immature Granulocytes: 0.18 10*3/uL — ABNORMAL HIGH (ref 0.00–0.07)
Basophils Absolute: 0 10*3/uL (ref 0.0–0.1)
Basophils Relative: 0 %
Eosinophils Absolute: 0 10*3/uL (ref 0.0–0.5)
Eosinophils Relative: 0 %
HCT: 29.3 % — ABNORMAL LOW (ref 36.0–46.0)
Hemoglobin: 9.4 g/dL — ABNORMAL LOW (ref 12.0–15.0)
Immature Granulocytes: 1 %
Lymphocytes Relative: 5 %
Lymphs Abs: 1.2 10*3/uL (ref 0.7–4.0)
MCH: 28.4 pg (ref 26.0–34.0)
MCHC: 32.1 g/dL (ref 30.0–36.0)
MCV: 88.5 fL (ref 80.0–100.0)
Monocytes Absolute: 1.2 10*3/uL — ABNORMAL HIGH (ref 0.1–1.0)
Monocytes Relative: 5 %
Neutro Abs: 21 10*3/uL — ABNORMAL HIGH (ref 1.7–7.7)
Neutrophils Relative %: 89 %
Platelets: 205 10*3/uL (ref 150–400)
RBC: 3.31 MIL/uL — ABNORMAL LOW (ref 3.87–5.11)
RDW: 18.3 % — ABNORMAL HIGH (ref 11.5–15.5)
WBC: 23.6 10*3/uL — ABNORMAL HIGH (ref 4.0–10.5)
nRBC: 0 % (ref 0.0–0.2)

## 2019-09-19 LAB — TROPONIN I (HIGH SENSITIVITY)
Troponin I (High Sensitivity): 14 ng/L (ref <18)
Troponin I (High Sensitivity): 12 ng/L (ref <18)

## 2019-09-19 LAB — LACTIC ACID, PLASMA
Lactic Acid, Venous: 4.1 mmol/L (ref 0.5–1.9)
Lactic Acid, Venous: 2.8 mmol/L (ref 0.5–1.9)

## 2019-09-19 LAB — CBG MONITORING, ED
Glucose-Capillary: 367 mg/dL — ABNORMAL HIGH (ref 70–99)
Glucose-Capillary: 313 mg/dL — ABNORMAL HIGH (ref 70–99)
Glucose-Capillary: 182 mg/dL — ABNORMAL HIGH (ref 70–99)

## 2019-09-19 LAB — I-STAT BETA HCG BLOOD, ED (MC, WL, AP ONLY): I-stat hCG, quantitative: <5 m[IU]/mL (ref <5)

## 2019-09-19 LAB — POC SARS CORONAVIRUS 2 AG -  ED
SARS Coronavirus 2 Ag: NEGATIVE
SARS Coronavirus 2 Ag: NEGATIVE

## 2019-09-19 LAB — HIV ANTIBODY (ROUTINE TESTING W REFLEX): HIV Screen 4th Generation wRfx: NONREACTIVE

## 2019-09-19 LAB — BETA-HYDROXYBUTYRIC ACID
Beta-Hydroxybutyric Acid: 0.08 mmol/L (ref 0.05–0.27)
Beta-Hydroxybutyric Acid: 0.06 mmol/L (ref 0.05–0.27)

## 2019-09-19 LAB — CK: Total CK: 199 U/L (ref 38–234)

## 2019-09-19 LAB — BRAIN NATRIURETIC PEPTIDE: B Natriuretic Peptide: 58.4 pg/mL (ref 0.0–100.0)

## 2019-09-19 LAB — D-DIMER, QUANTITATIVE: D-Dimer, Quant: 0.49 ug/mL-FEU (ref 0.00–0.50)

## 2019-09-19 IMAGING — CT CT CHEST W/O CM
2 of 4 series · 15 of 46 positions shown, 17 images · non-contrast
Comparison: Single-view of the chest earlier today.

CLINICAL DATA: Shortness of breath, chest pain, tachycardia and
right flank pain. Elevated white blood cell count.

EXAM:
CT CHEST, ABDOMEN AND PELVIS WITHOUT CONTRAST
TECHNIQUE: Multidetector CT imaging of the chest, abdomen and pelvis was
performed following the standard protocol without IV contrast.

[Series 3: cap without · axial · non-contrast · 0.80mm/px · z∈[+792,+1316]mm · 12 of 121 slices shown, 14 images]
[im 8/121  soft-tissue]
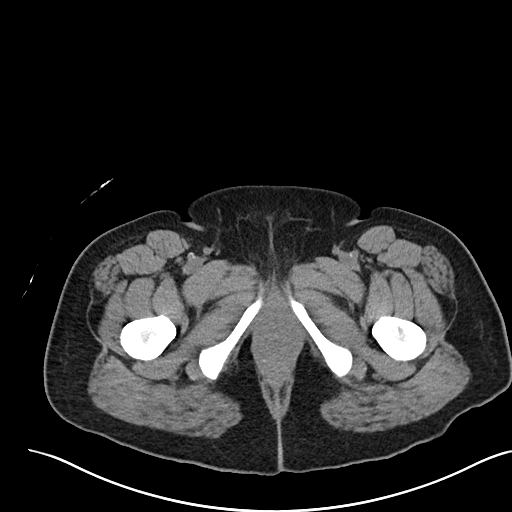
[im 8/121  bone]
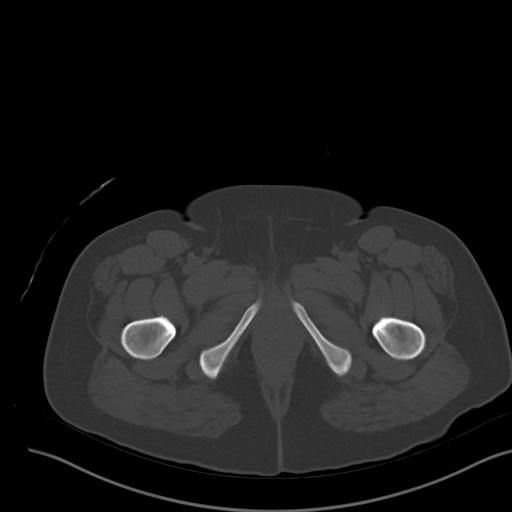
[im 22/121  soft-tissue]
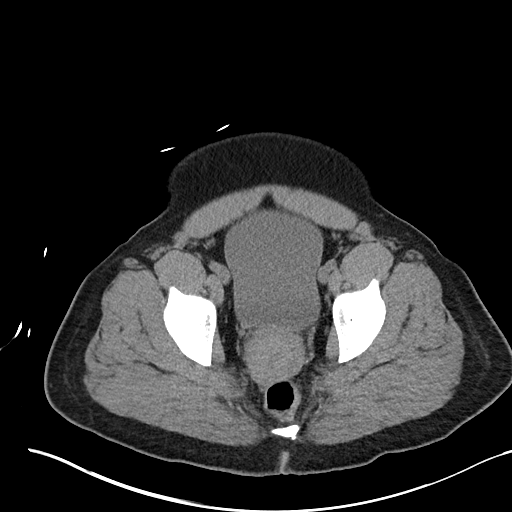
[im 29/121  soft-tissue]
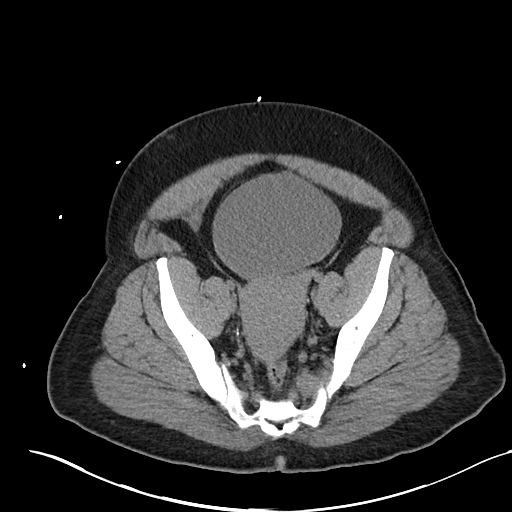
[im 36/121  soft-tissue]
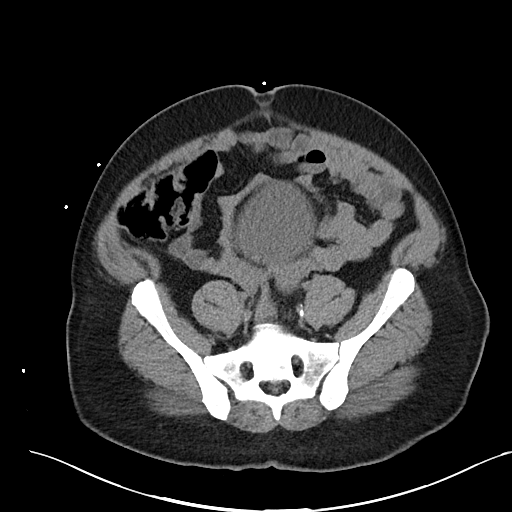
[im 50/121  soft-tissue]
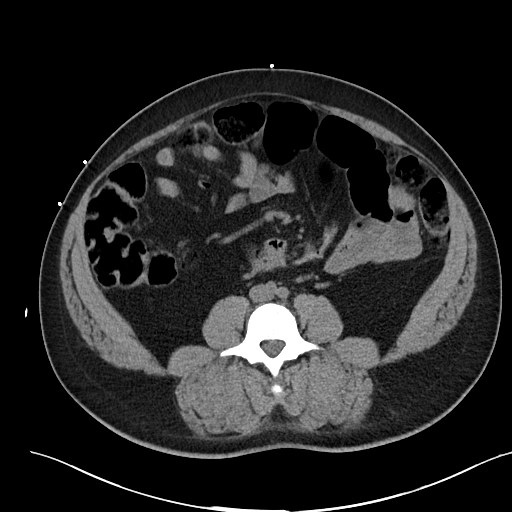
[im 57/121  soft-tissue]
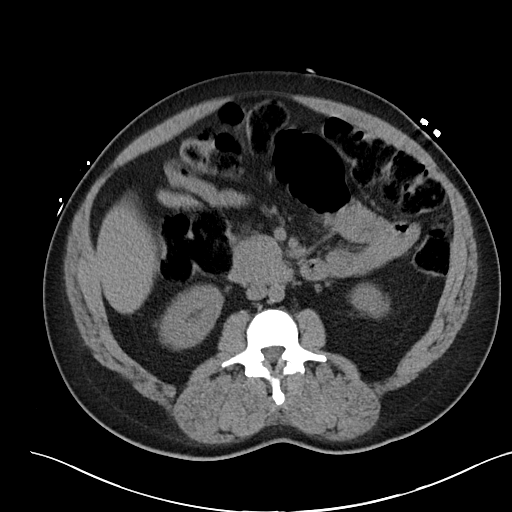
[im 64/121  soft-tissue]
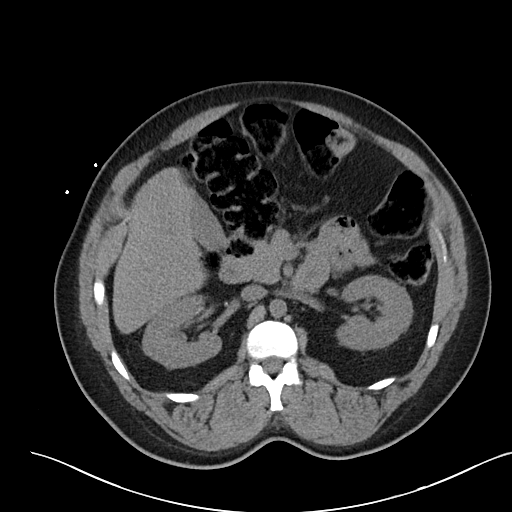
[im 78/121  soft-tissue]
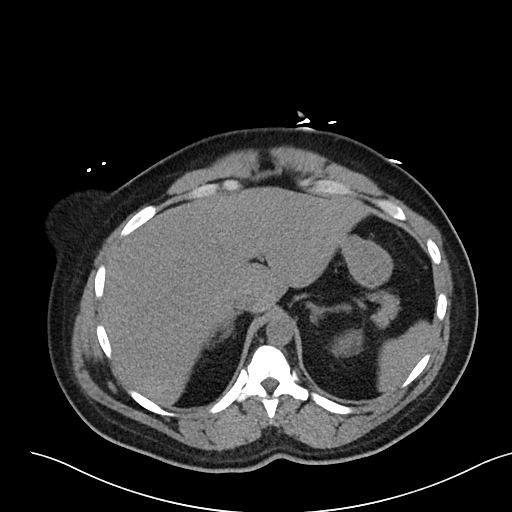
[im 85/121  soft-tissue]
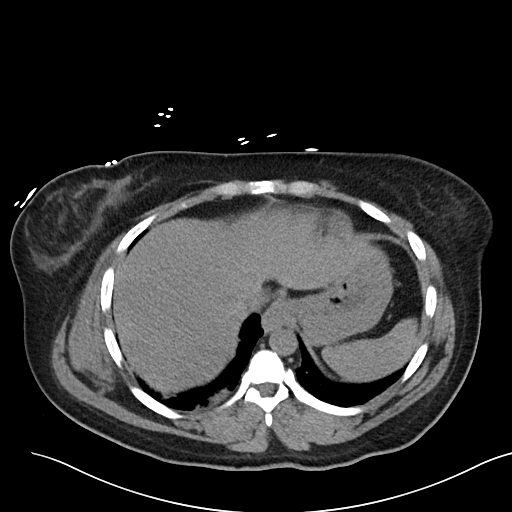
[im 85/121  bone]
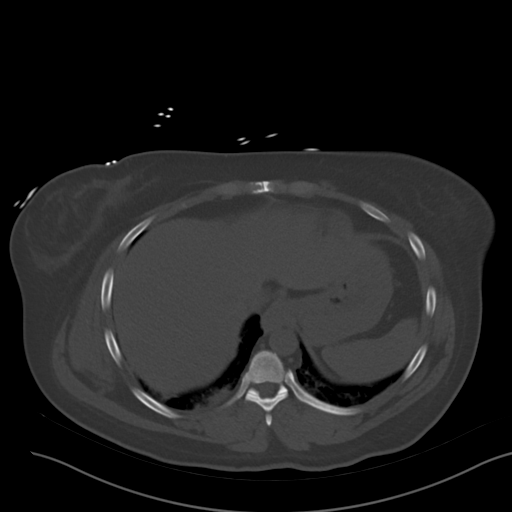
[im 92/121  soft-tissue]
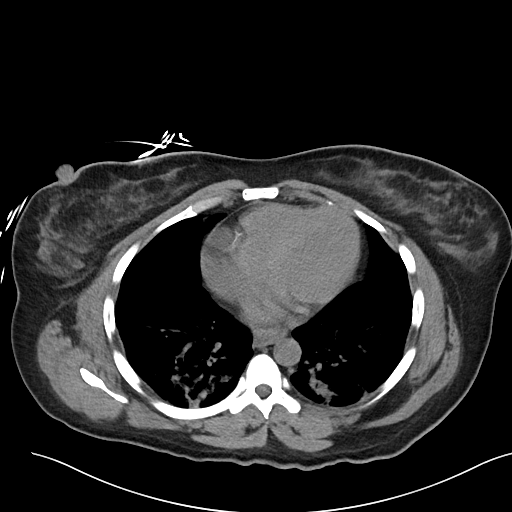
[im 106/121  soft-tissue]
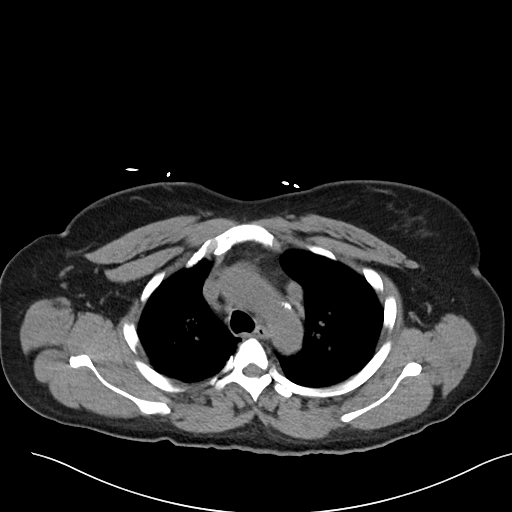
[im 113/121  soft-tissue]
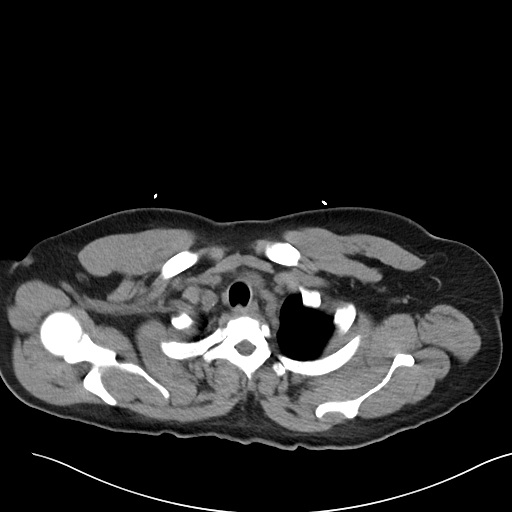

[Series 6: cor · coronal · 0.82mm/px · 3 of 113 slices shown]
[im 38/113  soft-tissue]
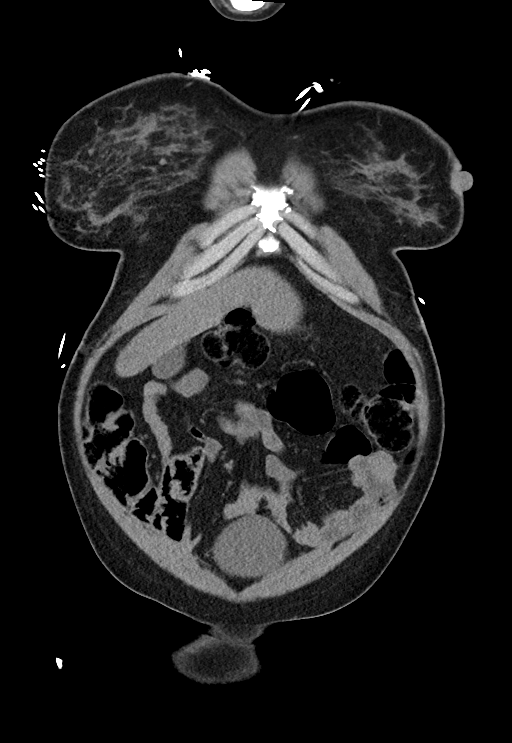
[im 50/113  soft-tissue]
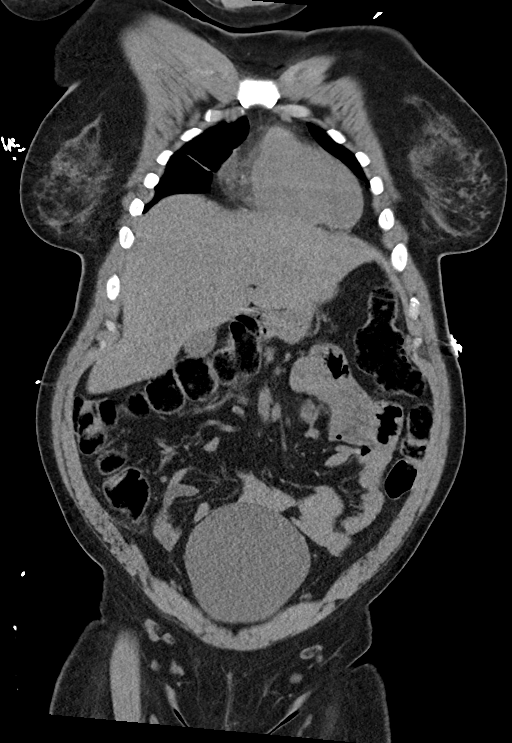
[im 63/113  soft-tissue]
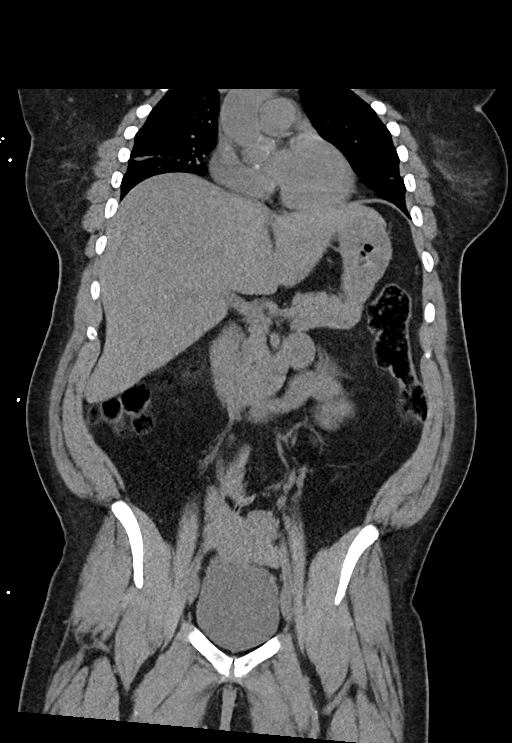

[15 of 46 positions shown; findings below may reference images not displayed]

FINDINGS: CT CHEST FINDINGS

Cardiovascular: No significant vascular findings. Normal heart size.
No pericardial effusion.

Mediastinum/Nodes: No enlarged mediastinal, hilar, or axillary lymph
nodes. Thyroid gland, trachea, and esophagus demonstrate no
significant findings.

Lungs/Pleura: Dependent airspace disease in the lower lobes
bilaterally is worse on the right and could be due to atelectasis or
pneumonia. Atelectatic change is seen in the lingula and right
middle lobe. Scattered, mild ground-glass attenuation is noted.

Musculoskeletal: No acute or focal abnormality.

CT ABDOMEN PELVIS FINDINGS

Hepatobiliary: No focal liver abnormality is seen. No gallstones,
gallbladder wall thickening, or biliary dilatation. Fatty
infiltration of liver is noted.

Pancreas: Unremarkable. No pancreatic ductal dilatation or
surrounding inflammatory changes.

Spleen: Normal in size without focal abnormality.

Adrenals/Urinary Tract: The adrenal glands appear normal. Vascular
calcifications are present in both kidneys. No stone or focal renal
lesion is identified. Ureters and urinary bladder appear normal.

Stomach/Bowel: Stomach is within normal limits. Appendix appears
normal. No evidence of bowel wall thickening, distention, or
inflammatory changes.

Vascular/Lymphatic: Scattered atherosclerosis.  No lymphadenopathy.

Reproductive: Uterus and bilateral adnexa are unremarkable.

Other: Small fat containing umbilical hernia.

Musculoskeletal: Negative.
IMPRESSION: Dependent airspace disease bilaterally is worse on the right and
could be due to atelectasis but may also represent pneumonia,
particularly in the right lower lobe.

No acute abnormality abdomen or pelvis. Negative for urinary tract
stone.

Atherosclerosis.

Fatty infiltration of the liver.

Small fat containing umbilical hernia.

## 2019-09-19 IMAGING — DX DG CHEST 1V PORT
1 series · 1 of 1 positions shown · non-contrast
Comparison: [DATE]

CLINICAL DATA: Febrile. Tachycardia.

EXAM:
PORTABLE CHEST 1 VIEW

[chest ap]
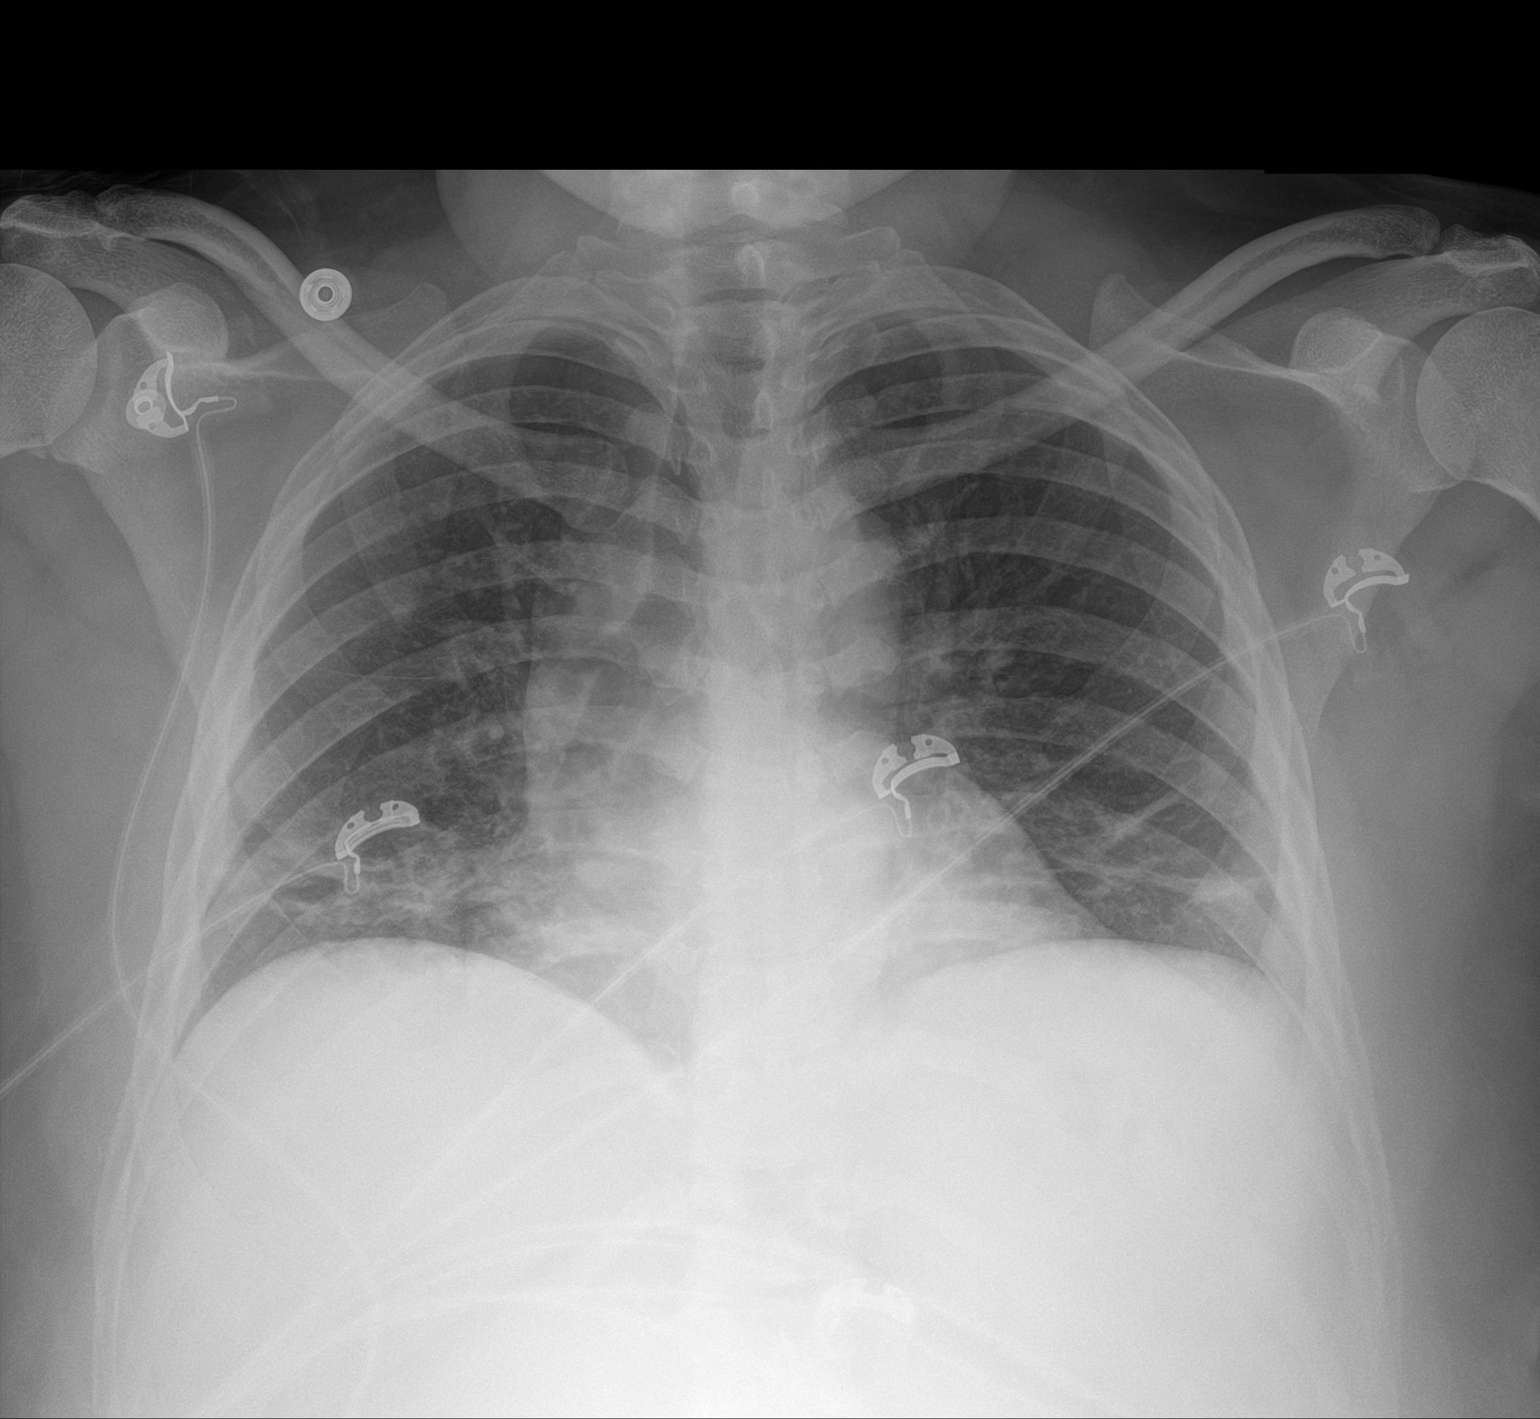

[1 of 1 positions shown; findings below may reference images not displayed]

FINDINGS: Heart size is normal. No pleural effusion or edema. No airspace
opacities identified. Platelike atelectasis in both lung bases.
IMPRESSION: Bibasilar atelectasis.

## 2019-09-19 IMAGING — CT CT ABD-PELV W/O CM
2 of 4 series · 15 of 46 positions shown, 17 images · non-contrast
Comparison: Single-view of the chest earlier today.

CLINICAL DATA: Shortness of breath, chest pain, tachycardia and
right flank pain. Elevated white blood cell count.

EXAM:
CT CHEST, ABDOMEN AND PELVIS WITHOUT CONTRAST
TECHNIQUE: Multidetector CT imaging of the chest, abdomen and pelvis was
performed following the standard protocol without IV contrast.

[Series 3: cap without · axial · non-contrast · 0.80mm/px · z∈[+792,+1316]mm · 12 of 121 slices shown, 14 images]
[im 8/121  soft-tissue]
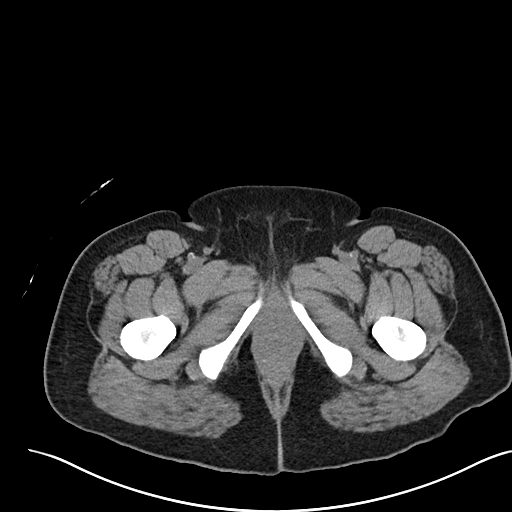
[im 8/121  bone]
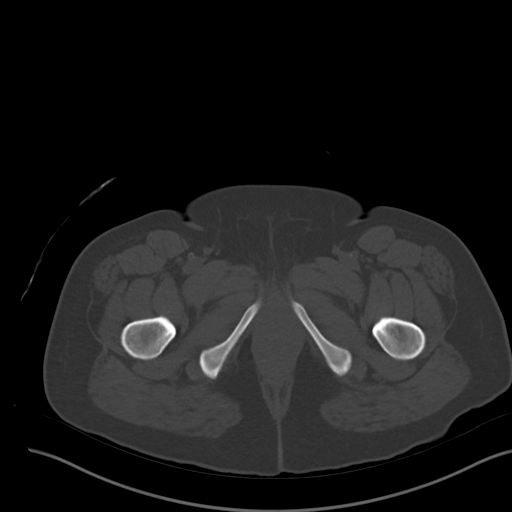
[im 22/121  soft-tissue]
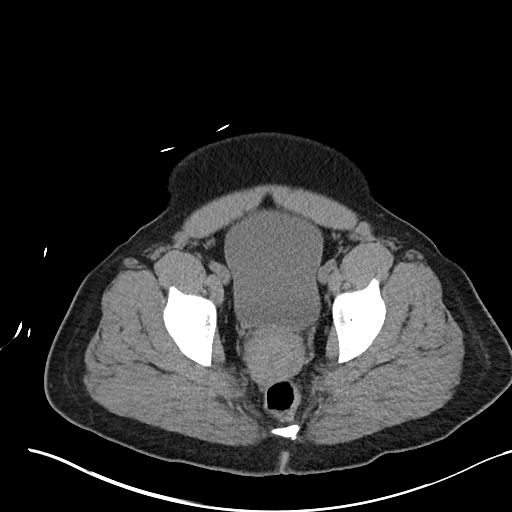
[im 29/121  soft-tissue]
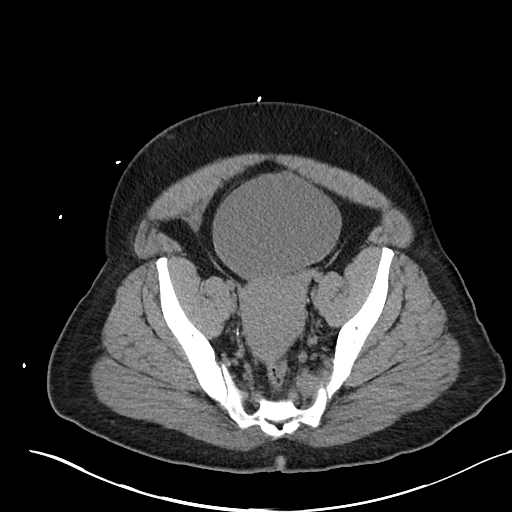
[im 36/121  soft-tissue]
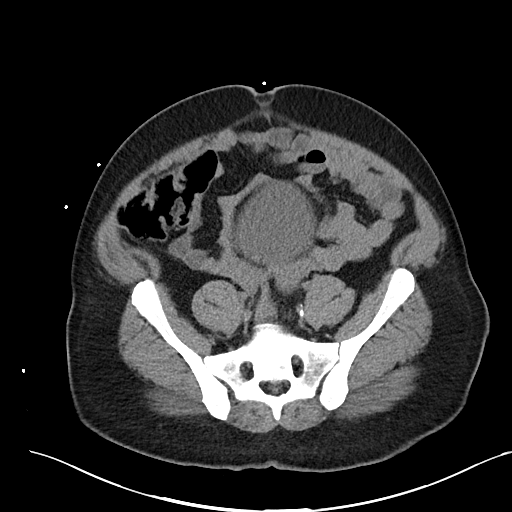
[im 50/121  soft-tissue]
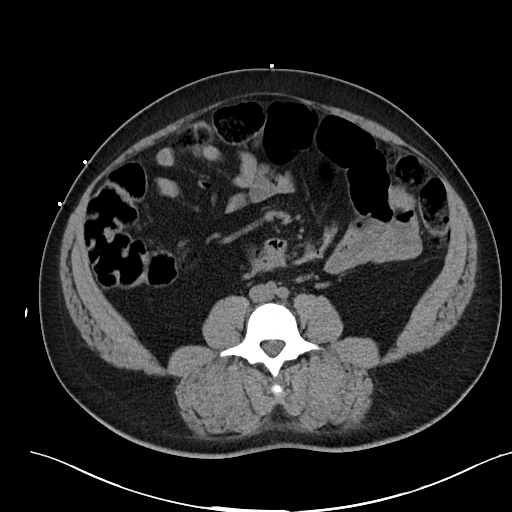
[im 57/121  soft-tissue]
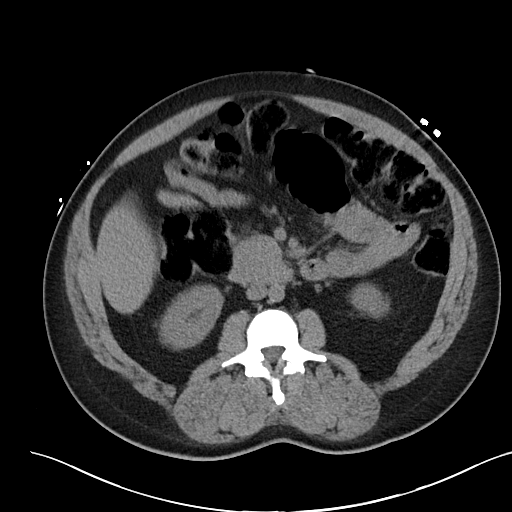
[im 64/121  soft-tissue]
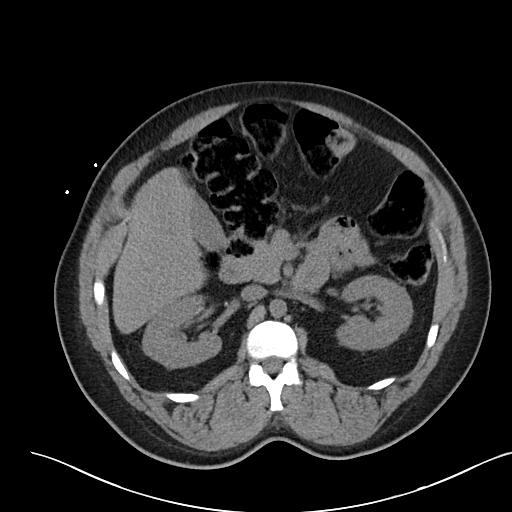
[im 78/121  soft-tissue]
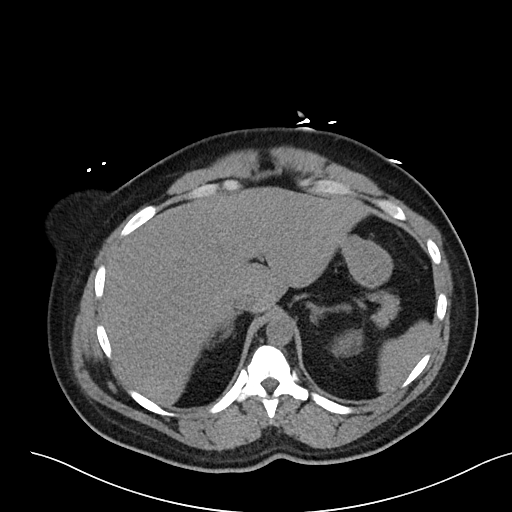
[im 85/121  soft-tissue]
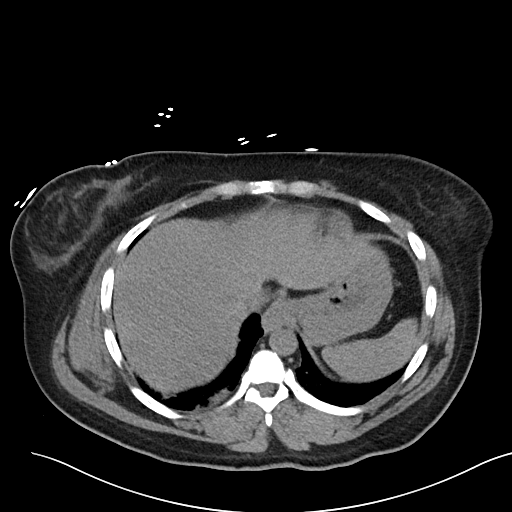
[im 85/121  bone]
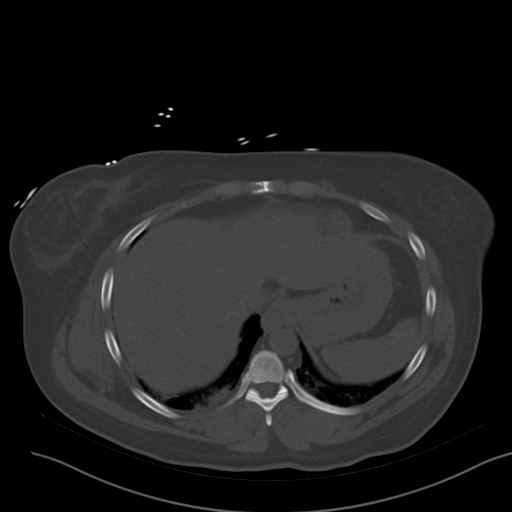
[im 92/121  soft-tissue]
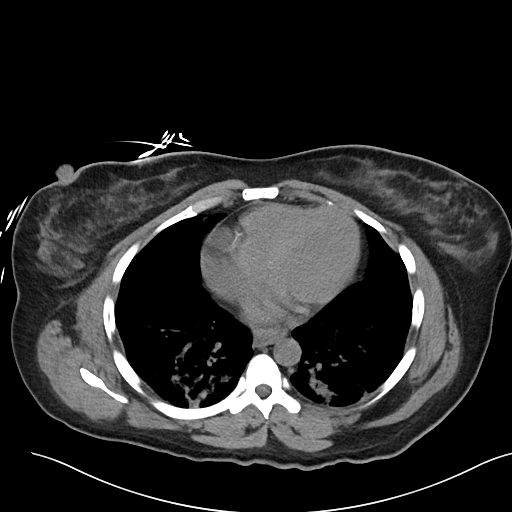
[im 106/121  soft-tissue]
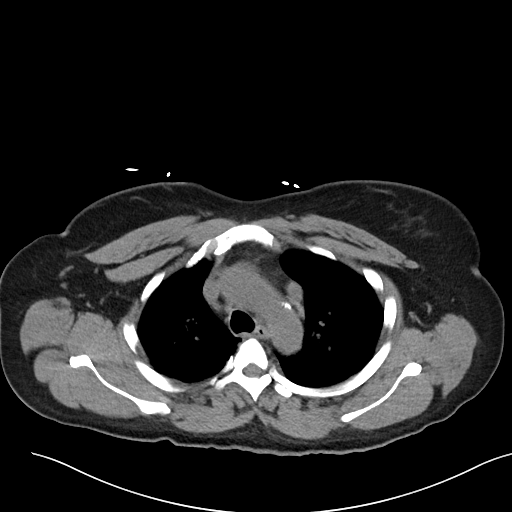
[im 113/121  soft-tissue]
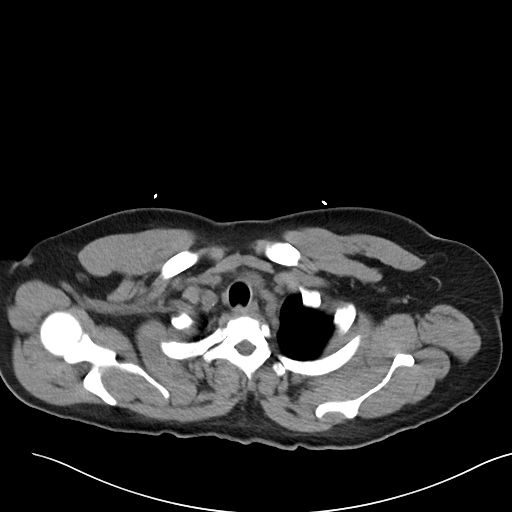

[Series 6: cor · coronal · 0.82mm/px · 3 of 113 slices shown]
[im 38/113  soft-tissue]
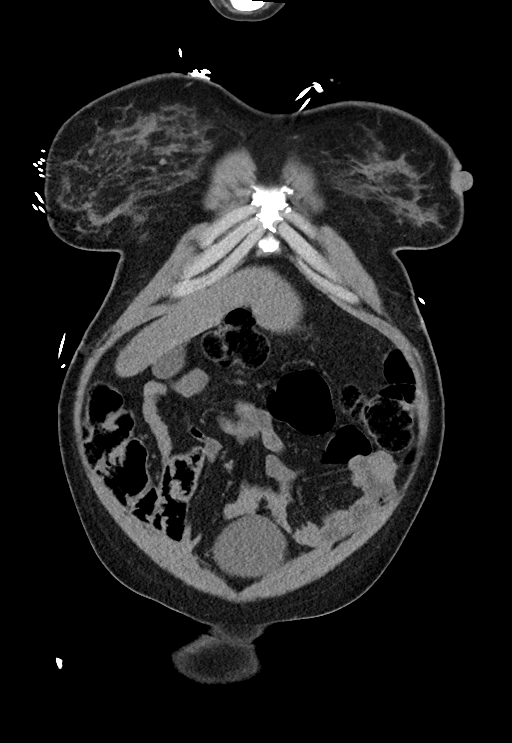
[im 50/113  soft-tissue]
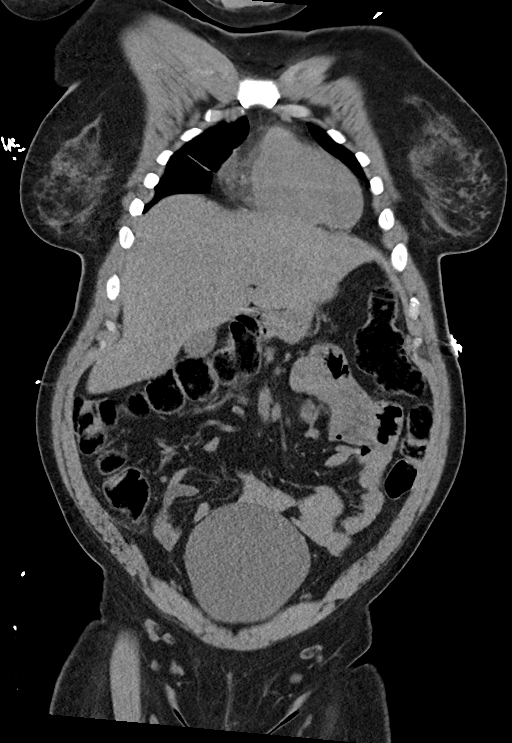
[im 63/113  soft-tissue]
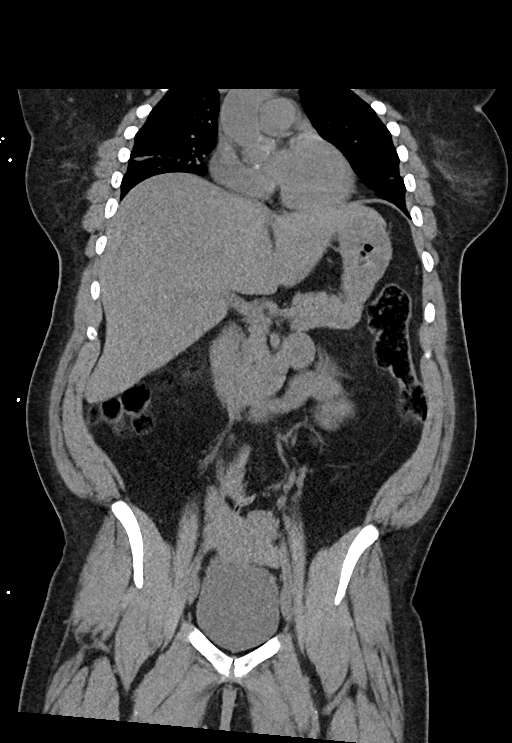

[15 of 46 positions shown; findings below may reference images not displayed]

FINDINGS: CT CHEST FINDINGS

Cardiovascular: No significant vascular findings. Normal heart size.
No pericardial effusion.

Mediastinum/Nodes: No enlarged mediastinal, hilar, or axillary lymph
nodes. Thyroid gland, trachea, and esophagus demonstrate no
significant findings.

Lungs/Pleura: Dependent airspace disease in the lower lobes
bilaterally is worse on the right and could be due to atelectasis or
pneumonia. Atelectatic change is seen in the lingula and right
middle lobe. Scattered, mild ground-glass attenuation is noted.

Musculoskeletal: No acute or focal abnormality.

CT ABDOMEN PELVIS FINDINGS

Hepatobiliary: No focal liver abnormality is seen. No gallstones,
gallbladder wall thickening, or biliary dilatation. Fatty
infiltration of liver is noted.

Pancreas: Unremarkable. No pancreatic ductal dilatation or
surrounding inflammatory changes.

Spleen: Normal in size without focal abnormality.

Adrenals/Urinary Tract: The adrenal glands appear normal. Vascular
calcifications are present in both kidneys. No stone or focal renal
lesion is identified. Ureters and urinary bladder appear normal.

Stomach/Bowel: Stomach is within normal limits. Appendix appears
normal. No evidence of bowel wall thickening, distention, or
inflammatory changes.

Vascular/Lymphatic: Scattered atherosclerosis.  No lymphadenopathy.

Reproductive: Uterus and bilateral adnexa are unremarkable.

Other: Small fat containing umbilical hernia.

Musculoskeletal: Negative.
IMPRESSION: Dependent airspace disease bilaterally is worse on the right and
could be due to atelectasis but may also represent pneumonia,
particularly in the right lower lobe.

No acute abnormality abdomen or pelvis. Negative for urinary tract
stone.

Atherosclerosis.

Fatty infiltration of the liver.

Small fat containing umbilical hernia.

## 2019-09-19 MED ORDER — POTASSIUM CHLORIDE 10 MEQ/100ML IV SOLN
10.0000 meq | INTRAVENOUS | Status: AC
  Administered 2019-09-19: 16:00:00 10 meq via INTRAVENOUS
  Filled 2019-09-19 (×2): qty 100

## 2019-09-19 MED ORDER — ASPIRIN 81 MG PO CHEW
81.0000 mg | CHEWABLE_TABLET | Freq: Every day | ORAL | Status: DC
  Administered 2019-09-20 – 2019-09-21 (×2): 81 mg via ORAL
  Filled 2019-09-19 (×2): qty 1

## 2019-09-19 MED ORDER — SODIUM CHLORIDE 0.9 % IV BOLUS
1000.0000 mL | Freq: Once | INTRAVENOUS | Status: AC
  Administered 2019-09-19: 1000 mL via INTRAVENOUS

## 2019-09-19 MED ORDER — LIDOCAINE 5 % EX PTCH
1.0000 | MEDICATED_PATCH | CUTANEOUS | Status: DC
  Administered 2019-09-19 – 2019-09-20 (×2): 1 via TRANSDERMAL
  Filled 2019-09-19 (×3): qty 1

## 2019-09-19 MED ORDER — CLONIDINE HCL 0.1 MG PO TABS
0.1000 mg | ORAL_TABLET | Freq: Two times a day (BID) | ORAL | Status: DC
  Administered 2019-09-19 – 2019-09-21 (×4): 0.1 mg via ORAL
  Filled 2019-09-19 (×4): qty 1

## 2019-09-19 MED ORDER — ACETAMINOPHEN 325 MG PO TABS: 650.0000 mg | ORAL_TABLET | Freq: Four times a day (QID) | ORAL | Status: DC | PRN

## 2019-09-19 MED ORDER — DEXTROSE-NACL 5-0.45 % IV SOLN: INTRAVENOUS | Status: DC

## 2019-09-19 MED ORDER — FENTANYL CITRATE (PF) 100 MCG/2ML IJ SOLN
50.0000 ug | Freq: Once | INTRAMUSCULAR | Status: AC
  Administered 2019-09-19: 14:00:00 50 ug via INTRAVENOUS
  Filled 2019-09-19: qty 2

## 2019-09-19 MED ORDER — INSULIN NPH (HUMAN) (ISOPHANE) 100 UNIT/ML ~~LOC~~ SUSP
35.0000 [IU] | Freq: Two times a day (BID) | SUBCUTANEOUS | Status: DC
  Administered 2019-09-19: 35 [IU] via SUBCUTANEOUS
  Filled 2019-09-19: qty 10

## 2019-09-19 MED ORDER — INSULIN ASPART 100 UNIT/ML ~~LOC~~ SOLN: 0.0000 [IU] | SUBCUTANEOUS | Status: DC

## 2019-09-19 MED ORDER — SODIUM CHLORIDE 0.9 % IV SOLN: INTRAVENOUS | Status: DC

## 2019-09-19 MED ORDER — METOPROLOL TARTRATE 50 MG PO TABS
50.0000 mg | ORAL_TABLET | Freq: Two times a day (BID) | ORAL | Status: DC
  Administered 2019-09-19 – 2019-09-21 (×4): 50 mg via ORAL
  Filled 2019-09-19 (×4): qty 1

## 2019-09-19 MED ORDER — CLOPIDOGREL BISULFATE 75 MG PO TABS
75.0000 mg | ORAL_TABLET | Freq: Every day | ORAL | Status: DC
  Administered 2019-09-20 – 2019-09-21 (×2): 75 mg via ORAL
  Filled 2019-09-19 (×2): qty 1

## 2019-09-19 MED ORDER — ENOXAPARIN SODIUM 40 MG/0.4ML ~~LOC~~ SOLN
40.0000 mg | SUBCUTANEOUS | Status: DC
  Administered 2019-09-19 – 2019-09-20 (×2): 40 mg via SUBCUTANEOUS
  Filled 2019-09-19 (×2): qty 0.4

## 2019-09-19 MED ORDER — MONTELUKAST SODIUM 10 MG PO TABS
10.0000 mg | ORAL_TABLET | Freq: Every day | ORAL | Status: DC
  Administered 2019-09-19: 10 mg via ORAL
  Filled 2019-09-19 (×2): qty 1

## 2019-09-19 MED ORDER — ACETAMINOPHEN 650 MG RE SUPP: 650.0000 mg | Freq: Four times a day (QID) | RECTAL | Status: DC | PRN

## 2019-09-19 MED ORDER — ATORVASTATIN CALCIUM 80 MG PO TABS
80.0000 mg | ORAL_TABLET | Freq: Every day | ORAL | Status: DC
  Administered 2019-09-20: 80 mg via ORAL
  Filled 2019-09-19: qty 1

## 2019-09-19 MED ORDER — INSULIN ASPART 100 UNIT/ML ~~LOC~~ SOLN
0.0000 [IU] | Freq: Three times a day (TID) | SUBCUTANEOUS | Status: DC
  Administered 2019-09-20: 11 [IU] via SUBCUTANEOUS
  Administered 2019-09-20: 7 [IU] via SUBCUTANEOUS
  Administered 2019-09-20: 20 [IU] via SUBCUTANEOUS
  Administered 2019-09-21: 15 [IU] via SUBCUTANEOUS

## 2019-09-19 MED ORDER — SODIUM CHLORIDE 0.9% FLUSH
3.0000 mL | Freq: Once | INTRAVENOUS | Status: AC
  Administered 2019-09-19: 14:00:00 3 mL via INTRAVENOUS

## 2019-09-19 MED ORDER — SODIUM CHLORIDE 0.9 % IV SOLN: INTRAVENOUS | Status: AC

## 2019-09-19 MED ORDER — QUETIAPINE FUMARATE 200 MG PO TABS
200.0000 mg | ORAL_TABLET | ORAL | Status: DC
  Administered 2019-09-20 – 2019-09-21 (×2): 200 mg via ORAL
  Filled 2019-09-19: qty 1
  Filled 2019-09-19: qty 2
  Filled 2019-09-19: qty 1
  Filled 2019-09-19: qty 2

## 2019-09-19 MED ORDER — GABAPENTIN 300 MG PO CAPS
300.0000 mg | ORAL_CAPSULE | Freq: Three times a day (TID) | ORAL | Status: DC
  Administered 2019-09-19 – 2019-09-21 (×6): 300 mg via ORAL
  Filled 2019-09-19: qty 1
  Filled 2019-09-19 (×2): qty 3
  Filled 2019-09-19: qty 1
  Filled 2019-09-19: qty 3
  Filled 2019-09-19: qty 1

## 2019-09-19 MED ORDER — SODIUM CHLORIDE 0.9 % IV SOLN
1.0000 g | Freq: Once | INTRAVENOUS | Status: AC
  Administered 2019-09-19: 14:00:00 1 g via INTRAVENOUS
  Filled 2019-09-19: qty 10

## 2019-09-19 MED ORDER — NITROGLYCERIN 0.4 MG SL SUBL: 0.4000 mg | SUBLINGUAL_TABLET | SUBLINGUAL | Status: DC | PRN

## 2019-09-19 MED ORDER — PANTOPRAZOLE SODIUM 40 MG PO TBEC
40.0000 mg | DELAYED_RELEASE_TABLET | Freq: Two times a day (BID) | ORAL | Status: DC
  Administered 2019-09-19 – 2019-09-21 (×4): 40 mg via ORAL
  Filled 2019-09-19 (×4): qty 1

## 2019-09-19 MED ORDER — ACETAMINOPHEN 500 MG PO TABS
1000.0000 mg | ORAL_TABLET | Freq: Once | ORAL | Status: AC
  Administered 2019-09-19: 11:00:00 1000 mg via ORAL
  Filled 2019-09-19: qty 2

## 2019-09-19 MED ORDER — DEXTROSE 50 % IV SOLN: 0.0000 mL | INTRAVENOUS | Status: DC | PRN

## 2019-09-19 MED ORDER — QUETIAPINE FUMARATE 400 MG PO TABS
400.0000 mg | ORAL_TABLET | Freq: Every day | ORAL | Status: DC
  Administered 2019-09-20: 400 mg via ORAL
  Filled 2019-09-19 (×2): qty 1
  Filled 2019-09-19 (×2): qty 4
  Filled 2019-09-19: qty 1

## 2019-09-19 MED ORDER — DICLOFENAC SODIUM 1 % EX GEL
2.0000 g | Freq: Three times a day (TID) | CUTANEOUS | Status: DC | PRN
  Administered 2019-09-20: 2 g via TOPICAL
  Filled 2019-09-19 (×2): qty 100

## 2019-09-19 MED ORDER — FENTANYL CITRATE (PF) 100 MCG/2ML IJ SOLN
50.0000 ug | Freq: Once | INTRAMUSCULAR | Status: AC
  Administered 2019-09-19: 50 ug via INTRAVENOUS
  Filled 2019-09-19: qty 2

## 2019-09-19 MED ORDER — ALBUTEROL SULFATE (2.5 MG/3ML) 0.083% IN NEBU: 2.5000 mg | INHALATION_SOLUTION | Freq: Four times a day (QID) | RESPIRATORY_TRACT | Status: DC | PRN

## 2019-09-19 MED ORDER — INSULIN REGULAR(HUMAN) IN NACL 100-0.9 UT/100ML-% IV SOLN
INTRAVENOUS | Status: DC
  Administered 2019-09-19: 16:00:00 12 [IU]/h via INTRAVENOUS
  Filled 2019-09-19 (×2): qty 100

## 2019-09-19 MED ORDER — SODIUM CHLORIDE 0.9 % IV SOLN
500.0000 mg | Freq: Once | INTRAVENOUS | Status: AC
  Administered 2019-09-19: 14:00:00 500 mg via INTRAVENOUS
  Filled 2019-09-19: qty 500

## 2019-09-19 MED ORDER — SODIUM CHLORIDE 0.9 % IV BOLUS
1000.0000 mL | Freq: Once | INTRAVENOUS | Status: AC
  Administered 2019-09-19: 13:00:00 1000 mL via INTRAVENOUS

## 2019-09-19 NOTE — ED Provider Notes (Signed)
[facility] EMERGENCY DEPARTMENT Provider Note   CSN: [id number] Arrival date & time: 09/19/19  2831     History Chief Complaint  Patient presents with  . Chest Pain    Maria Harper is a 49 y.o. female.  Patient reports past medical history diabetes, hypertension, hyperlipidemia, heart failure.  For the last couple days, has been having chest pain, worse on right side, right flank pain.  Also been having some shortness of breath.  No cough, no fever at home but has had chills.  Pain is relatively constant, sharp, stabbing, nonradiating.  No burning with urination, no vomiting.  HPI     Past Medical History:  Diagnosis Date  . Acid reflux   . Diabetes mellitus without complication (HCC)   . Heart attack (HCC) 04/2016  . High cholesterol   . Hypertension     Patient Active Problem List   Diagnosis Date Noted  . Cocaine abuse with cocaine-induced mood disorder (HCC) 06/10/2017    No past surgical history on file.   OB History   No obstetric history on file.     No family history on file.  Social History   Tobacco Use  . Smoking status: Current Every Day Smoker    Packs/day: 1.50    Years: 20.00    Pack years: 30.00    Types: Cigarettes  . Smokeless tobacco: Never Used  Substance Use Topics  . Alcohol use: Not Currently  . Drug use: Not Currently    Comment: last used yesterday. States " I do it everyday"     Home Medications Prior to Admission medications   Medication Sig Start Date End Date Taking? Authorizing Provider  albuterol (VENTOLIN HFA) 108 (90 Base) MCG/ACT inhaler Inhale 2 puffs into the lungs every 6 (six) hours as needed for wheezing or shortness of breath.   Yes [provider]  aspirin 81 MG chewable tablet Chew 81 mg by mouth daily.   Yes [provider]  atorvastatin (LIPITOR) 80 MG tablet Take 80 mg by mouth daily at 6 PM.  04/05/18  Yes [provider]  cloNIDine (CATAPRES) 0.1 MG tablet  Take 0.1 mg by mouth 2 (two) times daily. 12/20/17  Yes [provider]  clopidogrel (PLAVIX) 75 MG tablet Take 75 mg by mouth daily.    Yes [provider]  cyclobenzaprine (FLEXERIL) 10 MG tablet Take 10 mg by mouth 3 (three) times daily as needed for muscle spasms.    Yes [provider]  gabapentin (NEURONTIN) 300 MG capsule Take 300 mg by mouth 3 (three) times daily.   Yes [provider]  glipiZIDE (GLUCOTROL) 5 MG tablet Take 5 mg by mouth 2 (two) times daily before a meal.  12/20/17  Yes [provider]  HUMULIN R 100 UNIT/ML injection Inject 17 Units into the skin 2 (two) times daily.  06/13/18  Yes [provider]  ibuprofen (ADVIL) 800 MG tablet Take 800 mg by mouth every 8 (eight) hours as needed for moderate pain.   Yes [provider]  insulin NPH-regular Human (HUMULIN 70/30) (70-30) 100 UNIT/ML injection Inject 35 Units into the skin 2 (two) times daily.  07/01/17  Yes [provider]  metoprolol tartrate (LOPRESSOR) 50 MG tablet Take 50 mg by mouth 2 (two) times daily.   Yes [provider]  montelukast (SINGULAIR) 10 MG tablet Take 10 mg by mouth at bedtime.   Yes [provider]  nitroGLYCERIN (NITROSTAT) 0.4 MG SL  tablet Place 0.4 mg under the tongue as needed for chest pain.  04/05/18  Yes [provider]  pantoprazole (PROTONIX) 40 MG tablet Take 40 mg by mouth 2 (two) times daily.    Yes [provider]  polyethylene glycol powder (GLYCOLAX/MIRALAX) powder Take 17 g by mouth daily as needed for moderate constipation.  04/05/18  Yes [provider]  QUEtiapine (SEROQUEL) 200 MG tablet Take 200 mg by mouth every morning.  04/05/18  Yes [provider]  QUEtiapine (SEROQUEL) 400 MG tablet Take 400 mg by mouth at bedtime. 04/05/18  Yes [provider]    Allergies    Contrast media [iodinated diagnostic agents] and Tomato  Review of Systems   Review of  Systems  Constitutional: Positive for chills and fever.  HENT: Negative for ear pain and sore throat.   Eyes: Negative for pain and visual disturbance.  Respiratory: Positive for shortness of breath. Negative for cough.   Cardiovascular: Positive for chest pain. Negative for palpitations.  Gastrointestinal: Negative for abdominal pain and vomiting.  Genitourinary: Positive for flank pain. Negative for dysuria and hematuria.  Musculoskeletal: Negative for arthralgias and back pain.  Skin: Negative for color change and rash.  Neurological: Negative for seizures and syncope.  All other systems reviewed and are negative.   Physical Exam Updated Vital Signs BP 134/84   Pulse (!) 117   Temp 99.9 F (37.7 C) (Oral)   Resp 20   Ht 5\' 2"  (1.575 m)   Wt 81.6 kg   SpO2 98%   BMI 32.92 kg/m   Physical Exam Vitals and nursing note reviewed.  Constitutional:      General: She is not in acute distress.    Appearance: She is well-developed.  HENT:     Head: Normocephalic and atraumatic.  Eyes:     Conjunctiva/sclera: Conjunctivae normal.  Cardiovascular:     Rate and Rhythm: Normal rate and regular rhythm.     Heart sounds: No murmur.  Pulmonary:     Effort: Pulmonary effort is normal. No respiratory distress.     Breath sounds: Normal breath sounds.  Abdominal:     Palpations: Abdomen is soft.     Tenderness: There is no abdominal tenderness.     Comments: Positive right CVA tenderness, right flank tenderness  Musculoskeletal:     Cervical back: Neck supple.  Skin:    General: Skin is warm and dry.     Capillary Refill: Capillary refill takes less than 2 seconds.  Neurological:     General: No focal deficit present.     Mental Status: She is alert and oriented to person, place, and time.     ED Results / Procedures / Treatments   Labs (all labs ordered are listed, but only abnormal results are displayed) Labs Reviewed  BASIC METABOLIC PANEL - Abnormal; Notable for the  following components:      Result Value   Sodium 133 (*)    CO2 15 (*)    Glucose, Bld 377 (*)    Creatinine, Ser 1.34 (*)    Calcium 8.8 (*)    GFR calc non Af Amer 47 (*)    GFR calc Af Amer 55 (*)    Anion gap 17 (*)    All other components within normal limits  CBC - Abnormal; Notable for the following components:   WBC 22.6 (*)    RBC 3.27 (*)    Hemoglobin 9.2 (*)    HCT 29.1 (*)  RDW 18.3 (*)    All other components within normal limits  LACTIC ACID, PLASMA - Abnormal; Notable for the following components:   Lactic Acid, Venous 4.1 (*)    All other components within normal limits  LACTIC ACID, PLASMA - Abnormal; Notable for the following components:   Lactic Acid, Venous 2.8 (*)    All other components within normal limits  URINALYSIS, ROUTINE W REFLEX MICROSCOPIC - Abnormal; Notable for the following components:   Color, Urine AMBER (*)    APPearance CLOUDY (*)    Glucose, UA >=500 (*)    Hgb urine dipstick LARGE (*)    Ketones, ur 5 (*)    Protein, ur 100 (*)    Nitrite POSITIVE (*)    Leukocytes,Ua TRACE (*)    RBC / HPF >50 (*)    Bacteria, UA MANY (*)    All other components within normal limits  CBC WITH DIFFERENTIAL/PLATELET - Abnormal; Notable for the following components:   WBC 23.6 (*)    RBC 3.31 (*)    Hemoglobin 9.4 (*)    HCT 29.3 (*)    RDW 18.3 (*)    Neutro Abs 21.0 (*)    Monocytes Absolute 1.2 (*)    Abs Immature Granulocytes 0.18 (*)    All other components within normal limits  RESPIRATORY PANEL BY RT PCR (FLU A&B, COVID)  CULTURE, BLOOD (ROUTINE X 2)  CULTURE, BLOOD (ROUTINE X 2)  BRAIN NATRIURETIC PEPTIDE  D-DIMER, QUANTITATIVE (NOT AT [facility])  HEPATIC FUNCTION PANEL  CK  BETA-HYDROXYBUTYRIC ACID  I-STAT BETA HCG BLOOD, ED ([facility], [facility], [facility] ONLY)  POC SARS CORONAVIRUS 2 AG -  ED  POC SARS CORONAVIRUS 2 AG -  ED  TROPONIN I (HIGH SENSITIVITY)  TROPONIN I (HIGH SENSITIVITY)    EKG EKG Interpretation  Date/Time:  Wednesday September 19 2019 10:01:53 EST Ventricular Rate:  135 PR Interval:    QRS Duration: 79 QT Interval:  317 QTC Calculation: 476 R Axis:   -24 Text Interpretation: Age not entered, assumed to be  49 years old for purpose of ECG interpretation Sinus tachycardia Probable left atrial enlargement Borderline left axis deviation Consider anterior infarct Confirmed by Madalyn Rob [phone]) on 09/19/2019 10:15:29 AM   Radiology CT ABDOMEN PELVIS WO CONTRAST  Result Date: 09/19/2019 CLINICAL DATA:  Shortness of breath, chest pain, tachycardia and right flank pain. Elevated white blood cell count. EXAM: CT CHEST, ABDOMEN AND PELVIS WITHOUT CONTRAST TECHNIQUE: Multidetector CT imaging of the chest, abdomen and pelvis was performed following the standard protocol without IV contrast. COMPARISON:  Single-view of the chest earlier today. FINDINGS: CT CHEST FINDINGS Cardiovascular: No significant vascular findings. Normal heart size. No pericardial effusion. Mediastinum/Nodes: No enlarged mediastinal, hilar, or axillary lymph nodes. Thyroid gland, trachea, and esophagus demonstrate no significant findings. Lungs/Pleura: Dependent airspace disease in the lower lobes bilaterally is worse on the right and could be due to atelectasis or pneumonia. Atelectatic change is seen in the lingula and right middle lobe. Scattered, mild ground-glass attenuation is noted. Musculoskeletal: No acute or focal abnormality. CT ABDOMEN PELVIS FINDINGS Hepatobiliary: No focal liver abnormality is seen. No gallstones, gallbladder wall thickening, or biliary dilatation. Fatty infiltration of liver is noted. Pancreas: Unremarkable. No pancreatic ductal dilatation or surrounding inflammatory changes. Spleen: Normal in size without focal abnormality. Adrenals/Urinary Tract: The adrenal glands appear normal. Vascular calcifications are present in both kidneys. No stone or focal renal lesion is identified. Ureters and urinary bladder appear normal.  Stomach/Bowel: Stomach  is within normal limits. Appendix appears normal. No evidence of bowel wall thickening, distention, or inflammatory changes. Vascular/Lymphatic: Scattered atherosclerosis.  No lymphadenopathy. Reproductive: Uterus and bilateral adnexa are unremarkable. Other: Small fat containing umbilical hernia. Musculoskeletal: Negative. IMPRESSION: Dependent airspace disease bilaterally is worse on the right and could be due to atelectasis but may also represent pneumonia, particularly in the right lower lobe. No acute abnormality abdomen or pelvis. Negative for urinary tract stone. Atherosclerosis. Fatty infiltration of the liver. Small fat containing umbilical hernia. Electronically Signed   By: Drusilla Kanner M.D.   On: 09/19/2019 13:24   CT Chest Wo Contrast  Result Date: 09/19/2019 CLINICAL DATA:  Shortness of breath, chest pain, tachycardia and right flank pain. Elevated white blood cell count. EXAM: CT CHEST, ABDOMEN AND PELVIS WITHOUT CONTRAST TECHNIQUE: Multidetector CT imaging of the chest, abdomen and pelvis was performed following the standard protocol without IV contrast. COMPARISON:  Single-view of the chest earlier today. FINDINGS: CT CHEST FINDINGS Cardiovascular: No significant vascular findings. Normal heart size. No pericardial effusion. Mediastinum/Nodes: No enlarged mediastinal, hilar, or axillary lymph nodes. Thyroid gland, trachea, and esophagus demonstrate no significant findings. Lungs/Pleura: Dependent airspace disease in the lower lobes bilaterally is worse on the right and could be due to atelectasis or pneumonia. Atelectatic change is seen in the lingula and right middle lobe. Scattered, mild ground-glass attenuation is noted. Musculoskeletal: No acute or focal abnormality. CT ABDOMEN PELVIS FINDINGS Hepatobiliary: No focal liver abnormality is seen. No gallstones, gallbladder wall thickening, or biliary dilatation. Fatty infiltration of liver is noted. Pancreas:  Unremarkable. No pancreatic ductal dilatation or surrounding inflammatory changes. Spleen: Normal in size without focal abnormality. Adrenals/Urinary Tract: The adrenal glands appear normal. Vascular calcifications are present in both kidneys. No stone or focal renal lesion is identified. Ureters and urinary bladder appear normal. Stomach/Bowel: Stomach is within normal limits. Appendix appears normal. No evidence of bowel wall thickening, distention, or inflammatory changes. Vascular/Lymphatic: Scattered atherosclerosis.  No lymphadenopathy. Reproductive: Uterus and bilateral adnexa are unremarkable. Other: Small fat containing umbilical hernia. Musculoskeletal: Negative. IMPRESSION: Dependent airspace disease bilaterally is worse on the right and could be due to atelectasis but may also represent pneumonia, particularly in the right lower lobe. No acute abnormality abdomen or pelvis. Negative for urinary tract stone. Atherosclerosis. Fatty infiltration of the liver. Small fat containing umbilical hernia. Electronically Signed   By: Drusilla Kanner M.D.   On: 09/19/2019 13:24   DG Chest Portable 1 View  Result Date: 09/19/2019 CLINICAL DATA:  Febrile. Tachycardia. EXAM: PORTABLE CHEST 1 VIEW COMPARISON:  02/17/2020 FINDINGS: Heart size is normal. No pleural effusion or edema. No airspace opacities identified. Platelike atelectasis in both lung bases. IMPRESSION: Bibasilar atelectasis. Electronically Signed   By: Signa Kell M.D.   On: 09/19/2019 10:43    Procedures Procedures (including critical care time)  Medications Ordered in ED Medications  cefTRIAXone (ROCEPHIN) 1 g in sodium chloride 0.9 % 100 mL IVPB (1 g Intravenous New Bag/Given 09/19/19 1401)  azithromycin (ZITHROMAX) 500 mg in sodium chloride 0.9 % 250 mL IVPB (500 mg Intravenous New Bag/Given 09/19/19 1402)  sodium chloride flush (NS) 0.9 % injection 3 mL (3 mLs Intravenous Given 09/19/19 1402)  acetaminophen (TYLENOL) tablet 1,000 mg  (1,000 mg Oral Given 09/19/19 1109)  fentaNYL (SUBLIMAZE) injection 50 mcg (50 mcg Intravenous Given 09/19/19 1109)  sodium chloride 0.9 % bolus 1,000 mL (1,000 mLs Intravenous New Bag/Given 09/19/19 1244)  sodium chloride 0.9 % bolus 1,000 mL (1,000 mLs Intravenous New  Bag/Given 09/19/19 1402)  fentaNYL (SUBLIMAZE) injection 50 mcg (50 mcg Intravenous Given 09/19/19 1401)    ED Course  I have reviewed the triage vital signs and the nursing notes.  Pertinent labs & imaging results that were available during my care of the patient were reviewed by me and considered in my medical decision making (see chart for details).  Clinical Course as of Sep 18 1417  Wed Sep 19, 2019  1408 D/w internal medicine residents who will see and admit patient   [RD]    Clinical Course User Index [RD] Milagros Loll, MD   MDM Rules/Calculators/A&P                      49 year old lady presenting to ER with chief complaint chest pain, also having some right flank pain.  On exam patient noted to be well-appearing but tachycardic, borderline febrile.  Work-up concerning for profound leukocytosis, elevated lactate.  Given low-grade temperature, concern for infectious process.  UA concerning for UTI.  CT abdomen pelvis negative for stone or other acute process.  CT chest with likely pneumonia.  Started on antibiotics to cover for pneumonia as well as UTI.  Ceftriaxone azithromycin.  Initially cautious on fluids given reported history of heart failure however do not see any documented evidence of this on her chart, appears dry, gave total 2 L fluid bolus.  Given elevated lactic, leukocytosis, persistent tachycardia, will admit to medicine for further observation and management.  Discussed with internal medicine resident team who will evaluate and admit patient.   Final Clinical Impression(s) / ED Diagnoses Final diagnoses:  Urinary tract infection with hematuria, site unspecified  Community acquired pneumonia,  unspecified laterality  Leukocytosis, unspecified type    Rx / DC Orders ED Discharge Orders    None       Milagros Loll, MD 09/19/19 1419

## 2019-09-19 NOTE — H&P (Addendum)
Date: 09/19/2019               Patient Name:  Maria Harper MRN: [id number]  DOB: 01-15-1971 Age / Sex: 49 y.o., female   PCP: Synthia Innocent Audrea Muscat, NP         Medical Service: Internal Medicine Teaching Service         Attending Physician: Dr. Aldine Contes, MD    First Contact: Dr. Court Joy Pager: [phone]  Second Contact: Dr. Sherry Ruffing Pager: [phone]       After Hours (After 5p/  First Contact Pager: [phone]  weekends / holidays): Second Contact Pager: [phone]   Chief Complaint: Chest pain  History of Present Illness: Maria Harper is a 49 year old person with past medical history of GERD, HTN, HLD, Type 2 DM, MDD, and heart failure ( does not know prior results) who presents for evaluation of chest pain. Patient reports she started having chest pain at rest 2 days ago, she took some sublingual nitroglycerin, and tylenol which improved her chest pain.  States she had another episode of chest pain yesterday at rest and it improved. The chest pain was still bothering her more than the day before and when she woke up this morning the chest pain was worse.. She felt like an elephant was sitting on her chest and she decided to come to emergency department. No radiation of the pain at this time. She reports that she is not doing a lot of activity, so she is unsure if her chest pain worsened with activity. In addition she is also having some right arm pain and back pain that has been going on for about 1 year. She also reports difficulty with her breathing. She said she has been told she has asthma and also air pockets in her lungs. She thinks she may have COPD. She does not know if she had stents in her heart, but  had a heart attack about 2 years ago. She reports that this occurred in [place]. She says she has had chest pain sometimes since then, but nothing severe enough to make her go to the doctor. She has a history of asthma. Reports she has been having some blurry vision for the past  few days.  Currently menstruating. She reports that she has been having a cough recently, started some antibiotics which helped. Denies any sinus issues, fevers, chills, or rhinorrhea.     Meds:  Current Meds  Medication Sig  . albuterol (VENTOLIN HFA) 108 (90 Base) MCG/ACT inhaler Inhale 2 puffs into the lungs every 6 (six) hours as needed for wheezing or shortness of breath.  Marland Kitchen aspirin 81 MG chewable tablet Chew 81 mg by mouth daily.  Marland Kitchen atorvastatin (LIPITOR) 80 MG tablet Take 80 mg by mouth daily at 6 PM.   . cloNIDine (CATAPRES) 0.1 MG tablet Take 0.1 mg by mouth 2 (two) times daily.  . clopidogrel (PLAVIX) 75 MG tablet Take 75 mg by mouth daily.   . cyclobenzaprine (FLEXERIL) 10 MG tablet Take 10 mg by mouth 3 (three) times daily as needed for muscle spasms.   Marland Kitchen gabapentin (NEURONTIN) 300 MG capsule Take 300 mg by mouth 3 (three) times daily.  Marland Kitchen glipiZIDE (GLUCOTROL) 5 MG tablet Take 5 mg by mouth 2 (two) times daily before a meal.   . HUMULIN R 100 UNIT/ML injection Inject 17 Units into the skin 2 (two) times daily.   Marland Kitchen ibuprofen (ADVIL) 800 MG tablet Take 800 mg by mouth every 8 (  eight) hours as needed for moderate pain.  Marland Kitchen insulin NPH-regular Human (HUMULIN 70/30) (70-30) 100 UNIT/ML injection Inject 35 Units into the skin 2 (two) times daily.   . metoprolol tartrate (LOPRESSOR) 50 MG tablet Take 50 mg by mouth 2 (two) times daily.  . montelukast (SINGULAIR) 10 MG tablet Take 10 mg by mouth at bedtime.  . nitroGLYCERIN (NITROSTAT) 0.4 MG SL tablet Place 0.4 mg under the tongue as needed for chest pain.   . pantoprazole (PROTONIX) 40 MG tablet Take 40 mg by mouth 2 (two) times daily.   . polyethylene glycol powder (GLYCOLAX/MIRALAX) powder Take 17 g by mouth daily as needed for moderate constipation.   . QUEtiapine (SEROQUEL) 200 MG tablet Take 200 mg by mouth every morning.   Marland Kitchen QUEtiapine (SEROQUEL) 400 MG tablet Take 400 mg by mouth at bedtime.     Allergies: Allergies as of  09/19/2019 - Review Complete 09/19/2019  Allergen Reaction Noted  . Contrast media [iodinated diagnostic agents] Hives and Itching 06/09/2017  . Tomato Itching and Rash 01/18/2018   Past Medical History:  Diagnosis Date  . Acid reflux   . Diabetes mellitus without complication (HCC)   . Heart attack (HCC) 04/2016  . High cholesterol   . Hypertension     Family History: Diabetes in mother and father  Social History: Lives with her husband, no children. Moved here from [place]. Smoked 1/2 pack of cigarettes a day x 30 years = 15 ppd, no alcohol use, no recreational drug use  Review of Systems: A complete ROS was negative except as per HPI.   Physical Exam: Blood pressure 134/84, pulse (!) 117, temperature 99.9 F (37.7 C), temperature source Oral, resp. rate 20, height 5\' 2"  (1.575 m), weight 81.6 kg, SpO2 98 %.  General: Alert, nl appearance, NAD HE: Normocephalic, atraumatic. Scar on left side of face, PEERL, EOMI, Conjunctivae normal ENT: No congestion, no rhinorrhea, Broken molar on lower right: moist, no exudate or erythema  Cardiovascular: Tachycardic, regular rhythm.  No murmurs, rubs, or gallops Pulmonary : Effort normal, breath sounds normal. No wheezes, bilateral rales,no rhonchi Abdominal: soft, nontender, no mass,  distention, no guarding, no rebound,  bowel sounds normal, no cva tenderness Musculoskeletal: no swelling , deformity, injury ,or tenderness in extremities, Skin: Warm, dry , no bruising, erythema, or rash Neurological: alert and oriented x4 , no weakness, no sensory deficit  Psychiatric/Behavioral:  normal mood, normal behavior   EKG: personally reviewed my interpretation is sinus tachycardia .   CXR: personally reviewed my interpretation is no focal opacities, no pleural effusions , no pneumothorax.  Bilateral atelectasis  Assessment & Plan by Problem: Active Problems:   Sepsis (HCC)  Maria Harper is a 49 year old person with past medical history  of GERD, HTN, HLD, Type 2 DM, MDD , and heart failure who presents for evaluation of chest pain.  #Sepsis Patient presents tachycardic, borderline febrile, leukocytosis, and elevated lactate. Likely urinary or pulmonary source. UA positive for nitrites, trace leukocytes, many bacteria . Also noted glucosuria. Hematuria, patient currently menstruating. Reports some right flank pain this week, but mainly asymptomatic. CT chest show dependent airspace disease in both lower lobes , and possible pneumonia in right lower lobe. Patient started on Ceftriaxone and Azithromycin for CAP. Given 2 L in ED. Appears euvolemic to hypovolemic on our exam,  still tachycardic. Will give fluids with Insulin as below.  P:  - Continue Ceftriaxone and Azithromycin - f/u Bcx - trend fever and CBC    #  Hyperglycemia: Glucose 377 on admission, beta hydroxybutyric acid normal, ph normal on I-stat. Home insulin regimen Humulin 70/30 35 units BID P: - Insulin drip per endo tool   #AKI  Meeting criteria of >1.5x presumed baseline of ~.9. Likely prerenal in setting of sepsis and likely hypovolemic. P: - Trend BMP - Monitor I/O's - IV fluids with endo tool as above  #MDD - continue Seroquel   #CAD # Reports history of heart failure  P: - continue statin , Asprin, plavix, metoprolol 50 mg BID  #HTN - Continue Clonidine .1mg  BID  #Asthma - Continue Montelukast, PRN albuterol   Dispo: Admit patient to Inpatient with expected length of stay greater than 2 midnights.  Signed:  , MD PGY1

## 2019-09-19 NOTE — ED Notes (Addendum)
Called lab to add BNP to current sample.  

## 2019-09-19 NOTE — Progress Notes (Signed)
Given orders to stop Insulin and D5(1/2)NS drips from [place]. Pt made aware and has a diet. Pt given education to let staff know if feels like CBG is decreasing.

## 2019-09-19 NOTE — ED Triage Notes (Signed)
Came in via ems from home; c/o SOB, chest pain and tachycardia. Pt endorsed hx of DM and CHF

## 2019-09-20 DIAGNOSIS — E119 Type 2 diabetes mellitus without complications: Secondary | ICD-10-CM

## 2019-09-20 LAB — BASIC METABOLIC PANEL
Anion gap: 9 (ref 5–15)
Anion gap: 10 (ref 5–15)
Anion gap: 9 (ref 5–15)
Anion gap: 10 (ref 5–15)
BUN: 11 mg/dL (ref 6–20)
BUN: 9 mg/dL (ref 6–20)
BUN: 9 mg/dL (ref 6–20)
BUN: 10 mg/dL (ref 6–20)
CO2: 19 mmol/L — ABNORMAL LOW (ref 22–32)
CO2: 18 mmol/L — ABNORMAL LOW (ref 22–32)
CO2: 19 mmol/L — ABNORMAL LOW (ref 22–32)
CO2: 18 mmol/L — ABNORMAL LOW (ref 22–32)
Calcium: 8.4 mg/dL — ABNORMAL LOW (ref 8.9–10.3)
Calcium: 8.2 mg/dL — ABNORMAL LOW (ref 8.9–10.3)
Calcium: 8.3 mg/dL — ABNORMAL LOW (ref 8.9–10.3)
Calcium: 8.4 mg/dL — ABNORMAL LOW (ref 8.9–10.3)
Chloride: 107 mmol/L (ref 98–111)
Chloride: 105 mmol/L (ref 98–111)
Chloride: 110 mmol/L (ref 98–111)
Chloride: 109 mmol/L (ref 98–111)
Creatinine, Ser: 1.02 mg/dL — ABNORMAL HIGH (ref 0.44–1.00)
Creatinine, Ser: 1.21 mg/dL — ABNORMAL HIGH (ref 0.44–1.00)
Creatinine, Ser: 1.31 mg/dL — ABNORMAL HIGH (ref 0.44–1.00)
Creatinine, Ser: 1.26 mg/dL — ABNORMAL HIGH (ref 0.44–1.00)
GFR calc Af Amer: >60 mL/min (ref >60)
GFR calc Af Amer: >60 mL/min (ref >60)
GFR calc Af Amer: 56 mL/min — ABNORMAL LOW (ref >60)
GFR calc Af Amer: 59 mL/min — ABNORMAL LOW (ref >60)
GFR calc non Af Amer: >60 mL/min (ref >60)
GFR calc non Af Amer: 54 mL/min — ABNORMAL LOW (ref >60)
GFR calc non Af Amer: 49 mL/min — ABNORMAL LOW (ref >60)
GFR calc non Af Amer: 51 mL/min — ABNORMAL LOW (ref >60)
Glucose, Bld: 304 mg/dL — ABNORMAL HIGH (ref 70–99)
Glucose, Bld: 355 mg/dL — ABNORMAL HIGH (ref 70–99)
Glucose, Bld: 241 mg/dL — ABNORMAL HIGH (ref 70–99)
Glucose, Bld: 174 mg/dL — ABNORMAL HIGH (ref 70–99)
Potassium: 3.9 mmol/L (ref 3.5–5.1)
Potassium: 4.2 mmol/L (ref 3.5–5.1)
Potassium: 3.6 mmol/L (ref 3.5–5.1)
Potassium: 4 mmol/L (ref 3.5–5.1)
Sodium: 135 mmol/L (ref 135–145)
Sodium: 133 mmol/L — ABNORMAL LOW (ref 135–145)
Sodium: 138 mmol/L (ref 135–145)
Sodium: 137 mmol/L (ref 135–145)

## 2019-09-20 LAB — GLUCOSE, CAPILLARY
Glucose-Capillary: 288 mg/dL — ABNORMAL HIGH (ref 70–99)
Glucose-Capillary: 294 mg/dL — ABNORMAL HIGH (ref 70–99)
Glucose-Capillary: 406 mg/dL — ABNORMAL HIGH (ref 70–99)
Glucose-Capillary: 205 mg/dL — ABNORMAL HIGH (ref 70–99)
Glucose-Capillary: 102 mg/dL — ABNORMAL HIGH (ref 70–99)

## 2019-09-20 LAB — CBC
HCT: 27.7 % — ABNORMAL LOW (ref 36.0–46.0)
Hemoglobin: 8.8 g/dL — ABNORMAL LOW (ref 12.0–15.0)
MCH: 27.8 pg (ref 26.0–34.0)
MCHC: 31.8 g/dL (ref 30.0–36.0)
MCV: 87.7 fL (ref 80.0–100.0)
Platelets: 201 10*3/uL (ref 150–400)
RBC: 3.16 MIL/uL — ABNORMAL LOW (ref 3.87–5.11)
RDW: 18 % — ABNORMAL HIGH (ref 11.5–15.5)
WBC: 13.1 10*3/uL — ABNORMAL HIGH (ref 4.0–10.5)
nRBC: 0 % (ref 0.0–0.2)

## 2019-09-20 LAB — BETA-HYDROXYBUTYRIC ACID: Beta-Hydroxybutyric Acid: 0.18 mmol/L (ref 0.05–0.27)

## 2019-09-20 LAB — LACTIC ACID, PLASMA
Lactic Acid, Venous: 2.1 mmol/L (ref 0.5–1.9)
Lactic Acid, Venous: 1.9 mmol/L (ref 0.5–1.9)

## 2019-09-20 MED ORDER — INSULIN ASPART PROT & ASPART (70-30 MIX) 100 UNIT/ML ~~LOC~~ SUSP
45.0000 [IU] | Freq: Two times a day (BID) | SUBCUTANEOUS | Status: DC
  Administered 2019-09-20 (×2): 45 [IU] via SUBCUTANEOUS
  Filled 2019-09-20 (×2): qty 10

## 2019-09-20 MED ORDER — SODIUM CHLORIDE 0.9 % IV SOLN
1.0000 g | INTRAVENOUS | Status: DC
  Administered 2019-09-20: 1 g via INTRAVENOUS
  Filled 2019-09-20 (×2): qty 10

## 2019-09-20 MED ORDER — SODIUM CHLORIDE 0.9 % IV SOLN
500.0000 mg | INTRAVENOUS | Status: DC
  Administered 2019-09-20: 500 mg via INTRAVENOUS
  Filled 2019-09-20 (×2): qty 500

## 2019-09-20 MED ORDER — SODIUM CHLORIDE 0.9 % IV SOLN: 1.0000 g | INTRAVENOUS | Status: DC

## 2019-09-20 MED ORDER — SODIUM CHLORIDE 0.9 % IV SOLN: 500.0000 mg | INTRAVENOUS | Status: DC

## 2019-09-20 MED ORDER — LACTATED RINGERS IV SOLN: INTRAVENOUS | Status: AC

## 2019-09-20 MED ORDER — INSULIN NPH (HUMAN) (ISOPHANE) 100 UNIT/ML ~~LOC~~ SUSP: 45.0000 [IU] | Freq: Two times a day (BID) | SUBCUTANEOUS | Status: DC

## 2019-09-20 NOTE — Progress Notes (Signed)
   Subjective: Patient reports that she is not having any chest pain today. Her chest pain was worse with breathing, but since starting antibiotic she feels it has gotten better. She denies any new complaints today. She was wondering why she was getting recurrent UTIs.  We discussed it is important to control her blood glucose and women are at more risk for UTI secondary to their anatomy. All questions and concerns addressed.  Objective:  Vital signs in last 24 hours: Vitals:   09/19/19 1852 09/19/19 2050 09/20/19 0008 09/20/19 0538  BP: (!) 143/93 139/88 134/76 (!) 124/101  Pulse:  (!) 102  88  Resp:  20 (!) 21 20  Temp: 98.6 F (37 C)  98.6 F (37 C) 98.1 F (36.7 C)  TempSrc:  Oral Oral Oral  SpO2: 99%  97% 93%  Weight:      Height:       Neuro: Alert, no acute distress CV: Regular rate and rhythm no murmurs rubs or gallops Pulmonary: Normal effort, breath sounds are normal, no wheezes, no rales, no rhonchi Abdomen: Nontender, bowel sounds present Musculoskeletal: No lower extremity edema, no deformities  Assessment/Plan:  Active Problems:   Sepsis (HCC)  Eddy Holquin is a 49 year old person with a past medical history of GERD, hypertension, hyperlipidemia, type 2 diabetes, MDD, and heart failure presents for evaluation of chest pain. Patient diagnosed with sepsis 2/2 underlying UTI and pneumonia.   #Sepsis secondary to UTI and CAP Patient's chest pain has improved on exam this morning with antibiotics.  Likely patient was experiencing chest pain from pneumonia.  Patient's flank pain has on the right side is also improving.  Stop her IV fluids this morning and her creatinine started to trend up with CO2 trending down. Starting fluids and will trend labs.  P: -  trend lactic acid  - Lactated Ringer's at 125 mL/h  - Continue ceftriaxone and azithromycin , day 2 of antibiotics   - Trend CBC - Trend fever curve  #T2DM Home insulin regimen Humulin N 70/30 35 units BID.  Also Humin R 17 units BID. Blood glucose trending up this morning , will increase Insulin 70/30.  P: - Insulin 7030 , 45 units BID - SSI-Resistant   #AKI Baseline creatinine approximately 0.9.  Likely prerenal in setting of sepsis.  As mentioned above creatinine initially improving and started trending back up with stopping IV fluids PE: -Trend BMP -Monitor I's and O's -IV fluids as above  #MDD -Continue Seroquel  #CAD # CHF P: -Continue statin, aspirin, Plavix, metoprolol 50 mg twice daily,  #Hypertension -Continue clonidine .1 mg twice daily  #Asthma - Continue Montelukast, PRN albuterol  Prior to Admission Living Arrangement: home Anticipated Discharge Location:home Barriers to Discharge: continued medical treatment Dispo: Anticipated discharge in approximately 1-2 day(s).   Thurmon Fair, MD PGY1

## 2019-09-20 NOTE — Plan of Care (Signed)

## 2019-09-20 NOTE — Progress Notes (Signed)
Date: 09/20/2019  Patient name: Maria Harper  Medical record number: [id number]  Date of birth: 05-26-1971   I have seen and evaluated Clent Demark and discussed their care with the Residency Team.  In brief, patient is a 49 year old female with a past medical history of GERD, hypertension, hyperlipidemia, type 2 diabetes, CHF (unknown EF) who presented to the ED with intermittent chest pain x2 days.  Patient states that 2 days ago she developed new onset midsternal chest pain for which she took some sublingual nitroglycerin and Tylenol which improved her pain.  Yesterday morning, patient states that she woke up with worsening chest pain.  Stated that it felt like an elephant was sitting on her chest and came to the ED for further evaluation.  Patient states that chest pain is worsened with deep inspiration.  Chest pain was nonradiating, pressure-like.  Patient did report some associated shortness of breath as well as some blurry vision over the last couple of days.  Patient did complain of a nonproductive cough and was started on some antibiotics as an outpatient which helped.  No palpitations, no diaphoresis, no lightheadedness, no syncope, no fevers or chills, no focal weakness, no tingling or numbness, no headache, no blurry vision, no nausea or vomiting, no abdominal pain, no diarrhea.  Patient did complain of increased urinary frequency but no burning.  Today patient states that she feels much better and that her chest pain is improved and she has only mild pain with deep inspiration.  PMHx, Fam Hx, and/or Soc Hx : As per resident admit note  Vitals:   09/20/19 0538 09/20/19 0823  BP: (!) 124/101 136/86  Pulse: 88   Resp: 20   Temp: 98.1 F (36.7 C)   SpO2: 93%    General: Awake, alert, oriented x3, NAD CVS: Regular rhythm, normal heart sounds Lungs: CTA bilaterally Abdomen: Soft, nontender, nondistended, normoactive bowel sounds Extremities: No edema noted, nontender to  palpation Skin: Warm and dry Neuro: Oriented x3 Psych: Normal mood and affect  Assessment and Plan: I have seen and evaluated the patient as outlined above. I agree with the formulated Assessment and Plan as detailed in the residents' note, with the following changes:   1.  Sepsis secondary to underlying UTI and possible pneumonia: -Patient presented to the ED with chest pain x2 days which was pleuritic in nature as well as increased urinary frequency.  Patient did have a UA which showed many bacteria as well as nitrites and leukocytes.  She was also noted to have possible right lower lobe pneumonia on her CT.  In the ED she was tachycardic to the 120s with low-grade fevers (temp 99.9) as well as leukocytosis in the 20s consistent with sepsis. -We will continue ceftriaxone and azithromycin for now -Blood cultures no growth to date.  We will continue to monitor -We will follow-up urine culture as well -Leukocytosis mildly improved to 20 today.  We will continue to monitor CBC -Patient also was noted to have lactic acidosis up to 4.1 on initial admission which improved to 2.8 with fluids.  We will check repeat lactic acid today -Patient remains afebrile.  No further work-up at this time. -Patient tolerating oral intake currently.  IV fluids were DC'd today.  However, there is concern that her creatinine worsened slightly from 1.02-1.21 today and her bicarbonate decreased to 18.  We will follow closely and consider resuming IV fluids if required -Of note, patient was hyperglycemic to 300s on admission and is currently in the  400s.  We will continue with 70/30 insulin for now with sliding scale and increase based on her requirement. -Patient also noted to have AKI on admission with creatinine up to 1.42.  This is improved slightly to 1.21.  We will continue to monitor closely.  I suspect this is prerenal secondary to her underlying sepsis -No further work-up at this time   Earl Lagos,  MD 2/18/20211:27 PM

## 2019-09-20 NOTE — Care Management (Signed)
CM requested by bedside nurse to contact pt.  CM spoke with both pt and pts husband  Pt is independent from home with spouse.  Pt informed CM that she goes to [facility] for PCP and that she also has the orange card for prescriptions filled at the [facility].  If pt discharges over weekend -  [facility] will not be open and pt may need MATCH.

## 2019-09-21 ENCOUNTER — Other Ambulatory Visit: Payer: Self-pay

## 2019-09-21 ENCOUNTER — Encounter (HOSPITAL_COMMUNITY): Payer: Self-pay | Admitting: Internal Medicine

## 2019-09-21 DIAGNOSIS — Z794 Long term (current) use of insulin: Secondary | ICD-10-CM

## 2019-09-21 LAB — CBC
HCT: 27.4 % — ABNORMAL LOW (ref 36.0–46.0)
Hemoglobin: 8.8 g/dL — ABNORMAL LOW (ref 12.0–15.0)
MCH: 28 pg (ref 26.0–34.0)
MCHC: 32.1 g/dL (ref 30.0–36.0)
MCV: 87.3 fL (ref 80.0–100.0)
Platelets: 199 10*3/uL (ref 150–400)
RBC: 3.14 MIL/uL — ABNORMAL LOW (ref 3.87–5.11)
RDW: 18 % — ABNORMAL HIGH (ref 11.5–15.5)
WBC: 8.2 10*3/uL (ref 4.0–10.5)
nRBC: 0 % (ref 0.0–0.2)

## 2019-09-21 LAB — BASIC METABOLIC PANEL
Anion gap: 11 (ref 5–15)
BUN: 8 mg/dL (ref 6–20)
CO2: 19 mmol/L — ABNORMAL LOW (ref 22–32)
Calcium: 8.6 mg/dL — ABNORMAL LOW (ref 8.9–10.3)
Chloride: 103 mmol/L (ref 98–111)
Creatinine, Ser: 1.03 mg/dL — ABNORMAL HIGH (ref 0.44–1.00)
GFR calc Af Amer: >60 mL/min (ref >60)
GFR calc non Af Amer: >60 mL/min (ref >60)
Glucose, Bld: 288 mg/dL — ABNORMAL HIGH (ref 70–99)
Potassium: 3.9 mmol/L (ref 3.5–5.1)
Sodium: 133 mmol/L — ABNORMAL LOW (ref 135–145)

## 2019-09-21 LAB — GLUCOSE, CAPILLARY
Glucose-Capillary: 133 mg/dL — ABNORMAL HIGH (ref 70–99)
Glucose-Capillary: 308 mg/dL — ABNORMAL HIGH (ref 70–99)

## 2019-09-21 LAB — URINE CULTURE

## 2019-09-21 LAB — HEMOGLOBIN A1C
Hgb A1c MFr Bld: 13.7 % — ABNORMAL HIGH (ref 4.8–5.6)
Mean Plasma Glucose: 346.49 mg/dL

## 2019-09-21 MED ORDER — SODIUM CHLORIDE 0.9 % IV SOLN
500.0000 mg | INTRAVENOUS | Status: DC
  Administered 2019-09-21: 500 mg via INTRAVENOUS
  Filled 2019-09-21: qty 500

## 2019-09-21 MED ORDER — SODIUM CHLORIDE 0.9 % IV SOLN
1.0000 g | INTRAVENOUS | Status: DC
  Administered 2019-09-21: 1 g via INTRAVENOUS
  Filled 2019-09-21: qty 1

## 2019-09-21 MED ORDER — CEFDINIR 300 MG PO CAPS: 300.0000 mg | ORAL_CAPSULE | Freq: Two times a day (BID) | ORAL | 0 refills | Status: AC

## 2019-09-21 MED FILL — CEFDINIR 300 MG CAPSULE: 300 | 2 days supply | Qty: 4 | Fill #0

## 2019-09-21 NOTE — Progress Notes (Signed)
Patient's Cr is trending down. Encouraged good PO intake. WBC nl. Will discharge with oral antibiotics and have her follow up with PCP on Monday. Antibiotics sent to [facility], patient will be ready for DC after antibiotics are brought to her room.

## 2019-09-21 NOTE — Discharge Instructions (Addendum)
Ms.Patricelli,  It was our pleasure to provide care here in the hospital.  You came in and were found to have pneumonia and a urinary tract infection.  You were given antibiotics and have quickly improved.  Your diabetes is uncontrolled and uncontrolled diabetes mellitus can make you prone to recurrent infections.I recommend follow-up with your primary care doctor Monday as we discussed.   My best,  Thurmon Fair, MD

## 2019-09-21 NOTE — Progress Notes (Signed)
Pt refused blood work again this morning.

## 2019-09-21 NOTE — Plan of Care (Signed)

## 2019-09-21 NOTE — Progress Notes (Addendum)
   Subjective:  O/N: Patient refused morning labs.  Patient reports that she is feeling well today, no acute events overnight. She denies any chest pain, SOB, fevers, or chills. Reports that she is arranging a follow up appointment with her PCP on Monday when she can get labs. Discussed reason for labs and patient willing to get one more set of labs this morning. All questions and concerns adressed.   Objective:  Vital signs in last 24 hours: Vitals:   09/20/19 2330 09/20/19 2333 09/21/19 0410 09/21/19 0856  BP:  132/73 137/65 137/81  Pulse:    80  Resp: 19 18 15 20   Temp:    97.8 F (36.6 C)  TempSrc:    Oral  SpO2:    96%  Weight:      Height:       Neuro: Alert, no acute distress CV: Regular rate and rhythm no murmurs rubs or gallops Pulmonary: Poor effort, breath sounds are normal, no wheezes, no rales, no rhonchi Abdomen: Nontender, bowel sounds present Musculoskeletal: No lower extremity edema, no deformities  Assessment/Plan:  Active Problems:   Sepsis (HCC)  Maria Harper is a 49 year old person with a past medical history of GERD, hypertension, hyperlipidemia, type 2 diabetes, MDD, and heart failure presents for evaluation of chest pain. Patient diagnosed with sepsis 2/2 underlying UTI and pneumonia.   #Sepsis secondary to UTI and CAP Patient reports improved and almost completely resolved symptoms. She initially refused lab draws, but willing to let 51 follow up one more set of labs. If her Cr is improved and leukocytosis continues to trend down she will be ready for discharge.  P: - Continue ceftriaxone and azithromycin , day 3 of antibiotics. She can go home on 2 more days of Cefdinir, stop Azithromycin, and follow up with her PCP pending lab work is showing improvement.  - Trend CBC  #T2DM Home insulin regimen Humulin N 70/30 35 units BID. Also Humilin R 17 units BID. Blood glucose well controlled this morning , at inpatient goal.  P: - Insulin 7030 , 45  units BID - SSI-Resistant   #AKI Baseline creatinine approximately 0.9.  Likely prerenal in setting of sepsis.  As mentioned above will trend with labs and if Cr is improving patient can be discharged as she requested. PE: -Trend BMP  #MDD -Continue Seroquel  #CAD # CHF P: -Continue statin, aspirin, Plavix, metoprolol 50 mg twice daily,  #Hypertension -Continue clonidine .1 mg twice daily  #Asthma - Continue Montelukast, PRN albuterol  Prior to Admission Living Arrangement: home Anticipated Discharge Location:home Barriers to Discharge: none Dispo: Anticipated discharge today    [place], MD PGY1

## 2019-09-21 NOTE — Discharge Summary (Signed)
Name: Maria Harper MRN: [id number] DOB: 01-22-1971 49 y.o. PCP: Lavinia Sharps, NP  Date of Admission: 09/19/2019  9:55 AM Date of Discharge:   Attending Physician: Earl Lagos, MD  Discharge Diagnosis: 1. Sepsis  Discharge Medications: Allergies as of 09/21/2019       Reactions   Contrast Media [iodinated Diagnostic Agents] Hives, Itching   Tomato Itching, Rash        Medication List     TAKE these medications    aspirin 81 MG chewable tablet Chew 81 mg by mouth daily.   atorvastatin 80 MG tablet Commonly known as: LIPITOR Take 80 mg by mouth daily at 6 PM.   cefdinir 300 MG capsule Commonly known as: OMNICEF Take 1 capsule (300 mg total) by mouth 2 (two) times daily for 2 days.   cloNIDine 0.1 MG tablet Commonly known as: CATAPRES Take 0.1 mg by mouth 2 (two) times daily.   clopidogrel 75 MG tablet Commonly known as: PLAVIX Take 75 mg by mouth daily.   cyclobenzaprine 10 MG tablet Commonly known as: FLEXERIL Take 10 mg by mouth 3 (three) times daily as needed for muscle spasms.   gabapentin 300 MG capsule Commonly known as: NEURONTIN Take 300 mg by mouth 3 (three) times daily.   glipiZIDE 5 MG tablet Commonly known as: GLUCOTROL Take 5 mg by mouth 2 (two) times daily before a meal.   HumuLIN 70/30 (70-30) 100 UNIT/ML injection Generic drug: insulin NPH-regular Human Inject 35 Units into the skin 2 (two) times daily.   HumuLIN R 100 units/mL injection Generic drug: insulin regular Inject 17 Units into the skin 2 (two) times daily.   ibuprofen 800 MG tablet Commonly known as: ADVIL Take 800 mg by mouth every 8 (eight) hours as needed for moderate pain.   metoprolol tartrate 50 MG tablet Commonly known as: LOPRESSOR Take 50 mg by mouth 2 (two) times daily.   montelukast 10 MG tablet Commonly known as: SINGULAIR Take 10 mg by mouth at bedtime.   nitroGLYCERIN 0.4 MG SL tablet Commonly known as: NITROSTAT Place 0.4 mg under the  tongue as needed for chest pain.   pantoprazole 40 MG tablet Commonly known as: PROTONIX Take 40 mg by mouth 2 (two) times daily.   polyethylene glycol powder 17 GM/SCOOP powder Commonly known as: GLYCOLAX/MIRALAX Take 17 g by mouth daily as needed for moderate constipation.   QUEtiapine 200 MG tablet Commonly known as: SEROQUEL Take 200 mg by mouth every morning.   QUEtiapine 400 MG tablet Commonly known as: SEROQUEL Take 400 mg by mouth at bedtime.   Ventolin HFA 108 (90 Base) MCG/ACT inhaler Generic drug: albuterol Inhale 2 puffs into the lungs every 6 (six) hours as needed for wheezing or shortness of breath.        Disposition and follow-up:   Ms.Maria Harper was discharged from [facility] in Stable condition.  At the hospital follow up visit please address:  1.   Sepsis secondary to UTI and CAP        - Should have finished 2 days of Cefdinir after discharge        - Patient mentions repeated UTI's , I expect 2/2 uncontrolled T2DM         - Follow up resolution of UTI and CAP       T2DM      - Hemoglobin A1c 13.7, recommend discussing end organ complications of uncontrolled diabetes  and better adherence/control of blood glucose.         - Fatty infiltration of the liver noted on imaging    AKI      Cr trending down on admission. Patients Cr 1.03 at discharge.      - BMP  2.  Labs / imaging needed at time of follow-up: bmp  3.  Pending labs/ test needing follow-up:   Follow-up Appointments: Follow-up Information     Placey, Audrea Muscat, NP. Schedule an appointment as soon as possible for a visit in 1 week(s).   Contact information: [address] [place] [address] [phone]            Hospital Course by problem list: 1.  #Sepsis secondary to UTI and CAP Patient presents tachycardic, borderline febrile, leukocytosis, and with elevated lactic acid. Likely urinary or pulmonary source. UA positive for  nitrites, trace leukocytes, many bacteria . Also noted glucosuria. Hematuria, patient currently menstruating. Reported right flank pain and dysuria. CT chest show dependent airspace disease in both lower lobes , and possible pneumonia in right lower lobe. Patient started on Ceftriaxone and Azithromycin for CAP. Given IV fluids. She improved by day 2 and requested she be discharged. She initially refused lab draws, but willing to let us follow up one more set of labs. Her Cr was improving and leukocytosis continued to trend down so she was discharged with 2 more days of Cefdinir. She finished 3 day course of Azithromycin and 3 days of Ceftriaxone.     #T2DM Home insulin regimen Humulin N 70/30 35 units BID. Also Humilin R 17 units BID. Blood glucose contolled on day of discharge on 45 units of Insulin 70/30.  Patient will be discharged back on home regimen. Hemoglobin A1c 13.7, will mention to PCP so they can continue to work with patient on better control of T2DM.    #AKI Baseline creatinine approximately 0.9.  Likely prerenal in setting of sepsis.  Improved to 1.03 at discharge.   #MDD -Continue Seroquel  #CAD # CHF P: -Continue statin, aspirin, Plavix, metoprolol 50 mg twice daily,  #Hypertension -Continue clonidine .1 mg twice daily  #Asthma - Continue Montelukast, PRN albuterol  Discharge Vitals:   BP 137/81 (BP Location: Left Arm)    Pulse 80    Temp 97.8 F (36.6 C) (Oral)    Resp 20    Ht 5\' 2"  (1.575 m)    Wt 81.6 kg    SpO2 96%    BMI 32.92 kg/m   Pertinent Labs, Studies, and Procedures:  CMP Latest Ref Rng & Units 09/21/2019 09/20/2019 09/20/2019  Glucose 70 - 99 mg/dL 288(H) 174(H) 241(H)  BUN 6 - 20 mg/dL 8 10 9   Creatinine 0.44 - 1.00 mg/dL 1.03(H) 1.26(H) 1.31(H)  Sodium 135 - 145 mmol/L 133(L) 137 138  Potassium 3.5 - 5.1 mmol/L 3.9 4.0 3.6  Chloride 98 - 111 mmol/L 103 109 110  CO2 22 - 32 mmol/L 19(L) 18(L) 19(L)  Calcium 8.9 - 10.3 mg/dL 8.6(L) 8.4(L)  8.3(L)  Total Protein 6.5 - 8.1 g/dL - - -  Total Bilirubin 0.3 - 1.2 mg/dL - - -  Alkaline Phos 38 - 126 U/L - - -  AST 15 - 41 U/L - - -  ALT 0 - 44 U/L - - -   BMP Latest Ref Rng & Units 09/21/2019 09/20/2019 09/20/2019  Glucose 70 - 99 mg/dL 288(H) 174(H) 241(H)  BUN 6 - 20 mg/dL 8 10 9   Creatinine  0.44 - 1.00 mg/dL 5.00(X) 3.81(W) 2.99(B)  Sodium 135 - 145 mmol/L 133(L) 137 138  Potassium 3.5 - 5.1 mmol/L 3.9 4.0 3.6  Chloride 98 - 111 mmol/L 103 109 110  CO2 22 - 32 mmol/L 19(L) 18(L) 19(L)  Calcium 8.9 - 10.3 mg/dL 7.1(I) 9.6(V) 8.3(L)   CLINICAL DATA:  Shortness of breath, chest pain, tachycardia and right flank pain. Elevated white blood cell count.  EXAM: CT CHEST, ABDOMEN AND PELVIS WITHOUT CONTRAST  TECHNIQUE: Multidetector CT imaging of the chest, abdomen and pelvis was performed following the standard protocol without IV contrast.  COMPARISON:  Single-view of the chest earlier today.  FINDINGS: CT CHEST FINDINGS  Cardiovascular: No significant vascular findings. Normal heart size. No pericardial effusion.  Mediastinum/Nodes: No enlarged mediastinal, hilar, or axillary lymph nodes. Thyroid gland, trachea, and esophagus demonstrate no significant findings.  Lungs/Pleura: Dependent airspace disease in the lower lobes bilaterally is worse on the right and could be due to atelectasis or pneumonia. Atelectatic change is seen in the lingula and right middle lobe. Scattered, mild ground-glass attenuation is noted.  Musculoskeletal: No acute or focal abnormality.  CT ABDOMEN PELVIS FINDINGS  Hepatobiliary: No focal liver abnormality is seen. No gallstones, gallbladder wall thickening, or biliary dilatation. Fatty infiltration of liver is noted.  Pancreas: Unremarkable. No pancreatic ductal dilatation or surrounding inflammatory changes.  Spleen: Normal in size without focal abnormality.  Adrenals/Urinary Tract: The adrenal glands appear normal.  Vascular calcifications are present in both kidneys. No stone or focal renal lesion is identified. Ureters and urinary bladder appear normal.  Stomach/Bowel: Stomach is within normal limits. Appendix appears normal. No evidence of bowel wall thickening, distention, or inflammatory changes.  Vascular/Lymphatic: Scattered atherosclerosis.  No lymphadenopathy.  Reproductive: Uterus and bilateral adnexa are unremarkable.  Other: Small fat containing umbilical hernia.  Musculoskeletal: Negative.  IMPRESSION: Dependent airspace disease bilaterally is worse on the right and could be due to atelectasis but may also represent pneumonia, particularly in the right lower lobe.  No acute abnormality abdomen or pelvis. Negative for urinary tract stone.  Atherosclerosis.  Fatty infiltration of the liver.  Small fat containing umbilical hernia.   Discharge Instructions: Discharge Instructions     Call MD for:  difficulty breathing, headache or visual disturbances   Complete by: As directed    Call MD for:  extreme fatigue   Complete by: As directed    Call MD for:  hives   Complete by: As directed    Call MD for:  persistant dizziness or light-headedness   Complete by: As directed    Call MD for:  persistant nausea and vomiting   Complete by: As directed    Call MD for:  redness, tenderness, or signs of infection (pain, swelling, redness, odor or green/yellow discharge around incision site)   Complete by: As directed    Call MD for:  severe uncontrolled pain   Complete by: As directed    Call MD for:  temperature >100.4   Complete by: As directed    Diet - low sodium heart healthy   Complete by: As directed    Increase activity slowly   Complete by: As directed        Signed: Albertha Ghee, MD 09/21/2019, 12:39 PM   Pager: @[phone] @

## 2019-09-21 NOTE — Care Management (Signed)
Pt deemed stable for discharge home today .  TOC will cover $4 cost of discharge medication.  No other CM needs determined - CM signing off

## 2019-09-21 NOTE — Progress Notes (Signed)
Pt refused blood work. Stated earlier in shift that she is ready to go home.

## 2019-09-24 LAB — CULTURE, BLOOD (ROUTINE X 2)
Culture: NO GROWTH
Culture: NO GROWTH
Special Requests: ADEQUATE

## 2019-11-01 DIAGNOSIS — I255 Ischemic cardiomyopathy: Secondary | ICD-10-CM

## 2019-11-01 HISTORY — DX: Ischemic cardiomyopathy: I25.5

## 2019-11-23 ENCOUNTER — Encounter (HOSPITAL_COMMUNITY)
Admission: EM | Disposition: A | Discharge status: Home / Self Care | Payer: Self-pay | Source: Home / Self Care | Attending: Cardiology

## 2019-11-23 ENCOUNTER — Encounter (HOSPITAL_COMMUNITY): Payer: Self-pay | Admitting: Cardiology

## 2019-11-23 ENCOUNTER — Inpatient Hospital Stay (HOSPITAL_COMMUNITY)
Admission: EM | Admit: 2019-11-23 | Discharge: 2019-11-28 | DRG: 251 | Disposition: A | Discharge status: Home / Self Care | Payer: Self-pay | Attending: Cardiovascular Disease | Admitting: Cardiovascular Disease

## 2019-11-23 DIAGNOSIS — E1169 Type 2 diabetes mellitus with other specified complication: Secondary | ICD-10-CM

## 2019-11-23 DIAGNOSIS — Z720 Tobacco use: Secondary | ICD-10-CM

## 2019-11-23 DIAGNOSIS — I10 Essential (primary) hypertension: Secondary | ICD-10-CM | POA: Diagnosis present

## 2019-11-23 DIAGNOSIS — I2109 ST elevation (STEMI) myocardial infarction involving other coronary artery of anterior wall: Principal | ICD-10-CM | POA: Diagnosis present

## 2019-11-23 DIAGNOSIS — K219 Gastro-esophageal reflux disease without esophagitis: Secondary | ICD-10-CM | POA: Diagnosis present

## 2019-11-23 DIAGNOSIS — Z91041 Radiographic dye allergy status: Secondary | ICD-10-CM

## 2019-11-23 DIAGNOSIS — Z9861 Coronary angioplasty status: Secondary | ICD-10-CM

## 2019-11-23 DIAGNOSIS — F1410 Cocaine abuse, uncomplicated: Secondary | ICD-10-CM | POA: Diagnosis present

## 2019-11-23 DIAGNOSIS — I252 Old myocardial infarction: Secondary | ICD-10-CM

## 2019-11-23 DIAGNOSIS — E1165 Type 2 diabetes mellitus with hyperglycemia: Secondary | ICD-10-CM | POA: Diagnosis present

## 2019-11-23 DIAGNOSIS — Z20822 Contact with and (suspected) exposure to covid-19: Secondary | ICD-10-CM | POA: Diagnosis present

## 2019-11-23 DIAGNOSIS — I351 Nonrheumatic aortic (valve) insufficiency: Secondary | ICD-10-CM | POA: Diagnosis present

## 2019-11-23 DIAGNOSIS — Z79899 Other long term (current) drug therapy: Secondary | ICD-10-CM

## 2019-11-23 DIAGNOSIS — Z7902 Long term (current) use of antithrombotics/antiplatelets: Secondary | ICD-10-CM

## 2019-11-23 DIAGNOSIS — I241 Dressler's syndrome: Secondary | ICD-10-CM | POA: Diagnosis present

## 2019-11-23 DIAGNOSIS — E1149 Type 2 diabetes mellitus with other diabetic neurological complication: Secondary | ICD-10-CM | POA: Diagnosis present

## 2019-11-23 DIAGNOSIS — Z91018 Allergy to other foods: Secondary | ICD-10-CM

## 2019-11-23 DIAGNOSIS — I2102 ST elevation (STEMI) myocardial infarction involving left anterior descending coronary artery: Secondary | ICD-10-CM

## 2019-11-23 DIAGNOSIS — Z955 Presence of coronary angioplasty implant and graft: Secondary | ICD-10-CM

## 2019-11-23 DIAGNOSIS — B961 Klebsiella pneumoniae [K. pneumoniae] as the cause of diseases classified elsewhere: Secondary | ICD-10-CM | POA: Diagnosis present

## 2019-11-23 DIAGNOSIS — E118 Type 2 diabetes mellitus with unspecified complications: Secondary | ICD-10-CM | POA: Diagnosis present

## 2019-11-23 DIAGNOSIS — Z794 Long term (current) use of insulin: Secondary | ICD-10-CM

## 2019-11-23 DIAGNOSIS — E1142 Type 2 diabetes mellitus with diabetic polyneuropathy: Secondary | ICD-10-CM | POA: Diagnosis present

## 2019-11-23 DIAGNOSIS — E785 Hyperlipidemia, unspecified: Secondary | ICD-10-CM | POA: Diagnosis present

## 2019-11-23 DIAGNOSIS — F17210 Nicotine dependence, cigarettes, uncomplicated: Secondary | ICD-10-CM | POA: Diagnosis present

## 2019-11-23 DIAGNOSIS — E7800 Pure hypercholesterolemia, unspecified: Secondary | ICD-10-CM | POA: Diagnosis present

## 2019-11-23 DIAGNOSIS — I213 ST elevation (STEMI) myocardial infarction of unspecified site: Secondary | ICD-10-CM

## 2019-11-23 DIAGNOSIS — N390 Urinary tract infection, site not specified: Secondary | ICD-10-CM | POA: Diagnosis present

## 2019-11-23 DIAGNOSIS — R079 Chest pain, unspecified: Secondary | ICD-10-CM

## 2019-11-23 DIAGNOSIS — I25119 Atherosclerotic heart disease of native coronary artery with unspecified angina pectoris: Secondary | ICD-10-CM | POA: Diagnosis present

## 2019-11-23 DIAGNOSIS — Z833 Family history of diabetes mellitus: Secondary | ICD-10-CM

## 2019-11-23 DIAGNOSIS — Z7982 Long term (current) use of aspirin: Secondary | ICD-10-CM

## 2019-11-23 DIAGNOSIS — I2510 Atherosclerotic heart disease of native coronary artery without angina pectoris: Secondary | ICD-10-CM

## 2019-11-23 HISTORY — DX: Cocaine abuse, uncomplicated: F14.10

## 2019-11-23 HISTORY — PX: LEFT HEART CATH AND CORONARY ANGIOGRAPHY: CATH118249

## 2019-11-23 HISTORY — PX: CORONARY/GRAFT ACUTE MI REVASCULARIZATION: CATH118305

## 2019-11-23 LAB — COMPREHENSIVE METABOLIC PANEL
ALT: 19 U/L (ref 0–44)
AST: 16 U/L (ref 15–41)
Albumin: 3.6 g/dL (ref 3.5–5.0)
Alkaline Phosphatase: 74 U/L (ref 38–126)
Anion gap: 11 (ref 5–15)
BUN: 18 mg/dL (ref 6–20)
CO2: 18 mmol/L — ABNORMAL LOW (ref 22–32)
Calcium: 9 mg/dL (ref 8.9–10.3)
Chloride: 107 mmol/L (ref 98–111)
Creatinine, Ser: 1.04 mg/dL — ABNORMAL HIGH (ref 0.44–1.00)
GFR calc Af Amer: >60 mL/min (ref >60)
GFR calc non Af Amer: >60 mL/min (ref >60)
Glucose, Bld: 212 mg/dL — ABNORMAL HIGH (ref 70–99)
Potassium: 3.8 mmol/L (ref 3.5–5.1)
Sodium: 136 mmol/L (ref 135–145)
Total Bilirubin: 0.4 mg/dL (ref 0.3–1.2)
Total Protein: 6.9 g/dL (ref 6.5–8.1)

## 2019-11-23 LAB — POCT I-STAT, CHEM 8
BUN: 20 mg/dL (ref 6–20)
Calcium, Ion: 1.21 mmol/L (ref 1.15–1.40)
Chloride: 108 mmol/L (ref 98–111)
Creatinine, Ser: 1 mg/dL (ref 0.44–1.00)
Glucose, Bld: 213 mg/dL — ABNORMAL HIGH (ref 70–99)
HCT: 35 % — ABNORMAL LOW (ref 36.0–46.0)
Hemoglobin: 11.9 g/dL — ABNORMAL LOW (ref 12.0–15.0)
Potassium: 3.8 mmol/L (ref 3.5–5.1)
Sodium: 138 mmol/L (ref 135–145)
TCO2: 22 mmol/L (ref 22–32)

## 2019-11-23 LAB — HEMOGLOBIN A1C
Hgb A1c MFr Bld: 9.6 % — ABNORMAL HIGH (ref 4.8–5.6)
Mean Plasma Glucose: 228.82 mg/dL

## 2019-11-23 LAB — RESPIRATORY PANEL BY RT PCR (FLU A&B, COVID)
Influenza A by PCR: NEGATIVE
Influenza B by PCR: NEGATIVE
SARS Coronavirus 2 by RT PCR: NEGATIVE

## 2019-11-23 LAB — CBC
HCT: 35.9 % — ABNORMAL LOW (ref 36.0–46.0)
Hemoglobin: 11.7 g/dL — ABNORMAL LOW (ref 12.0–15.0)
MCH: 29.6 pg (ref 26.0–34.0)
MCHC: 32.6 g/dL (ref 30.0–36.0)
MCV: 90.9 fL (ref 80.0–100.0)
Platelets: 251 10*3/uL (ref 150–400)
RBC: 3.95 MIL/uL (ref 3.87–5.11)
RDW: 21.7 % — ABNORMAL HIGH (ref 11.5–15.5)
WBC: 8.9 10*3/uL (ref 4.0–10.5)
nRBC: 0 % (ref 0.0–0.2)

## 2019-11-23 LAB — POCT ACTIVATED CLOTTING TIME
Activated Clotting Time: 230 seconds
Activated Clotting Time: 290 seconds
Activated Clotting Time: 335 seconds
Activated Clotting Time: 191 seconds
Activated Clotting Time: 164 seconds

## 2019-11-23 LAB — TROPONIN I (HIGH SENSITIVITY)
Troponin I (High Sensitivity): 24 ng/L — ABNORMAL HIGH (ref <18)
Troponin I (High Sensitivity): 4773 ng/L (ref <18)
Troponin I (High Sensitivity): 5913 ng/L (ref <18)
Troponin I (High Sensitivity): 9370 ng/L (ref <18)

## 2019-11-23 LAB — LIPID PANEL
Cholesterol: 176 mg/dL (ref 0–200)
HDL: 36 mg/dL — ABNORMAL LOW (ref >40)
LDL Cholesterol: 121 mg/dL — ABNORMAL HIGH (ref 0–99)
Total CHOL/HDL Ratio: 4.9 RATIO
Triglycerides: 94 mg/dL (ref <150)
VLDL: 19 mg/dL (ref 0–40)

## 2019-11-23 LAB — PROTIME-INR
INR: 1.2 (ref 0.8–1.2)
Prothrombin Time: 14.6 seconds (ref 11.4–15.2)

## 2019-11-23 LAB — APTT: aPTT: >200 seconds (ref 24–36)

## 2019-11-23 LAB — GLUCOSE, CAPILLARY
Glucose-Capillary: 303 mg/dL — ABNORMAL HIGH (ref 70–99)
Glucose-Capillary: 339 mg/dL — ABNORMAL HIGH (ref 70–99)
Glucose-Capillary: 351 mg/dL — ABNORMAL HIGH (ref 70–99)
Glucose-Capillary: 426 mg/dL — ABNORMAL HIGH (ref 70–99)

## 2019-11-23 SURGERY — CORONARY/GRAFT ACUTE MI REVASCULARIZATION
Anesthesia: LOCAL

## 2019-11-23 MED ORDER — GABAPENTIN 300 MG PO CAPS
300.0000 mg | ORAL_CAPSULE | Freq: Three times a day (TID) | ORAL | Status: DC
  Administered 2019-11-23 – 2019-11-28 (×16): 300 mg via ORAL
  Filled 2019-11-23 (×16): qty 1

## 2019-11-23 MED ORDER — TIROFIBAN HCL IN NACL 5-0.9 MG/100ML-% IV SOLN
INTRAVENOUS | Status: AC
  Filled 2019-11-23: qty 100

## 2019-11-23 MED ORDER — QUETIAPINE FUMARATE 100 MG PO TABS
200.0000 mg | ORAL_TABLET | ORAL | Status: DC
  Administered 2019-11-24 – 2019-11-28 (×5): 200 mg via ORAL
  Filled 2019-11-23 (×2): qty 1
  Filled 2019-11-23 (×4): qty 2

## 2019-11-23 MED ORDER — ACETAMINOPHEN 325 MG PO TABS
650.0000 mg | ORAL_TABLET | ORAL | Status: DC | PRN
  Administered 2019-11-23: 650 mg via ORAL
  Filled 2019-11-23: qty 2

## 2019-11-23 MED ORDER — METHYLPREDNISOLONE SODIUM SUCC 125 MG IJ SOLR
INTRAMUSCULAR | Status: AC
  Filled 2019-11-23: qty 2

## 2019-11-23 MED ORDER — HEPARIN (PORCINE) IN NACL 1000-0.9 UT/500ML-% IV SOLN
INTRAVENOUS | Status: AC
  Filled 2019-11-23: qty 1000

## 2019-11-23 MED ORDER — NITROGLYCERIN 1 MG/10 ML FOR IR/CATH LAB
INTRA_ARTERIAL | Status: DC | PRN
  Administered 2019-11-23: 200 ug via INTRACORONARY

## 2019-11-23 MED ORDER — MORPHINE SULFATE (PF) 2 MG/ML IV SOLN
INTRAVENOUS | Status: AC
  Filled 2019-11-23: qty 1

## 2019-11-23 MED ORDER — TICAGRELOR 90 MG PO TABS
ORAL_TABLET | ORAL | Status: AC
  Filled 2019-11-23: qty 2

## 2019-11-23 MED ORDER — VERAPAMIL HCL 2.5 MG/ML IV SOLN
INTRAVENOUS | Status: DC | PRN
  Administered 2019-11-23: 10 mL via INTRA_ARTERIAL

## 2019-11-23 MED ORDER — INSULIN ASPART 100 UNIT/ML ~~LOC~~ SOLN
0.0000 [IU] | Freq: Three times a day (TID) | SUBCUTANEOUS | Status: DC
  Administered 2019-11-23: 15 [IU] via SUBCUTANEOUS
  Administered 2019-11-24: 7 [IU] via SUBCUTANEOUS
  Administered 2019-11-24: 11 [IU] via SUBCUTANEOUS
  Administered 2019-11-24: 08:00:00 15 [IU] via SUBCUTANEOUS
  Administered 2019-11-25: 4 [IU] via SUBCUTANEOUS
  Administered 2019-11-25 (×2): 11 [IU] via SUBCUTANEOUS
  Administered 2019-11-26: 3 [IU] via SUBCUTANEOUS
  Administered 2019-11-26: 07:00:00 20 [IU] via SUBCUTANEOUS
  Administered 2019-11-26: 4 [IU] via SUBCUTANEOUS
  Administered 2019-11-27: 7 [IU] via SUBCUTANEOUS
  Administered 2019-11-27: 11 [IU] via SUBCUTANEOUS
  Administered 2019-11-27 – 2019-11-28 (×2): 7 [IU] via SUBCUTANEOUS
  Administered 2019-11-28: 15 [IU] via SUBCUTANEOUS
  Administered 2019-11-28: 7 [IU] via SUBCUTANEOUS

## 2019-11-23 MED ORDER — SODIUM CHLORIDE 0.9% FLUSH
3.0000 mL | Freq: Two times a day (BID) | INTRAVENOUS | Status: DC
  Administered 2019-11-23 – 2019-11-28 (×8): 3 mL via INTRAVENOUS

## 2019-11-23 MED ORDER — SODIUM CHLORIDE 0.9 % IV SOLN: INTRAVENOUS | Status: AC

## 2019-11-23 MED ORDER — CHLORHEXIDINE GLUCONATE CLOTH 2 % EX PADS
6.0000 | MEDICATED_PAD | Freq: Every day | CUTANEOUS | Status: DC
  Administered 2019-11-24 – 2019-11-28 (×4): 6 via TOPICAL

## 2019-11-23 MED ORDER — LABETALOL HCL 5 MG/ML IV SOLN: 10.0000 mg | INTRAVENOUS | Status: AC | PRN

## 2019-11-23 MED ORDER — QUETIAPINE FUMARATE 100 MG PO TABS
400.0000 mg | ORAL_TABLET | Freq: Every day | ORAL | Status: DC
  Administered 2019-11-23 – 2019-11-27 (×5): 400 mg via ORAL
  Filled 2019-11-23 (×2): qty 4
  Filled 2019-11-23: qty 1
  Filled 2019-11-23: qty 8
  Filled 2019-11-23 (×2): qty 1
  Filled 2019-11-23 (×2): qty 4

## 2019-11-23 MED ORDER — CYCLOBENZAPRINE HCL 10 MG PO TABS
10.0000 mg | ORAL_TABLET | Freq: Three times a day (TID) | ORAL | Status: DC | PRN
  Administered 2019-11-23: 10 mg via ORAL
  Filled 2019-11-23 (×2): qty 1

## 2019-11-23 MED ORDER — TICAGRELOR 90 MG PO TABS
ORAL_TABLET | ORAL | Status: AC
  Filled 2019-11-23: qty 1

## 2019-11-23 MED ORDER — FENTANYL CITRATE (PF) 100 MCG/2ML IJ SOLN
INTRAMUSCULAR | Status: DC | PRN
  Administered 2019-11-23 (×2): 50 ug via INTRAVENOUS

## 2019-11-23 MED ORDER — FUROSEMIDE 10 MG/ML IJ SOLN
INTRAMUSCULAR | Status: AC
  Filled 2019-11-23: qty 4

## 2019-11-23 MED ORDER — HEPARIN SODIUM (PORCINE) 1000 UNIT/ML IJ SOLN
INTRAMUSCULAR | Status: AC
  Filled 2019-11-23: qty 1

## 2019-11-23 MED ORDER — INSULIN ASPART PROT & ASPART (70-30 MIX) 100 UNIT/ML ~~LOC~~ SUSP
30.0000 [IU] | Freq: Two times a day (BID) | SUBCUTANEOUS | Status: DC
  Administered 2019-11-23 – 2019-11-25 (×5): 30 [IU] via SUBCUTANEOUS
  Filled 2019-11-23: qty 10

## 2019-11-23 MED ORDER — NITROGLYCERIN IN D5W 200-5 MCG/ML-% IV SOLN
INTRAVENOUS | Status: AC | PRN
  Administered 2019-11-23: 10 ug/min via INTRAVENOUS

## 2019-11-23 MED ORDER — HEPARIN (PORCINE) 25000 UT/250ML-% IV SOLN
1200.0000 [IU]/h | INTRAVENOUS | Status: DC
  Administered 2019-11-23 – 2019-11-27 (×4): 1000 [IU]/h via INTRAVENOUS
  Filled 2019-11-23 (×4): qty 250

## 2019-11-23 MED ORDER — HYDRALAZINE HCL 20 MG/ML IJ SOLN: 10.0000 mg | INTRAMUSCULAR | Status: AC | PRN

## 2019-11-23 MED ORDER — TIROFIBAN HCL IN NACL 5-0.9 MG/100ML-% IV SOLN
0.1500 ug/kg/min | INTRAVENOUS | Status: AC
  Administered 2019-11-23: 0.15 ug/kg/min via INTRAVENOUS

## 2019-11-23 MED ORDER — DIPHENHYDRAMINE HCL 50 MG/ML IJ SOLN
INTRAMUSCULAR | Status: AC
  Filled 2019-11-23: qty 1

## 2019-11-23 MED ORDER — ATORVASTATIN CALCIUM 80 MG PO TABS
80.0000 mg | ORAL_TABLET | Freq: Every day | ORAL | Status: DC
  Administered 2019-11-23 – 2019-11-27 (×5): 80 mg via ORAL
  Filled 2019-11-23 (×5): qty 1

## 2019-11-23 MED ORDER — NITROGLYCERIN 0.4 MG SL SUBL
0.4000 mg | SUBLINGUAL_TABLET | SUBLINGUAL | Status: DC | PRN
  Filled 2019-11-23: qty 1

## 2019-11-23 MED ORDER — NITROGLYCERIN IN D5W 200-5 MCG/ML-% IV SOLN
0.0000 ug/min | INTRAVENOUS | Status: DC
  Administered 2019-11-23: 10 ug/min via INTRAVENOUS
  Filled 2019-11-23: qty 250

## 2019-11-23 MED ORDER — HEPARIN SODIUM (PORCINE) 1000 UNIT/ML IJ SOLN
INTRAMUSCULAR | Status: DC | PRN
  Administered 2019-11-23: 2000 [IU] via INTRAVENOUS
  Administered 2019-11-23: 4000 [IU] via INTRAVENOUS
  Administered 2019-11-23: 3000 [IU] via INTRAVENOUS
  Administered 2019-11-23: 4000 [IU] via INTRAVENOUS

## 2019-11-23 MED ORDER — POLYETHYLENE GLYCOL 3350 17 GM/SCOOP PO POWD
17.0000 g | Freq: Every day | ORAL | Status: DC | PRN
  Filled 2019-11-23: qty 255

## 2019-11-23 MED ORDER — METOPROLOL TARTRATE 50 MG PO TABS
50.0000 mg | ORAL_TABLET | Freq: Two times a day (BID) | ORAL | Status: DC
  Administered 2019-11-23 – 2019-11-24 (×3): 50 mg via ORAL
  Filled 2019-11-23 (×3): qty 1

## 2019-11-23 MED ORDER — FENTANYL CITRATE (PF) 100 MCG/2ML IJ SOLN
INTRAMUSCULAR | Status: AC
  Filled 2019-11-23: qty 2

## 2019-11-23 MED ORDER — ONDANSETRON HCL 4 MG/2ML IJ SOLN: 4.0000 mg | Freq: Four times a day (QID) | INTRAMUSCULAR | Status: DC | PRN

## 2019-11-23 MED ORDER — TIROFIBAN (AGGRASTAT) BOLUS VIA INFUSION
INTRAVENOUS | Status: DC | PRN
  Administered 2019-11-23: 2041.175 ug via INTRAVENOUS

## 2019-11-23 MED ORDER — SODIUM CHLORIDE 0.9 % IV SOLN: 250.0000 mL | INTRAVENOUS | Status: DC | PRN

## 2019-11-23 MED ORDER — FUROSEMIDE 10 MG/ML IJ SOLN
INTRAMUSCULAR | Status: DC | PRN
  Administered 2019-11-23: 40 mg via INTRAVENOUS

## 2019-11-23 MED ORDER — TICAGRELOR 90 MG PO TABS
ORAL_TABLET | ORAL | Status: DC | PRN
  Administered 2019-11-23: 180 mg via ORAL

## 2019-11-23 MED ORDER — IOHEXOL 350 MG/ML SOLN
INTRAVENOUS | Status: DC | PRN
  Administered 2019-11-23: 225 mL

## 2019-11-23 MED ORDER — NITROGLYCERIN 1 MG/10 ML FOR IR/CATH LAB
INTRA_ARTERIAL | Status: AC
  Filled 2019-11-23: qty 10

## 2019-11-23 MED ORDER — MONTELUKAST SODIUM 10 MG PO TABS
10.0000 mg | ORAL_TABLET | Freq: Every day | ORAL | Status: DC
  Administered 2019-11-23 – 2019-11-27 (×5): 10 mg via ORAL
  Filled 2019-11-23 (×5): qty 1

## 2019-11-23 MED ORDER — LIDOCAINE HCL (PF) 1 % IJ SOLN
INTRAMUSCULAR | Status: AC
  Filled 2019-11-23: qty 30

## 2019-11-23 MED ORDER — ASPIRIN 81 MG PO CHEW
81.0000 mg | CHEWABLE_TABLET | Freq: Every day | ORAL | Status: DC
  Administered 2019-11-24 – 2019-11-28 (×5): 81 mg via ORAL
  Filled 2019-11-23 (×5): qty 1

## 2019-11-23 MED ORDER — LIDOCAINE HCL (PF) 1 % IJ SOLN
INTRAMUSCULAR | Status: DC | PRN
  Administered 2019-11-23: 2 mL
  Administered 2019-11-23: 20 mL

## 2019-11-23 MED ORDER — SODIUM CHLORIDE 0.9% FLUSH: 3.0000 mL | INTRAVENOUS | Status: DC | PRN

## 2019-11-23 MED ORDER — IOHEXOL 350 MG/ML SOLN
INTRAVENOUS | Status: AC
  Filled 2019-11-23: qty 1

## 2019-11-23 MED ORDER — MIDAZOLAM HCL 2 MG/2ML IJ SOLN
INTRAMUSCULAR | Status: AC
  Filled 2019-11-23: qty 2

## 2019-11-23 MED ORDER — CLONIDINE HCL 0.1 MG PO TABS
0.1000 mg | ORAL_TABLET | Freq: Two times a day (BID) | ORAL | Status: DC
  Administered 2019-11-23 – 2019-11-28 (×10): 0.1 mg via ORAL
  Filled 2019-11-23 (×10): qty 1

## 2019-11-23 MED ORDER — MORPHINE SULFATE (PF) 2 MG/ML IV SOLN
2.0000 mg | INTRAVENOUS | Status: DC | PRN
  Administered 2019-11-23 – 2019-11-27 (×25): 2 mg via INTRAVENOUS
  Filled 2019-11-23 (×24): qty 1

## 2019-11-23 MED ORDER — PANTOPRAZOLE SODIUM 40 MG PO TBEC
40.0000 mg | DELAYED_RELEASE_TABLET | Freq: Two times a day (BID) | ORAL | Status: DC
  Administered 2019-11-23 – 2019-11-28 (×10): 40 mg via ORAL
  Filled 2019-11-23 (×10): qty 1

## 2019-11-23 MED ORDER — MIDAZOLAM HCL 2 MG/2ML IJ SOLN
INTRAMUSCULAR | Status: DC | PRN
  Administered 2019-11-23 (×2): 1 mg via INTRAVENOUS

## 2019-11-23 MED ORDER — TICAGRELOR 90 MG PO TABS
90.0000 mg | ORAL_TABLET | Freq: Two times a day (BID) | ORAL | Status: DC
  Administered 2019-11-23 – 2019-11-28 (×10): 90 mg via ORAL
  Filled 2019-11-23 (×10): qty 1

## 2019-11-23 MED ORDER — DIPHENHYDRAMINE HCL 50 MG/ML IJ SOLN
INTRAMUSCULAR | Status: DC | PRN
  Administered 2019-11-23: 50 mg via INTRAVENOUS

## 2019-11-23 MED ORDER — FAMOTIDINE IN NACL 20-0.9 MG/50ML-% IV SOLN
INTRAVENOUS | Status: AC | PRN
  Administered 2019-11-23: 20 mg via INTRAVENOUS

## 2019-11-23 MED ORDER — METHYLPREDNISOLONE SODIUM SUCC 125 MG IJ SOLR
INTRAMUSCULAR | Status: DC | PRN
  Administered 2019-11-23: 125 mg via INTRAVENOUS

## 2019-11-23 MED ORDER — TIROFIBAN HCL IN NACL 5-0.9 MG/100ML-% IV SOLN
INTRAVENOUS | Status: AC | PRN
  Administered 2019-11-23: 0.075 ug/kg/min via INTRAVENOUS

## 2019-11-23 MED ORDER — VERAPAMIL HCL 2.5 MG/ML IV SOLN
INTRAVENOUS | Status: AC
  Filled 2019-11-23: qty 2

## 2019-11-23 SURGICAL SUPPLY — 22 items
BALLN SAPPHIRE 2.0X12 (BALLOONS) ×2
BALLOON SAPPHIRE 2.0X12 (BALLOONS) IMPLANT
CATH INFINITI 5 FR 3DRC (CATHETERS) ×1 IMPLANT
CATH INFINITI 5FR JL4 (CATHETERS) ×1 IMPLANT
CATH INFINITI JR4 5F (CATHETERS) ×1 IMPLANT
CATH OPTITORQUE TIG 4.0 5F (CATHETERS) ×1 IMPLANT
CATH VISTA GUIDE 6FR XBLAD3.5 (CATHETERS) ×1 IMPLANT
DEVICE RAD COMP TR BAND LRG (VASCULAR PRODUCTS) ×1 IMPLANT
ELECT DEFIB PAD ADLT CADENCE (PAD) ×1 IMPLANT
GLIDESHEATH SLEND SS 6F .021 (SHEATH) ×1 IMPLANT
GUIDEWIRE INQWIRE 1.5J.035X260 (WIRE) IMPLANT
INQWIRE 1.5J .035X260CM (WIRE) ×2
KIT ENCORE 26 ADVANTAGE (KITS) ×2 IMPLANT
KIT HEART LEFT (KITS) ×2 IMPLANT
PACK CARDIAC CATHETERIZATION (CUSTOM PROCEDURE TRAY) ×2 IMPLANT
SHEATH PINNACLE 6F 10CM (SHEATH) ×1 IMPLANT
SHEATH PROBE COVER 6X72 (BAG) ×1 IMPLANT
TRANSDUCER W/STOPCOCK (MISCELLANEOUS) ×2 IMPLANT
TUBING CIL FLEX 10 FLL-RA (TUBING) ×2 IMPLANT
WIRE ASAHI PROWATER 180CM (WIRE) ×1 IMPLANT
WIRE EMERALD 3MM-J .035X150CM (WIRE) ×1 IMPLANT
WIRE PT2 MS 185 (WIRE) ×1 IMPLANT

## 2019-11-23 NOTE — Progress Notes (Signed)
ANTICOAGULATION CONSULT NOTE - Initial Consult  Pharmacy Consult for aggrastat and heparin  Indication: chest pain/ACS  Allergies  Allergen Reactions  . Contrast Media [Iodinated Diagnostic Agents] Hives and Itching  . Tomato Itching and Rash    Patient Measurements:   Heparin Dosing Weight: 68kg  Vital Signs: BP: 160/74 (04/23 1255) Pulse Rate: 80 (04/23 1255)  Labs: Recent Labs    11/23/19 1113  HGB 11.7*  HCT 35.9*  PLT 251  APTT >200*  LABPROT 14.6  INR 1.2  CREATININE 1.04*  TROPONINIHS 24*    CrCl cannot be calculated (Unknown ideal weight.).   Medical History: Past Medical History:  Diagnosis Date  . Acid reflux   . Cocaine abuse (HCC)   . Diabetes mellitus without complication (HCC)   . Heart attack (HCC) 04/2016  . High cholesterol   . Hypertension    Assessment: 49 year old female presents as stemi. Patient taken emergently to cath lab for intervention. Unsuccessful balloon angioplasty performed, no stent placed. New orders received to start tirofiban and heparin post cath. No bleeding complications postcath, CBC wnl.   Goal of Therapy:  Heparin level 0.3-0.5 units/ml Monitor platelets by anticoagulation protocol: Yes   Plan:  Tirofiban 0.27mcg/kg/min x 18 hours Start heparin infusion at 1000 units/hr 8 hours after sheath removal Check anti-Xa level in 6 hours and daily while on heparin Continue to monitor H&H and platelets  Sheppard Coil PharmD., BCPS Clinical Pharmacist 11/23/2019 2:15 PM

## 2019-11-23 NOTE — ED Provider Notes (Signed)
[facility]ERGENCY DEPARTMENT Provider Note   CSN: [id number] Arrival date & time: 11/23/19  1037     History No chief complaint on file. STEMI  Maria Harper is a 49 y.o. female.  The history is provided by the Maria Harper and the EMS personnel.  Chest Pain Pain location:  Substernal area Pain quality: aching, crushing, pressure and radiating (r arm)   Pain radiates to:  R arm Pain severity:  Severe Onset quality:  Sudden Duration:  2 hours Timing:  Constant Progression:  Improving Chronicity:  Recurrent Relieved by:  Rest and nitroglycerin Worsened by:  Exertion Ineffective treatments:  Aspirin Associated symptoms: shortness of breath   Associated symptoms: no altered mental status, no anxiety, no back pain, no cough, no diaphoresis, no fatigue, no fever, no headache, no heartburn, no lower extremity edema, no nausea, no numbness, no palpitations, no vomiting and no weakness   Risk factors: coronary artery disease, diabetes mellitus, high cholesterol and hypertension   Risk factors: not female        Past Medical History:  Diagnosis Date  . Acid reflux   . Diabetes mellitus without complication ( [place])   . Heart attack ([place]) 04/2016  . High cholesterol   . Hypertension     Maria Harper Active Problem List   Diagnosis Date Noted  . Sepsis ([place]) 09/19/2019  . Cocaine abuse with cocaine-induced mood disorder ([place]) 06/10/2017    No past surgical history on file.   OB History   No obstetric history on file.     No family history on file.  Social History   Tobacco Use  . Smoking status: Current Every Day Smoker    Packs/day: 1.50    Years: 20.00    Pack years: 30.00    Types: Cigarettes  . Smokeless tobacco: Never Used  Substance Use Topics  . Alcohol use: Not Currently  . Drug use: Not Currently    Comment: last used yesterday. States " I do it everyday"     Home Medications Prior to Admission medications   Medication Sig Start Date  End Date Taking? Authorizing Provider  albuterol (VENTOLIN HFA) 108 (90 Base) MCG/ACT inhaler Inhale 2 puffs into the lungs every 6 (six) hours as needed for wheezing or shortness of breath.    [provider]  aspirin 81 MG chewable tablet Chew 81 mg by mouth daily.    [provider]  atorvastatin (LIPITOR) 80 MG tablet Take 80 mg by mouth daily at 6 PM.  04/05/18   [provider]  cloNIDine (CATAPRES) 0.1 MG tablet Take 0.1 mg by mouth 2 (two) times daily. 12/20/17   [provider]  clopidogrel (PLAVIX) 75 MG tablet Take 75 mg by mouth daily.     [provider]  cyclobenzaprine (FLEXERIL) 10 MG tablet Take 10 mg by mouth 3 (three) times daily as needed for muscle spasms.     [provider]  gabapentin (NEURONTIN) 300 MG capsule Take 300 mg by mouth 3 (three) times daily.    [provider]  glipiZIDE (GLUCOTROL) 5 MG tablet Take 5 mg by mouth 2 (two) times daily before a meal.  12/20/17   [provider]  HUMULIN R 100 UNIT/ML injection Inject 17 Units into the skin 2 (two) times daily.  06/13/18   [provider]  ibuprofen (ADVIL) 800 MG tablet Take 800 mg by mouth every 8 (eight) hours as needed for moderate pain.    [provider]  insulin NPH-regular Human (HUMULIN 70/30) (70-30) 100 UNIT/ML injection Inject 35 Units into the skin 2 (two) times daily.  07/01/17   [provider]  metoprolol tartrate (LOPRESSOR) 50 MG tablet Take 50 mg by mouth 2 (two) times daily.    [provider]  montelukast (SINGULAIR) 10 MG tablet Take 10 mg by mouth at bedtime.    [provider]  nitroGLYCERIN (NITROSTAT) 0.4 MG SL tablet Place 0.4 mg under the tongue as needed for chest pain.  04/05/18   [provider]  pantoprazole (PROTONIX) 40 MG tablet Take 40 mg by mouth 2 (two) times daily.     [provider]  polyethylene glycol powder (GLYCOLAX/MIRALAX) powder Take 17 g by  mouth daily as needed for moderate constipation.  04/05/18   [provider]  QUEtiapine (SEROQUEL) 200 MG tablet Take 200 mg by mouth every morning.  04/05/18   [provider]  QUEtiapine (SEROQUEL) 400 MG tablet Take 400 mg by mouth at bedtime. 04/05/18   [provider]    Allergies    Contrast media [iodinated diagnostic agents] and Tomato  Review of Systems   Review of Systems  Constitutional: Negative for chills, diaphoresis, fatigue and fever.  HENT: Negative for congestion.   Respiratory: Positive for shortness of breath. Negative for cough, chest tightness and wheezing.   Cardiovascular: Positive for chest pain. Negative for palpitations and leg swelling.  Gastrointestinal: Negative for constipation, heartburn, nausea and vomiting.  Genitourinary: Negative for flank pain.  Musculoskeletal: Negative for back pain and neck pain.  Skin: Negative for wound.  Neurological: Negative for weakness, numbness and headaches.  Psychiatric/Behavioral: Negative for agitation.  All other systems reviewed and are negative.   Physical Exam Updated Vital Signs There were no vitals taken for this visit.  Physical Exam Vitals and nursing note reviewed.  Constitutional:      General: Maria Harper is not in acute distress.    Appearance: Maria Harper is well-developed. Maria Harper is not ill-appearing, toxic-appearing or diaphoretic.  HENT:     Head: Normocephalic and atraumatic.     Right Ear: External ear normal.     Left Ear: External ear normal.     Nose: Nose normal.     Mouth/Throat:     Mouth: Mucous membranes are moist.     Pharynx: No oropharyngeal exudate or posterior oropharyngeal erythema.  Eyes:     Conjunctiva/sclera: Conjunctivae normal.     Pupils: Pupils are equal, round, and reactive to light.  Cardiovascular:     Rate and Rhythm: Normal rate.     Pulses: Normal pulses.     Heart sounds: No murmur.  Pulmonary:     Effort: Pulmonary effort is normal. No respiratory  distress.     Breath sounds: No stridor. No wheezing, rhonchi or rales.  Chest:     Chest wall: No tenderness.  Abdominal:     General: Abdomen is flat. There is no distension.     Tenderness: There is no abdominal tenderness. There is no rebound.  Musculoskeletal:        General: No tenderness.     Cervical back: Normal range of motion and neck supple. No tenderness.  Skin:    General: Skin is warm.     Capillary Refill: Capillary refill takes less than 2 seconds.     Coloration: Skin is not pale.     Findings: No erythema or rash.  Neurological:     Mental Status: Maria Harper is alert and oriented to  person, place, and time.     Motor: No abnormal muscle tone.     Deep Tendon Reflexes: Reflexes are normal and symmetric.  Psychiatric:        Mood and Affect: Mood normal.     ED Results / Procedures / Treatments   Labs (all labs ordered are listed, but only abnormal results are displayed) Labs Reviewed  RESPIRATORY PANEL BY RT PCR (FLU A&B, COVID)    EKG None  Radiology No results found.  Procedures Procedures (including critical care time)  CRITICAL CARE Performed by: Canary Brim Shantrell Placzek Total critical care time: 20 minutes Critical care time was exclusive of separately billable procedures and treating other patients. Critical care was necessary to treat or prevent imminent or life-threatening deterioration. Critical care was time spent personally by me on the following activities: development of treatment plan with Maria Harper and/or surrogate as well as nursing, discussions with consultants, evaluation of Maria Harper's response to treatment, examination of Maria Harper, obtaining history from Maria Harper or surrogate, ordering and performing treatments and interventions, ordering and review of laboratory studies, ordering and review of radiographic studies, pulse oximetry and re-evaluation of Maria Harper's condition.   Medications Ordered in ED Medications - No data to display  ED Course   Maria Harper have reviewed the triage vital signs and the nursing notes.  Pertinent labs & imaging results that were available during my care of the Maria Harper were reviewed by me and considered in my medical decision making (see chart for details).    MDM Rules/Calculators/A&P                      Love Chowning is a 49 y.o. female with a past medical history significant for hypertension, diabetes, hypercholesterolemia, GERD, and prior MI who presents as a code STEMI activated by EMS for chest pain.  Maria Harper reports that Maria Harper has been well the last few days but this morning while on a walk up a hill, started having crushing central chest pain that feels "the same" as Maria Harper last heart attack.  Maria Harper reports Maria Harper heart attack was in [place] and we do not have records on this.  Maria Harper says that Maria Harper has had no diaphoresis, nausea, vomiting but does report shortness of breath.  Maria Harper denies leg pain or leg swelling.  No recent trauma.  No recent Covid exposures.  No recent cough, congestion, or URI symptoms.  Maria Harper denies any other complaints.  Maria Harper reports that Maria Harper took Maria Harper home nitro and Maria Harper symptoms improved.  Maria Harper described the max was 10 out of 10 in severity and Maria Harper does still say Maria Harper has 10 out of 10 despite saying it is somewhat better after nitro.  Maria Harper reports feeling lightheaded but denies syncope.  EMS had EKG showing STEMI prompting activation.  On arrival, Maria Harper reviewed the telemetry strip with EMS and it does show concern for STEMI.  Maria Harper still having severe pain.  Lungs clear and chest is nontender, Maria Harper cannot reproduce Maria Harper discomfort.  Maria Harper did feel pulses in all extremities and Maria Harper legs were nontender nonedematous.  Maria Harper is very anxious.  The cardiology team came to the stretcher at the bridge before Maria Harper was taken to exam room.  They do want to take Maria Harper to the Cath Lab requested a Covid test.  Maria Harper was reluctant but we convinced Maria Harper to get a Covid swab.  Maria Harper was then taken to the Cath Lab for further management  of STEMI.  Maria Harper will be made by cardiology after Cath  Lab management.   Final Clinical Impression(s) / ED Diagnoses Final diagnoses:  ST elevation myocardial infarction (STEMI), unspecified artery (HCC)  Chest pain, unspecified type    Rx / DC Orders ED Discharge Orders    None     Clinical Impression: 1. ST elevation myocardial infarction (STEMI), unspecified artery (HCC)   2. Chest pain, unspecified type     Disposition: Admit  This note was prepared with assistance of Dragon voice recognition software. Occasional wrong-word or sound-a-like substitutions may have occurred due to the inherent limitations of voice recognition software.     Akayla Brass, Canary Brim, MD 11/23/19 1047

## 2019-11-23 NOTE — H&P (Addendum)
   Brief note, full note to follow.  49 year old woman with a history of coronary disease with PCI unknown location either in 2018 and 2019.  She is currently on Plavix.  She also has report history of cocaine use but no other history listed.  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  Principal Problem:   Acute ST elevation myocardial infarction (STEMI) of anteroseptal wall (HCC) Active Problems:   CAD S/P percutaneous coronary angioplasty   Type II diabetes mellitus with neurological manifestations, uncontrolled (HCC)   Patient was in her usual state of health until roughly 8:00 this morning.  She was walking up a hill and began having 8 out of 10 chest pain.  It did not relieve with rest.  EMS was contacted and upon arrival was found to have 4 mm ST elevations in V2 and V3 with 2 mm in V4.  Minimal ST segment changes in ruminal 1, aVF and V5 6+ V5.  No ST segment depressions noted. Per EMS, code STEMI called.  Patient came into the emergency room and [facility].  After initially refusing, agreed to have Covid swab.  Now presents the Cath Lab for emergent cardiac catheterization.  Emergency consent implied.  Full H&P note to follow.   Bryan Lemma, MD

## 2019-11-23 NOTE — Progress Notes (Signed)
Inpatient Diabetes Program Recommendations  AACE/ADA: New Consensus Statement on Inpatient Glycemic Control (2015)  Target Ranges:  Prepandial:   less than 140 mg/dL      Peak postprandial:   less than 180 mg/dL (1-2 hours)      Critically ill patients:  140 - 180 mg/dL   Lab Results  Component Value Date   GLUCAP 308 (H) 09/21/2019   HGBA1C 9.6 (H) 11/23/2019    Review of Glycemic Control Results for ONISHA, CEDENO (MRN [id number]) as of 11/23/2019 14:31  Ref. Range 11/23/2019 11:13  Glucose Latest Ref Range: 70 - 99 mg/dL 188 (H)  Results for BOBBY, RAGAN (MRN [id number]) as of 11/23/2019 14:31  Ref. Range 09/21/2019 10:46 11/23/2019 11:13  Hemoglobin A1C Latest Ref Range: 4.8 - 5.6 % 13.7 (H) 9.6 (H)   Diabetes history: DM 2 Outpatient Diabetes medications:  Glucotrol 5 mg bid, Humulin R 17 units bid, Humulin 70/30 35 units bid.  Current orders for Inpatient glycemic control:  Novolog resistant tid with meals Novolog 70/30 mix 30 units bid  Inpatient Diabetes Program Recommendations:    Referral received for uncontrolled DM. A1C is improved since February 2021. It appears patient is doing much better with her glycemic control although still not at goal. Recommend f/u with PCP.  Patient is currently in the cath lab. Agree with current orders.   Thanks,  Beryl Meager, RN, BC-ADM Inpatient Diabetes Coordinator Pager [phone] (8a-5p)

## 2019-11-23 NOTE — Progress Notes (Signed)
Site area: Right groin a 6 french arterial sheath was removed  Site Prior to Removal:  Level 0  Pressure Applied For 20 MINUTES    Bedrest Beginning at 1615p  Manual:   Yes.    Patient Status During Pull:  stable  Post Pull Groin Site:  Level 0  Post Pull Instructions Given:  Yes.    Post Pull Pulses Present:  Yes.    Dressing Applied:  Yes.    Comments:

## 2019-11-23 NOTE — Progress Notes (Signed)
TR BAND REMOVAL  LOCATION:    Right radial   DEFLATED PER PROTOCOL:    Yes.    TIME BAND OFF / DRESSING APPLIED:    1630p   A clean dry dressing, covered with gauze    and secured with coban.  SITE UPON ARRIVAL:    Level 0  SITE AFTER BAND REMOVAL:    Level 0  CIRCULATION SENSATION AND MOVEMENT:    Within Normal Limits   Yes.    COMMENTS:

## 2019-11-23 NOTE — H&P (Signed)
Cardiology Admission History and Physical:   Patient ID: Maria Harper MRN: [id number]; DOB: 1971-06-29   Admission date: 11/23/2019  Primary Care Provider: Marliss Coots, NP Primary Cardiologist: Dr. Glenetta Hew, MD   Chief Complaint:  CP  Patient Profile:   Maria Harper is a 49 y.o. female with reported hx of MI 04/2016 @ [place], [place], HTN, DM, HLD, GERD and cocaine abuse presented with anterior STEMI.   Seen 03/2018 ER @ [facility] for CP. Echo with preserved LVEF. Mild to moderate AI. Normal exercise Myoview. See details below.   History of Present Illness:   Ms. Brymer was in her usual state of health until roughly 8:00am of 4/23.  She was walking up a hill and began having 8 out of 10 chest pain.  It did not relieve with rest.  EMS was contacted and upon arrival was found to have 4 mm ST elevations in V2 and V3 with 2 mm in V4.  Minimal ST segment changes in ruminal 1, aVF and V5 6+ V5. Code STEMI called.  Patient came into the emergency room and [facility].  After initially refusing, agreed to have Covid swab.  Taken to Cath Lab for emergent cardiac catheterization.  Past Medical History:  Diagnosis Date  . Acid reflux   . Cocaine abuse ([place])   . Diabetes mellitus without complication ([place])   . Heart attack ([place]) 04/2016  . High cholesterol   . Hypertension     No past surgical history on file.   Medications Prior to Admission: Prior to Admission medications   Medication Sig Start Date End Date Taking? Authorizing Provider  albuterol (VENTOLIN HFA) 108 (90 Base) MCG/ACT inhaler Inhale 2 puffs into the lungs every 6 (six) hours as needed for wheezing or shortness of breath.    [provider]  aspirin 81 MG chewable tablet Chew 81 mg by mouth daily.    [provider]  atorvastatin (LIPITOR) 80 MG tablet Take 80 mg by mouth daily at 6 PM.  04/05/18   [provider]  cloNIDine (CATAPRES) 0.1 MG tablet Take 0.1 mg by mouth 2  (two) times daily. 12/20/17   [provider]  clopidogrel (PLAVIX) 75 MG tablet Take 75 mg by mouth daily.     [provider]  cyclobenzaprine (FLEXERIL) 10 MG tablet Take 10 mg by mouth 3 (three) times daily as needed for muscle spasms.     [provider]  gabapentin (NEURONTIN) 300 MG capsule Take 300 mg by mouth 3 (three) times daily.    [provider]  glipiZIDE (GLUCOTROL) 5 MG tablet Take 5 mg by mouth 2 (two) times daily before a meal.  12/20/17   [provider]  HUMULIN R 100 UNIT/ML injection Inject 17 Units into the skin 2 (two) times daily.  06/13/18   [provider]  ibuprofen (ADVIL) 800 MG tablet Take 800 mg by mouth every 8 (eight) hours as needed for moderate pain.    [provider]  insulin NPH-regular Human (HUMULIN 70/30) (70-30) 100 UNIT/ML injection Inject 35 Units into the skin 2 (two) times daily.  07/01/17   [provider]  metoprolol tartrate (LOPRESSOR) 50 MG tablet Take 50 mg by mouth 2 (two) times daily.    [provider]  montelukast (SINGULAIR) 10 MG tablet Take 10 mg by mouth at bedtime.    [provider]  nitroGLYCERIN (NITROSTAT) 0.4 MG SL tablet Place 0.4 mg under the tongue as needed for  chest pain.  04/05/18   [provider]  pantoprazole (PROTONIX) 40 MG tablet Take 40 mg by mouth 2 (two) times daily.     [provider]  polyethylene glycol powder (GLYCOLAX/MIRALAX) powder Take 17 g by mouth daily as needed for moderate constipation.  04/05/18   [provider]  QUEtiapine (SEROQUEL) 200 MG tablet Take 200 mg by mouth every morning.  04/05/18   [provider]  QUEtiapine (SEROQUEL) 400 MG tablet Take 400 mg by mouth at bedtime. 04/05/18   [provider]     Allergies:    Allergies  Allergen Reactions  . Contrast Media [Iodinated Diagnostic Agents] Hives and Itching  . Tomato Itching and Rash    Social History:   Social  History   Socioeconomic History  . Marital status: Married    Spouse name: Not on file  . Number of children: Not on file  . Years of education: Not on file  . Highest education level: Not on file  Occupational History  . Not on file  Tobacco Use  . Smoking status: Current Every Day Smoker    Packs/day: 1.50    Years: 20.00    Pack years: 30.00    Types: Cigarettes  . Smokeless tobacco: Never Used  Substance and Sexual Activity  . Alcohol use: Not Currently  . Drug use: Yes    Types: "Crack" cocaine    Comment: last used yesterday. States " I do it everyday"   . Sexual activity: Not on file  Other Topics Concern  . Not on file  Social History Narrative  . Not on file   Social Determinants of Health   Financial Resource Strain:   . Difficulty of Paying Living Expenses:   Food Insecurity:   . Worried About Programme researcher, broadcasting/film/video in the Last Year:   . Barista in the Last Year:   Transportation Needs:   . Freight forwarder (Medical):   Marland Kitchen Lack of Transportation (Non-Medical):   Physical Activity:   . Days of Exercise per Week:   . Minutes of Exercise per Session:   Stress:   . Feeling of Stress :   Social Connections:   . Frequency of Communication with Friends and Family:   . Frequency of Social Gatherings with Friends and Family:   . Attends Religious Services:   . Active Member of Clubs or Organizations:   . Attends Banker Meetings:   Marland Kitchen Marital Status:   Intimate Partner Violence:   . Fear of Current or Ex-Partner:   . Emotionally Abused:   Marland Kitchen Physically Abused:   . Sexually Abused:     Family History:  The patient's family history includes Diabetes in her father, maternal grandfather, maternal grandmother, and mother.    ROS:  Please see the history of present illness. All other ROS reviewed and negative.     Physical Exam/Data:   Vitals:   11/23/19 1240 11/23/19 1245 11/23/19 1250 11/23/19 1255  BP: (!) 176/85 (!) 142/89 (!)  141/79 (!) 160/74  Pulse: 80 80 82 80  Resp: (!) 21 (!) 21 18 (!) 21  SpO2: 98% 100% 100% 100%   No intake or output data in the 24 hours ending 11/23/19 1303 Last 3 Weights 09/19/2019 02/17/2019 07/26/2018  Weight (lbs) 180 lb 164 lb 156 lb 15.5 oz  Weight (kg) 81.647 kg 74.39 kg 71.2 kg     There is no height or weight on file to  calculate BMI.  General:  Well nourished, well developed, in no acute distress HEENT: normal Lymph: no adenopathy Neck: no JVD Endocrine:  No thryomegaly Vascular: No carotid bruits; FA pulses 2+ bilaterally without bruits  Cardiac:  normal S1, S2; RRR; no murmur  Lungs:  clear to auscultation bilaterally, no wheezing, rhonchi or rales  Abd: soft, nontender, no hepatomegaly  Ext: no edema Musculoskeletal:  No deformities, BUE and BLE strength normal and equal Skin: warm and dry  Neuro:  CNs 2-12 intact, no focal abnormalities noted Psych:  Normal affect    EKG:  The ECG that was done today was personally reviewed and demonstrates NSR with anterior STE  Relevant CV Studies:  Exercise Myoview 04/2018 TECHNIQUE: Following treadmill stress, the patient underwent standard sestamibi re-injection cardiac SPECT imaging. Also following intravenous administration of 85m Technetium Sestamibi, left ventricular wall motion analysis in a cine format was  performed and evaluation of left ventricular function was performed throughout the cardiac cycle with computer generated left ventricular ejection fraction. Comparison study 06/29/2017.  Technetium 70m-Sestamibi dose: 13 mCi rest phase and 38.9 mCi stress phase  FINDINGS: No reperfusion identified. Fixed photopenia cardiac apex again seen.  On gated SPECT images, no focal wall motion abnormality identified. Calculated left ventricular ejection fraction 38%, previously measured 63%.   Echo 04/2018 @ [facility] Interpretation Summary A complete portable two-dimensional transthoracic echocardiogram with color flow  Doppler and Spectral Doppler was performed.  The left ventricle is normal in size. Proximal septal thickening is noted. The left ventricular ejection fraction is normal (60-65%). Grade I mild diastolic dysfunction; abnormal relaxation pattern. The right ventricle is grossly normal in size and function. The left atrium is mildly dilated. Possible bicuspid aortic valve. Moderately calcified aortic valve. There is a mild aortic valve gradient noted. There is mild to moderate [1-2+] aortic regurgitation present. There is no pericardial effusion.  Left Ventricle The left ventricle is normal in size. Proximal septal thickening is noted. The left ventricular ejection fraction is normal (60-65%). The left ventricular wall motion is normal. Grade I mild diastolic dysfunction; abnormal relaxation pattern.   Right Ventricle The right ventricle is grossly normal in size and function.  Atria The left atrium is mildly dilated. The right atrium is normal. The IVC is normal in size.  Mitral Valve The mitral valve is normal in structure and function. There is trace mitral regurgitation.   Tricuspid Valve The tricuspid valve leaflets are thin and pliable and the valve motion is normal. There is trace tricuspid regurgitation. Right ventricular systolic pressure is normal.  Aortic Valve Possible bicuspid aortic valve. Moderately calcified aortic valve. There is a mild aortic valve gradient noted. There is mild to moderate [1-2+] aortic regurgitation present.  Pulmonic Valve The pulmonic valve is not well seen, but is grossly normal. There is no pulmonic regurgitation.  Vessels The aortic root is normal in diameter.  Pericardium There is no pericardial effusion.  MMode/2D Measurements & Calculations IVSd: 1.6 cm            LVIDd: 4.0 cm                   LVIDs: 2.1 cm                   LVPWd: 0.93 cm       _____________________________________________________________ LV mass(C)d: 179.8 grams      Ao root diam: 2.7 cm  LV mass(C)dI: 100.8 grams/m2    Ao root area: 5.9 cm2  ACS: 1.4 cm                   LA dimension: 4.6 cm     _____________________________________________________________  LVOT diam: 1.9 cm          EDV(sp4-el): 79.3 ml LVOT area: 2.8 cm2         LVAs ap4: 11.5 cm2                   LVLs ap4: 6.4 cm                   ESV(MOD-sp4): 18.5 ml                   ESV(sp4-el): 17.5 ml     _____________________________________________________________  EDV(MOD-sp2): 90.3 ml        SV(MOD-sp4): 58.0 ml EDV(sp2-el): 93.6 ml ESV(MOD-sp2): 22.2 ml ESV(sp2-el): 21.7 ml     _____________________________________________________________  SV(sp4-el): 61.8 ml         LAV (MOD-bp) Index: 31.1 ml/m2      _____________________________________________________________ LAV(MOD-bp): 55.4 ml  Doppler Measurements & Calculations MV E max vel: 106.0 cm/sec      MV dec time: 0.21 sec MV A max vel: 129.2 cm/sec MV E/A: 0.82      _____________________________________________________________ Ao V2 max: 214.6 cm/sec        AI end-d vel: 365.1 cm/sec Ao max PG: 18.5 mmHg         AI max vel: 431.4 cm/sec Ao V2 mean: 141.5 cm/sec       AI max PG: 74.6 mmHg Ao mean PG: 9.2 mmHg Ao V2 VTI: 41.4 cm AVA(V,D): 1.8 cm2      _____________________________________________________________ LV V1 max PG: 7.1 mmHg        MR max vel: 343.3 cm/sec LV V1 max: 133.3 cm/sec        MR max PG: 47.4 mmHg      _____________________________________________________________ PA V2 max: 121.2 cm/sec        PI end-d vel: 74.5 cm/sec PA max PG: 5.9 mmHg          PI max vel: 93.1 cm/sec                     PI max PG: 3.5 mmHg      _____________________________________________________________ TR max vel: 239.6 cm/sec       RAP systole: 3.0 mmHg TR max PG: 23.4 mmHg RVSP(TR): 26.4 mmHg  Laboratory Data:  High Sensitivity Troponin:   Recent Labs  Lab 11/23/19 1113  TROPONINIHS 24*      Chemistry Recent Labs  Lab 11/23/19 1113  NA 136  K 3.8  CL 107  CO2 18*  GLUCOSE 212*  BUN 18  CREATININE 1.04*  CALCIUM 9.0  GFRNONAA >60  GFRAA >60  ANIONGAP 11    Recent Labs  Lab 11/23/19 1113  PROT 6.9  ALBUMIN 3.6  AST 16  ALT 19  ALKPHOS 74  BILITOT 0.4   Hematology   Radiology/Studies:  CARDIAC CATHETERIZATION  Result Date: 11/23/2019  Previously placed Mid LAD to Dist LAD stent (unknown type) is widely patent.  Mid LAD lesion is 30% stenosed.  CULPRIT LESION: Dist LAD-1 lesion is 100% stenosed with 100% stenosed side branch in 3rd Diag.  Unsuccessful balloon angioplasty was performed using a BALLOON SAPPHIRE 2.0X12 -unable to advance into the distal vessel.  Post intervention, there is a 70% residual stenosis followed by Dist LAD-2 lesion is 99% stenosed. Post  intervention, the side branch was remained at 100% residual stenosis.  --------------------------  RPDA lesion is 80% stenosed-tapers into very small vessel..  LV end diastolic pressure is severely elevated. Therefore no the ventriculogram was performed.  There is no aortic valve stenosis.     TIMI Risk Score for ST  Elevation MI:   The patient's TIMI risk score is 3, which indicates a 4.4% risk of all cause mortality at 30 days.    Assessment and Plan:   1. STEMI with hx of CAD - Prior hx of MI with stenting (unknown location) - On Plavix - pending emergent cath   2. DM - SSI while here - get A1c  3. HTN  Severity of Illness: The appropriate patient status for this patient is INPATIENT. Inpatient status is judged to be reasonable and necessary in order  to provide the required intensity of service to ensure the patient's safety. The patient's presenting symptoms, physical exam findings, and initial radiographic and laboratory data in the context of their chronic comorbidities is felt to place them at high risk for further clinical deterioration. Furthermore, it is not anticipated that the patient will be medically stable for discharge from the hospital within 2 midnights of admission. The following factors support the patient status of inpatient.   " The patient's presenting symptoms include CP. " The worrisome physical exam findings include None " The initial radiographic and laboratory data are worrisome because of STEMI " The chronic co-morbidities include hx of MI, cocaine abuse, HTN, DM   * I certify that at the point of admission it is my clinical judgment that the patient will require inpatient hospital care spanning beyond 2 midnights from the point of admission due to high intensity of service, high risk for further deterioration and high frequency of surveillance required.*    For questions or updates, please contact [facility] Please consult [email or web address] for contact info under        Signed, Manson Passey, PA-C

## 2019-11-23 NOTE — Progress Notes (Signed)
Rt forearm firm, nontender. BP cuff applied to rt forearm, inflated to . Rt arm elevated on pillow. Rt hand and fingers warm and pink, sensation present.

## 2019-11-23 NOTE — Progress Notes (Signed)
CRITICAL VALUE ALERT  Critical Value:  Troponin 4,773   Date & Time Notied:  11/23/2019 6:53 PM   Provider Notified: expected value post STEMI and cath lab

## 2019-11-24 ENCOUNTER — Inpatient Hospital Stay (HOSPITAL_COMMUNITY): Payer: Self-pay

## 2019-11-24 DIAGNOSIS — E785 Hyperlipidemia, unspecified: Secondary | ICD-10-CM

## 2019-11-24 DIAGNOSIS — E118 Type 2 diabetes mellitus with unspecified complications: Secondary | ICD-10-CM

## 2019-11-24 DIAGNOSIS — I351 Nonrheumatic aortic (valve) insufficiency: Secondary | ICD-10-CM

## 2019-11-24 DIAGNOSIS — E1165 Type 2 diabetes mellitus with hyperglycemia: Secondary | ICD-10-CM

## 2019-11-24 DIAGNOSIS — F17200 Nicotine dependence, unspecified, uncomplicated: Secondary | ICD-10-CM

## 2019-11-24 DIAGNOSIS — I10 Essential (primary) hypertension: Secondary | ICD-10-CM

## 2019-11-24 LAB — CBC
HCT: 33.5 % — ABNORMAL LOW (ref 36.0–46.0)
Hemoglobin: 10.9 g/dL — ABNORMAL LOW (ref 12.0–15.0)
MCH: 29.3 pg (ref 26.0–34.0)
MCHC: 32.5 g/dL (ref 30.0–36.0)
MCV: 90.1 fL (ref 80.0–100.0)
Platelets: 259 10*3/uL (ref 150–400)
RBC: 3.72 MIL/uL — ABNORMAL LOW (ref 3.87–5.11)
RDW: 21.7 % — ABNORMAL HIGH (ref 11.5–15.5)
WBC: 11.2 10*3/uL — ABNORMAL HIGH (ref 4.0–10.5)
nRBC: 0 % (ref 0.0–0.2)

## 2019-11-24 LAB — BASIC METABOLIC PANEL
Anion gap: 12 (ref 5–15)
BUN: 21 mg/dL — ABNORMAL HIGH (ref 6–20)
CO2: 20 mmol/L — ABNORMAL LOW (ref 22–32)
Calcium: 9.1 mg/dL (ref 8.9–10.3)
Chloride: 101 mmol/L (ref 98–111)
Creatinine, Ser: 1.25 mg/dL — ABNORMAL HIGH (ref 0.44–1.00)
GFR calc Af Amer: 60 mL/min — ABNORMAL LOW (ref >60)
GFR calc non Af Amer: 52 mL/min — ABNORMAL LOW (ref >60)
Glucose, Bld: 349 mg/dL — ABNORMAL HIGH (ref 70–99)
Potassium: 3.9 mmol/L (ref 3.5–5.1)
Sodium: 133 mmol/L — ABNORMAL LOW (ref 135–145)

## 2019-11-24 LAB — GLUCOSE, CAPILLARY
Glucose-Capillary: 317 mg/dL — ABNORMAL HIGH (ref 70–99)
Glucose-Capillary: 245 mg/dL — ABNORMAL HIGH (ref 70–99)
Glucose-Capillary: 295 mg/dL — ABNORMAL HIGH (ref 70–99)
Glucose-Capillary: 252 mg/dL — ABNORMAL HIGH (ref 70–99)

## 2019-11-24 LAB — ECHOCARDIOGRAM COMPLETE
Height: 62 in
Weight: 2880 oz

## 2019-11-24 LAB — TROPONIN I (HIGH SENSITIVITY): Troponin I (High Sensitivity): 10158 ng/L (ref <18)

## 2019-11-24 LAB — HEPARIN LEVEL (UNFRACTIONATED): Heparin Unfractionated: 0.47 IU/mL (ref 0.30–0.70)

## 2019-11-24 MED ORDER — PERFLUTREN LIPID MICROSPHERE
1.0000 mL | INTRAVENOUS | Status: AC | PRN
  Administered 2019-11-24: 15:00:00 3 mL via INTRAVENOUS
  Filled 2019-11-24: qty 10

## 2019-11-24 MED ORDER — NICOTINE 14 MG/24HR TD PT24
14.0000 mg | MEDICATED_PATCH | Freq: Every day | TRANSDERMAL | Status: DC
  Administered 2019-11-24 – 2019-11-28 (×5): 14 mg via TRANSDERMAL
  Filled 2019-11-24 (×5): qty 1

## 2019-11-24 NOTE — Progress Notes (Addendum)
Progress Note  Patient Name: Maria Harper Date of Encounter: 11/24/2019  Primary Cardiologist: Ellyn Hack  Subjective   Continues to have mild pressure-like angina, but is complaining more of a sharp stabbing pain, worsened by deep breathing, which could represent pericarditis. Denies dyspnea and palpitations.  Inpatient Medications    Scheduled Meds: . aspirin  81 mg Oral Daily  . atorvastatin  80 mg Oral q1800  . Chlorhexidine Gluconate Cloth  6 each Topical Daily  . cloNIDine  0.1 mg Oral BID  . gabapentin  300 mg Oral TID  . insulin aspart  0-20 Units Subcutaneous TID WC  . insulin aspart protamine- aspart  30 Units Subcutaneous BID WC  . metoprolol tartrate  50 mg Oral BID  . montelukast  10 mg Oral QHS  . nicotine  14 mg Transdermal Daily  . pantoprazole  40 mg Oral BID  . QUEtiapine  200 mg Oral BH-q7a  . QUEtiapine  400 mg Oral QHS  . sodium chloride flush  3 mL Intravenous Q12H  . ticagrelor  90 mg Oral BID   Continuous Infusions: . sodium chloride    . heparin 1,000 Units/hr (11/23/19 2205)  . nitroGLYCERIN 10 mcg/min (11/23/19 1800)   PRN Meds: sodium chloride, acetaminophen, cyclobenzaprine, morphine injection, nitroGLYCERIN, ondansetron (ZOFRAN) IV, polyethylene glycol powder, sodium chloride flush   Vital Signs    Vitals:   11/24/19 0800 11/24/19 0805 11/24/19 0830 11/24/19 0930  BP:  117/65 126/72 106/75  Pulse: 96 92 90   Resp: 16 18 16  (!) 24  Temp:      TempSrc:      SpO2: 98% 100% 98%   Weight:      Height:        Intake/Output Summary (Last 24 hours) at 11/24/2019 1031 Last data filed at 11/24/2019 1000 Gross per 24 hour  Intake 1333.36 ml  Output 2300 ml  Net -966.64 ml   Last 3 Weights 11/23/2019 09/19/2019 02/17/2019  Weight (lbs) 180 lb 180 lb 164 lb  Weight (kg) 81.647 kg 81.647 kg 74.39 kg      Telemetry    Sinus rhythm- Personally Reviewed  ECG    Sinus rhythm, left axis deviation, subtle residual ST segment elevation  V1-V3, T wave inversion across the anterolateral leads- Personally Reviewed  Physical Exam  Appears comfortable sitting up in chair GEN: No acute distress.   Neck: No JVD Cardiac: RRR, faint systolic murmur left upper sternal border, no diastolic murmurs, rubs, or gallops.  Respiratory: Clear to auscultation bilaterally. GI: Soft, nontender, non-distended  MS: No edema; No deformity. Neuro:  Nonfocal  Psych: Normal affect   Labs    High Sensitivity Troponin:   Recent Labs  Lab 11/23/19 1113 11/23/19 1737 11/23/19 1915 11/23/19 2159 11/23/19 2329  TROPONINIHS 24* 4,773* 5,913* 9,370* 10,158*      Chemistry Recent Labs  Lab 11/23/19 1107 11/23/19 1113 11/24/19 0505  NA 138 136 133*  K 3.8 3.8 3.9  CL 108 107 101  CO2  --  18* 20*  GLUCOSE 213* 212* 349*  BUN 20 18 21*  CREATININE 1.00 1.04* 1.25*  CALCIUM  --  9.0 9.1  PROT  --  6.9  --   ALBUMIN  --  3.6  --   AST  --  16  --   ALT  --  19  --   ALKPHOS  --  74  --   BILITOT  --  0.4  --   GFRNONAA  --  >  60 52*  GFRAA  --  >60 60*  ANIONGAP  --  11 12     Hematology Recent Labs  Lab 11/23/19 1107 11/23/19 1113 11/24/19 0505  WBC  --  8.9 11.2*  RBC  --  3.95 3.72*  HGB 11.9* 11.7* 10.9*  HCT 35.0* 35.9* 33.5*  MCV  --  90.9 90.1  MCH  --  29.6 29.3  MCHC  --  32.6 32.5  RDW  --  21.7* 21.7*  PLT  --  251 259    BNPNo results for input(s): BNP, PROBNP in the last 168 hours.   DDimer No results for input(s): DDIMER in the last 168 hours.   Radiology    CARDIAC CATHETERIZATION  Result Date: 11/23/2019  Previously placed Mid LAD to Dist LAD stent (unknown type) is widely patent.  Mid LAD lesion is 30% stenosed.  CULPRIT LESION: Dist LAD-1 lesion is 100% stenosed with 100% stenosed side branch in 3rd Diag.  Unsuccessful balloon angioplasty was performed using a BALLOON SAPPHIRE 2.0X12 -unable to advance into the distal vessel.  Post intervention, there is a 70% residual stenosis followed by  Dist LAD-2 lesion is 99% stenosed. Post intervention, the side branch was remained at 100% residual stenosis.  --------------------------  RPDA lesion is 80% stenosed-tapers into very small vessel..  LV end diastolic pressure is severely elevated. Therefore no the ventriculogram was performed.  There is no aortic valve stenosis.     Cardiac Studies   Reviewed echo from [facility] and coronary angiograms from yesterday  Patient Profile     49 y.o. female with a limited anteroapical STEMI related to distal occlusion of the LAD artery and diagonal branch, with unsuccessful attempt at revascularization.  Patent previously placed mid LAD stent and 80% stenosis of small RPDA.  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  Assessment & Plan    1. CAD s/p apical STEMI: Still has some angina, on intravenous nitroglycerin, but increasingly has symptoms suggestive of postinfarction pericarditis (mild).  No major ST segment elevation or depression is seen currently and her troponin seems to have plateaued at about 10,000.  Preserved LVEF, no signs of congestive heart failure.  Plan 12 months of dual antiplatelet therapy, high-dose atorvastatin, beta-blockers.  May require additional antianginal medications due to residual viable tissue in the distal LAD distribution and due to the high-grade stenosis in the PDA. 2. HLP: LDL cholesterol was 121 on admission, I am worried about noncompliance with his chronic statin therapy.  Previous baseline unknown. 3.  Smoking: Discussed the need for smoking cessation at length.  We will provide a nicotine patch. 4. DM: Poorly controlled with hemoglobin A1c of 9.6%, although this is a big improvement from February when it was 13.7%. 5. HTN: Controlled on current medical regimen. 6.  History of cocaine abuse: Strongly advised against future use, due to the potential for lethal complications.  For questions or  updates, please contact [facility] Please consult [email or web address] for contact info under        Signed, Thurmon Fair, MD  11/24/2019, 10:31 AM

## 2019-11-24 NOTE — Progress Notes (Signed)
  Echocardiogram 2D Echocardiogram has been performed.  Stark Bray Swaim 11/24/2019, 2:42 PM

## 2019-11-24 NOTE — Plan of Care (Signed)
  Problem: Education: Goal: Understanding of cardiac disease, CV risk reduction, and recovery process will improve Outcome: Progressing Goal: Individualized Educational Video(s) Outcome: Progressing   Problem: Activity: Goal: Ability to tolerate increased activity will improve Outcome: Progressing   Problem: Cardiac: Goal: Ability to achieve and maintain adequate cardiopulmonary perfusion will improve Outcome: Progressing Goal: Vascular access site(s) Level 0-1 will be maintained Outcome: Completed/Met

## 2019-11-24 NOTE — Progress Notes (Signed)
CARDIAC REHAB PHASE I   PRE:  Rate/Rhythm: 90 SR  BP:  Supine: 106/63 Sitting:   Standing:    SaO2: 985 2L O2  MODE:  Ambulation: 190 ft   POST:  Rate/Rhythm: 94 SR  BP:  Supine: 131/75 Sitting:   Standing:    SaO2: 96% RA  1318-1413 Patient ambulated 190 ft with assist x1 and patient's husband walking on there side, gait slow, steady, two standing rest breaks taken. Patient c/o chest pain at the end of walk, informed patient's RN. Assisted patient to bathroom then back to bed after walk, IV intact, call bell within reach left on 2L O2 in room. Began MI education with patient and patient's husband. Reviewed activity progression and tobacco cessation. Patient verbalizes understanding. Began discussion of phase 2 cardiac rehab, and referral placed for the program at [facility]. Echo tech arrived for echocardiogram, will complete education on Monday.  Artist Pais, MS, ACSM CEP

## 2019-11-24 NOTE — Progress Notes (Signed)
ANTICOAGULATION CONSULT NOTE   Pharmacy Consult for heparin  Indication: chest pain/ACS  Allergies  Allergen Reactions  . Contrast Media [Iodinated Diagnostic Agents] Hives and Itching  . Tomato Itching and Rash    Patient Measurements: Height: 5\' 2"  (157.5 cm) Weight: 81.6 kg (180 lb) IBW/kg (Calculated) : 50.1 Heparin Dosing Weight: 68kg  Vital Signs: Temp: 98.2 F (36.8 C) (04/24 0339) Temp Source: Oral (04/24 0339) BP: 113/62 (04/24 0500) Pulse Rate: 95 (04/24 0500)  Labs: Recent Labs    11/23/19 1107 11/23/19 1107 11/23/19 1113 11/23/19 1737 11/23/19 1915 11/23/19 2159 11/23/19 2329 11/24/19 0505  HGB 11.9*   < > 11.7*  --   --   --   --  10.9*  HCT 35.0*  --  35.9*  --   --   --   --  33.5*  PLT  --   --  251  --   --   --   --  259  APTT  --   --  >200*  --   --   --   --   --   LABPROT  --   --  14.6  --   --   --   --   --   INR  --   --  1.2  --   --   --   --   --   HEPARINUNFRC  --   --   --   --   --   --   --  0.47  CREATININE 1.00  --  1.04*  --   --   --   --  1.25*  TROPONINIHS  --   --  24*   < > 5,913* 9,370* 10,158*  --    < > = values in this interval not displayed.    Estimated Creatinine Clearance: 55.7 mL/min (A) (by C-G formula based on SCr of 1.25 mg/dL (H)).  Assessment: 49 y.o. female with STEMI s/p cath and unsuccessful angioplasty, for heparin  Goal of Therapy:  Heparin level 0.3-0.5 units/ml Monitor platelets by anticoagulation protocol: Yes   Plan:  Continue Heparin at current rate   51, PharmD, BCPS  11/24/2019 6:36 AM

## 2019-11-25 DIAGNOSIS — E118 Type 2 diabetes mellitus with unspecified complications: Secondary | ICD-10-CM | POA: Diagnosis present

## 2019-11-25 DIAGNOSIS — E1149 Type 2 diabetes mellitus with other diabetic neurological complication: Secondary | ICD-10-CM | POA: Diagnosis present

## 2019-11-25 DIAGNOSIS — Z794 Long term (current) use of insulin: Secondary | ICD-10-CM | POA: Diagnosis present

## 2019-11-25 LAB — URINALYSIS, ROUTINE W REFLEX MICROSCOPIC
Bilirubin Urine: NEGATIVE
Glucose, UA: 50 mg/dL — AB
Ketones, ur: NEGATIVE mg/dL
Nitrite: NEGATIVE
Protein, ur: 100 mg/dL — AB
Specific Gravity, Urine: 1.019 (ref 1.005–1.030)
pH: 5 (ref 5.0–8.0)

## 2019-11-25 LAB — CBC
HCT: 34.5 % — ABNORMAL LOW (ref 36.0–46.0)
Hemoglobin: 11.2 g/dL — ABNORMAL LOW (ref 12.0–15.0)
MCH: 29.5 pg (ref 26.0–34.0)
MCHC: 32.5 g/dL (ref 30.0–36.0)
MCV: 90.8 fL (ref 80.0–100.0)
Platelets: 229 10*3/uL (ref 150–400)
RBC: 3.8 MIL/uL — ABNORMAL LOW (ref 3.87–5.11)
RDW: 21.7 % — ABNORMAL HIGH (ref 11.5–15.5)
WBC: 9.5 10*3/uL (ref 4.0–10.5)
nRBC: 0 % (ref 0.0–0.2)

## 2019-11-25 LAB — MRSA PCR SCREENING: MRSA by PCR: NEGATIVE

## 2019-11-25 LAB — GLUCOSE, CAPILLARY
Glucose-Capillary: 292 mg/dL — ABNORMAL HIGH (ref 70–99)
Glucose-Capillary: 283 mg/dL — ABNORMAL HIGH (ref 70–99)
Glucose-Capillary: 197 mg/dL — ABNORMAL HIGH (ref 70–99)
Glucose-Capillary: 300 mg/dL — ABNORMAL HIGH (ref 70–99)

## 2019-11-25 LAB — HEPARIN LEVEL (UNFRACTIONATED): Heparin Unfractionated: 0.39 IU/mL (ref 0.30–0.70)

## 2019-11-25 MED ORDER — INSULIN ASPART 100 UNIT/ML ~~LOC~~ SOLN: 12.0000 [IU] | Freq: Once | SUBCUTANEOUS | Status: DC

## 2019-11-25 MED ORDER — CARVEDILOL 6.25 MG PO TABS
6.2500 mg | ORAL_TABLET | Freq: Two times a day (BID) | ORAL | Status: DC
  Administered 2019-11-25 – 2019-11-27 (×5): 6.25 mg via ORAL
  Filled 2019-11-25 (×5): qty 1

## 2019-11-25 MED ORDER — CEPHALEXIN 500 MG PO CAPS
500.0000 mg | ORAL_CAPSULE | Freq: Four times a day (QID) | ORAL | Status: DC
  Administered 2019-11-25 – 2019-11-28 (×13): 500 mg via ORAL
  Filled 2019-11-25 (×17): qty 1

## 2019-11-25 MED ORDER — INSULIN ASPART PROT & ASPART (70-30 MIX) 100 UNIT/ML ~~LOC~~ SUSP
40.0000 [IU] | Freq: Two times a day (BID) | SUBCUTANEOUS | Status: DC
  Administered 2019-11-26 – 2019-11-27 (×2): 40 [IU] via SUBCUTANEOUS
  Filled 2019-11-25: qty 10

## 2019-11-25 MED ORDER — CIPROFLOXACIN HCL 500 MG PO TABS: 500.0000 mg | ORAL_TABLET | Freq: Two times a day (BID) | ORAL | Status: DC

## 2019-11-25 MED ORDER — INSULIN ASPART 100 UNIT/ML ~~LOC~~ SOLN
10.0000 [IU] | Freq: Once | SUBCUTANEOUS | Status: AC
  Administered 2019-11-25: 10 [IU] via SUBCUTANEOUS

## 2019-11-25 MED ORDER — ISOSORBIDE MONONITRATE ER 30 MG PO TB24
30.0000 mg | ORAL_TABLET | Freq: Every day | ORAL | Status: DC
  Administered 2019-11-25 – 2019-11-28 (×4): 30 mg via ORAL
  Filled 2019-11-25 (×4): qty 1

## 2019-11-25 NOTE — Progress Notes (Signed)
ANTICOAGULATION CONSULT NOTE   Pharmacy Consult for heparin  Indication: chest pain/ACS  Allergies  Allergen Reactions  . Contrast Media [Iodinated Diagnostic Agents] Hives and Itching  . Tomato Itching and Rash    Patient Measurements: Height: 5\' 2"  (157.5 cm) Weight: 81.6 kg (180 lb) IBW/kg (Calculated) : 50.1 Heparin Dosing Weight: 68kg  Vital Signs: Temp: 98.5 F (36.9 C) (04/25 0418) Temp Source: Oral (04/25 0418) BP: 94/67 (04/25 0600) Pulse Rate: 82 (04/25 0700)  Labs: Recent Labs    11/23/19 1107 11/23/19 1107 11/23/19 1113 11/23/19 1113 11/23/19 1737 11/23/19 1915 11/23/19 2159 11/23/19 2329 11/24/19 0505 11/25/19 0328  HGB 11.9*   < > 11.7*   < >  --   --   --   --  10.9* 11.2*  HCT 35.0*   < > 35.9*  --   --   --   --   --  33.5* 34.5*  PLT  --   --  251  --   --   --   --   --  259 229  APTT  --   --  >200*  --   --   --   --   --   --   --   LABPROT  --   --  14.6  --   --   --   --   --   --   --   INR  --   --  1.2  --   --   --   --   --   --   --   HEPARINUNFRC  --   --   --   --   --   --   --   --  0.47  --   CREATININE 1.00  --  1.04*  --   --   --   --   --  1.25*  --   TROPONINIHS  --   --  24*  --    < > 5,913* 9,370* 10,158*  --   --    < > = values in this interval not displayed.    Estimated Creatinine Clearance: 55.7 mL/min (A) (by C-G formula based on SCr of 1.25 mg/dL (H)).  Assessment: 49 y.o. female with STEMI s/p cath and unsuccessful angioplasty, for heparin  Heparin level is therapeutic at 0.39  CBC stable, no bleeding noted  Goal of Therapy:  Heparin level 0.3-0.5 units/ml Monitor platelets by anticoagulation protocol: Yes   Plan:  Continue heparin at 1000 units/hr Daily heparin level and CBC Follow up with cardiology regarding LOT for heparin  51, PharmD, [facility] PGY2 Cardiology Pharmacy Resident Phone [phone] 11/25/2019       7:28 AM  Please check [email or web address] for unit-specific pharmacist phone  numbers

## 2019-11-25 NOTE — Progress Notes (Signed)
Progress Note  Patient Name: Maria Harper Date of Encounter: 11/25/2019  Primary Cardiologist: Herbie Baltimore  Subjective   Off IV NTG. Still with mild persistent chest soreness. Slight tenderness at R forearm, but no hematoma or ecchymosis, excellent pulse. Echo shows an expected apical wall motion abnormality, EF 45-50%. Denies dyspnea. No arrhythmia.  Inpatient Medications    Scheduled Meds: . aspirin  81 mg Oral Daily  . atorvastatin  80 mg Oral q1800  . Chlorhexidine Gluconate Cloth  6 each Topical Daily  . cloNIDine  0.1 mg Oral BID  . gabapentin  300 mg Oral TID  . insulin aspart  0-20 Units Subcutaneous TID WC  . insulin aspart protamine- aspart  30 Units Subcutaneous BID WC  . metoprolol tartrate  50 mg Oral BID  . montelukast  10 mg Oral QHS  . nicotine  14 mg Transdermal Daily  . pantoprazole  40 mg Oral BID  . QUEtiapine  200 mg Oral BH-q7a  . QUEtiapine  400 mg Oral QHS  . sodium chloride flush  3 mL Intravenous Q12H  . ticagrelor  90 mg Oral BID   Continuous Infusions: . sodium chloride    . heparin 1,000 Units/hr (11/25/19 0700)  . nitroGLYCERIN Stopped (11/24/19 1733)   PRN Meds: sodium chloride, acetaminophen, cyclobenzaprine, morphine injection, nitroGLYCERIN, ondansetron (ZOFRAN) IV, polyethylene glycol powder, sodium chloride flush   Vital Signs    Vitals:   11/25/19 0500 11/25/19 0600 11/25/19 0700 11/25/19 0755  BP: 123/65 94/67    Pulse: 80 80 82   Resp: 19 15 14    Temp:    (!) 97.4 F (36.3 C)  TempSrc:      SpO2: 97% 98% 97%   Weight:      Height:        Intake/Output Summary (Last 24 hours) at 11/25/2019 0825 Last data filed at 11/25/2019 0700 Gross per 24 hour  Intake 502.45 ml  Output 200 ml  Net 302.45 ml   Last 3 Weights 11/23/2019 09/19/2019 02/17/2019  Weight (lbs) 180 lb 180 lb 164 lb  Weight (kg) 81.647 kg 81.647 kg 74.39 kg      Telemetry    NSR - Personally Reviewed  ECG    No new tracing - Personally  Reviewed  Physical Exam  Lying fully supine, appears comfortable GEN: No acute distress.   Neck: No JVD Cardiac: RRR, no murmurs, rubs, or gallops.  Respiratory: Clear to auscultation bilaterally. GI: Soft, nontender, non-distended  MS: No edema; No deformity. Neuro:  Nonfocal  Psych: Normal affect   Labs    High Sensitivity Troponin:   Recent Labs  Lab 11/23/19 1113 11/23/19 1737 11/23/19 1915 11/23/19 2159 11/23/19 2329  TROPONINIHS 24* 4,773* 5,913* 9,370* 10,158*      Chemistry Recent Labs  Lab 11/23/19 1107 11/23/19 1113 11/24/19 0505  NA 138 136 133*  K 3.8 3.8 3.9  CL 108 107 101  CO2  --  18* 20*  GLUCOSE 213* 212* 349*  BUN 20 18 21*  CREATININE 1.00 1.04* 1.25*  CALCIUM  --  9.0 9.1  PROT  --  6.9  --   ALBUMIN  --  3.6  --   AST  --  16  --   ALT  --  19  --   ALKPHOS  --  74  --   BILITOT  --  0.4  --   GFRNONAA  --  >60 52*  GFRAA  --  >60 60*  ANIONGAP  --  11 12     Hematology Recent Labs  Lab 11/23/19 1113 11/24/19 0505 11/25/19 0328  WBC 8.9 11.2* 9.5  RBC 3.95 3.72* 3.80*  HGB 11.7* 10.9* 11.2*  HCT 35.9* 33.5* 34.5*  MCV 90.9 90.1 90.8  MCH 29.6 29.3 29.5  MCHC 32.6 32.5 32.5  RDW 21.7* 21.7* 21.7*  PLT 251 259 229    BNPNo results for input(s): BNP, PROBNP in the last 168 hours.   DDimer No results for input(s): DDIMER in the last 168 hours.   Radiology    CARDIAC CATHETERIZATION  Result Date: 11/23/2019  Previously placed Mid LAD to Dist LAD stent (unknown type) is widely patent.  Mid LAD lesion is 30% stenosed.  CULPRIT LESION: Dist LAD-1 lesion is 100% stenosed with 100% stenosed side branch in 3rd Diag.  Unsuccessful balloon angioplasty was performed using a BALLOON SAPPHIRE 2.0X12 -unable to advance into the distal vessel.  Post intervention, there is a 70% residual stenosis followed by Dist LAD-2 lesion is 99% stenosed. Post intervention, the side branch was remained at 100% residual stenosis.   --------------------------  RPDA lesion is 80% stenosed-tapers into very small vessel..  LV end diastolic pressure is severely elevated. Therefore no the ventriculogram was performed.  There is no aortic valve stenosis.    ECHOCARDIOGRAM COMPLETE  Result Date: 11/24/2019    ECHOCARDIOGRAM REPORT   Patient Name:   Maria Harper Date of Exam: 11/24/2019 Medical Rec #:  [id number]        Height:       62.0 in Accession #:    [id number]       Weight:       180.0 lb Date of Birth:  Sep 10, 1970        BSA:          1.828 m Patient Age:    49 years         BP:           123/72 mmHg Patient Gender: F                HR:           82 bpm. Exam Location:  Inpatient Procedure: 2D Echo, Cardiac Doppler, Color Doppler and Intracardiac            Opacification Agent Indications:    CAD Native Vessel 414.01 / I25.10  History:        Patient has no prior history of Echocardiogram examinations.                 CAD, Arrythmias:STEMI; Risk Factors:Current Smoker.  Sonographer:    Renella Cunas RDCS Referring Phys: [id number] DAVID W HARDING IMPRESSIONS  1. Left ventricular ejection fraction, by estimation, is 45 to 50%. The left ventricle has mildly decreased function. The left ventricle has no regional wall motion abnormalities. Left ventricular diastolic parameters are consistent with Grade I diastolic dysfunction (impaired relaxation). Elevated left atrial pressure.  2. Right ventricular systolic function is normal. The right ventricular size is normal. There is normal pulmonary artery systolic pressure.  3. The mitral valve is normal in structure. No evidence of mitral valve regurgitation. No evidence of mitral stenosis.  4. The aortic valve is tricuspid. Aortic valve regurgitation is moderate to severe. Mild to moderate aortic valve sclerosis/calcification is present, without any evidence of aortic stenosis.  5. The inferior vena cava is normal in size with greater than 50% respiratory variability, suggesting right atrial pressure  of 3 mmHg. FINDINGS  Left  Ventricle: Left ventricular ejection fraction, by estimation, is 45 to 50%. The left ventricle has mildly decreased function. The left ventricle has no regional wall motion abnormalities. Definity contrast agent was given IV to delineate the left ventricular endocardial borders. The left ventricular internal cavity size was normal in size. There is no left ventricular hypertrophy. Left ventricular diastolic parameters are consistent with Grade I diastolic dysfunction (impaired relaxation). Elevated left atrial pressure.  LV Wall Scoring: The apical septal segment, apical inferior segment, and apex are akinetic. Right Ventricle: The right ventricular size is normal. No increase in right ventricular wall thickness. Right ventricular systolic function is normal. There is normal pulmonary artery systolic pressure. The tricuspid regurgitant velocity is 2.15 m/s, and  with an assumed right atrial pressure of 8 mmHg, the estimated right ventricular systolic pressure is 26.5 mmHg. Left Atrium: Left atrial size was normal in size. Right Atrium: Right atrial size was normal in size. Pericardium: There is no evidence of pericardial effusion. Mitral Valve: The mitral valve is normal in structure. Normal mobility of the mitral valve leaflets. No evidence of mitral valve regurgitation. No evidence of mitral valve stenosis. Tricuspid Valve: The tricuspid valve is normal in structure. Tricuspid valve regurgitation is trivial. No evidence of tricuspid stenosis. Aortic Valve: The aortic valve is tricuspid. Aortic valve regurgitation is moderate to severe. Aortic regurgitation PHT measures 584 msec. Mild to moderate aortic valve sclerosis/calcification is present, without any evidence of aortic stenosis. Pulmonic Valve: The pulmonic valve was normal in structure. Pulmonic valve regurgitation is not visualized. No evidence of pulmonic stenosis. Aorta: The aortic root is normal in size and structure. Venous:  The inferior vena cava is normal in size with greater than 50% respiratory variability, suggesting right atrial pressure of 3 mmHg. IAS/Shunts: No atrial level shunt detected by color flow Doppler.  LEFT VENTRICLE PLAX 2D LVIDd:         4.40 cm      Diastology LVIDs:         3.40 cm      LV e' lateral:   3.49 cm/s LV PW:         1.00 cm      LV E/e' lateral: 26.0 LV IVS:        0.80 cm      LV e' medial:    4.05 cm/s LVOT diam:     1.70 cm      LV E/e' medial:  22.4 LV SV:         39 LV SV Index:   22 LVOT Area:     2.27 cm  LV Volumes (MOD) LV vol d, MOD A2C: 116.0 ml LV vol d, MOD A4C: 159.0 ml LV vol s, MOD A2C: 67.6 ml LV vol s, MOD A4C: 89.5 ml LV SV MOD A2C:     48.4 ml LV SV MOD A4C:     159.0 ml LV SV MOD BP:      58.1 ml RIGHT VENTRICLE RV S prime:     12.60 cm/s TAPSE (M-mode): 1.7 cm LEFT ATRIUM             Index       RIGHT ATRIUM          Index LA diam:        3.80 cm 2.08 cm/m  RA Area:     9.23 cm LA Vol (A2C):   25.8 ml 14.11 ml/m RA Volume:   15.40 ml 8.42 ml/m LA Vol (A4C):  32.9 ml 18.00 ml/m LA Biplane Vol: 29.2 ml 15.97 ml/m  AORTIC VALVE LVOT Vmax:   94.60 cm/s LVOT Vmean:  60.000 cm/s LVOT VTI:    0.174 m AI PHT:      584 msec  AORTA Ao Root diam: 2.50 cm MITRAL VALVE                TRICUSPID VALVE MV Area (PHT): 3.65 cm     TR Peak grad:   18.5 mmHg MV Decel Time: 208 msec     TR Vmax:        215.00 cm/s MV E velocity: 90.80 cm/s MV A velocity: 106.00 cm/s  SHUNTS MV E/A ratio:  0.86         Systemic VTI:  0.17 m                             Systemic Diam: 1.70 cm Donato Schultz MD Electronically signed by Donato Schultz MD Signature Date/Time: 11/24/2019/3:11:49 PM    Final     Cardiac Studies   ECHO 11/24/2019 1. Left ventricular ejection fraction, by estimation, is 45 to 50%. The  left ventricle has mildly decreased function. ..   LV Wall Scoring:  The apical septal segment, apical inferior segment, and apex are akinetic.    ... Left ventricular diastolic parameters  are  consistent with Grade I  diastolic dysfunction (impaired relaxation). Elevated left atrial  pressure.  2. Right ventricular systolic function is normal. The right ventricular  size is normal. There is normal pulmonary artery systolic pressure.  3. The mitral valve is normal in structure. No evidence of mitral valve  regurgitation. No evidence of mitral stenosis.  4. The aortic valve is tricuspid. Aortic valve regurgitation is moderate  to severe. Mild to moderate aortic valve sclerosis/calcification is  present, without any evidence of aortic stenosis.  5. The inferior vena cava is normal in size with greater than 50%  respiratory variability, suggesting right atrial pressure of 3 mmHg.   Patient Profile     49 y.o. female with a limited anteroapical STEMI related to distal occlusion of the LAD artery and diagonal branch, with unsuccessful attempt at revascularization.  Has previously placed mid LAD stent and 80% stenosis of small RPDA.  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  Assessment & Plan    1. CAD s/p apical STEMI: Mild residual discomfort, not clear if angina or pericarditis (not breathing related today). Off IV NTG. Will start oral nitrates. Preserved LVEF, no signs of congestive heart failure.  Plan 12 months of dual antiplatelet therapy, high-dose atorvastatin, beta-blockers.  Will probably require additional antianginal medication titration due to residual viable tissue in the distal LAD distribution and due to the high-grade stenosis in the PDA. 2. HLP: LDL cholesterol was 121 on admission, I am worried about noncompliance with chronic statin therapy.  Previous baseline unknown. Reinforce need for compliance with lipid lowering meds and lifestyle changes. 3.  Smoking: Discussed the need for smoking cessation at length.  Now on nicotine patch. 4. DM: Poorly controlled with hemoglobin A1c of 9.6%, although  this is a big improvement from February when it was 13.7%. 5. HTN: Controlled on current medical regimen, but will switch to carvedilol for better antianginal effect, low EF (and potentially more favorable as alpha blocker if there is a cocaine use relapse). 6.  History of cocaine abuse: Strongly advised  against future use, due to the potential for lethal complications.  Transfer telemetry. Potential DC in AM if symptoms are reasonably well controlled.  For questions or updates, please contact [place] Please consult [email or web address] for contact info under        Signed, Sanda Klein, MD  11/25/2019, 8:26 AM

## 2019-11-25 NOTE — Plan of Care (Signed)
Glucose persistently over 200 today, 300 currently tonight. Currently on 30 units 70/30 BID plus sliding scale. Has received over 20 units correctional already today and giving additional 10 units now. Will increase 70/30 insulin to 40 units BID starting tomorrow morning.

## 2019-11-25 NOTE — Progress Notes (Addendum)
Pt received from 2 H @ 12 mid day, vitals stable, pt is alert times 4, pt complaints of chest pain is covered with morphine throughout the time, IV heparin continue @1000units /hr, pt denies other complaints, will continue to monitor the patient  , RN

## 2019-11-26 ENCOUNTER — Other Ambulatory Visit: Payer: Self-pay

## 2019-11-26 DIAGNOSIS — E1169 Type 2 diabetes mellitus with other specified complication: Secondary | ICD-10-CM

## 2019-11-26 DIAGNOSIS — Z720 Tobacco use: Secondary | ICD-10-CM

## 2019-11-26 DIAGNOSIS — E785 Hyperlipidemia, unspecified: Secondary | ICD-10-CM

## 2019-11-26 DIAGNOSIS — E1149 Type 2 diabetes mellitus with other diabetic neurological complication: Secondary | ICD-10-CM

## 2019-11-26 LAB — GLUCOSE, CAPILLARY
Glucose-Capillary: 371 mg/dL — ABNORMAL HIGH (ref 70–99)
Glucose-Capillary: 185 mg/dL — ABNORMAL HIGH (ref 70–99)
Glucose-Capillary: 132 mg/dL — ABNORMAL HIGH (ref 70–99)
Glucose-Capillary: 229 mg/dL — ABNORMAL HIGH (ref 70–99)

## 2019-11-26 LAB — CBC
HCT: 33.1 % — ABNORMAL LOW (ref 36.0–46.0)
Hemoglobin: 10.6 g/dL — ABNORMAL LOW (ref 12.0–15.0)
MCH: 29.1 pg (ref 26.0–34.0)
MCHC: 32 g/dL (ref 30.0–36.0)
MCV: 90.9 fL (ref 80.0–100.0)
Platelets: 228 10*3/uL (ref 150–400)
RBC: 3.64 MIL/uL — ABNORMAL LOW (ref 3.87–5.11)
RDW: 21.5 % — ABNORMAL HIGH (ref 11.5–15.5)
WBC: 8.7 10*3/uL (ref 4.0–10.5)
nRBC: 0 % (ref 0.0–0.2)

## 2019-11-26 LAB — HEPARIN LEVEL (UNFRACTIONATED): Heparin Unfractionated: 0.29 IU/mL — ABNORMAL LOW (ref 0.30–0.70)

## 2019-11-26 MED ORDER — LOSARTAN POTASSIUM 25 MG PO TABS
25.0000 mg | ORAL_TABLET | Freq: Every day | ORAL | Status: DC
  Administered 2019-11-26 – 2019-11-28 (×3): 25 mg via ORAL
  Filled 2019-11-26 (×3): qty 1

## 2019-11-26 NOTE — Progress Notes (Signed)
CARDIAC REHAB PHASE I   PRE:  Rate/Rhythm: 105 ST    BP: sitting 128/83    SaO2:   MODE:  Ambulation: 230 ft   POST:  Rate/Rhythm: 108 ST    BP: sitting 143/83     SaO2:   Pt just finished bathing, c/o nausea and HA. Able to walk at slow pace, denies CP. HR elevated, no real change with ambulation. To recliner. Began education however EKG came. Will f/u later for more education. [id number]   Harriet Masson CES, ACSM 11/26/2019 9:12 AM

## 2019-11-26 NOTE — Plan of Care (Signed)
  Problem: Education: Goal: Understanding of cardiac disease, CV risk reduction, and recovery process will improve Outcome: Progressing Goal: Understanding of medication regimen will improve Outcome: Progressing Goal: Individualized Educational Video(s) Outcome: Progressing   Problem: Activity: Goal: Ability to tolerate increased activity will improve Outcome: Progressing   Problem: Cardiac: Goal: Ability to achieve and maintain adequate cardiopulmonary perfusion will improve Outcome: Progressing   Problem: Health Behavior/Discharge Planning: Goal: Ability to safely manage health-related needs after discharge will improve Outcome: Progressing   

## 2019-11-26 NOTE — TOC Initial Note (Addendum)
Transition of Care [facility]) - Initial/Assessment Note    Patient Details  Name: Maria Harper MRN: [id number] Date of Birth: July 20, 1971  [facility] CM/SW Contact:    Leone Haven, RN Phone Number: 11/26/2019, 1:36 PM  Clinical Narrative:                 Patient from home with spouse, she states she goes to [facility] dept for her medications and she usually get those free.  She states her spouse will transport her home at dc.   If she is started on something new, she may need ast with that medication because this will be something that they will not have at [facility]. Brilinta will be a new medication for patient. NCM spoke with patient PCP, Pennie Banter, she will help assist patient with patient ast application for the brilinta.  Will need the [facility] pharmacy to fill first 30 day free, she will get the rest of meds at [facility] health dept.  Expected Discharge Plan: Home/Self Care Barriers to Discharge: Continued Medical Work up   Patient Goals and CMS Choice Patient states their goals for this hospitalization and ongoing recovery are:: get better   Choice offered to / list presented to : NA  Expected Discharge Plan and Services Expected Discharge Plan: Home/Self Care In-house Referral: NA Discharge Planning Services: CM Consult Post Acute Care Choice: NA Living arrangements for the past 2 months: Single Family Home                   DME Agency: NA       HH Arranged: NA          Prior Living Arrangements/Services Living arrangements for the past 2 months: Single Family Home Lives with:: Spouse Patient language and need for interpreter reviewed:: Yes Do you feel safe going back to the place where you live?: Yes      Need for Family Participation in Patient Care: Yes (Comment) Care giver support system in place?: Yes (comment)   Criminal Activity/Legal Involvement Pertinent to Current Situation/Hospitalization: No - Comment as  needed  Activities of Daily Living      Permission Sought/Granted                  Emotional Assessment   Attitude/Demeanor/Rapport: Engaged Affect (typically observed): Appropriate Orientation: : Oriented to Self, Oriented to Place, Oriented to  Time, Oriented to Situation Alcohol / Substance Use: Illicit Drugs Psych Involvement: No (comment)  Admission diagnosis:  ST elevation myocardial infarction (STEMI), unspecified artery (HCC) [I21.3] Chest pain, unspecified type [R07.9] Acute ST elevation myocardial infarction (STEMI) of anteroseptal wall (HCC) [I21.09] Patient Active Problem List   Diagnosis Date Noted  . Hyperlipidemia with target LDL less than 70   . Tobacco abuse   . Type II diabetes mellitus with neurological manifestations, uncontrolled (HCC) 11/25/2019  . Acute ST elevation myocardial infarction (STEMI) of anterior wall (HCC) 11/23/2019  . CAD S/P percutaneous coronary angioplasty 11/23/2019  . Acute ST elevation myocardial infarction (STEMI) of anteroseptal wall (HCC) 11/23/2019  . Sepsis (HCC) 09/19/2019  . Cocaine abuse with cocaine-induced mood disorder (HCC) 06/10/2017   PCP:  Lavinia Sharps, NP Pharmacy:   [facility] [address] ([address]), [place] - [address] [place] ([place]) [place] [address] Phone: [phone] Fax: [phone]  [facility] [place], [place] - [address] [place] [address] Phone:  [phone] Fax: [facility] [address] [place] - [place] [address] Phone: [phone] Fax: [phone]     Social Determinants of Health (SDOH) Interventions    Readmission Risk Interventions Readmission Risk Prevention Plan 11/26/2019  Transportation Screening Complete  PCP or Specialist Appt within 3-5 Days Complete  HRI or [place] Complete  Social Work Consult for  Ensign Planning/Counseling Complete  Palliative Care Screening Not Applicable  Medication Review Press photographer) Complete

## 2019-11-26 NOTE — Progress Notes (Signed)
Progress Note  Patient Name: Maria Harper Date of Encounter: 11/26/2019  Primary Cardiologist: Herbie Baltimore  Subjective   Does not feel well; mild nausea;  Inpatient Medications    Scheduled Meds: . aspirin  81 mg Oral Daily  . atorvastatin  80 mg Oral q1800  . carvedilol  6.25 mg Oral BID WC  . cephALEXin  500 mg Oral Q6H  . Chlorhexidine Gluconate Cloth  6 each Topical Daily  . cloNIDine  0.1 mg Oral BID  . gabapentin  300 mg Oral TID  . insulin aspart  0-20 Units Subcutaneous TID WC  . insulin aspart protamine- aspart  40 Units Subcutaneous BID WC  . isosorbide mononitrate  30 mg Oral Daily  . montelukast  10 mg Oral QHS  . nicotine  14 mg Transdermal Daily  . pantoprazole  40 mg Oral BID  . QUEtiapine  200 mg Oral BH-q7a  . QUEtiapine  400 mg Oral QHS  . sodium chloride flush  3 mL Intravenous Q12H  . ticagrelor  90 mg Oral BID   Continuous Infusions: . sodium chloride    . heparin 1,000 Units/hr (11/26/19 0800)   PRN Meds: sodium chloride, acetaminophen, cyclobenzaprine, morphine injection, nitroGLYCERIN, ondansetron (ZOFRAN) IV, polyethylene glycol powder, sodium chloride flush   Vital Signs    Vitals:   11/25/19 1520 11/25/19 1921 11/25/19 2305 11/26/19 0755  BP: 103/67 132/70 124/70   Pulse: 90     Resp: 19 16 20    Temp: 98.2 F (36.8 C) 98.2 F (36.8 C) 98.2 F (36.8 C) 98.2 F (36.8 C)  TempSrc: Oral Oral Oral Oral  SpO2: 98% 97%    Weight:      Height:        Intake/Output Summary (Last 24 hours) at 11/26/2019 11/28/2019 Last data filed at 11/26/2019 0800 Gross per 24 hour  Intake 389.98 ml  Output 400 ml  Net -10.02 ml    I/O since admission: -760  Filed Weights   11/23/19 1255  Weight: 81.6 kg    Telemetry    Sinus tachycardia 105 - Personally Reviewed  ECG    11/24/2019 ECG (independently read by me): nsr 90; mild STE V2-3; PRWP; NST changes  Physical Exam   BP 124/70 (BP Location: Right Arm)   Pulse 90   Temp 98.2 F (36.8  C) (Oral)   Resp 20   Ht 5\' 2"  (1.575 m)   Wt 81.6 kg   SpO2 97%   BMI 32.92 kg/m  General: Alert, oriented, no distress.  Skin: normal turgor, no rashes, warm and dry HEENT: Normocephalic, atraumatic. Pupils equal round and reactive to light; sclera anicteric; extraocular muscles intact;  Nose without nasal septal hypertrophy Mouth/Parynx benign; Mallinpatti scale Neck: No JVD, no carotid bruits; normal carotid upstroke Lungs: clear to ausculatation and percussion; no wheezing or rales Chest wall: without tenderness to palpitation Heart: PMI not displaced, RRR, s1 s2 normal, 1/6 systolic murmur, no diastolic murmur, no rubs, gallops, thrills, or heaves Abdomen: soft, nontender; no hepatosplenomehaly, BS+; abdominal aorta nontender and not dilated by palpation. Back: no CVA tenderness Pulses 2+ Musculoskeletal: full range of motion, normal strength, no joint deformities Extremities: no clubbing cyanosis or edema, Homan's sign negative  Neurologic: grossly nonfocal; Cranial nerves grossly wnl Psychologic: Normal mood and affect   Labs    Chemistry Recent Labs  Lab 11/23/19 1107 11/23/19 1113 11/24/19 0505  NA 138 136 133*  K 3.8 3.8 3.9  CL 108 107 101  CO2  --  18* 20*  GLUCOSE 213* 212* 349*  BUN 20 18 21*  CREATININE 1.00 1.04* 1.25*  CALCIUM  --  9.0 9.1  PROT  --  6.9  --   ALBUMIN  --  3.6  --   AST  --  16  --   ALT  --  19  --   ALKPHOS  --  74  --   BILITOT  --  0.4  --   GFRNONAA  --  >60 52*  GFRAA  --  >60 60*  ANIONGAP  --  11 12     Hematology Recent Labs  Lab 11/24/19 0505 11/25/19 0328 11/26/19 0024  WBC 11.2* 9.5 8.7  RBC 3.72* 3.80* 3.64*  HGB 10.9* 11.2* 10.6*  HCT 33.5* 34.5* 33.1*  MCV 90.1 90.8 90.9  MCH 29.3 29.5 29.1  MCHC 32.5 32.5 32.0  RDW 21.7* 21.7* 21.5*  PLT 259 229 228    Cardiac EnzymesNo results for input(s): TROPONINI in the last 168 hours. No results for input(s): TROPIPOC in the last 168 hours.   HS Trop: 25 >  4773 > 5913 > 9370 > 10158  BNPNo results for input(s): BNP, PROBNP in the last 168 hours.   DDimer No results for input(s): DDIMER in the last 168 hours.   Lipid Panel     Component Value Date/Time   CHOL 176 11/23/2019 1113   TRIG 94 11/23/2019 1113   HDL 36 (L) 11/23/2019 1113   CHOLHDL 4.9 11/23/2019 1113   VLDL 19 11/23/2019 1113   LDLCALC 121 (H) 11/23/2019 1113     Radiology    ECHOCARDIOGRAM COMPLETE  Result Date: 11/24/2019    ECHOCARDIOGRAM REPORT   Patient Name:   Maria Harper Date of Exam: 11/24/2019 Medical Rec #:  [id number]        Height:       62.0 in Accession #:    [id number]       Weight:       180.0 lb Date of Birth:  [birth year]        BSA:          1.828 m Patient Age:    49 years         BP:           123/72 mmHg Patient Gender: F                HR:           82 bpm. Exam Location:  Inpatient Procedure: 2D Echo, Cardiac Doppler, Color Doppler and Intracardiac            Opacification Agent Indications:    CAD Native Vessel 414.01 / I25.10  History:        Patient has no prior history of Echocardiogram examinations.                 CAD, Arrythmias:STEMI; Risk Factors:Current Smoker.  Sonographer:    Renella Cunas RDCS Referring Phys: [id number] DAVID W HARDING IMPRESSIONS  1. Left ventricular ejection fraction, by estimation, is 45 to 50%. The left ventricle has mildly decreased function. The left ventricle has no regional wall motion abnormalities. Left ventricular diastolic parameters are consistent with Grade I diastolic dysfunction (impaired relaxation). Elevated left atrial pressure.  2. Right ventricular systolic function is normal. The right ventricular size is normal. There is normal pulmonary artery systolic pressure.  3. The mitral valve is normal in structure. No evidence of mitral valve regurgitation. No evidence of  mitral stenosis.  4. The aortic valve is tricuspid. Aortic valve regurgitation is moderate to severe. Mild to moderate aortic valve sclerosis/calcification  is present, without any evidence of aortic stenosis.  5. The inferior vena cava is normal in size with greater than 50% respiratory variability, suggesting right atrial pressure of 3 mmHg. FINDINGS  Left Ventricle: Left ventricular ejection fraction, by estimation, is 45 to 50%. The left ventricle has mildly decreased function. The left ventricle has no regional wall motion abnormalities. Definity contrast agent was given IV to delineate the left ventricular endocardial borders. The left ventricular internal cavity size was normal in size. There is no left ventricular hypertrophy. Left ventricular diastolic parameters are consistent with Grade I diastolic dysfunction (impaired relaxation). Elevated left atrial pressure.  LV Wall Scoring: The apical septal segment, apical inferior segment, and apex are akinetic. Right Ventricle: The right ventricular size is normal. No increase in right ventricular wall thickness. Right ventricular systolic function is normal. There is normal pulmonary artery systolic pressure. The tricuspid regurgitant velocity is 2.15 m/s, and  with an assumed right atrial pressure of 8 mmHg, the estimated right ventricular systolic pressure is 26.5 mmHg. Left Atrium: Left atrial size was normal in size. Right Atrium: Right atrial size was normal in size. Pericardium: There is no evidence of pericardial effusion. Mitral Valve: The mitral valve is normal in structure. Normal mobility of the mitral valve leaflets. No evidence of mitral valve regurgitation. No evidence of mitral valve stenosis. Tricuspid Valve: The tricuspid valve is normal in structure. Tricuspid valve regurgitation is trivial. No evidence of tricuspid stenosis. Aortic Valve: The aortic valve is tricuspid. Aortic valve regurgitation is moderate to severe. Aortic regurgitation PHT measures 584 msec. Mild to moderate aortic valve sclerosis/calcification is present, without any evidence of aortic stenosis. Pulmonic Valve: The pulmonic  valve was normal in structure. Pulmonic valve regurgitation is not visualized. No evidence of pulmonic stenosis. Aorta: The aortic root is normal in size and structure. Venous: The inferior vena cava is normal in size with greater than 50% respiratory variability, suggesting right atrial pressure of 3 mmHg. IAS/Shunts: No atrial level shunt detected by color flow Doppler.  LEFT VENTRICLE PLAX 2D LVIDd:         4.40 cm      Diastology LVIDs:         3.40 cm      LV e' lateral:   3.49 cm/s LV PW:         1.00 cm      LV E/e' lateral: 26.0 LV IVS:        0.80 cm      LV e' medial:    4.05 cm/s LVOT diam:     1.70 cm      LV E/e' medial:  22.4 LV SV:         39 LV SV Index:   22 LVOT Area:     2.27 cm  LV Volumes (MOD) LV vol d, MOD A2C: 116.0 ml LV vol d, MOD A4C: 159.0 ml LV vol s, MOD A2C: 67.6 ml LV vol s, MOD A4C: 89.5 ml LV SV MOD A2C:     48.4 ml LV SV MOD A4C:     159.0 ml LV SV MOD BP:      58.1 ml RIGHT VENTRICLE RV S prime:     12.60 cm/s TAPSE (M-mode): 1.7 cm LEFT ATRIUM             Index  RIGHT ATRIUM          Index LA diam:        3.80 cm 2.08 cm/m  RA Area:     9.23 cm LA Vol (A2C):   25.8 ml 14.11 ml/m RA Volume:   15.40 ml 8.42 ml/m LA Vol (A4C):   32.9 ml 18.00 ml/m LA Biplane Vol: 29.2 ml 15.97 ml/m  AORTIC VALVE LVOT Vmax:   94.60 cm/s LVOT Vmean:  60.000 cm/s LVOT VTI:    0.174 m AI PHT:      584 msec  AORTA Ao Root diam: 2.50 cm MITRAL VALVE                TRICUSPID VALVE MV Area (PHT): 3.65 cm     TR Peak grad:   18.5 mmHg MV Decel Time: 208 msec     TR Vmax:        215.00 cm/s MV E velocity: 90.80 cm/s MV A velocity: 106.00 cm/s  SHUNTS MV E/A ratio:  0.86         Systemic VTI:  0.17 m                             Systemic Diam: 1.70 cm Candee Furbish MD Electronically signed by Candee Furbish MD Signature Date/Time: 11/24/2019/3:11:49 PM    Final     Cardiac Studies    Previously placed Mid LAD to Dist LAD stent (unknown type) is widely patent.  Mid LAD lesion is 30%  stenosed.  CULPRIT LESION: Dist LAD-1 lesion is 100% stenosed with 100% stenosed side branch in 3rd Diag.  Unsuccessful balloon angioplasty was performed using a BALLOON SAPPHIRE 2.0X12 -unable to advance into the distal vessel.  Post intervention, there is a 70% residual stenosis followed by Dist LAD-2 lesion is 99% stenosed. Post intervention, the side branch was remained at 100% residual stenosis.  --------------------------  RPDA lesion is 80% stenosed-tapers into very small vessel..  LV end diastolic pressure is severely elevated. Therefore no the ventriculogram was performed.  There is no aortic valve stenosis.   Intervention    ECHO IMPRESSIONS  1. Left ventricular ejection fraction, by estimation, is 45 to 50%. The  left ventricle has mildly decreased function. The left ventricle has no  regional wall motion abnormalities. Left ventricular diastolic parameters  are consistent with Grade I  diastolic dysfunction (impaired relaxation). Elevated left atrial  pressure.  2. Right ventricular systolic function is normal. The right ventricular  size is normal. There is normal pulmonary artery systolic pressure.  3. The mitral valve is normal in structure. No evidence of mitral valve  regurgitation. No evidence of mitral stenosis.  4. The aortic valve is tricuspid. Aortic valve regurgitation is moderate  to severe. Mild to moderate aortic valve sclerosis/calcification is  present, without any evidence of aortic stenosis.  5. The inferior vena cava is normal in size with greater than 50%  respiratory variability, suggesting right atrial pressure of 3 mmHg.   Patient Profile     49 y.o. female with a limited anteroapical STEMI related to distal occlusion of the LAD artery and diagonal branch, with unsuccessful attempt at revascularization. Has previously placed mid LAD stent and 80% stenosis of small RPDA. 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   Assessment & Plan    1. Day 3 s/p apical LAD STEMI: Angiogram reviewed. Now off NTG, on  oral imdur 30 mg daily. Carrvedilol 6.25 mg started yesterday; not yet steady state probably up titrate tomorrow if HR stays elevated. ON DAPT. F/U ECG today.  2. Hyperlipidemia: LDL 121. Discussed need for aggression lowering in attempt to induce plaque regresion of concomitant CAD. Now on atorvastatin at 80 mg.  3. HTN: BP currently stable on coreg, imdur.  With recent MI, mild LV dysfunction and DM2 will add low dose ARB with losartan 25 mg today and titrate as BP allows.   4. Tobacco: discussed absolute need to DC  5. DM: HbA1c 9.6, improved from 2/21 13.7.  Needs improved control. On insulin.  6. H/O cocaine: discussed at length with potentially reccurrent MI and fatality if resumes.  Signed, Lennette Bihari, MD, [facility] 11/26/2019, 8:22 AM

## 2019-11-27 LAB — BASIC METABOLIC PANEL
Anion gap: 7 (ref 5–15)
BUN: 12 mg/dL (ref 6–20)
CO2: 22 mmol/L (ref 22–32)
Calcium: 8.4 mg/dL — ABNORMAL LOW (ref 8.9–10.3)
Chloride: 103 mmol/L (ref 98–111)
Creatinine, Ser: 0.97 mg/dL (ref 0.44–1.00)
GFR calc Af Amer: >60 mL/min (ref >60)
GFR calc non Af Amer: >60 mL/min (ref >60)
Glucose, Bld: 269 mg/dL — ABNORMAL HIGH (ref 70–99)
Potassium: 3.7 mmol/L (ref 3.5–5.1)
Sodium: 132 mmol/L — ABNORMAL LOW (ref 135–145)

## 2019-11-27 LAB — URINE CULTURE: Culture: >=100000 — AB

## 2019-11-27 LAB — HEPARIN LEVEL (UNFRACTIONATED): Heparin Unfractionated: 0.2 IU/mL — ABNORMAL LOW (ref 0.30–0.70)

## 2019-11-27 LAB — GLUCOSE, CAPILLARY
Glucose-Capillary: 287 mg/dL — ABNORMAL HIGH (ref 70–99)
Glucose-Capillary: 263 mg/dL — ABNORMAL HIGH (ref 70–99)
Glucose-Capillary: 245 mg/dL — ABNORMAL HIGH (ref 70–99)
Glucose-Capillary: 224 mg/dL — ABNORMAL HIGH (ref 70–99)

## 2019-11-27 LAB — CBC
HCT: 31.8 % — ABNORMAL LOW (ref 36.0–46.0)
Hemoglobin: 10.1 g/dL — ABNORMAL LOW (ref 12.0–15.0)
MCH: 28.8 pg (ref 26.0–34.0)
MCHC: 31.8 g/dL (ref 30.0–36.0)
MCV: 90.6 fL (ref 80.0–100.0)
Platelets: 215 10*3/uL (ref 150–400)
RBC: 3.51 MIL/uL — ABNORMAL LOW (ref 3.87–5.11)
RDW: 21.6 % — ABNORMAL HIGH (ref 11.5–15.5)
WBC: 9.1 10*3/uL (ref 4.0–10.5)
nRBC: 0 % (ref 0.0–0.2)

## 2019-11-27 MED ORDER — INSULIN ASPART PROT & ASPART (70-30 MIX) 100 UNIT/ML ~~LOC~~ SUSP
45.0000 [IU] | Freq: Two times a day (BID) | SUBCUTANEOUS | Status: DC
  Administered 2019-11-27 – 2019-11-28 (×3): 45 [IU] via SUBCUTANEOUS

## 2019-11-27 MED ORDER — POTASSIUM CHLORIDE CRYS ER 20 MEQ PO TBCR
40.0000 meq | EXTENDED_RELEASE_TABLET | Freq: Once | ORAL | Status: AC
  Administered 2019-11-27: 40 meq via ORAL
  Filled 2019-11-27: qty 2

## 2019-11-27 MED ORDER — CARVEDILOL 12.5 MG PO TABS
12.5000 mg | ORAL_TABLET | Freq: Two times a day (BID) | ORAL | Status: DC
  Administered 2019-11-27 – 2019-11-28 (×3): 12.5 mg via ORAL
  Filled 2019-11-27 (×3): qty 1

## 2019-11-27 MED ORDER — MORPHINE SULFATE (PF) 2 MG/ML IV SOLN
1.0000 mg | Freq: Four times a day (QID) | INTRAVENOUS | Status: DC | PRN
  Administered 2019-11-27 – 2019-11-28 (×3): 1 mg via INTRAVENOUS
  Filled 2019-11-27 (×3): qty 1

## 2019-11-27 MED ORDER — CARVEDILOL 6.25 MG PO TABS
6.2500 mg | ORAL_TABLET | Freq: Once | ORAL | Status: AC
  Administered 2019-11-27: 6.25 mg via ORAL
  Filled 2019-11-27: qty 1

## 2019-11-27 MED ORDER — ENOXAPARIN SODIUM 40 MG/0.4ML ~~LOC~~ SOLN
40.0000 mg | Freq: Every day | SUBCUTANEOUS | Status: DC
  Administered 2019-11-27 – 2019-11-28 (×2): 40 mg via SUBCUTANEOUS
  Filled 2019-11-27 (×2): qty 0.4

## 2019-11-27 NOTE — Progress Notes (Signed)
CARDIAC REHAB PHASE I   PRE:  Rate/Rhythm: 97 SR    BP: sitting to BR    SaO2:   MODE:  Ambulation: 1200 ft   POST:  Rate/Rhythm: 105 ST    BP: sitting 124/75     SaO2:    Pt initially stated she didn't feel well today and "has things on her mind". Declined ambulation but agreeable to education. Discussed MI, PTCA, Brilinta, restrictions, smoking cessation, diet, exercise, NTG and CRPII. Pt receptive. She quit cocaine 2 years ago, doing well now. She now wants to quit smoking. Very receptive to education and wants a nicotine patch at d/c. Understands importance of meds and continuing work on DM. Encouraged more exercise and will refer to [place] CRPII. After education pt was able to walk long distance in hall. She has leg pain with distance, which is her baseline.  [id number]  Maria Harper CES, ACSM 11/27/2019 11:15 AM

## 2019-11-27 NOTE — Plan of Care (Signed)
  Problem: Education: Goal: Understanding of cardiac disease, CV risk reduction, and recovery process will improve Outcome: Progressing Goal: Understanding of medication regimen will improve Outcome: Progressing   Problem: Activity: Goal: Ability to tolerate increased activity will improve Outcome: Progressing   Problem: Cardiac: Goal: Ability to achieve and maintain adequate cardiopulmonary perfusion will improve Outcome: Progressing   

## 2019-11-27 NOTE — Progress Notes (Signed)
Progress Note  Patient Name: Maria Harper Date of Encounter: 11/27/2019  Primary Cardiologist: Herbie Baltimore  Subjective   Feels improved today.  However she has not had much ambulation.  Cardiac rehab in with her today  Inpatient Medications    Scheduled Meds: . aspirin  81 mg Oral Daily  . atorvastatin  80 mg Oral q1800  . carvedilol  6.25 mg Oral BID WC  . cephALEXin  500 mg Oral Q6H  . Chlorhexidine Gluconate Cloth  6 each Topical Daily  . cloNIDine  0.1 mg Oral BID  . gabapentin  300 mg Oral TID  . insulin aspart  0-20 Units Subcutaneous TID WC  . insulin aspart protamine- aspart  40 Units Subcutaneous BID WC  . isosorbide mononitrate  30 mg Oral Daily  . losartan  25 mg Oral Daily  . montelukast  10 mg Oral QHS  . nicotine  14 mg Transdermal Daily  . pantoprazole  40 mg Oral BID  . QUEtiapine  200 mg Oral BH-q7a  . QUEtiapine  400 mg Oral QHS  . sodium chloride flush  3 mL Intravenous Q12H  . ticagrelor  90 mg Oral BID   Continuous Infusions: . sodium chloride    . heparin 1,200 Units/hr (11/27/19 0810)   PRN Meds: sodium chloride, acetaminophen, cyclobenzaprine, morphine injection, nitroGLYCERIN, ondansetron (ZOFRAN) IV, polyethylene glycol powder, sodium chloride flush   Vital Signs    Vitals:   11/26/19 1900 11/26/19 2300 11/27/19 0300 11/27/19 0725  BP: (!) 104/52 129/66 131/74 122/78  Pulse: 87 93 92 92  Resp: 18 13 14 15   Temp: 98.2 F (36.8 C) 97.9 F (36.6 C) 97.9 F (36.6 C) 98 F (36.7 C)  TempSrc: Oral Oral Oral Oral  SpO2: 99% 97% 98% 99%  Weight:      Height:        Intake/Output Summary (Last 24 hours) at 11/27/2019 1005 Last data filed at 11/27/2019 0000 Gross per 24 hour  Intake 1040 ml  Output --  Net 1040 ml    I/O since admission: +899  Filed Weights   11/23/19 1255  Weight: 81.6 kg    Telemetry    Sinus tachycardia 105 - Personally Reviewed  ECG    11/26/2019 ECG (independently read by me): Normal sinus rhythm at  97 bpm, left atrial enlargement, anterolateral ST-T changes.  Poor anterior R wave progression.  QTc interval 454 ms.  11/24/2019 ECG (independently read by me): nsr 90; mild STE V2-3; PRWP; NST changes  Physical Exam   BP 122/78 (BP Location: Right Arm)   Pulse 92   Temp 98 F (36.7 C) (Oral)   Resp 15   Ht 5\' 2"  (1.575 m)   Wt 81.6 kg   LMP 11/20/2019   SpO2 99%   BMI 32.92 kg/m  General: Alert, oriented, no distress.  Skin: normal turgor, no rashes, warm and dry HEENT: Normocephalic, atraumatic. Pupils equal round and reactive to light; sclera anicteric; extraocular muscles intact;  Nose without nasal septal hypertrophy Mouth/Parynx benign; Mallinpatti scale Neck: No JVD, no carotid bruits; normal carotid upstroke Lungs: clear to ausculatation and percussion; no wheezing or rales Chest wall: without tenderness to palpitation Heart: PMI not displaced, RRR, s1 s2 normal, 1/6 systolic murmur, no diastolic murmur, no rubs, gallops, thrills, or heaves Abdomen: soft, nontender; no hepatosplenomehaly, BS+; abdominal aorta nontender and not dilated by palpation. Back: no CVA tenderness Pulses 2+ minimal hematoma above the right radial cath site Musculoskeletal: full range of motion, normal  strength, no joint deformities Extremities: no clubbing cyanosis or edema, Homan's sign negative  Neurologic: grossly nonfocal; Cranial nerves grossly wnl Psychologic: Normal mood and affect   Labs    Chemistry Recent Labs  Lab 11/23/19 1113 11/24/19 0505 11/27/19 0209  NA 136 133* 132*  K 3.8 3.9 3.7  CL 107 101 103  CO2 18* 20* 22  GLUCOSE 212* 349* 269*  BUN 18 21* 12  CREATININE 1.04* 1.25* 0.97  CALCIUM 9.0 9.1 8.4*  PROT 6.9  --   --   ALBUMIN 3.6  --   --   AST 16  --   --   ALT 19  --   --   ALKPHOS 74  --   --   BILITOT 0.4  --   --   GFRNONAA >60 52* >60  GFRAA >60 60* >60  ANIONGAP 11 12 7      Hematology Recent Labs  Lab 11/25/19 0328 11/26/19 0024  11/27/19 0209  WBC 9.5 8.7 9.1  RBC 3.80* 3.64* 3.51*  HGB 11.2* 10.6* 10.1*  HCT 34.5* 33.1* 31.8*  MCV 90.8 90.9 90.6  MCH 29.5 29.1 28.8  MCHC 32.5 32.0 31.8  RDW 21.7* 21.5* 21.6*  PLT 229 228 215    Cardiac EnzymesNo results for input(s): TROPONINI in the last 168 hours. No results for input(s): TROPIPOC in the last 168 hours.   HS Trop: 25 > 4773 > 5913 > 9370 > 10158  BNPNo results for input(s): BNP, PROBNP in the last 168 hours.   DDimer No results for input(s): DDIMER in the last 168 hours.   Lipid Panel     Component Value Date/Time   CHOL 176 11/23/2019 1113   TRIG 94 11/23/2019 1113   HDL 36 (L) 11/23/2019 1113   CHOLHDL 4.9 11/23/2019 1113   VLDL 19 11/23/2019 1113   LDLCALC 121 (H) 11/23/2019 1113     Radiology    No results found.  Cardiac Studies    Previously placed Mid LAD to Dist LAD stent (unknown type) is widely patent.  Mid LAD lesion is 30% stenosed.  CULPRIT LESION: Dist LAD-1 lesion is 100% stenosed with 100% stenosed side branch in 3rd Diag.  Unsuccessful balloon angioplasty was performed using a BALLOON SAPPHIRE 2.0X12 -unable to advance into the distal vessel.  Post intervention, there is a 70% residual stenosis followed by Dist LAD-2 lesion is 99% stenosed. Post intervention, the side branch was remained at 100% residual stenosis.  --------------------------  RPDA lesion is 80% stenosed-tapers into very small vessel..  LV end diastolic pressure is severely elevated. Therefore no the ventriculogram was performed.  There is no aortic valve stenosis.   Intervention    ECHO IMPRESSIONS  1. Left ventricular ejection fraction, by estimation, is 45 to 50%. The  left ventricle has mildly decreased function. The left ventricle has no  regional wall motion abnormalities. Left ventricular diastolic parameters  are consistent with Grade I  diastolic dysfunction (impaired relaxation). Elevated left atrial  pressure.  2. Right  ventricular systolic function is normal. The right ventricular  size is normal. There is normal pulmonary artery systolic pressure.  3. The mitral valve is normal in structure. No evidence of mitral valve  regurgitation. No evidence of mitral stenosis.  4. The aortic valve is tricuspid. Aortic valve regurgitation is moderate  to severe. Mild to moderate aortic valve sclerosis/calcification is  present, without any evidence of aortic stenosis.  5. The inferior vena cava is normal in size with  greater than 50%  respiratory variability, suggesting right atrial pressure of 3 mmHg.   Patient Profile     49 y.o. female with a limited anteroapical STEMI related to distal occlusion of the LAD artery and diagonal branch, with unsuccessful attempt at revascularization. Has previously placed mid LAD stent and 80% stenosis of small RPDA. 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   Assessment & Plan    1. Day 4 s/p apical LAD STEMI: Angiogram reviewed.  No longer on IV nitroglycerin and tolerating isosorbide 30 mg daily.  Heart rate today is 97 on her carvedilol 3.125 mg twice daily regimen.  She is now received 4 doses and we will further titrate to 12.5 mg twice a day.  He continues to be on DAPT.  Follow-up ECG done yesterday was reviewed with anterolateral ST-T changes.  There is poor anterior poor R wave progression.  2. Hyperlipidemia: LDL 121.  I previously discussed aggressive lipid-lowering therapy with target LDL less than 70 and preferably in the 50s and attempt to induce plaque regression.  She is tolerating this without side effects.  3. HTN: BP currently stable on coreg, imdur.  With recent MI, mild LV dysfunction and DM2 low dose ARB with losartan 25 mg was initiated yesterday.  Plan future titration as blood pressure allows.  4. Tobacco: again discussed absolute need to DC  5. DM: HbA1c 9.6, improved from 2/21 13.7.   Cause remains elevated.  70/30 insulin was increased  6. H/O cocaine: discussed at length with potentially reccurrent MI and fatality if resumes.  Continue cardiac rehab.  Will DC heparin.  Transfer patient to 6 E. cardiac telemetry today.  Increase ambulation.  Anticipate discharge tomorrow.  Signed, Troy Sine, MD, [facility] 11/27/2019, 10:05 AM

## 2019-11-27 NOTE — Progress Notes (Signed)
ANTICOAGULATION CONSULT NOTE   Pharmacy Consult for heparin  Indication: chest pain/ACS  Allergies  Allergen Reactions  . Contrast Media [Iodinated Diagnostic Agents] Hives and Itching  . Tomato Itching and Rash    Patient Measurements: Height: 5\' 2"  (157.5 cm) Weight: 81.6 kg (180 lb) IBW/kg (Calculated) : 50.1 Heparin Dosing Weight: 68kg  Vital Signs: Temp: 98 F (36.7 C) (04/27 0725) Temp Source: Oral (04/27 0725) BP: 122/78 (04/27 0725) Pulse Rate: 92 (04/27 0725)  Labs: Recent Labs    11/25/19 0328 11/25/19 0328 11/25/19 0759 11/26/19 0024 11/27/19 0209  HGB 11.2*   < >  --  10.6* 10.1*  HCT 34.5*  --   --  33.1* 31.8*  PLT 229  --   --  228 215  HEPARINUNFRC  --   --  0.39 0.29* 0.20*  CREATININE  --   --   --   --  0.97   < > = values in this interval not displayed.    Estimated Creatinine Clearance: 71.7 mL/min (by C-G formula based on SCr of 0.97 mg/dL).  Assessment: 49 y.o. female with STEMI s/p cath and unsuccessful angioplasty on heparin -heparin level below goal -hg= 10.6    Goal of Therapy:  Heparin level 0.3-0.5 units/ml Monitor platelets by anticoagulation protocol: Yes   Plan:  -Increase heparin to 1200 units/hr -Heparin level in 6 hours and daily wth CBC daily   51, PharmD Clinical Pharmacist **Pharmacist phone directory can now be found on [email or web address] ([email or web address]).  Listed under [facility].

## 2019-11-28 DIAGNOSIS — I10 Essential (primary) hypertension: Secondary | ICD-10-CM

## 2019-11-28 DIAGNOSIS — N390 Urinary tract infection, site not specified: Secondary | ICD-10-CM

## 2019-11-28 LAB — GLUCOSE, CAPILLARY
Glucose-Capillary: 212 mg/dL — ABNORMAL HIGH (ref 70–99)
Glucose-Capillary: 215 mg/dL — ABNORMAL HIGH (ref 70–99)
Glucose-Capillary: 244 mg/dL — ABNORMAL HIGH (ref 70–99)
Glucose-Capillary: 313 mg/dL — ABNORMAL HIGH (ref 70–99)

## 2019-11-28 LAB — BASIC METABOLIC PANEL
Anion gap: 11 (ref 5–15)
BUN: 9 mg/dL (ref 6–20)
CO2: 21 mmol/L — ABNORMAL LOW (ref 22–32)
Calcium: 9.1 mg/dL (ref 8.9–10.3)
Chloride: 104 mmol/L (ref 98–111)
Creatinine, Ser: 1.05 mg/dL — ABNORMAL HIGH (ref 0.44–1.00)
GFR calc Af Amer: >60 mL/min (ref >60)
GFR calc non Af Amer: >60 mL/min (ref >60)
Glucose, Bld: 232 mg/dL — ABNORMAL HIGH (ref 70–99)
Potassium: 4 mmol/L (ref 3.5–5.1)
Sodium: 136 mmol/L (ref 135–145)

## 2019-11-28 LAB — CBC
HCT: 32.2 % — ABNORMAL LOW (ref 36.0–46.0)
Hemoglobin: 10.4 g/dL — ABNORMAL LOW (ref 12.0–15.0)
MCH: 29.7 pg (ref 26.0–34.0)
MCHC: 32.3 g/dL (ref 30.0–36.0)
MCV: 92 fL (ref 80.0–100.0)
Platelets: 213 10*3/uL (ref 150–400)
RBC: 3.5 MIL/uL — ABNORMAL LOW (ref 3.87–5.11)
RDW: 21.7 % — ABNORMAL HIGH (ref 11.5–15.5)
WBC: 8.6 10*3/uL (ref 4.0–10.5)
nRBC: 0 % (ref 0.0–0.2)

## 2019-11-28 MED ORDER — CEPHALEXIN 500 MG PO CAPS: 500.0000 mg | ORAL_CAPSULE | Freq: Four times a day (QID) | ORAL | 0 refills | Status: DC

## 2019-11-28 MED ORDER — TICAGRELOR 90 MG PO TABS: 90.0000 mg | ORAL_TABLET | Freq: Two times a day (BID) | ORAL | 11 refills | Status: DC

## 2019-11-28 MED ORDER — NICOTINE 14 MG/24HR TD PT24: 14.0000 mg | MEDICATED_PATCH | Freq: Every day | TRANSDERMAL | 0 refills | Status: DC

## 2019-11-28 MED ORDER — CARVEDILOL 12.5 MG PO TABS: 18.7500 mg | ORAL_TABLET | Freq: Two times a day (BID) | ORAL | 2 refills | Status: DC

## 2019-11-28 MED ORDER — LOSARTAN POTASSIUM 25 MG PO TABS: 25.0000 mg | ORAL_TABLET | Freq: Every day | ORAL | 2 refills | Status: DC

## 2019-11-28 MED ORDER — ISOSORBIDE MONONITRATE ER 30 MG PO TB24: 30.0000 mg | ORAL_TABLET | Freq: Every day | ORAL | 2 refills | Status: DC

## 2019-11-28 MED ORDER — NITROGLYCERIN 0.4 MG SL SUBL: 0.4000 mg | SUBLINGUAL_TABLET | SUBLINGUAL | 1 refills | Status: DC | PRN

## 2019-11-28 MED FILL — BRILINTA 90 MG TABLET: 90 | 30 days supply | Qty: 60 | Fill #0

## 2019-11-28 MED FILL — NITROGLYCERIN 0.4 MG TAB SL: 0.4 | 8 days supply | Qty: 25 | Fill #0

## 2019-11-28 MED FILL — CARVEDILOL 12.5 MG TABLET: 12.5 | 30 days supply | Qty: 90 | Fill #0

## 2019-11-28 MED FILL — CEPHALEXIN 500 MG CAPS: 500 | 2 days supply | Qty: 6 | Fill #0

## 2019-11-28 MED FILL — LOSARTAN POTASSIUM 25 MG TA: 25 | 30 days supply | Qty: 30 | Fill #0

## 2019-11-28 MED FILL — ISOSORBIDE MN ER 30 MG TAB: 30 | 30 days supply | Qty: 30 | Fill #0

## 2019-11-28 NOTE — Progress Notes (Addendum)
Progress Note  Patient Name: Maria Harper Date of Encounter: 11/28/2019  Primary Cardiologist: No primary care provider on file.   Subjective   No acute overnight events. Patient denies any chest pain, shortness of breath, or palpitations. Able to ambulate without any problems.  Inpatient Medications    Scheduled Meds: . aspirin  81 mg Oral Daily  . atorvastatin  80 mg Oral q1800  . carvedilol  12.5 mg Oral BID WC  . cephALEXin  500 mg Oral Q6H  . Chlorhexidine Gluconate Cloth  6 each Topical Daily  . cloNIDine  0.1 mg Oral BID  . enoxaparin (LOVENOX) injection  40 mg Subcutaneous Daily  . gabapentin  300 mg Oral TID  . insulin aspart  0-20 Units Subcutaneous TID WC  . insulin aspart protamine- aspart  45 Units Subcutaneous BID WC  . isosorbide mononitrate  30 mg Oral Daily  . losartan  25 mg Oral Daily  . montelukast  10 mg Oral QHS  . nicotine  14 mg Transdermal Daily  . pantoprazole  40 mg Oral BID  . QUEtiapine  200 mg Oral BH-q7a  . QUEtiapine  400 mg Oral QHS  . sodium chloride flush  3 mL Intravenous Q12H  . ticagrelor  90 mg Oral BID   Continuous Infusions: . sodium chloride     PRN Meds: sodium chloride, acetaminophen, cyclobenzaprine, morphine injection, nitroGLYCERIN, ondansetron (ZOFRAN) IV, polyethylene glycol powder, sodium chloride flush   Vital Signs    Vitals:   11/28/19 0423 11/28/19 0740 11/28/19 0927 11/28/19 1116  BP: 96/63 117/69 116/74 119/88  Pulse:      Resp: 17 18 15 20   Temp: 98 F (36.7 C) 97.9 F (36.6 C)  98.3 F (36.8 C)  TempSrc: Oral Oral  Oral  SpO2:  97%    Weight:      Height:        Intake/Output Summary (Last 24 hours) at 11/28/2019 1349 Last data filed at 11/28/2019 0921 Gross per 24 hour  Intake 243 ml  Output --  Net 243 ml   Last 3 Weights 11/23/2019 09/19/2019 02/17/2019  Weight (lbs) 180 lb 180 lb 164 lb  Weight (kg) 81.647 kg 81.647 kg 74.39 kg      Telemetry    Normal sinus rhythm with rates in the  80's. - Personally Reviewed  ECG    No new ECG tracing today. - Personally Reviewed  Physical Exam   GEN: No acute distress.   Neck: Supple. Cardiac: RRR. Soft systolic murmur. No rubs or gallops. Right radial and femoral cath site soft with no signs of hematoma. Respiratory: Clear to auscultation bilaterally. GI: Soft, nontender, non-distended MS: No edema. No deformity. Skin: Warm and dry. Neuro:  No focal deficits. Psych: Normal affect.  Labs    High Sensitivity Troponin:   Recent Labs  Lab 11/23/19 1113 11/23/19 1737 11/23/19 1915 11/23/19 2159 11/23/19 2329  TROPONINIHS 24* 4,773* 5,913* 9,370* 10,158*      Chemistry Recent Labs  Lab 11/23/19 1113 11/23/19 1113 11/24/19 0505 11/27/19 0209 11/28/19 0247  NA 136   < > 133* 132* 136  K 3.8   < > 3.9 3.7 4.0  CL 107   < > 101 103 104  CO2 18*   < > 20* 22 21*  GLUCOSE 212*   < > 349* 269* 232*  BUN 18   < > 21* 12 9  CREATININE 1.04*   < > 1.25* 0.97 1.05*  CALCIUM 9.0   < >  9.1 8.4* 9.1  PROT 6.9  --   --   --   --   ALBUMIN 3.6  --   --   --   --   AST 16  --   --   --   --   ALT 19  --   --   --   --   ALKPHOS 74  --   --   --   --   BILITOT 0.4  --   --   --   --   GFRNONAA >60   < > 52* >60 >60  GFRAA >60   < > 60* >60 >60  ANIONGAP 11   < > 12 7 11    < > = values in this interval not displayed.     Hematology Recent Labs  Lab 11/26/19 0024 11/27/19 0209 11/28/19 0247  WBC 8.7 9.1 8.6  RBC 3.64* 3.51* 3.50*  HGB 10.6* 10.1* 10.4*  HCT 33.1* 31.8* 32.2*  MCV 90.9 90.6 92.0  MCH 29.1 28.8 29.7  MCHC 32.0 31.8 32.3  RDW 21.5* 21.6* 21.7*  PLT 228 215 213    BNPNo results for input(s): BNP, PROBNP in the last 168 hours.   DDimer No results for input(s): DDIMER in the last 168 hours.   Radiology    No results found.  Cardiac Studies   Left Heart Catheterization 11/23/2019:   Previously placed Mid LAD to Dist LAD stent (unknown type) is widely patent.   Mid LAD lesion is 30%  stenosed.   CULPRIT LESION: Dist LAD-1 lesion is 100% stenosed with 100% stenosed side branch in 3rd Diag.   Unsuccessful balloon angioplasty was performed using a BALLOON SAPPHIRE 2.0X12 -unable to advance into the distal vessel.   Post intervention, there is a 70% residual stenosis followed by Dist LAD-2 lesion is 99% stenosed. Post intervention, the side branch was remained at 100% residual stenosis.   --------------------------   RPDA lesion is 80% stenosed-tapers into very small vessel..   LV end diastolic pressure is severely elevated. Therefore no the ventriculogram was performed.   There is no aortic valve stenosis.  _______________  Echocardiogram 11/25/2019:  Impressions:  1. Left ventricular ejection fraction, by estimation, is 45 to 50%. The  left ventricle has mildly decreased function. The left ventricle has no  regional wall motion abnormalities. Left ventricular diastolic parameters  are consistent with Grade I  diastolic dysfunction (impaired relaxation). Elevated left atrial  pressure.  2. Right ventricular systolic function is normal. The right ventricular  size is normal. There is normal pulmonary artery systolic pressure.  3. The mitral valve is normal in structure. No evidence of mitral valve  regurgitation. No evidence of mitral stenosis.  4. The aortic valve is tricuspid. Aortic valve regurgitation is moderate  to severe. Mild to moderate aortic valve sclerosis/calcification is  present, without any evidence of aortic stenosis.  5. The inferior vena cava is normal in size with greater than 50%  respiratory variability, suggesting right atrial pressure of 3 mmHg.    Patient Profile     49 y.o. female with a history of CAD with self-reported MI in 04/2016 in [place], [place]; hypertension; hyperlipidemia; diabetes mellitus; GERD; and cocaine abuse who presented on 11/23/2019 with anterior STEMI.     Assessment & Plan    Acute STEMI  -  High-sensitivity troponin peaked at 10,158.  - Left cardiac catheterization showed 100% stenosis of distal LAD-1 and 100% stenosis of side branch in 3rd Diag.  Unfortunately unsuccessful balloon angioplasty as unable to advance into distal vessel. Post-intervention, there was 70% residual stenosis followed by a 99% distal LAD-2 lesion. Post-intervention, side branch remained at 100% residual stenosis. Previously placed mid to distal LAD stent was widely patent. LVEDP severely elevated.  - Echo showed LVEF of 45-50% with normal wall motion, grade 1 diastolic dysfunction, moderate to severe AI.  - No angina.  - Continue dual antiplatelet therapy with Aspirin 81mg  daily and Brilinta 90mg  twice daily. - Home Metoprolol changed to Coreg this admission. Will discharge on 12.5mg  twice daily. - Losartan 25mg  daily and Imdur 30mg  daily started this admission. - Continue Lipitor 80mg  daily.  Hypertension -  BP well controlled. - Continue Losartan, Coreg, and Imdur as above. Also on Clonidine 0.1mg  twice daily.  Hyperlipidemia  Lipid panel this admission: Total Cholesterol 176, Triglycerides 94, HDL 36, LDL 121. LDL goal <70 given CAD. Concern for non-compliance as patient already on high-intensity statin at home.  - Continue Lipitor 80mg  daily.    Type 2 Diabetes Mellitus  Hemoglobin A1c 9.6% this admission. Still poorly controlled but improved from 13.7 in 09/2019.  - ON sliding scale insulin here. - Restart home medications at discharge.  Tobacco Abuse  - Complete cessation strongly encouraged and discussed at length throughout admission.  - Currently on Nicotine patch will prescribe this at discharge as well.  History of Cocaine Abuse  - Strongly advised against future use due to the potential for lethal complications.   UTI - Curine culture positive for klebsiella pneumoniae. - Finish 5 day course of Keflex.   For questions or updates, please contact [facility] Please consult  [email or web address] for contact info under        Signed, , PA-C  11/28/2019, 1:49 PM     Patient seen and examined. Agree with assessment and plan. Feels well without recurrent chest pain. Still not well beta blocked; increae coreg to 12.5 mg bid.  OK to dc today with F/U with Dr. .   , MD, [facility] 11/28/2019 3:41 PM

## 2019-11-28 NOTE — Progress Notes (Signed)
CARDIAC REHAB PHASE I   PRE:  Rate/Rhythm: 89 SR    BP: sitting 124/71    SaO2:   MODE:  Ambulation: 1200 ft   POST:  Rate/Rhythm: 100 ST    BP: sitting 119/88     SaO2:   Tolerated well, denies c/o. Eager to d/c. Reviewed ed, pt planning to begin making changes. [id number]   Harriet Masson CES, ACSM 11/28/2019 11:19 AM

## 2019-11-28 NOTE — Progress Notes (Signed)
Spoke with patient briefly and congratulated her for her improved A1C from 13.7 down too 9.6%.  Reminded her of A1C goal of 7%.  She states that she is working hard and will continue to do her best.  Will f/u with PCP.   Thanks  Beryl Meager, RN, BC-ADM Inpatient Diabetes Coordinator Pager [phone] (8a-5p)

## 2019-11-28 NOTE — Progress Notes (Signed)
Removed PIV access and received medications and discharge instructions. Pt understood it very well. Gave patient a taxi boucher. Pt took all her belongings.

## 2019-11-28 NOTE — Discharge Summary (Signed)
Discharge Summary    Patient ID: Maria Harper MRN: [id number]; DOB: [birth year]  Admit date: 11/23/2019 Discharge date: 11/28/2019  Primary Care Provider: Lavinia Sharps, NP  Primary Cardiologist: Dr. Herbie Baltimore Primary Electrophysiologist:  None   Discharge Diagnoses    Principal Problem:   Acute ST elevation myocardial infarction (STEMI) of anteroseptal wall [facility]) Active Problems:   CAD S/P percutaneous coronary angioplasty   Type II diabetes mellitus with neurological manifestations, uncontrolled (HCC)   Hyperlipidemia with target LDL less than 70   Tobacco abuse   Hypertension   UTI (urinary tract infection)    Diagnostic Studies/Procedures    Left Heart Catheterization 11/23/2019:  Previously placed Mid LAD to Dist LAD stent (unknown type) is widely patent.  Mid LAD lesion is 30% stenosed.  CULPRIT LESION: Dist LAD-1 lesion is 100% stenosed with 100% stenosed side branch in 3rd Diag.  Unsuccessful balloon angioplasty was performed using a BALLOON SAPPHIRE 2.0X12 -unable to advance into the distal vessel.  Post intervention, there is a 70% residual stenosis followed by Dist LAD-2 lesion is 99% stenosed. Post intervention, the side branch was remained at 100% residual stenosis.  --------------------------  RPDA lesion is 80% stenosed-tapers into very small vessel..  LV end diastolic pressure is severely elevated. Therefore no the ventriculogram was performed.  There is no aortic valve stenosis.  Diagnostic Dominance: Right  Intervention    _____________   Echocardiogram 11/24/2019: Impressions: 1. Left ventricular ejection fraction, by estimation, is 45 to 50%. The  left ventricle has mildly decreased function. The left ventricle has no  regional wall motion abnormalities. Left ventricular diastolic parameters  are consistent with Grade I  diastolic dysfunction (impaired relaxation). Elevated left atrial  pressure.  2. Right ventricular systolic  function is normal. The right ventricular  size is normal. There is normal pulmonary artery systolic pressure.  3. The mitral valve is normal in structure. No evidence of mitral valve  regurgitation. No evidence of mitral stenosis.  4. The aortic valve is tricuspid. Aortic valve regurgitation is moderate  to severe. Mild to moderate aortic valve sclerosis/calcification is  present, without any evidence of aortic stenosis.  5. The inferior vena cava is normal in size with greater than 50%  respiratory variability, suggesting right atrial pressure of 3 mmHg.  History of Present Illness     Maria Harper is a 49 year old female with a history of CAD with self-reported MI in 04/2016 in [employer], [place]; hypertension; hyperlipidemia; diabetes mellitus; GERD; and cocaine abuse who presented on 11/23/2019 with anterior STEMI.    Patient was in a [facility] ED in 03/2018 with chest pain. Echo showed preserved LVEF with mild to moderate AI. Exercise Myoview at that time was normal.   Patient was in her usual state of health until roughly 8:00am on 4/23 when she was walking up a hill and began having 8/10 chest pain. It did not relieve with rest. EMS was contacted and upon arrival was found to have 4 mm ST elevations in V2 and V3 with 2 mm in V4. Minimal ST segment changes in lead I, aVF, and V5-V6. Code STEMI called. Patient came into the emergency room at [facility]. After initially refusing, agreed to have Covid swab. She was taken to the cath lab for emergent cardiac catheterization.    Hospital Course     Consultants: Diabetes Coordinator    Acute STEMI  High-sensitivity troponin peaked at 10,158. Left cardiac catheterization showed 100% stenosis of distal LAD-1 and 100% stenosis  of side branch in 3rd Diag. Unfortunately unsuccessful balloon angioplasty as unable to advance into distal vessel. Post-intervention, there was 70% residual stenosis followed by a 99% distal LAD-2 lesion.  Post-intervention, side branch remained at 100% residual stenosis. Previously placed mid to distal LAD stent was widely patent. LVEDP severely elevated. Echo showed LVEF of 45-50% with normal wall motion, grade 1 diastolic dysfunction, moderate to severe AI. Patient continued to have some mild residual chest discomfort following PCI and it was unclear whether there was a mild post-infarct pericarditis component; however, pain improved with Imdur and Coreg. Cath site stable. - Continue dual antiplatelet therapy with Aspirin 81mg  daily and Brilinta 90mg  twice daily. - Home Metoprolol changed to Coreg and this was uptitrated. Will discharge on Coreg 18.75mg  twice daily. Also started on Losartan 25mg  daily and Imdur 30mg  daily. - Continue Lipitor 80mg  daily. - Will need repeat BMET at follow-up.  Hypertension  BP well controlled.  - Continue Losartan 25mg  daily, Coreg 18.75mg  twice daily, Imdur 30mg  daily, Clonidine 0.1mg  twice daily.  Hyperlipidemia  Lipid panel this admission: Total Cholesterol 176, Triglycerides 94, HDL 36, LDL 121. LDL goal <70 given CAD. Concern for non-compliance as patient already on high-intensity statin at home.  - Continue Lipitor 80mg  daily.  - May need to consider adding Zetia or PCSK9 inhibitor as outpatient.   Type 2 Diabetes Mellitus   Hemoglobin A1c 9.6% this admission. Still poorly controlled but improved from 13.7 in 09/2019.  - Restart home medications at discharge.  Tobacco Abuse  - Complete cessation strongly encouraged and discussed at length throughout admission.  - Started on Nicotine patch this admission and will be prescribed at discharge.  History of Cocaine Abuse  - Strongly advised against future use due to the potential for lethal complications throughout admission.  UTI - Curine culture positive for klebsiella pneumoniae. - Continue Keflex 500mg  every 6 hours x5 days. Scheduled to complete 5 day course tomorrow. - Follow-up with  PCP.   Patient seen and examined by Dr. Claiborne Billings and determined to be stable for discharge. Will arrange outpatient follow-up. Medications as below.    Did the patient have an acute coronary syndrome (MI, NSTEMI, STEMI, etc) this admission?:  Yes                               AHA/ACC Clinical Performance & Quality Measures: 1. Aspirin prescribed? - Yes 2. ADP Receptor Inhibitor (Plavix/Clopidogrel, Brilinta/Ticagrelor or Effient/Prasugrel) prescribed (includes medically managed patients)? - Yes 3. Beta Blocker prescribed? - Yes 4. High Intensity Statin (Lipitor 40-80mg  or Crestor 20-40mg ) prescribed? - Yes 5. EF assessed during THIS hospitalization? - Yes 6. For EF <40%, was ACEI/ARB prescribed? - Not Applicable (EF >/= 50%) 7. For EF <40%, Aldosterone Antagonist (Spironolactone or Eplerenone) prescribed? - Not Applicable (EF >/= 09%) 8. Cardiac Rehab Phase II ordered (Included Medically managed Patients)? - Yes   _____________  Discharge Vitals Blood pressure 119/88, pulse 95, temperature 98.3 F (36.8 C), temperature source Oral, resp. rate 20, height 5\' 2"  (1.575 m), weight 81.6 kg, last menstrual period 11/20/2019, SpO2 97 %.  Filed Weights   11/23/19 1255  Weight: 81.6 kg    Labs & Radiologic Studies    CBC Recent Labs    11/27/19 0209 11/28/19 0247  WBC 9.1 8.6  HGB 10.1* 10.4*  HCT 31.8* 32.2*  MCV 90.6 92.0  PLT 215 381   Basic Metabolic Panel Recent Labs  11/27/19 0209 11/28/19 0247  NA 132* 136  K 3.7 4.0  CL 103 104  CO2 22 21*  GLUCOSE 269* 232*  BUN 12 9  CREATININE 0.97 1.05*  CALCIUM 8.4* 9.1   Liver Function Tests No results for input(s): AST, ALT, ALKPHOS, BILITOT, PROT, ALBUMIN in the last 72 hours. No results for input(s): LIPASE, AMYLASE in the last 72 hours. High Sensitivity Troponin:   Recent Labs  Lab 11/23/19 1113 11/23/19 1737 11/23/19 1915 11/23/19 2159 11/23/19 2329  TROPONINIHS 24* 4,773* 5,913* 9,370* 10,158*     BNP Invalid input(s): POCBNP D-Dimer No results for input(s): DDIMER in the last 72 hours. Hemoglobin A1C No results for input(s): HGBA1C in the last 72 hours. Fasting Lipid Panel No results for input(s): CHOL, HDL, LDLCALC, TRIG, CHOLHDL, LDLDIRECT in the last 72 hours. Thyroid Function Tests No results for input(s): TSH, T4TOTAL, T3FREE, THYROIDAB in the last 72 hours.  Invalid input(s): FREET3 _____________  CARDIAC CATHETERIZATION  Result Date: 11/23/2019  Previously placed Mid LAD to Dist LAD stent (unknown type) is widely patent.  Mid LAD lesion is 30% stenosed.  CULPRIT LESION: Dist LAD-1 lesion is 100% stenosed with 100% stenosed side branch in 3rd Diag.  Unsuccessful balloon angioplasty was performed using a BALLOON SAPPHIRE 2.0X12 -unable to advance into the distal vessel.  Post intervention, there is a 70% residual stenosis followed by Dist LAD-2 lesion is 99% stenosed. Post intervention, the side branch was remained at 100% residual stenosis.  --------------------------  RPDA lesion is 80% stenosed-tapers into very small vessel..  LV end diastolic pressure is severely elevated. Therefore no the ventriculogram was performed.  There is no aortic valve stenosis.    ECHOCARDIOGRAM COMPLETE  Result Date: 11/24/2019    ECHOCARDIOGRAM REPORT   Patient Name:   Maria Harper Date of Exam: 11/24/2019 Medical Rec #:  [id number]        Height:       62.0 in Accession #:    [id number]       Weight:       180.0 lb Date of Birth:  [birth year]        BSA:          1.828 m Patient Age:    48 years         BP:           123/72 mmHg Patient Gender: F                HR:           82 bpm. Exam Location:  Inpatient Procedure: 2D Echo, Cardiac Doppler, Color Doppler and Intracardiac            Opacification Agent Indications:    CAD Native Vessel 414.01 / I25.10  History:        Patient has no prior history of Echocardiogram examinations.                 CAD, Arrythmias:STEMI; Risk Factors:Current  Smoker.  Sonographer:    Renella Cunas RDCS Referring Phys: [id number] DAVID W HARDING IMPRESSIONS  1. Left ventricular ejection fraction, by estimation, is 45 to 50%. The left ventricle has mildly decreased function. The left ventricle has no regional wall motion abnormalities. Left ventricular diastolic parameters are consistent with Grade I diastolic dysfunction (impaired relaxation). Elevated left atrial pressure.  2. Right ventricular systolic function is normal. The right ventricular size is normal. There is normal pulmonary artery systolic pressure.  3. The mitral valve is  normal in structure. No evidence of mitral valve regurgitation. No evidence of mitral stenosis.  4. The aortic valve is tricuspid. Aortic valve regurgitation is moderate to severe. Mild to moderate aortic valve sclerosis/calcification is present, without any evidence of aortic stenosis.  5. The inferior vena cava is normal in size with greater than 50% respiratory variability, suggesting right atrial pressure of 3 mmHg. FINDINGS  Left Ventricle: Left ventricular ejection fraction, by estimation, is 45 to 50%. The left ventricle has mildly decreased function. The left ventricle has no regional wall motion abnormalities. Definity contrast agent was given IV to delineate the left ventricular endocardial borders. The left ventricular internal cavity size was normal in size. There is no left ventricular hypertrophy. Left ventricular diastolic parameters are consistent with Grade I diastolic dysfunction (impaired relaxation). Elevated left atrial pressure.  LV Wall Scoring: The apical septal segment, apical inferior segment, and apex are akinetic. Right Ventricle: The right ventricular size is normal. No increase in right ventricular wall thickness. Right ventricular systolic function is normal. There is normal pulmonary artery systolic pressure. The tricuspid regurgitant velocity is 2.15 m/s, and  with an assumed right atrial pressure of 8 mmHg, the  estimated right ventricular systolic pressure is 26.5 mmHg. Left Atrium: Left atrial size was normal in size. Right Atrium: Right atrial size was normal in size. Pericardium: There is no evidence of pericardial effusion. Mitral Valve: The mitral valve is normal in structure. Normal mobility of the mitral valve leaflets. No evidence of mitral valve regurgitation. No evidence of mitral valve stenosis. Tricuspid Valve: The tricuspid valve is normal in structure. Tricuspid valve regurgitation is trivial. No evidence of tricuspid stenosis. Aortic Valve: The aortic valve is tricuspid. Aortic valve regurgitation is moderate to severe. Aortic regurgitation PHT measures 584 msec. Mild to moderate aortic valve sclerosis/calcification is present, without any evidence of aortic stenosis. Pulmonic Valve: The pulmonic valve was normal in structure. Pulmonic valve regurgitation is not visualized. No evidence of pulmonic stenosis. Aorta: The aortic root is normal in size and structure. Venous: The inferior vena cava is normal in size with greater than 50% respiratory variability, suggesting right atrial pressure of 3 mmHg. IAS/Shunts: No atrial level shunt detected by color flow Doppler.  LEFT VENTRICLE PLAX 2D LVIDd:         4.40 cm      Diastology LVIDs:         3.40 cm      LV e' lateral:   3.49 cm/s LV PW:         1.00 cm      LV E/e' lateral: 26.0 LV IVS:        0.80 cm      LV e' medial:    4.05 cm/s LVOT diam:     1.70 cm      LV E/e' medial:  22.4 LV SV:         39 LV SV Index:   22 LVOT Area:     2.27 cm  LV Volumes (MOD) LV vol d, MOD A2C: 116.0 ml LV vol d, MOD A4C: 159.0 ml LV vol s, MOD A2C: 67.6 ml LV vol s, MOD A4C: 89.5 ml LV SV MOD A2C:     48.4 ml LV SV MOD A4C:     159.0 ml LV SV MOD BP:      58.1 ml RIGHT VENTRICLE RV S prime:     12.60 cm/s TAPSE (M-mode): 1.7 cm LEFT ATRIUM  Index       RIGHT ATRIUM          Index LA diam:        3.80 cm 2.08 cm/m  RA Area:     9.23 cm LA Vol (A2C):   25.8 ml  14.11 ml/m RA Volume:   15.40 ml 8.42 ml/m LA Vol (A4C):   32.9 ml 18.00 ml/m LA Biplane Vol: 29.2 ml 15.97 ml/m  AORTIC VALVE LVOT Vmax:   94.60 cm/s LVOT Vmean:  60.000 cm/s LVOT VTI:    0.174 m AI PHT:      584 msec  AORTA Ao Root diam: 2.50 cm MITRAL VALVE                TRICUSPID VALVE MV Area (PHT): 3.65 cm     TR Peak grad:   18.5 mmHg MV Decel Time: 208 msec     TR Vmax:        215.00 cm/s MV E velocity: 90.80 cm/s MV A velocity: 106.00 cm/s  SHUNTS MV E/A ratio:  0.86         Systemic VTI:  0.17 m                             Systemic Diam: 1.70 cm Donato Schultz MD Electronically signed by Donato Schultz MD Signature Date/Time: 11/24/2019/3:11:49 PM    Final    Disposition   Patient is being discharged home today in good condition.  Follow-up Plans & Appointments    Follow-up Information    Placey, Chales Abrahams, NP Follow up.   Why: please call and get appt Contact information: [address] [place] [address] [phone]        Ronney Asters, NP Follow up.   Specialty: Cardiology Why: Hospital follow-up scheduled for 12/07/2019 at 10:45am. Please arrive 15 minutes early for check-in. If this date/time does not work for you, please call our office to reschedule. Contact information: [address] [place] [address] [phone]          Discharge Instructions    Amb Referral to Cardiac Rehabilitation   Complete by: As directed    Diagnosis: STEMI   After initial evaluation and assessments completed: Virtual Based Care may be provided alone or in conjunction with Phase 2 Cardiac Rehab based on patient barriers.: Yes   Diet - low sodium heart healthy   Complete by: As directed    Increase activity slowly   Complete by: As directed       Discharge Medications   Allergies as of 11/28/2019      Reactions   Contrast Media [iodinated Diagnostic Agents] Hives, Itching   Tomato Itching, Rash      Medication List    STOP taking these medications    clopidogrel 75 MG tablet Commonly known as: PLAVIX   ibuprofen 800 MG tablet Commonly known as: ADVIL   metoprolol tartrate 50 MG tablet Commonly known as: LOPRESSOR     TAKE these medications   aspirin 81 MG chewable tablet Chew 81 mg by mouth daily. Notes to patient: 11/29/19 Morning   atorvastatin 80 MG tablet Commonly known as: LIPITOR Take 80 mg by mouth daily at 6 PM. Notes to patient: 11/28/19 6 pm   carvedilol 12.5 MG tablet Commonly known as: COREG Take 1.5 tablets (18.75 mg total) by mouth 2 (two) times daily with a meal.   cephALEXin 500 MG capsule  Commonly known as: KEFLEX Take 1 capsule (500 mg total) by mouth every 6 (six) hours.   cloNIDine 0.1 MG tablet Commonly known as: CATAPRES Take 0.1 mg by mouth 2 (two) times daily. Notes to patient: 11/28/19 9 PM   cyclobenzaprine 10 MG tablet Commonly known as: FLEXERIL Take 10 mg by mouth 3 (three) times daily as needed for muscle spasms.   gabapentin 300 MG capsule Commonly known as: NEURONTIN Take 300 mg by mouth 3 (three) times daily. Notes to patient: 11/28/19 5 Pm   glipiZIDE 5 MG tablet Commonly known as: GLUCOTROL Take 5 mg by mouth 2 (two) times daily before a meal. Notes to patient: 11/28/19 6 pm   HumuLIN 70/30 (70-30) 100 UNIT/ML injection Generic drug: insulin NPH-regular Human Inject 35 Units into the skin 2 (two) times daily. Notes to patient: 11/28/19 5 pm   HumuLIN R 100 units/mL injection Generic drug: insulin regular Inject 17 Units into the skin 2 (two) times daily before a meal.   isosorbide mononitrate 30 MG 24 hr tablet Commonly known as: IMDUR Take 1 tablet (30 mg total) by mouth daily. Start taking on: November 29, 2019   losartan 25 MG tablet Commonly known as: COZAAR Take 1 tablet (25 mg total) by mouth daily. Start taking on: November 29, 2019   montelukast 10 MG tablet Commonly known as: SINGULAIR Take 10 mg by mouth at bedtime. Notes to patient: 11/28/19 9 PM   nicotine  14 mg/24hr patch Commonly known as: NICODERM CQ - dosed in mg/24 hours Place 1 patch (14 mg total) onto the skin daily. Start taking on: November 29, 2019   nitroGLYCERIN 0.4 MG SL tablet Commonly known as: NITROSTAT Place 1 tablet (0.4 mg total) under the tongue as needed for chest pain.   pantoprazole 40 MG tablet Commonly known as: PROTONIX Take 40 mg by mouth 2 (two) times daily. Notes to patient: 11/28/19 9 PM   polyethylene glycol powder 17 GM/SCOOP powder Commonly known as: GLYCOLAX/MIRALAX Take 17 g by mouth daily as needed for moderate constipation.   QUEtiapine 200 MG tablet Commonly known as: SEROQUEL Take 200 mg by mouth every morning. Notes to patient: 11/29/19 Morning   QUEtiapine 400 MG tablet Commonly known as: SEROQUEL Take 400 mg by mouth at bedtime. Notes to patient: 11/28/19 Bedtime   ticagrelor 90 MG Tabs tablet Commonly known as: BRILINTA Take 1 tablet (90 mg total) by mouth 2 (two) times daily.   Ventolin HFA 108 (90 Base) MCG/ACT inhaler Generic drug: albuterol Inhale 2 puffs into the lungs every 6 (six) hours as needed for wheezing or shortness of breath.          Outstanding Labs/Studies   Will need BMET at follow-up.  Duration of Discharge Encounter   Greater than 30 minutes including physician time.  Signed, Corrin Parker, PA-C 11/28/2019, 4:17 PM

## 2019-11-28 NOTE — Discharge Instructions (Signed)
Medication Changes: - STOP Plavix and START Brilinta 90mg  twice daily. CONTINUE Aspirin 81mg  daily.  - STOP Metoprolol and START Carvedilol (Coreg) 18.75mg  twice daily. - START Losartan 25mg  daily. - START Imdur 30mg  daily. - FINISH 5 day course of antibiotics (Keflex) for your UTI. - STOP Ibuprofen as this is not good for your heart. Recommend using Tylenol instead for pain. If Tylenol does not work for you, recommend discussing other options with primary care provider. - CONTINUE home Insulin regimen.  Please take all other medications as directed elsewhere on discharge paperwork.  Post STEMI: NO HEAVY LIFTING X 4 WEEKS. NO SEXUAL ACTIVITY X 4 WEEKS. NO DRIVING X 2 WEEKS. NO SOAKING BATHS, HOT TUBS, POOLS, ETC., X 7 DAYS.  Radial Site Care: Refer to this sheet in the next few weeks. These instructions provide you with information on caring for yourself after your procedure. Your caregiver may also give you more specific instructions. Your treatment has been planned according to current medical practices, but problems sometimes occur. Call your caregiver if you have any problems or questions after your procedure. HOME CARE INSTRUCTIONS  You may shower the day after the procedure.Remove the bandage (dressing) and gently wash the site with plain soap and water.Gently pat the site dry.   Do not apply powder or lotion to the site.   Do not submerge the affected site in water for 3 to 5 days.   Inspect the site at least twice daily.   Do not flex or bend the affected arm for 24 hours.   No lifting over 5 pounds (2.3 kg) for 5 days after your procedure.   Do not drive home if you are discharged the same day of the procedure. Have someone else drive you.  What to expect:  Any bruising will usually fade within 1 to 2 weeks.   Blood that collects in the tissue (hematoma) may be painful to the touch. It should usually decrease in size and tenderness within 1 to 2 weeks.  SEEK  IMMEDIATE MEDICAL CARE IF:  You have unusual pain at the radial site.   You have redness, warmth, swelling, or pain at the radial site.   You have drainage (other than a small amount of blood on the dressing).   You have chills.   You have a fever or persistent symptoms for more than 72 hours.   You have a fever and your symptoms suddenly get worse.   Your arm becomes pale, cool, tingly, or numb.   You have heavy bleeding from the site. Hold pressure on the site.   Groin Site Care: Refer to this sheet in the next few weeks. These instructions provide you with information on caring for yourself after your procedure. Your caregiver may also give you more specific instructions. Your treatment has been planned according to current medical practices, but problems sometimes occur. Call your caregiver if you have any problems or questions after your procedure. HOME CARE INSTRUCTIONS  You may shower 24 hours after the procedure. Remove the bandage (dressing) and gently wash the site with plain soap and water. Gently pat the site dry.   Do not apply powder or lotion to the site.   Do not sit in a bathtub, swimming pool, or whirlpool for 5 to 7 days.   No bending, squatting, or lifting anything over 10 pounds (4.5 kg) as directed by your caregiver.   Inspect the site at least twice daily.   Do not drive home if you  are discharged the same day of the procedure. Have someone else drive you.  What to expect:  Any bruising will usually fade within 1 to 2 weeks.   Blood that collects in the tissue (hematoma) may be painful to the touch. It should usually decrease in size and tenderness within 1 to 2 weeks.  SEEK IMMEDIATE MEDICAL CARE IF:  You have unusual pain at the groin site or down the affected leg.   You have redness, warmth, swelling, or pain at the groin site.   You have drainage (other than a small amount of blood on the dressing).   You have chills.   You have a fever or  persistent symptoms for more than 72 hours.   You have a fever and your symptoms suddenly get worse.   Your leg becomes pale, cool, tingly, or numb.   You have heavy bleeding from the site. Hold pressure on the site.

## 2019-11-29 ENCOUNTER — Telehealth (HOSPITAL_COMMUNITY): Payer: Self-pay

## 2019-11-29 NOTE — Telephone Encounter (Signed)
Pt Medicaid is pending. 

## 2019-11-29 NOTE — Telephone Encounter (Signed)
Attempted to contact pt in regards to CR, called the number on file someone answered and stated I had the wrong number. Also contacted the phone number on the white card, it was disconnected. Will attempt again after follow up appt

## 2019-12-06 NOTE — Progress Notes (Deleted)
Cardiology Clinic Note   Patient Name: Maria Harper Date of Encounter: 12/06/2019  Primary Care Provider:  Lavinia Sharps, NP Primary Cardiologist:  Maria Lemma, MD  Patient Profile    Maria Harper 49 year old female presents today for a follow-up after her STEMI.  (Anterior septal wall with PCA to LAD)  Past Medical History    Past Medical History:  Diagnosis Date  . Acid reflux   . Cocaine abuse (HCC)   . Diabetes mellitus without complication (HCC)   . Heart attack (HCC) 04/2016  . High cholesterol   . Hypertension    Past Surgical History:  Procedure Laterality Date  . CORONARY/GRAFT ACUTE MI REVASCULARIZATION N/A 11/23/2019   Procedure: CORONARY/GRAFT ACUTE MI REVASCULARIZATION;  Surgeon: [name], MD;  Location: [facility] INVASIVE CV LAB;  Service: Cardiovascular;  Laterality: N/A;  . LEFT HEART CATH AND CORONARY ANGIOGRAPHY N/A 11/23/2019   Procedure: LEFT HEART CATH AND CORONARY ANGIOGRAPHY;  Surgeon: [name], MD;  Location: [facility] INVASIVE CV LAB;  Service: Cardiovascular;  Laterality: N/A;    Allergies  Allergies  Allergen Reactions  . Contrast Media [Iodinated Diagnostic Agents] Hives and Itching  . Tomato Itching and Rash    History of Present Illness    Maria Harper has a past medical history of coronary artery disease with a self-reported myocardial infarction in 2017 in [place].  She also has HTN, HLD, diabetes mellitus, GERD, and a history of cocaine abuse.  She was seen and evaluated at [facility] emergency department 8/19 with chest pain.  Her echocardiogram showed normal EF and mild to moderate AI.  Her exercise Myoview was normal at that time.  She presented to the emergency department on 11/23/2019 with anterior STEMI.  At 8 AM on 11/23/2019 she indicated she was walking up a hill and began having 8 out of 10 chest pain.  Her pain was not relieved by rest.  EMS was contacted and on arrival she was noted to have 4 mm ST elevation in V2  V3 and 2 mm elevation in V4.  A code STEMI was called.  She presented to the emergency department at [facility].  She initially refused Covid testing but did agree to Covid swab.  She was taken to the cardiac Cath Lab.  Her catheterization showed 100% stenosed distal LAD and 100% stenosed side branch in her third diagonal.  Unfortunately balloon angioplasty was able to advance into her distal vessel.  Post intervention there was a 70% residual stenosis followed by a 99% distal LAD lesion.  Post intervention the side branch remained to 100% residually stenosed.  Her LVEDP was severely elevated.  Her echocardiogram showed an LVEF of 45-50% and normal wall motion, grade 1 DD, moderate to severe AI.  She continued to have some mild residual chest discomfort following her PCI.  It was noted that there may have been a mild post infarct pericarditis component.  However, her pain improved with Imdur and carvedilol.  She was started on aspirin and Brilinta.  Her metoprolol was changed to carvedilol 18.75 mg twice daily.  She was also started on losartan 25 mg and Imdur 30 mg daily.  She presents the clinic today for further evaluation and states***  *** denies chest pain, shortness of breath, lower extremity edema, fatigue, palpitations, melena, hematuria, hemoptysis, diaphoresis, weakness, presyncope, syncope, orthopnea, and PND.   Home Medications    Prior to Admission medications   Medication Sig Start Date End Date Taking? Authorizing Provider  albuterol (VENTOLIN HFA) 108 (90 Base) MCG/ACT inhaler Inhale 2 puffs into the lungs every 6 (six) hours as needed for wheezing or shortness of breath.    [provider]  aspirin 81 MG chewable tablet Chew 81 mg by mouth daily.    [provider]  atorvastatin (LIPITOR) 80 MG tablet Take 80 mg by mouth daily at 6 PM.  04/05/18   [provider]  carvedilol (COREG) 12.5 MG tablet Take 1.5 tablets (18.75 mg total) by mouth 2 (two) times daily  with a meal. 11/28/19   [name] E, PA-C  cephALEXin (KEFLEX) 500 MG capsule Take 1 capsule (500 mg total) by mouth every 6 (six) hours. 11/28/19   [name] E, PA-C  cloNIDine (CATAPRES) 0.1 MG tablet Take 0.1 mg by mouth 2 (two) times daily. 12/20/17   [provider]  cyclobenzaprine (FLEXERIL) 10 MG tablet Take 10 mg by mouth 3 (three) times daily as needed for muscle spasms.     [provider]  gabapentin (NEURONTIN) 300 MG capsule Take 300 mg by mouth 3 (three) times daily.    [provider]  glipiZIDE (GLUCOTROL) 5 MG tablet Take 5 mg by mouth 2 (two) times daily before a meal.  12/20/17   [provider]  HUMULIN R 100 UNIT/ML injection Inject 17 Units into the skin 2 (two) times daily before a meal.  06/13/18   [provider]  insulin NPH-regular Human (HUMULIN 70/30) (70-30) 100 UNIT/ML injection Inject 35 Units into the skin 2 (two) times daily.  07/01/17   [provider]  isosorbide mononitrate (IMDUR) 30 MG 24 hr tablet Take 1 tablet (30 mg total) by mouth daily. 11/29/19   [name] E, PA-C  losartan (COZAAR) 25 MG tablet Take 1 tablet (25 mg total) by mouth daily. 11/29/19   [name] E, PA-C  montelukast (SINGULAIR) 10 MG tablet Take 10 mg by mouth at bedtime.    [provider]  nicotine (NICODERM CQ - DOSED IN MG/24 HOURS) 14 mg/24hr patch Place 1 patch (14 mg total) onto the skin daily. 11/29/19   Darreld Mclean, PA-C  nitroGLYCERIN (NITROSTAT) 0.4 MG SL tablet Place 1 tablet (0.4 mg total) under the tongue as needed for chest pain. 11/28/19   [name] E, PA-C  pantoprazole (PROTONIX) 40 MG tablet Take 40 mg by mouth 2 (two) times daily.     [provider]  polyethylene glycol powder (GLYCOLAX/MIRALAX) powder Take 17 g by mouth daily as needed for moderate constipation.  04/05/18   [provider]  QUEtiapine (SEROQUEL) 200 MG tablet Take 200 mg by mouth every  morning.  04/05/18   [provider]  QUEtiapine (SEROQUEL) 400 MG tablet Take 400 mg by mouth at bedtime. 04/05/18   [provider]  ticagrelor (BRILINTA) 90 MG TABS tablet Take 1 tablet (90 mg total) by mouth 2 (two) times daily. 11/28/19   Darreld Mclean, PA-C    Family History    Family History  Problem Relation Age of Onset  . Diabetes Mother   . Diabetes Father   . Diabetes Maternal Grandmother   . Diabetes Maternal Grandfather    She indicated that the status of her mother is unknown. She indicated that the status of her father is unknown. She indicated that the status of her maternal grandmother is unknown. She indicated that the status of her maternal grandfather is unknown.  Social History    Social History  Socioeconomic History  . Marital status: Married    Spouse name: Not on file  . Number of children: Not on file  . Years of education: Not on file  . Highest education level: Not on file  Occupational History  . Not on file  Tobacco Use  . Smoking status: Current Every Day Smoker    Packs/day: 1.50    Years: 20.00    Pack years: 30.00    Types: Cigarettes  . Smokeless tobacco: Never Used  Substance and Sexual Activity  . Alcohol use: Not Currently  . Drug use: Yes    Types: "Crack" cocaine    Comment: last used yesterday. States " I do it everyday"   . Sexual activity: Not on file  Other Topics Concern  . Not on file  Social History Narrative  . Not on file   Social Determinants of Health   Financial Resource Strain:   . Difficulty of Paying Living Expenses:   Food Insecurity:   . Worried About Programme researcher, broadcasting/film/video in the Last Year:   . Barista in the Last Year:   Transportation Needs:   . Freight forwarder (Medical):   Marland Kitchen Lack of Transportation (Non-Medical):   Physical Activity:   . Days of Exercise per Week:   . Minutes of Exercise per Session:   Stress:   . Feeling of Stress :   Social Connections:   .  Frequency of Communication with Friends and Family:   . Frequency of Social Gatherings with Friends and Family:   . Attends Religious Services:   . Active Member of Clubs or Organizations:   . Attends Banker Meetings:   Marland Kitchen Marital Status:   Intimate Partner Violence:   . Fear of Current or Ex-Partner:   . Emotionally Abused:   Marland Kitchen Physically Abused:   . Sexually Abused:      Review of Systems    General:  No chills, fever, night sweats or weight changes.  Cardiovascular:  No chest pain, dyspnea on exertion, edema, orthopnea, palpitations, paroxysmal nocturnal dyspnea. Dermatological: No rash, lesions/masses Respiratory: No cough, dyspnea Urologic: No hematuria, dysuria Abdominal:   No nausea, vomiting, diarrhea, bright red blood per rectum, melena, or hematemesis Neurologic:  No visual changes, wkns, changes in mental status. All other systems reviewed and are otherwise negative except as noted above.  Physical Exam    VS:  LMP 11/20/2019  , BMI There is no height or weight on file to calculate BMI. GEN: Well nourished, well developed, in no acute distress. HEENT: normal. Neck: Supple, no JVD, carotid bruits, or masses. Cardiac: RRR, no murmurs, rubs, or gallops. No clubbing, cyanosis, edema.  Radials/DP/PT 2+ and equal bilaterally.  Respiratory:  Respirations regular and unlabored, clear to auscultation bilaterally. GI: Soft, nontender, nondistended, BS + x 4. MS: no deformity or atrophy. Skin: warm and dry, no rash. Neuro:  Strength and sensation are intact. Psych: Normal affect.  Accessory Clinical Findings    ECG personally reviewed by me today- *** - No acute changes  EKG 11/26/2019 Normal sinus rhythm possible LAE anterior infarct undetermined age ST wave abnormality consider anteriolateral ischemia 97 bpm  Echocardiogram 11/24/2019 IMPRESSIONS    1. Left ventricular ejection fraction, by estimation, is 45 to 50%. The  left ventricle has mildly  decreased function. The left ventricle has no  regional wall motion abnormalities. Left ventricular diastolic parameters  are consistent with Grade I  diastolic dysfunction (impaired relaxation). Elevated left  atrial  pressure.  2. Right ventricular systolic function is normal. The right ventricular  size is normal. There is normal pulmonary artery systolic pressure.  3. The mitral valve is normal in structure. No evidence of mitral valve  regurgitation. No evidence of mitral stenosis.  4. The aortic valve is tricuspid. Aortic valve regurgitation is moderate  to severe. Mild to moderate aortic valve sclerosis/calcification is  present, without any evidence of aortic stenosis.  5. The inferior vena cava is normal in size with greater than 50%  respiratory variability, suggesting right atrial pressure of 3 mmHg.   Previously placed Mid LAD to Dist LAD stent (unknown type) is widely patent.  Mid LAD lesion is 30% stenosed.  CULPRIT LESION: Dist LAD-1 lesion is 100% stenosed with 100% stenosed side branch in 3rd Diag.  Unsuccessful balloon angioplasty was performed using a BALLOON SAPPHIRE 2.0X12 -unable to advance into the distal vessel.  Post intervention, there is a 70% residual stenosis followed by Dist LAD-2 lesion is 99% stenosed. Post intervention, the side branch was remained at 100% residual stenosis.  --------------------------  RPDA lesion is 80% stenosed-tapers into very small vessel..  LV end diastolic pressure is severely elevated. Therefore no the ventriculogram was performed.  There is no aortic valve stenosis.   Diagnostic Dominance: Right  Intervention     Assessment & Plan   1.  STEMI-no chest pain today.  Anterior MI. Her catheterization 11/23/2019 showed 100% stenosed distal LAD and 100% stenosed side branch in her third diagonal.  Unfortunately balloon angioplasty was able to advance into her distal vessel.  Post intervention there was a 70% residual  stenosis followed by a 99% distal LAD lesion.  Post intervention the side branch remained to 100% residually stenosed.  Continue aspirin, Brilinta, carvedilol, atorvastatin, losartan, and Imdur. Heart healthy low-sodium diet Increase physical activity as tolerated Order BMP  Essential hypertension-BP today*** Continue carvedilol, Imdur, losartan Heart healthy low-sodium diet-salty 6 given Increase physical activity as tolerated   Hyperlipidemia-11/23/2019: Cholesterol 176; HDL 36; LDL Cholesterol 121; Triglycerides 94; VLDL 19 Continue atorvastatin Heart healthy low-sodium high-fiber diet Increase physical activity as tolerated Repeat lipid panel and LFTs in 6 weeks.  Type 2 diabetes-A1c 9.6 on recent admission.  Had improved from 13.7 on 2/21 Continue current medical therapy Followed by PCP  Tobacco abuse/history of cocaine abuse-prescribed nicotine patch at discharge.  Discussed importance of abstaining from tobacco and street drugs.  Disposition: Follow-up with Dr. Herbie Baltimore in 3 months.  Thomasene Ripple. Cailynn Bodnar NP-C    12/06/2019, 6:19 PM [facility] [address] Office [phone] Fax [phone]

## 2019-12-07 ENCOUNTER — Ambulatory Visit: Payer: Self-pay | Admitting: General Practice

## 2019-12-10 ENCOUNTER — Ambulatory Visit: Payer: Self-pay | Admitting: Family Medicine

## 2020-02-06 ENCOUNTER — Emergency Department (HOSPITAL_COMMUNITY): Payer: Self-pay

## 2020-02-06 ENCOUNTER — Encounter (HOSPITAL_COMMUNITY): Payer: Self-pay

## 2020-02-06 ENCOUNTER — Emergency Department (HOSPITAL_COMMUNITY)
Admission: EM | Admit: 2020-02-06 | Discharge: 2020-02-06 | Disposition: A | Discharge status: Home / Self Care | Payer: Self-pay | Attending: Emergency Medicine | Admitting: Emergency Medicine

## 2020-02-06 ENCOUNTER — Other Ambulatory Visit: Payer: Self-pay

## 2020-02-06 DIAGNOSIS — E119 Type 2 diabetes mellitus without complications: Secondary | ICD-10-CM | POA: Insufficient documentation

## 2020-02-06 DIAGNOSIS — R109 Unspecified abdominal pain: Secondary | ICD-10-CM

## 2020-02-06 DIAGNOSIS — R1032 Left lower quadrant pain: Secondary | ICD-10-CM | POA: Insufficient documentation

## 2020-02-06 DIAGNOSIS — I10 Essential (primary) hypertension: Secondary | ICD-10-CM | POA: Insufficient documentation

## 2020-02-06 DIAGNOSIS — Z79899 Other long term (current) drug therapy: Secondary | ICD-10-CM | POA: Insufficient documentation

## 2020-02-06 DIAGNOSIS — N3000 Acute cystitis without hematuria: Secondary | ICD-10-CM | POA: Insufficient documentation

## 2020-02-06 DIAGNOSIS — Z7982 Long term (current) use of aspirin: Secondary | ICD-10-CM | POA: Insufficient documentation

## 2020-02-06 DIAGNOSIS — Z87440 Personal history of urinary (tract) infections: Secondary | ICD-10-CM | POA: Insufficient documentation

## 2020-02-06 DIAGNOSIS — I252 Old myocardial infarction: Secondary | ICD-10-CM | POA: Insufficient documentation

## 2020-02-06 DIAGNOSIS — Z794 Long term (current) use of insulin: Secondary | ICD-10-CM | POA: Insufficient documentation

## 2020-02-06 LAB — CBC
HCT: 36.6 % (ref 36.0–46.0)
Hemoglobin: 11.7 g/dL — ABNORMAL LOW (ref 12.0–15.0)
MCH: 31.1 pg (ref 26.0–34.0)
MCHC: 32 g/dL (ref 30.0–36.0)
MCV: 97.3 fL (ref 80.0–100.0)
Platelets: 161 10*3/uL (ref 150–400)
RBC: 3.76 MIL/uL — ABNORMAL LOW (ref 3.87–5.11)
RDW: 15.7 % — ABNORMAL HIGH (ref 11.5–15.5)
WBC: 13 10*3/uL — ABNORMAL HIGH (ref 4.0–10.5)
nRBC: 0 % (ref 0.0–0.2)

## 2020-02-06 LAB — URINALYSIS, ROUTINE W REFLEX MICROSCOPIC
Bilirubin Urine: NEGATIVE
Glucose, UA: NEGATIVE mg/dL
Ketones, ur: NEGATIVE mg/dL
Nitrite: NEGATIVE
Protein, ur: 30 mg/dL — AB
Specific Gravity, Urine: 1.02 (ref 1.005–1.030)
pH: 6 (ref 5.0–8.0)

## 2020-02-06 LAB — COMPREHENSIVE METABOLIC PANEL
ALT: 13 U/L (ref 0–44)
AST: 13 U/L — ABNORMAL LOW (ref 15–41)
Albumin: 3.4 g/dL — ABNORMAL LOW (ref 3.5–5.0)
Alkaline Phosphatase: 68 U/L (ref 38–126)
Anion gap: 12 (ref 5–15)
BUN: 8 mg/dL (ref 6–20)
CO2: 23 mmol/L (ref 22–32)
Calcium: 8.9 mg/dL (ref 8.9–10.3)
Chloride: 102 mmol/L (ref 98–111)
Creatinine, Ser: 0.87 mg/dL (ref 0.44–1.00)
GFR calc Af Amer: >60 mL/min (ref >60)
GFR calc non Af Amer: >60 mL/min (ref >60)
Glucose, Bld: 123 mg/dL — ABNORMAL HIGH (ref 70–99)
Potassium: 3.7 mmol/L (ref 3.5–5.1)
Sodium: 137 mmol/L (ref 135–145)
Total Bilirubin: 0.2 mg/dL — ABNORMAL LOW (ref 0.3–1.2)
Total Protein: 7.9 g/dL (ref 6.5–8.1)

## 2020-02-06 LAB — TROPONIN I (HIGH SENSITIVITY)
Troponin I (High Sensitivity): 10 ng/L (ref <18)
Troponin I (High Sensitivity): 13 ng/L (ref <18)

## 2020-02-06 LAB — I-STAT BETA HCG BLOOD, ED (MC, WL, AP ONLY): I-stat hCG, quantitative: <5 m[IU]/mL (ref <5)

## 2020-02-06 LAB — LIPASE, BLOOD: Lipase: 18 U/L (ref 11–51)

## 2020-02-06 IMAGING — CT CT RENAL STONE PROTOCOL
2 of 4 series · 16 of 46 positions shown, 18 images · non-contrast
Comparison: [DATE]

CLINICAL DATA: Left-sided flank pain for 2 weeks.

EXAM:
CT ABDOMEN AND PELVIS WITHOUT CONTRAST
TECHNIQUE: Multidetector CT imaging of the abdomen and pelvis was performed
following the standard protocol without IV contrast.

[Series 3: renal stone 5.0 · axial · 0.77mm/px · z∈[+829,+1234]mm · 13 of 89 slices shown, 15 images]
[im 4/89  soft-tissue]
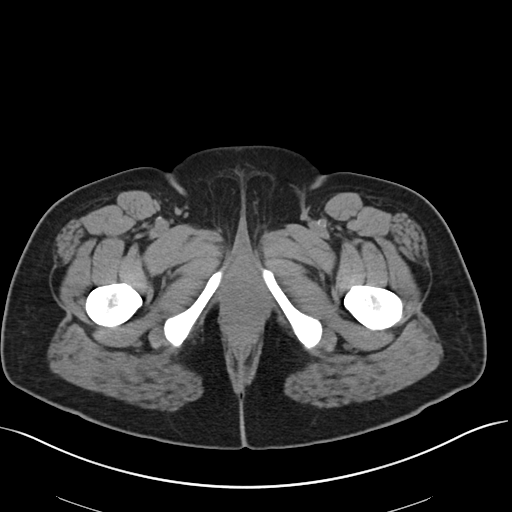
[im 4/89  bone]
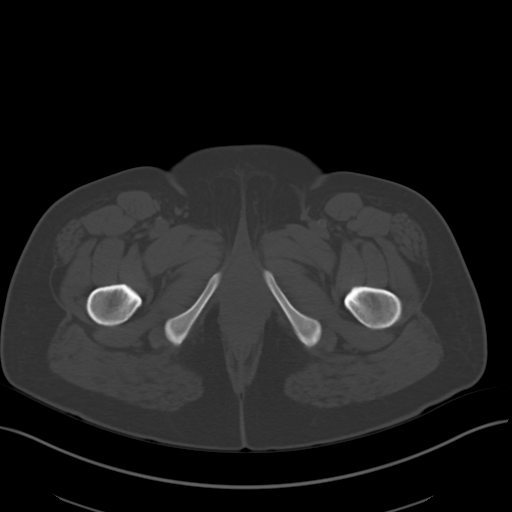
[im 11/89  soft-tissue]
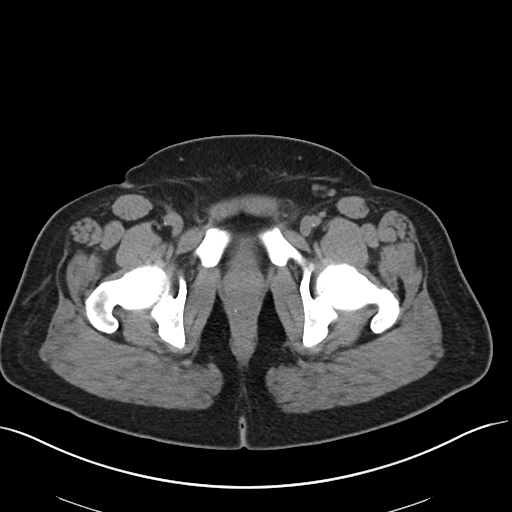
[im 18/89  soft-tissue]
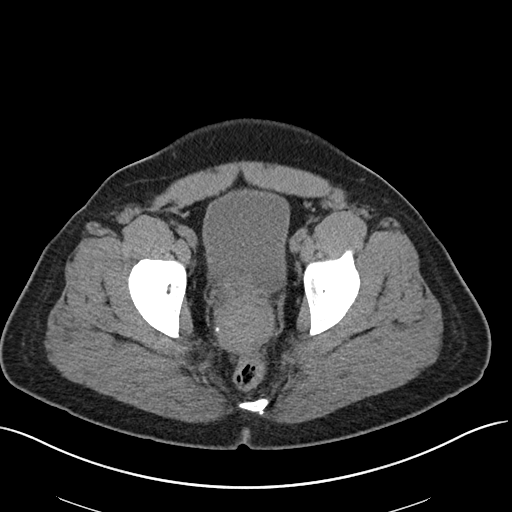
[im 25/89  soft-tissue]
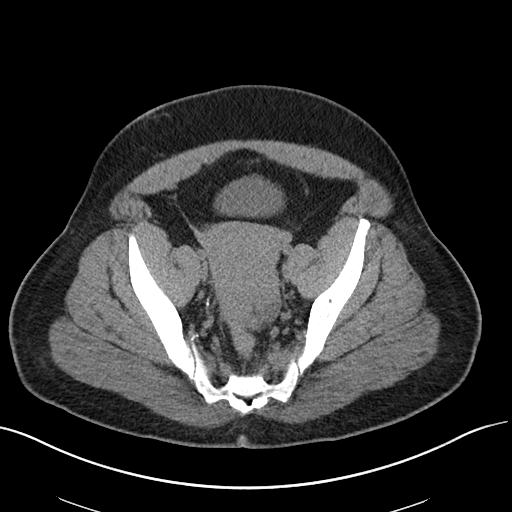
[im 32/89  soft-tissue]
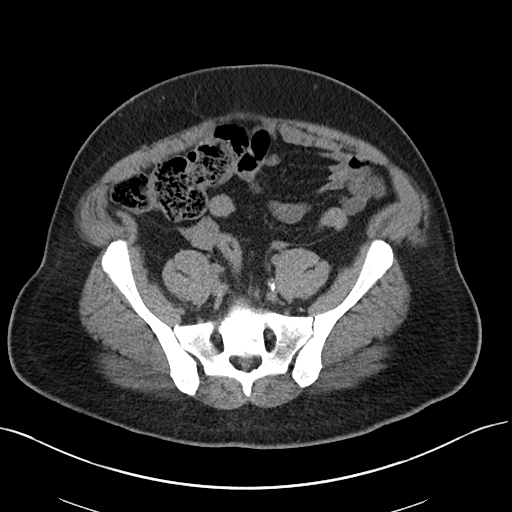
[im 39/89  soft-tissue]
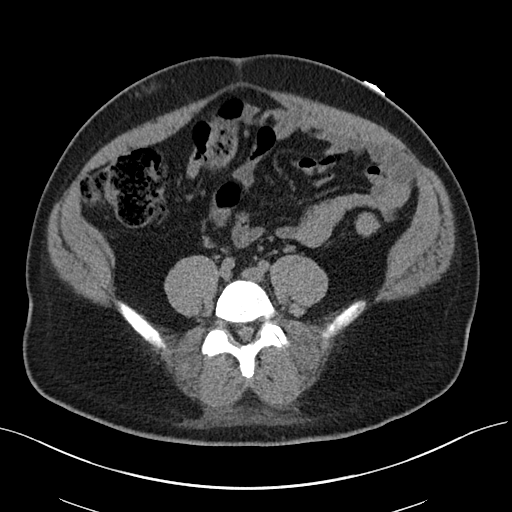
[im 46/89  soft-tissue]
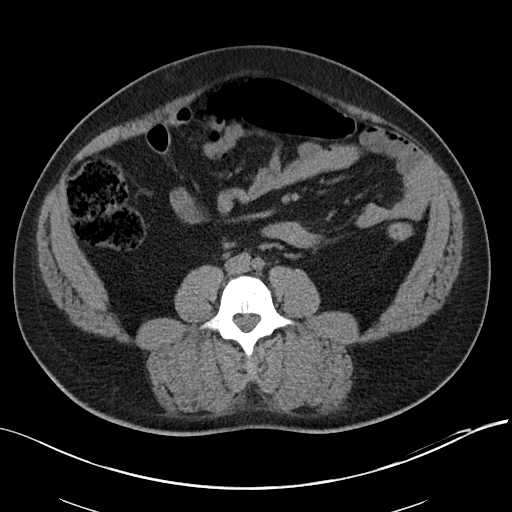
[im 50/89  soft-tissue]
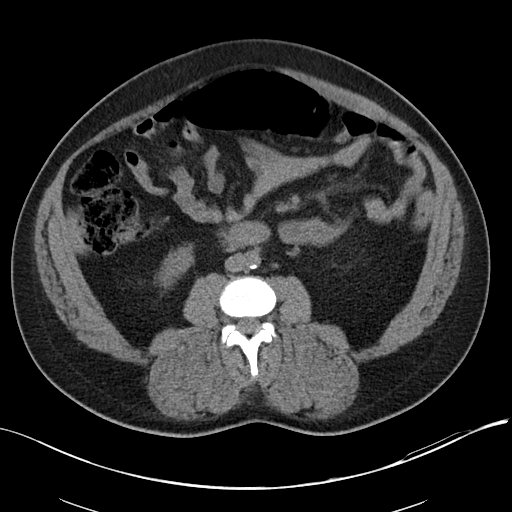
[im 57/89  soft-tissue]
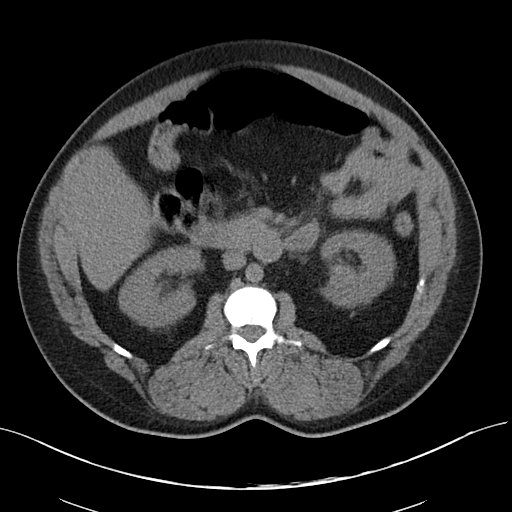
[im 57/89  bone]
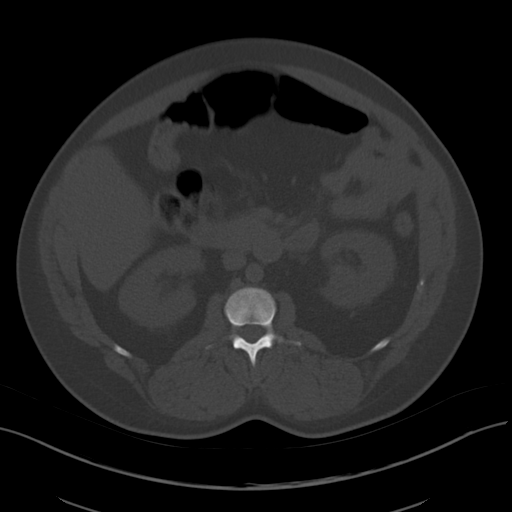
[im 64/89  soft-tissue]
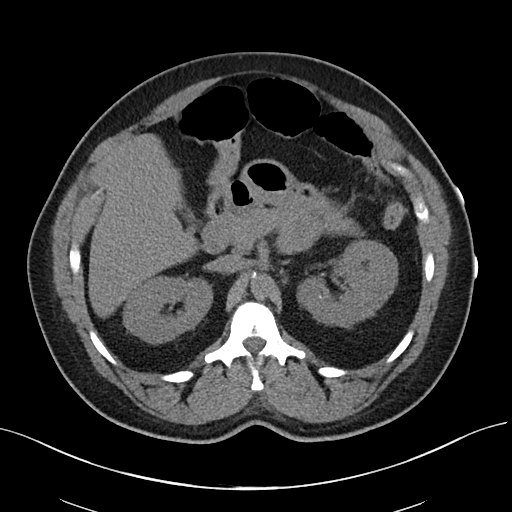
[im 71/89  soft-tissue]
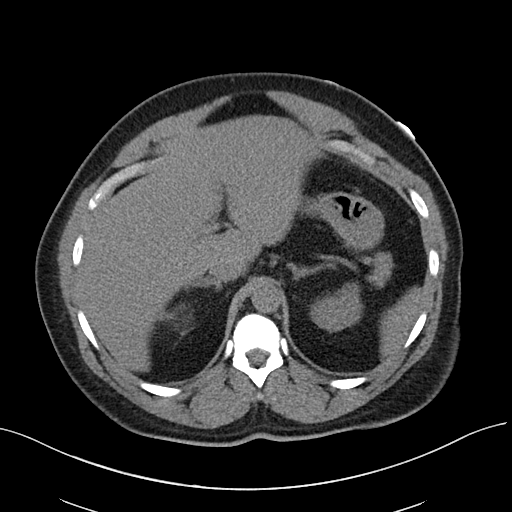
[im 78/89  soft-tissue]
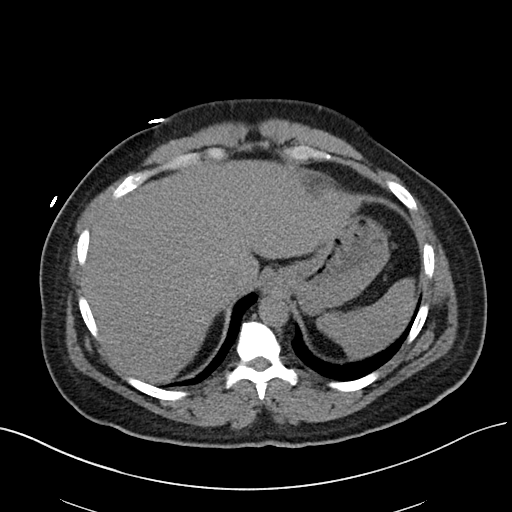
[im 85/89  soft-tissue]
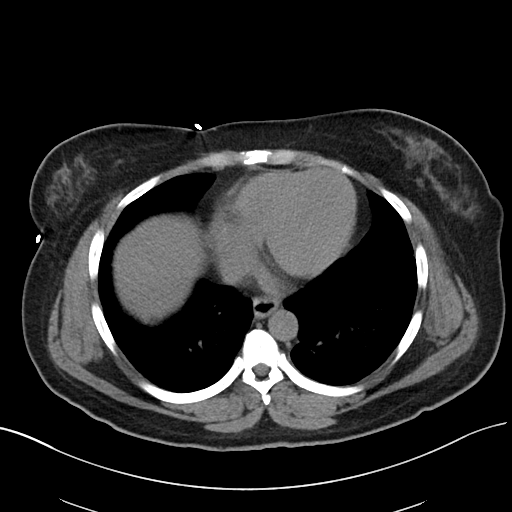

[Series 6: coronal · coronal · 0.78mm/px · 3 of 107 slices shown]
[im 36/107  soft-tissue]
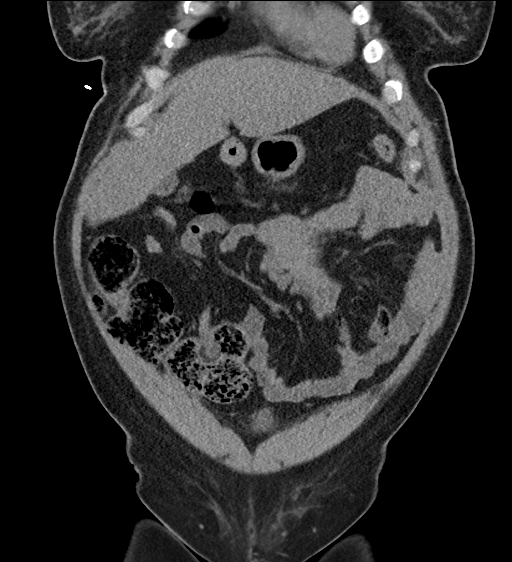
[im 48/107  soft-tissue]
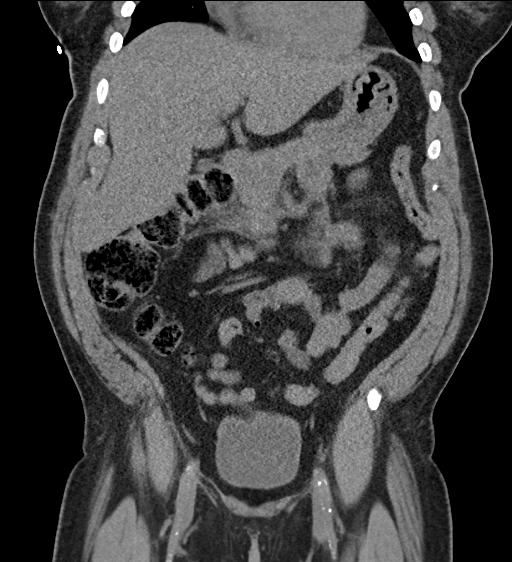
[im 59/107  soft-tissue]
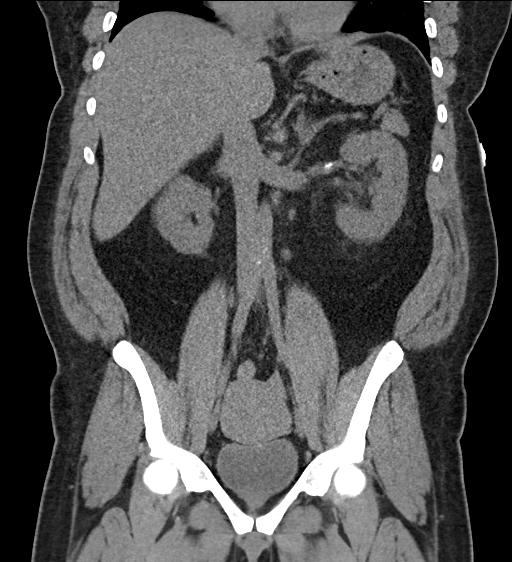

[16 of 46 positions shown; findings below may reference images not displayed]

FINDINGS: Lower chest: Basilar ground-glass opacity may be atelectasis or
residua from the bilateral lower lobe airspace disease seen on the
previous study.

Hepatobiliary: No focal abnormality in the liver on this study
without intravenous contrast. Gallbladder is nondistended. No
intrahepatic or extrahepatic biliary dilation.

Pancreas: No focal mass lesion. No dilatation of the main duct. No
intraparenchymal cyst. No peripancreatic edema.

Spleen: No splenomegaly. No focal mass lesion.

Adrenals/Urinary Tract: No adrenal nodule or mass. Left-sided renal
scarring is similar to prior. No stones are seen in either kidney or
ureter. No bladder stones. No secondary changes in either kidney or
ureter.

Stomach/Bowel: Stomach is unremarkable. No gastric wall thickening.
No evidence of outlet obstruction. Duodenum is normally positioned
as is the ligament of Treitz. No small bowel wall thickening. No
small bowel dilatation. The terminal ileum is normal. The appendix
is best seen on coronal images and is unremarkable. No gross colonic
mass. No colonic wall thickening.

Vascular/Lymphatic: There is abdominal aortic atherosclerosis
without aneurysm. Insert no abdominal lymphatic No pelvic sidewall
lymphadenopathy.

Reproductive: Unremarkable.

Other: No intraperitoneal free fluid.

Musculoskeletal: No worrisome lytic or sclerotic osseous
abnormality.
IMPRESSION: 1. No acute findings in the abdomen or pelvis. Specifically, no
findings to explain the patient's history of left-sided flank pain.
No urinary stone disease. No secondary changes in either kidney or
ureter.
2. Left-sided renal scarring.
3. Aortic Atherosclerosis ([4Y]-[4Y]).

## 2020-02-06 MED ORDER — HYDROCODONE-ACETAMINOPHEN 5-325 MG PO TABS: 1.0000 | ORAL_TABLET | Freq: Four times a day (QID) | ORAL | 0 refills | Status: DC | PRN

## 2020-02-06 MED ORDER — CEPHALEXIN 500 MG PO CAPS
500.0000 mg | ORAL_CAPSULE | Freq: Four times a day (QID) | ORAL | 0 refills | Status: DC
Start: 2020-02-06 — End: 2020-02-06

## 2020-02-06 MED ORDER — ACETAMINOPHEN 325 MG PO TABS
650.0000 mg | ORAL_TABLET | Freq: Once | ORAL | Status: AC
  Administered 2020-02-06: 650 mg via ORAL
  Filled 2020-02-06: qty 2

## 2020-02-06 MED ORDER — CEPHALEXIN 500 MG PO CAPS
500.0000 mg | ORAL_CAPSULE | Freq: Four times a day (QID) | ORAL | 0 refills | Status: DC
Start: 2020-02-06 — End: 2020-03-24

## 2020-02-06 MED ORDER — KETOROLAC TROMETHAMINE 60 MG/2ML IM SOLN
60.0000 mg | Freq: Once | INTRAMUSCULAR | Status: AC
  Administered 2020-02-06: 60 mg via INTRAMUSCULAR
  Filled 2020-02-06: qty 2

## 2020-02-06 MED ORDER — HYDROMORPHONE HCL 1 MG/ML IJ SOLN
2.0000 mg | Freq: Once | INTRAMUSCULAR | Status: AC
  Administered 2020-02-06: 2 mg via INTRAMUSCULAR
  Filled 2020-02-06: qty 2

## 2020-02-06 NOTE — ED Notes (Signed)
Patient verbalizes understanding of discharge instructions. Opportunity for questioning and answers were provided. Armband removed by staff, pt discharged from ED.  

## 2020-02-06 NOTE — Care Management (Signed)
ED CM consulted for medication assistance. Patient is homeless and is being discharged with antibiotics in which she cannot afford. She is uninsured.  Patient is eligible for [facility] program. Re-enrolled  In [facility] program, at patient's request would like prescriptions and MATCH letter sent to [facility] on  [address]

## 2020-02-06 NOTE — ED Notes (Signed)
Patient returned from CT

## 2020-02-06 NOTE — Care Management (Signed)
  MATCH Medication Assistance Card Name: Solara Goodchild ID (MRN): [id number] Bin: [id number] RX Group: [id number] Discharge Date: 02/05/2020 Expiration Date: 02/19/2020                                           (must be filled within 7 days of discharge)      Dear   :  [name]  You have been approved to have the prescriptions written by your discharging physician filled through our [facility] (Medication Assistance Through [facility]) program. This program allows for a one-time (no refills) 34-day supply of selected medications for a low copay amount.  The copay is $3.00 per prescription. For instance, if you have one prescription, you will pay $3.00; for two prescriptions, you pay $6.00; for three prescriptions, you pay $9.00; and so on.  Only certain pharmacies are participating in this program with [facility]. You will need to select one of the pharmacies from the attached list and take your prescriptions, this letter, and your photo ID to one of the participating pharmacies.   We are excited that you are able to use the [facility] program to get your medications. These prescriptions must be filled within 7 days of hospital discharge or they will no longer be valid for the [facility] program. Should you have any problems with your prescriptions please contact your case management team member at [phone] for [place]/ [facility].  Thank you, [facility] Care Management

## 2020-02-06 NOTE — ED Provider Notes (Signed)
[facility] EMERGENCY DEPARTMENT Provider Note   CSN: [id number] Arrival date & time: 02/06/20  7253     History Chief Complaint  Patient presents with  . Flank Pain    Maria Harper is a 49 y.o. female.  Patient is a 49 year old female with history of diabetes, hypertension, recurrent UTIs, and recent MI.  She had a stent placed 2 months ago.  She presents today for evaluation of back pain.  Patient describes pain in her left flank that has worsened over the past 2 weeks and is now spreading to her right flank.  She was given prednisone by her primary doctor which did seem to help, however her primary doctor will not prescribe this any longer.  Patient denies any radiation into her legs.  She denies any bowel complaints, but does describe some foul-smelling urine..  She denies any fevers or chills.  The history is provided by the patient.  Flank Pain This is a new problem. Episode onset: 2 weeks ago. The problem occurs constantly. The problem has been gradually worsening. Pertinent negatives include no abdominal pain. The symptoms are aggravated by twisting. Nothing relieves the symptoms. She has tried nothing for the symptoms.       Past Medical History:  Diagnosis Date  . Acid reflux   . Cocaine abuse (HCC)   . Diabetes mellitus without complication (HCC)   . Heart attack (HCC) 04/2016  . High cholesterol   . Hypertension     Patient Active Problem List   Diagnosis Date Noted  . Hypertension 11/28/2019  . UTI (urinary tract infection) 11/28/2019  . Hyperlipidemia with target LDL less than 70   . Tobacco abuse   . Type II diabetes mellitus with neurological manifestations, uncontrolled (HCC) 11/25/2019  . Acute ST elevation myocardial infarction (STEMI) of anterior wall (HCC) 11/23/2019  . CAD S/P percutaneous coronary angioplasty 11/23/2019  . Acute ST elevation myocardial infarction (STEMI) of anteroseptal wall (HCC) 11/23/2019  . Sepsis (HCC)  09/19/2019  . Cocaine abuse with cocaine-induced mood disorder (HCC) 06/10/2017    Past Surgical History:  Procedure Laterality Date  . CORONARY/GRAFT ACUTE MI REVASCULARIZATION N/A 11/23/2019   Procedure: CORONARY/GRAFT ACUTE MI REVASCULARIZATION;  Surgeon: [name], MD;  Location: [facility] INVASIVE CV LAB;  Service: Cardiovascular;  Laterality: N/A;  . LEFT HEART CATH AND CORONARY ANGIOGRAPHY N/A 11/23/2019   Procedure: LEFT HEART CATH AND CORONARY ANGIOGRAPHY;  Surgeon: [name], MD;  Location: [facility] INVASIVE CV LAB;  Service: Cardiovascular;  Laterality: N/A;     OB History   No obstetric history on file.     Family History  Problem Relation Age of Onset  . Diabetes Mother   . Diabetes Father   . Diabetes Maternal Grandmother   . Diabetes Maternal Grandfather     Social History   Tobacco Use  . Smoking status: Current Every Day Smoker    Packs/day: 1.50    Years: 20.00    Pack years: 30.00    Types: Cigarettes  . Smokeless tobacco: Never Used  Vaping Use  . Vaping Use: Never used  Substance Use Topics  . Alcohol use: Not Currently  . Drug use: Yes    Types: "Crack" cocaine    Comment: last used yesterday. States " I do it everyday"     Home Medications Prior to Admission medications   Medication Sig Start Date End Date Taking? Authorizing Provider  albuterol (VENTOLIN HFA) 108 (90 Base) MCG/ACT inhaler Inhale 2 puffs  into the lungs every 6 (six) hours as needed for wheezing or shortness of breath.    [provider]  aspirin 81 MG chewable tablet Chew 81 mg by mouth daily.    [provider]  atorvastatin (LIPITOR) 80 MG tablet Take 80 mg by mouth daily at 6 PM.  04/05/18   [provider]  carvedilol (COREG) 12.5 MG tablet Take 1.5 tablets (18.75 mg total) by mouth 2 (two) times daily with a meal. 11/28/19   Marjie Skiff E, PA-C  cephALEXin (KEFLEX) 500 MG capsule Take 1 capsule (500 mg total) by mouth every 6 (six) hours.  11/28/19   Marjie Skiff E, PA-C  cloNIDine (CATAPRES) 0.1 MG tablet Take 0.1 mg by mouth 2 (two) times daily. 12/20/17   [provider]  cyclobenzaprine (FLEXERIL) 10 MG tablet Take 10 mg by mouth 3 (three) times daily as needed for muscle spasms.     [provider]  gabapentin (NEURONTIN) 300 MG capsule Take 300 mg by mouth 3 (three) times daily.    [provider]  glipiZIDE (GLUCOTROL) 5 MG tablet Take 5 mg by mouth 2 (two) times daily before a meal.  12/20/17   [provider]  HUMULIN R 100 UNIT/ML injection Inject 17 Units into the skin 2 (two) times daily before a meal.  06/13/18   [provider]  insulin NPH-regular Human (HUMULIN 70/30) (70-30) 100 UNIT/ML injection Inject 35 Units into the skin 2 (two) times daily.  07/01/17   [provider]  isosorbide mononitrate (IMDUR) 30 MG 24 hr tablet Take 1 tablet (30 mg total) by mouth daily. 11/29/19   Marjie Skiff E, PA-C  losartan (COZAAR) 25 MG tablet Take 1 tablet (25 mg total) by mouth daily. 11/29/19   Marjie Skiff E, PA-C  montelukast (SINGULAIR) 10 MG tablet Take 10 mg by mouth at bedtime.    [provider]  nicotine (NICODERM CQ - DOSED IN MG/24 HOURS) 14 mg/24hr patch Place 1 patch (14 mg total) onto the skin daily. 11/29/19   Corrin Parker, PA-C  nitroGLYCERIN (NITROSTAT) 0.4 MG SL tablet Place 1 tablet (0.4 mg total) under the tongue as needed for chest pain. 11/28/19   Marjie Skiff E, PA-C  pantoprazole (PROTONIX) 40 MG tablet Take 40 mg by mouth 2 (two) times daily.     [provider]  polyethylene glycol powder (GLYCOLAX/MIRALAX) powder Take 17 g by mouth daily as needed for moderate constipation.  04/05/18   [provider]  QUEtiapine (SEROQUEL) 200 MG tablet Take 200 mg by mouth every morning.  04/05/18   [provider]  QUEtiapine (SEROQUEL) 400 MG tablet Take 400 mg by mouth at bedtime. 04/05/18   [provider]   ticagrelor (BRILINTA) 90 MG TABS tablet Take 1 tablet (90 mg total) by mouth 2 (two) times daily. 11/28/19   Marjie Skiff E, PA-C    Allergies    Contrast media [iodinated diagnostic agents] and Tomato  Review of Systems   Review of Systems  Gastrointestinal: Negative for abdominal pain.  Genitourinary: Positive for flank pain.  All other systems reviewed and are negative.   Physical Exam Updated Vital Signs BP (!) 186/99   Pulse (!) 111   Temp 98.1 F (36.7 C)   Resp 18   Ht 5\' 2"  (1.575 m)   Wt 77.1 kg   SpO2 100%   BMI 31.09 kg/m   Physical Exam Vitals and nursing note reviewed.  Constitutional:  General: She is not in acute distress.    Appearance: She is well-developed. She is not diaphoretic.  HENT:     Head: Normocephalic and atraumatic.  Cardiovascular:     Rate and Rhythm: Normal rate and regular rhythm.     Heart sounds: No murmur heard.  No friction rub. No gallop.   Pulmonary:     Effort: Pulmonary effort is normal. No respiratory distress.     Breath sounds: Normal breath sounds. No wheezing.  Abdominal:     General: Bowel sounds are normal. There is no distension.     Palpations: Abdomen is soft.     Tenderness: There is no abdominal tenderness.  Musculoskeletal:        General: Normal range of motion.     Cervical back: Normal range of motion and neck supple.     Comments: There is tenderness to palpation in the bilateral flanks.  There is no abdominal tenderness.  Skin:    General: Skin is warm and dry.  Neurological:     Mental Status: She is alert and oriented to person, place, and time.     ED Results / Procedures / Treatments   Labs (all labs ordered are listed, but only abnormal results are displayed) Labs Reviewed  URINALYSIS, ROUTINE W REFLEX MICROSCOPIC - Abnormal; Notable for the following components:      Result Value   APPearance HAZY (*)    Hgb urine dipstick SMALL (*)    Protein, ur 30 (*)    Leukocytes,Ua SMALL (*)     Bacteria, UA MANY (*)    All other components within normal limits  CBC - Abnormal; Notable for the following components:   WBC 13.0 (*)    RBC 3.76 (*)    Hemoglobin 11.7 (*)    RDW 15.7 (*)    All other components within normal limits  COMPREHENSIVE METABOLIC PANEL - Abnormal; Notable for the following components:   Glucose, Bld 123 (*)    Albumin 3.4 (*)    AST 13 (*)    Total Bilirubin 0.2 (*)    All other components within normal limits  LIPASE, BLOOD  I-STAT BETA HCG BLOOD, ED ([facility], [facility], [facility] ONLY)  TROPONIN I (HIGH SENSITIVITY)  TROPONIN I (HIGH SENSITIVITY)    EKG None  Radiology No results found.  Procedures Procedures (including critical care time)  Medications Ordered in ED Medications  HYDROmorphone (DILAUDID) injection 2 mg (has no administration in time range)  ketorolac (TORADOL) injection 60 mg (has no administration in time range)  acetaminophen (TYLENOL) tablet 650 mg (650 mg Oral Given 02/06/20 1500)    ED Course  I have reviewed the triage vital signs and the nursing notes.  Pertinent labs & imaging results that were available during my care of the patient were reviewed by me and considered in my medical decision making (see chart for details).    MDM Rules/Calculators/A&P  Patient presenting here with a 2-week history of worsening flank pain.  She has a history of recurrent UTIs, but also recent stent placement after STEMI.  She is not complaining of any chest pain or shortness of breath and her symptoms do not sound cardiac in nature.  She does have evidence for a mild UTI and will undergo CT scan to rule out calculus as the cause of her flank pain.  Patient given IM medications here in the ER for pain.  Care will be signed out to the oncoming provider at shift change who  will obtain the results of the CT scan and determine the final disposition.  Final Clinical Impression(s) / ED Diagnoses Final diagnoses:  None    Rx / DC Orders ED  Discharge Orders    None       Geoffery Lyons, MD 02/06/20 [phone]

## 2020-02-06 NOTE — ED Notes (Signed)
Pt now c.o chest pain while waiting in lobby, brought back to triage for EKG and blood draw

## 2020-02-06 NOTE — ED Notes (Signed)
Pt is refusing all vitals she said she will wait until she get back in the room

## 2020-02-06 NOTE — ED Notes (Signed)
Patient transported to CT 

## 2020-02-06 NOTE — ED Notes (Signed)
Pt called for vitals 4x, no answer.

## 2020-02-06 NOTE — ED Triage Notes (Signed)
Pt from home with ems for left sided flank pain for the past 2 weeks, pt seen a provider and given prednisone and muscle relaxer but has not taken it in 2 weeks. Pain sometimes radiates to her hip and leg.

## 2020-02-06 NOTE — ED Provider Notes (Signed)
Urinalysis suggestive of perhaps early urinary tract infection.  Urine sent for culture.  But do not think that this is probably the cause of her pain.  That may be more musculoskeletal.  CT renal study without any acute findings.  Also treat with a short course of hydrocodone and follow-up with her regular doctor as well as a 7-day course of Keflex.   Vanetta Mulders, MD 02/06/20 1747

## 2020-02-06 NOTE — Discharge Instructions (Signed)
CT scan of the abdomen without any acute findings.  Urinalysis suggestive of urinary tract infection.  But not sure that is responsible for your pain.  Urine sent for culture.  Take the Keflex as directed for the next 7 days for the urinary tract infection.  Take the hydrocodone as needed for pain.  Make an appointment to follow back up with your doctor.

## 2020-02-06 NOTE — ED Notes (Signed)
Pt refused vitals 

## 2020-02-07 ENCOUNTER — Telehealth: Payer: Self-pay | Admitting: *Deleted

## 2020-02-07 NOTE — Telephone Encounter (Signed)
Pt called related to inactive MATCH card.  EDCM researched [facility] system to find that pt was not reinstated.  EDCM corrected card, called pharmacy and stayed on phone to insure card was active. Pt was able to purchase Rx.

## 2020-02-08 LAB — URINE CULTURE: Culture: >=100000 — AB

## 2020-02-09 ENCOUNTER — Telehealth: Payer: Self-pay | Admitting: Emergency Medicine

## 2020-02-09 NOTE — Telephone Encounter (Signed)
Post ED Visit - Positive Culture Follow-up  Culture report reviewed by antimicrobial stewardship pharmacist: [facility] []  , Pharm.D. []  Enzo Bi, Pharm.D., BCPS AQ-ID []  , Pharm.D., BCPS []  Celedonio Miyamoto, .D., BCPS []  [place], .D., BCPS, AAHIVP []  Georgina Pillion, Pharm.D., BCPS, AAHIVP []  [address], PharmD, BCPS []  , PharmD, BCPS []  [place], PharmD, BCPS []  [address], PharmD []  , PharmD, BCPS [x]  Estella Husk, PharmD  Pharmacy Team []  Lysle Pearl, PharmD []  , PharmD []  Phillips Climes, PharmD []  , Rph []  Agapito Games) , PharmD []  Verlan Friends, PharmD []  , PharmD []  Mervyn Gay, PharmD []  , PharmD []  Vinnie Level, PharmD []  [facility], PharmD []  , PharmD []  Len Childs, PharmD   Positive urine culture Treated with Cephalexin, organism sensitive to the same and no further patient follow-up is required at this time.  Ayaansh Smail 02/09/2020, 12:40 PM

## 2020-03-02 DIAGNOSIS — I639 Cerebral infarction, unspecified: Secondary | ICD-10-CM

## 2020-03-02 HISTORY — DX: Cerebral infarction, unspecified: I63.9

## 2020-03-22 ENCOUNTER — Inpatient Hospital Stay (HOSPITAL_COMMUNITY)
Admission: EM | Admit: 2020-03-22 | Discharge: 2020-03-24 | DRG: 065 | Disposition: A | Discharge status: Home / Self Care | Payer: Self-pay | Attending: Family Medicine | Admitting: Family Medicine

## 2020-03-22 ENCOUNTER — Encounter (HOSPITAL_COMMUNITY): Payer: Self-pay

## 2020-03-22 ENCOUNTER — Other Ambulatory Visit: Payer: Self-pay

## 2020-03-22 ENCOUNTER — Emergency Department (HOSPITAL_COMMUNITY): Payer: Self-pay

## 2020-03-22 DIAGNOSIS — I110 Hypertensive heart disease with heart failure: Secondary | ICD-10-CM | POA: Diagnosis present

## 2020-03-22 DIAGNOSIS — Z91041 Radiographic dye allergy status: Secondary | ICD-10-CM

## 2020-03-22 DIAGNOSIS — Z720 Tobacco use: Secondary | ICD-10-CM | POA: Diagnosis present

## 2020-03-22 DIAGNOSIS — R531 Weakness: Secondary | ICD-10-CM

## 2020-03-22 DIAGNOSIS — Z6835 Body mass index (BMI) 35.0-35.9, adult: Secondary | ICD-10-CM

## 2020-03-22 DIAGNOSIS — F17210 Nicotine dependence, cigarettes, uncomplicated: Secondary | ICD-10-CM | POA: Diagnosis present

## 2020-03-22 DIAGNOSIS — Z9861 Coronary angioplasty status: Secondary | ICD-10-CM

## 2020-03-22 DIAGNOSIS — N179 Acute kidney failure, unspecified: Secondary | ICD-10-CM | POA: Diagnosis present

## 2020-03-22 DIAGNOSIS — E785 Hyperlipidemia, unspecified: Secondary | ICD-10-CM | POA: Diagnosis present

## 2020-03-22 DIAGNOSIS — E7800 Pure hypercholesterolemia, unspecified: Secondary | ICD-10-CM | POA: Diagnosis present

## 2020-03-22 DIAGNOSIS — E1140 Type 2 diabetes mellitus with diabetic neuropathy, unspecified: Secondary | ICD-10-CM | POA: Diagnosis present

## 2020-03-22 DIAGNOSIS — Z20822 Contact with and (suspected) exposure to covid-19: Secondary | ICD-10-CM | POA: Diagnosis present

## 2020-03-22 DIAGNOSIS — Z91018 Allergy to other foods: Secondary | ICD-10-CM

## 2020-03-22 DIAGNOSIS — R29701 NIHSS score 1: Secondary | ICD-10-CM | POA: Diagnosis present

## 2020-03-22 DIAGNOSIS — I5042 Chronic combined systolic (congestive) and diastolic (congestive) heart failure: Secondary | ICD-10-CM | POA: Diagnosis present

## 2020-03-22 DIAGNOSIS — I10 Essential (primary) hypertension: Secondary | ICD-10-CM | POA: Diagnosis present

## 2020-03-22 DIAGNOSIS — K219 Gastro-esophageal reflux disease without esophagitis: Secondary | ICD-10-CM | POA: Diagnosis present

## 2020-03-22 DIAGNOSIS — M5030 Other cervical disc degeneration, unspecified cervical region: Secondary | ICD-10-CM | POA: Diagnosis present

## 2020-03-22 DIAGNOSIS — I639 Cerebral infarction, unspecified: Principal | ICD-10-CM | POA: Diagnosis present

## 2020-03-22 DIAGNOSIS — Z794 Long term (current) use of insulin: Secondary | ICD-10-CM

## 2020-03-22 DIAGNOSIS — E1165 Type 2 diabetes mellitus with hyperglycemia: Secondary | ICD-10-CM | POA: Diagnosis present

## 2020-03-22 DIAGNOSIS — E669 Obesity, unspecified: Secondary | ICD-10-CM | POA: Diagnosis present

## 2020-03-22 DIAGNOSIS — Z7982 Long term (current) use of aspirin: Secondary | ICD-10-CM

## 2020-03-22 DIAGNOSIS — I25119 Atherosclerotic heart disease of native coronary artery with unspecified angina pectoris: Secondary | ICD-10-CM

## 2020-03-22 DIAGNOSIS — Z833 Family history of diabetes mellitus: Secondary | ICD-10-CM

## 2020-03-22 DIAGNOSIS — I6381 Other cerebral infarction due to occlusion or stenosis of small artery: Secondary | ICD-10-CM

## 2020-03-22 DIAGNOSIS — Z8673 Personal history of transient ischemic attack (TIA), and cerebral infarction without residual deficits: Secondary | ICD-10-CM | POA: Diagnosis present

## 2020-03-22 DIAGNOSIS — Z91030 Bee allergy status: Secondary | ICD-10-CM

## 2020-03-22 DIAGNOSIS — Z79899 Other long term (current) drug therapy: Secondary | ICD-10-CM

## 2020-03-22 DIAGNOSIS — I69351 Hemiplegia and hemiparesis following cerebral infarction affecting right dominant side: Secondary | ICD-10-CM

## 2020-03-22 DIAGNOSIS — I2510 Atherosclerotic heart disease of native coronary artery without angina pectoris: Secondary | ICD-10-CM | POA: Diagnosis present

## 2020-03-22 DIAGNOSIS — I252 Old myocardial infarction: Secondary | ICD-10-CM

## 2020-03-22 LAB — COMPREHENSIVE METABOLIC PANEL
ALT: 15 U/L (ref 0–44)
AST: 14 U/L — ABNORMAL LOW (ref 15–41)
Albumin: 3.8 g/dL (ref 3.5–5.0)
Alkaline Phosphatase: 69 U/L (ref 38–126)
Anion gap: 16 — ABNORMAL HIGH (ref 5–15)
BUN: 11 mg/dL (ref 6–20)
CO2: 17 mmol/L — ABNORMAL LOW (ref 22–32)
Calcium: 9.2 mg/dL (ref 8.9–10.3)
Chloride: 102 mmol/L (ref 98–111)
Creatinine, Ser: 1.16 mg/dL — ABNORMAL HIGH (ref 0.44–1.00)
GFR calc Af Amer: >60 mL/min (ref >60)
GFR calc non Af Amer: 56 mL/min — ABNORMAL LOW (ref >60)
Glucose, Bld: 354 mg/dL — ABNORMAL HIGH (ref 70–99)
Potassium: 4.2 mmol/L (ref 3.5–5.1)
Sodium: 135 mmol/L (ref 135–145)
Total Bilirubin: 0.3 mg/dL (ref 0.3–1.2)
Total Protein: 7.4 g/dL (ref 6.5–8.1)

## 2020-03-22 LAB — CBC
HCT: 35 % — ABNORMAL LOW (ref 36.0–46.0)
Hemoglobin: 11 g/dL — ABNORMAL LOW (ref 12.0–15.0)
MCH: 31.5 pg (ref 26.0–34.0)
MCHC: 31.4 g/dL (ref 30.0–36.0)
MCV: 100.3 fL — ABNORMAL HIGH (ref 80.0–100.0)
Platelets: 261 10*3/uL (ref 150–400)
RBC: 3.49 MIL/uL — ABNORMAL LOW (ref 3.87–5.11)
RDW: 15.6 % — ABNORMAL HIGH (ref 11.5–15.5)
WBC: 6.2 10*3/uL (ref 4.0–10.5)
nRBC: 0 % (ref 0.0–0.2)

## 2020-03-22 LAB — DIFFERENTIAL
Abs Immature Granulocytes: 0.03 10*3/uL (ref 0.00–0.07)
Basophils Absolute: 0 10*3/uL (ref 0.0–0.1)
Basophils Relative: 1 %
Eosinophils Absolute: 0.1 10*3/uL (ref 0.0–0.5)
Eosinophils Relative: 2 %
Immature Granulocytes: 1 %
Lymphocytes Relative: 42 %
Lymphs Abs: 2.6 10*3/uL (ref 0.7–4.0)
Monocytes Absolute: 0.4 10*3/uL (ref 0.1–1.0)
Monocytes Relative: 7 %
Neutro Abs: 2.9 10*3/uL (ref 1.7–7.7)
Neutrophils Relative %: 47 %

## 2020-03-22 LAB — I-STAT CHEM 8, ED
BUN: 13 mg/dL (ref 6–20)
Calcium, Ion: 1.14 mmol/L — ABNORMAL LOW (ref 1.15–1.40)
Chloride: 106 mmol/L (ref 98–111)
Creatinine, Ser: 0.8 mg/dL (ref 0.44–1.00)
Glucose, Bld: 359 mg/dL — ABNORMAL HIGH (ref 70–99)
HCT: 34 % — ABNORMAL LOW (ref 36.0–46.0)
Hemoglobin: 11.6 g/dL — ABNORMAL LOW (ref 12.0–15.0)
Potassium: 4.2 mmol/L (ref 3.5–5.1)
Sodium: 136 mmol/L (ref 135–145)
TCO2: 19 mmol/L — ABNORMAL LOW (ref 22–32)

## 2020-03-22 LAB — APTT: aPTT: 27 seconds (ref 24–36)

## 2020-03-22 LAB — PROTIME-INR
INR: 1 (ref 0.8–1.2)
Prothrombin Time: 12.7 seconds (ref 11.4–15.2)

## 2020-03-22 IMAGING — CT CT HEAD W/O CM
4 series · 16 of 47 positions shown, 18 images · non-contrast
Comparison: None.

CLINICAL DATA: Right-sided weakness for 3 days.

EXAM:
CT HEAD WITHOUT CONTRAST
TECHNIQUE: Contiguous axial images were obtained from the base of the skull
through the vertex without intravenous contrast.

[Series 3: head bone · axial · 0.40mm/px · z∈[+1016,+1044]mm · 3 of 74 slices shown]
[im 8/74  bone]
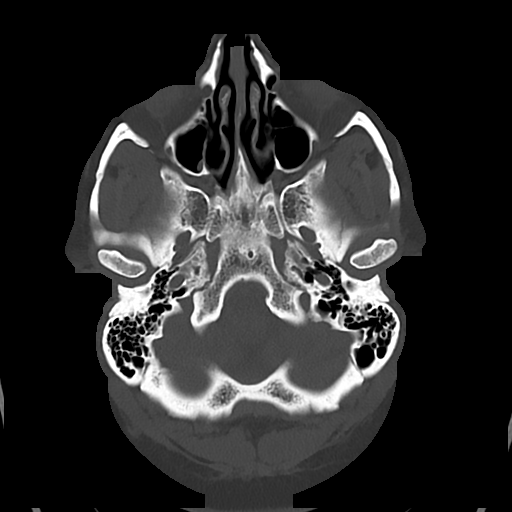
[im 15/74  bone]
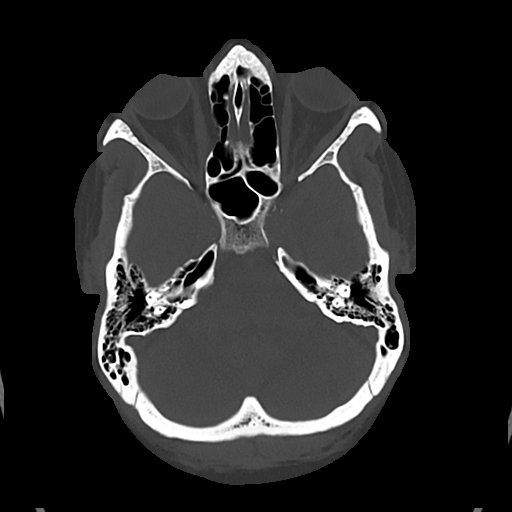
[im 22/74  bone]
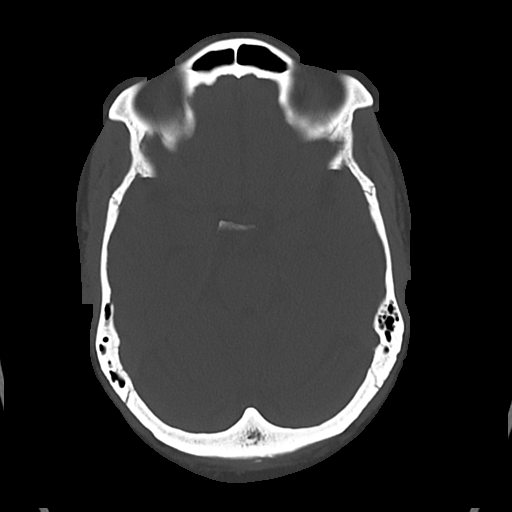

[Series 4: head wo · axial · 0.40mm/px · z∈[+1018,+1128]mm · 7 of 30 slices shown, 9 images]
[im 4/30  brain]
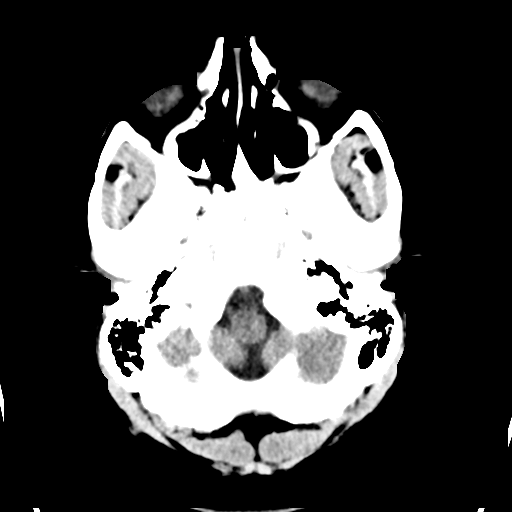
[im 4/30  bone]
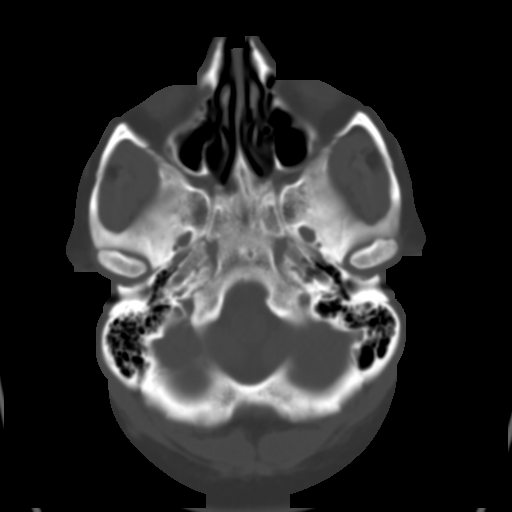
[im 8/30  brain]
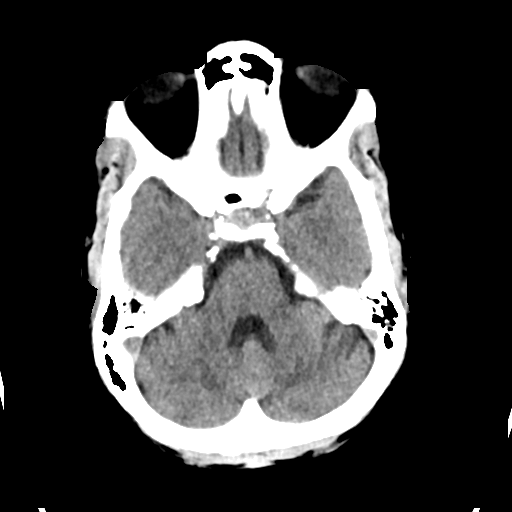
[im 11/30  brain]
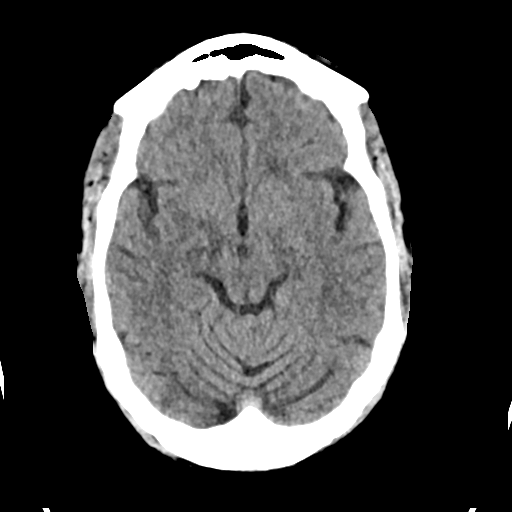
[im 15/30  brain]
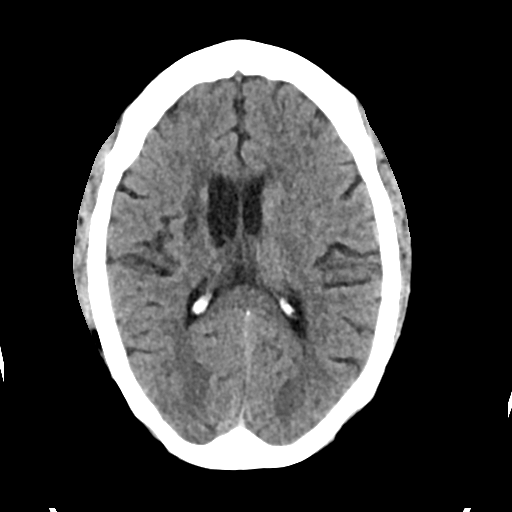
[im 19/30  brain]
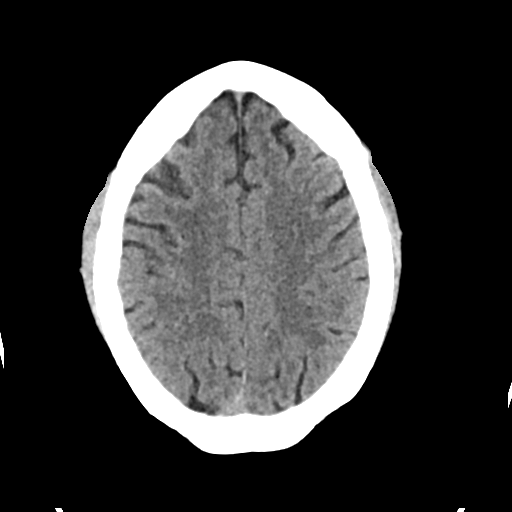
[im 19/30  bone]
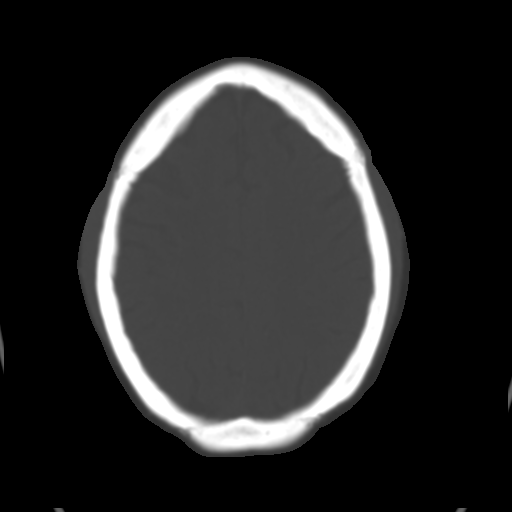
[im 22/30  brain]
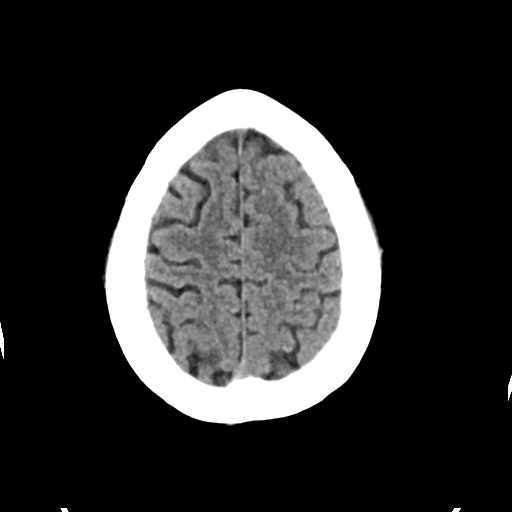
[im 26/30  brain]
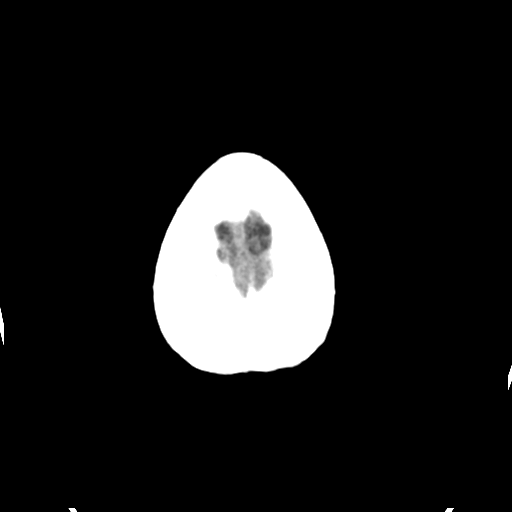

[Series 5: cor soft · coronal · 0.30mm/px · 3 of 61 slices shown]
[im 21/61  brain]
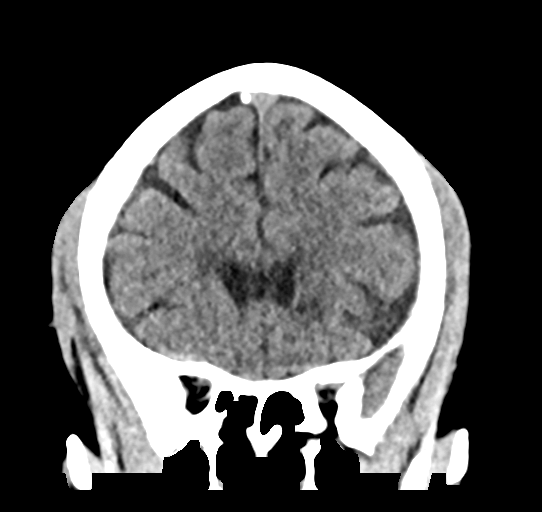
[im 27/61  brain]
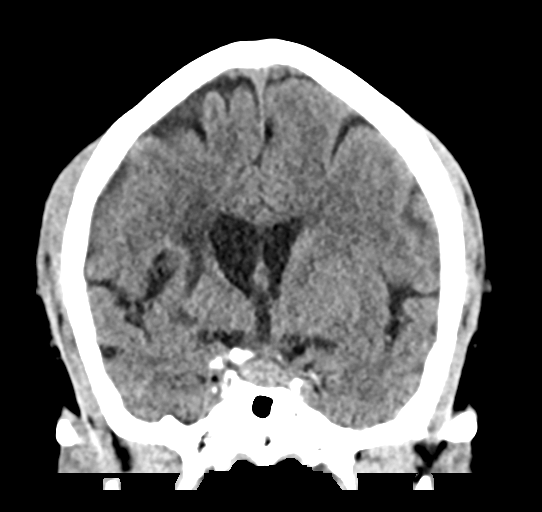
[im 34/61  brain]
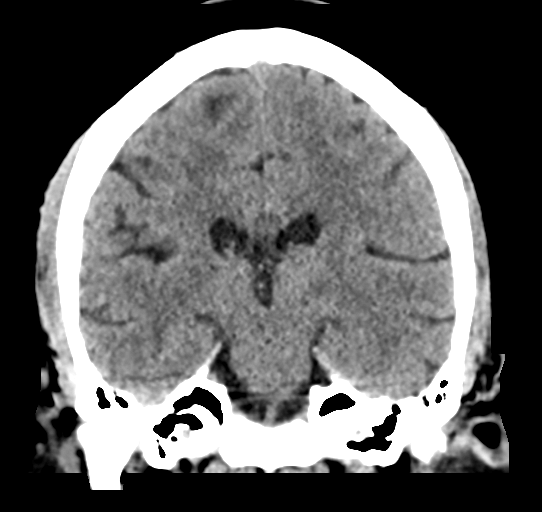

[Series 6: sag soft · sagittal · 0.31mm/px · 3 of 51 slices shown]
[im 17/51  brain]
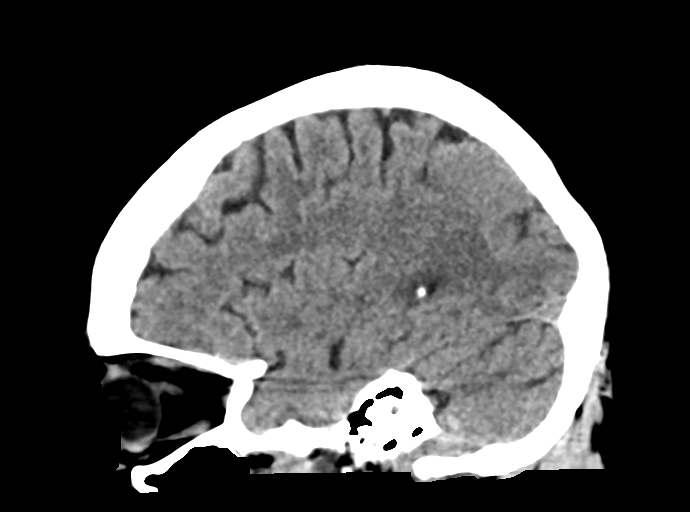
[im 26/51  brain]
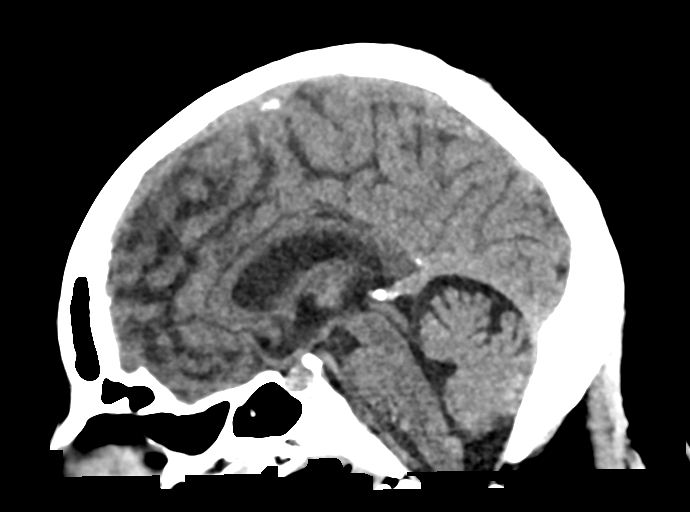
[im 34/51  brain]
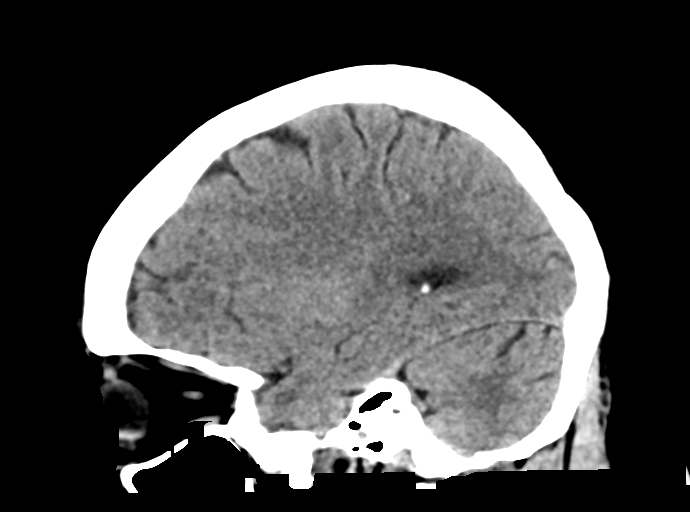

[16 of 47 positions shown; findings below may reference images not displayed]

FINDINGS: Brain: No subdural, epidural, or subarachnoid hemorrhage. Focal
low-attenuation in the right basal ganglia extending superiorly into
the corona radiata, age indeterminate. Focal low-attenuation in the
white matter of of the lower left frontal lobe on axial image 12. No
other evidence of acute ischemia or infarct. No mass effect or
midline shift. Ventricles and sulci are unremarkable. Cerebellum,
brainstem, and basal cisterns are normal.

Vascular: No hyperdense vessel or unexpected calcification.

Skull: Normal. Negative for fracture or focal lesion.

Sinuses/Orbits: No acute finding.

Other: None.
IMPRESSION: 1. Focal white matter changes in the right corona radiata extending
into the right basal ganglia and in the left frontal lobe, age
indeterminate. No acute cortical ischemia identified. No other acute
abnormalities.

## 2020-03-22 MED ORDER — SODIUM CHLORIDE 0.9% FLUSH
3.0000 mL | Freq: Once | INTRAVENOUS | Status: DC
Start: 2020-03-22 — End: 2020-03-24

## 2020-03-22 MED ORDER — ACETAMINOPHEN 325 MG PO TABS
650.0000 mg | ORAL_TABLET | Freq: Once | ORAL | Status: AC | PRN
  Administered 2020-03-22: 650 mg via ORAL
  Filled 2020-03-22: qty 2

## 2020-03-22 MED ORDER — ACETAMINOPHEN 325 MG PO TABS
650.0000 mg | ORAL_TABLET | Freq: Once | ORAL | Status: DC
  Filled 2020-03-22: qty 2

## 2020-03-22 NOTE — ED Triage Notes (Signed)
Patient arrived by [facility] following 3 days of right sided numbness, also complains of right side pain. Alert and oriented. Speech clear and moving all extremities. States that she is ambulating at home. NAD. Has not taken her daily dose of insulin today

## 2020-03-23 ENCOUNTER — Inpatient Hospital Stay (HOSPITAL_COMMUNITY): Payer: Self-pay

## 2020-03-23 ENCOUNTER — Emergency Department (HOSPITAL_COMMUNITY): Payer: Self-pay

## 2020-03-23 DIAGNOSIS — I639 Cerebral infarction, unspecified: Principal | ICD-10-CM

## 2020-03-23 DIAGNOSIS — I340 Nonrheumatic mitral (valve) insufficiency: Secondary | ICD-10-CM

## 2020-03-23 DIAGNOSIS — Z8673 Personal history of transient ischemic attack (TIA), and cerebral infarction without residual deficits: Secondary | ICD-10-CM | POA: Diagnosis present

## 2020-03-23 DIAGNOSIS — I361 Nonrheumatic tricuspid (valve) insufficiency: Secondary | ICD-10-CM

## 2020-03-23 DIAGNOSIS — I351 Nonrheumatic aortic (valve) insufficiency: Secondary | ICD-10-CM

## 2020-03-23 LAB — LIPID PANEL
Cholesterol: 166 mg/dL (ref 0–200)
HDL: 37 mg/dL — ABNORMAL LOW (ref >40)
LDL Cholesterol: 90 mg/dL (ref 0–99)
Total CHOL/HDL Ratio: 4.5 RATIO
Triglycerides: 193 mg/dL — ABNORMAL HIGH (ref <150)
VLDL: 39 mg/dL (ref 0–40)

## 2020-03-23 LAB — BASIC METABOLIC PANEL
Anion gap: 10 (ref 5–15)
Anion gap: 11 (ref 5–15)
BUN: 12 mg/dL (ref 6–20)
BUN: 13 mg/dL (ref 6–20)
CO2: 23 mmol/L (ref 22–32)
CO2: 21 mmol/L — ABNORMAL LOW (ref 22–32)
Calcium: 9 mg/dL (ref 8.9–10.3)
Calcium: 9.2 mg/dL (ref 8.9–10.3)
Chloride: 104 mmol/L (ref 98–111)
Chloride: 102 mmol/L (ref 98–111)
Creatinine, Ser: 1 mg/dL (ref 0.44–1.00)
Creatinine, Ser: 1.18 mg/dL — ABNORMAL HIGH (ref 0.44–1.00)
GFR calc Af Amer: >60 mL/min (ref >60)
GFR calc Af Amer: >60 mL/min (ref >60)
GFR calc non Af Amer: >60 mL/min (ref >60)
GFR calc non Af Amer: 55 mL/min — ABNORMAL LOW (ref >60)
Glucose, Bld: 238 mg/dL — ABNORMAL HIGH (ref 70–99)
Glucose, Bld: 478 mg/dL — ABNORMAL HIGH (ref 70–99)
Potassium: 3.7 mmol/L (ref 3.5–5.1)
Potassium: 4.1 mmol/L (ref 3.5–5.1)
Sodium: 137 mmol/L (ref 135–145)
Sodium: 134 mmol/L — ABNORMAL LOW (ref 135–145)

## 2020-03-23 LAB — ECHOCARDIOGRAM COMPLETE
Area-P 1/2: 3.63 cm2
Height: 62 in
P 1/2 time: 348 msec
S' Lateral: 2.4 cm
Single Plane A4C EF: 53 %
Weight: 2944 oz

## 2020-03-23 LAB — CBG MONITORING, ED
Glucose-Capillary: 454 mg/dL — ABNORMAL HIGH (ref 70–99)
Glucose-Capillary: 474 mg/dL — ABNORMAL HIGH (ref 70–99)
Glucose-Capillary: 307 mg/dL — ABNORMAL HIGH (ref 70–99)
Glucose-Capillary: 298 mg/dL — ABNORMAL HIGH (ref 70–99)
Glucose-Capillary: 227 mg/dL — ABNORMAL HIGH (ref 70–99)

## 2020-03-23 LAB — SARS CORONAVIRUS 2 BY RT PCR (HOSPITAL ORDER, PERFORMED IN ~~LOC~~ HOSPITAL LAB): SARS Coronavirus 2: NEGATIVE

## 2020-03-23 LAB — GLUCOSE, CAPILLARY
Glucose-Capillary: 436 mg/dL — ABNORMAL HIGH (ref 70–99)
Glucose-Capillary: 356 mg/dL — ABNORMAL HIGH (ref 70–99)

## 2020-03-23 LAB — HEMOGLOBIN A1C
Hgb A1c MFr Bld: 9.1 % — ABNORMAL HIGH (ref 4.8–5.6)
Mean Plasma Glucose: 214.47 mg/dL

## 2020-03-23 LAB — BETA-HYDROXYBUTYRIC ACID: Beta-Hydroxybutyric Acid: <0.05 mmol/L — ABNORMAL LOW (ref 0.05–0.27)

## 2020-03-23 IMAGING — MR MR CERVICAL SPINE W/O CM
5 series · 37 of 48 positions shown · non-contrast
Comparison: None.

CLINICAL DATA: Right-sided numbness and pain

EXAM:
MRI HEAD WITHOUT CONTRAST
MRI CERVICAL SPINE WITHOUT CONTRAST
TECHNIQUE: Multiplanar, multiecho pulse sequences of the brain and surrounding
structures, and cervical spine, to include the craniocervical
junction and cervicothoracic junction, were obtained without
intravenous contrast.

[Series 5: T2 · sagittal · 3.0mm · 0.69mm/px · 6 of 15 slices shown (1 of 2)]
[im 1/15]
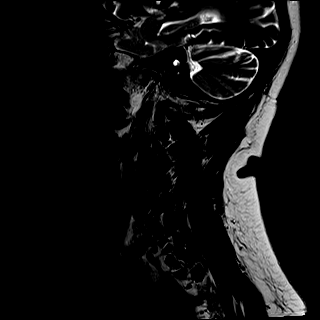
[im 3/15]
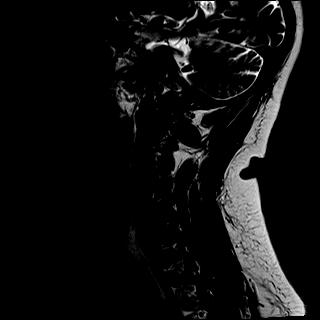
[im 6/15]
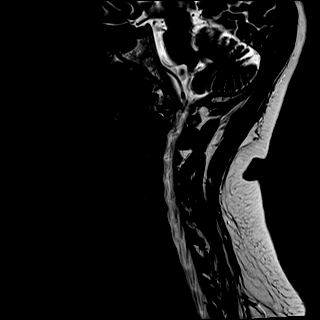
[im 9/15]
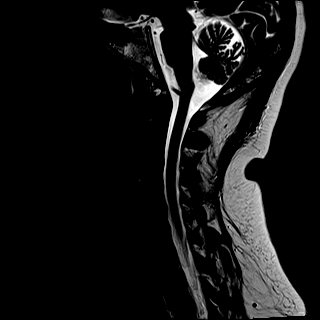
[im 12/15]
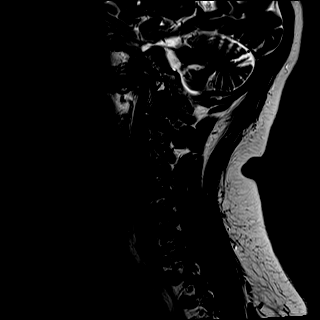
[im 15/15]
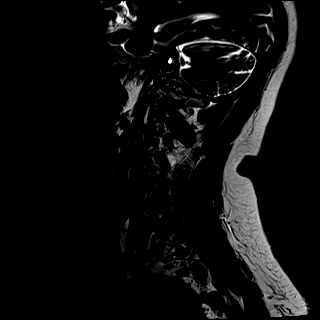

[Series 6: T1 · sagittal · 3.0mm · 0.69mm/px · 6 of 15 slices shown]
[im 1/15]
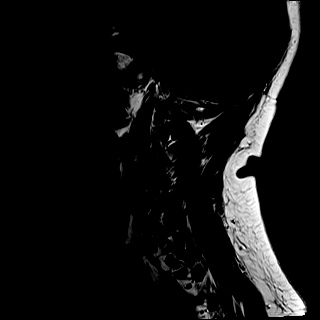
[im 3/15]
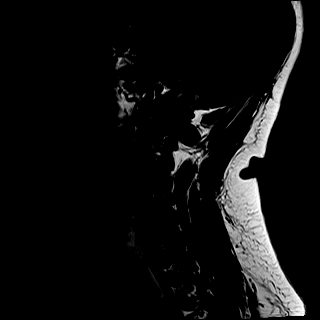
[im 6/15]
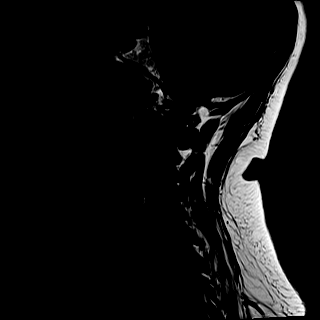
[im 9/15]
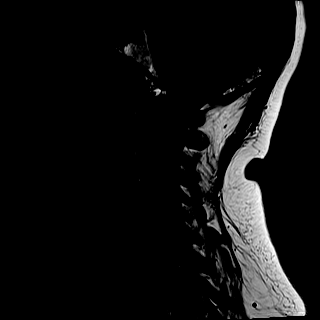
[im 12/15]
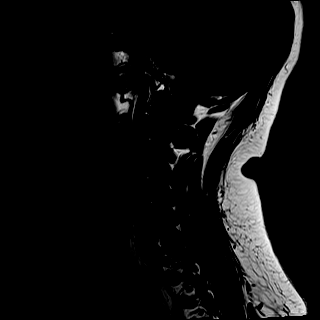
[im 15/15]
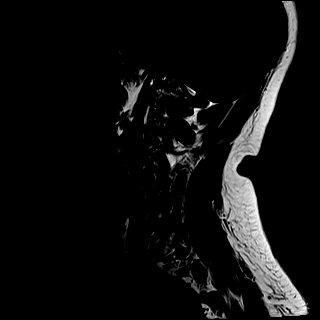

[Series 7: STIR · sagittal · 3.0mm · 0.86mm/px · 6 of 15 slices shown]
[im 1/15]
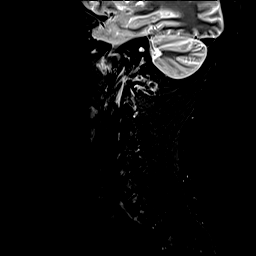
[im 3/15]
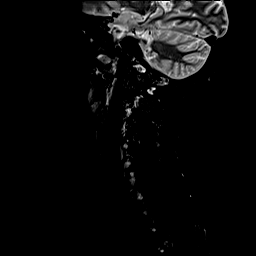
[im 6/15]
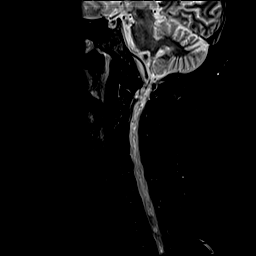
[im 9/15]
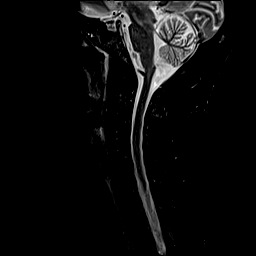
[im 12/15]
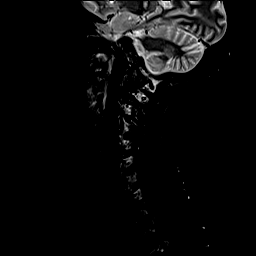
[im 15/15]
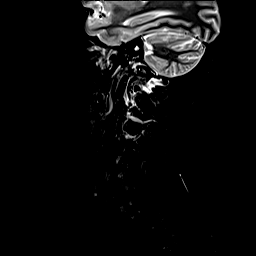

[Series 8: T2 · axial · 3.0mm · 0.66mm/px · z∈[-238,-116]mm · 11 of 40 slices shown (2 of 2)]
[im 1/40]
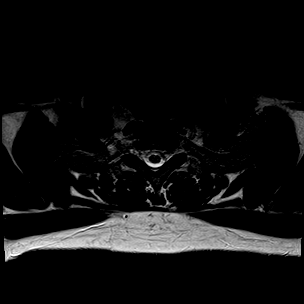
[im 3/40]
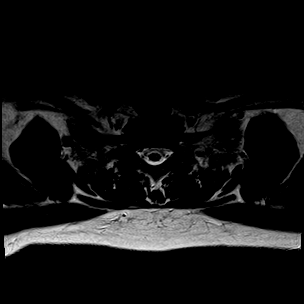
[im 6/40]
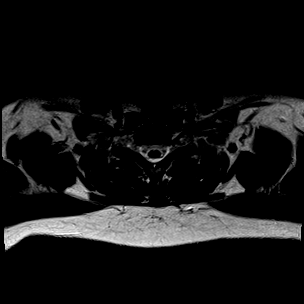
[im 9/40]
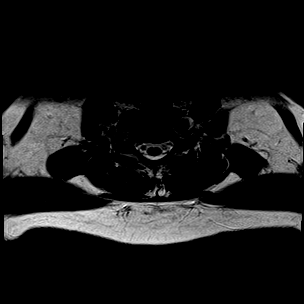
[im 12/40]
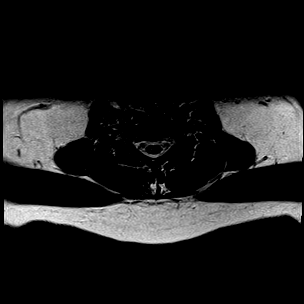
[im 17/40]
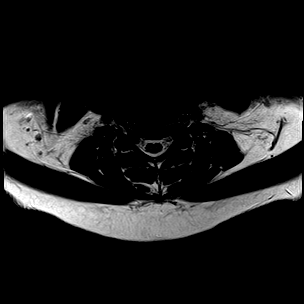
[im 20/40]
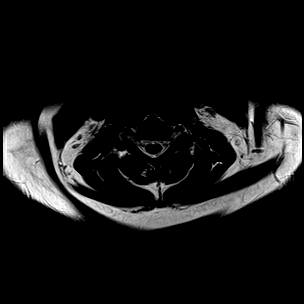
[im 23/40]
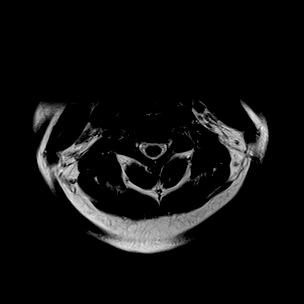
[im 28/40]
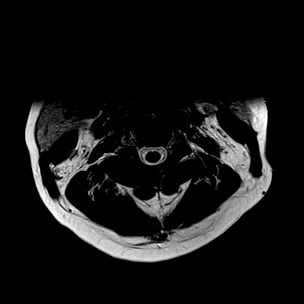
[im 34/40]
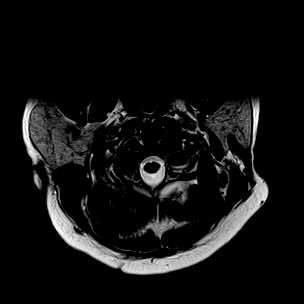
[im 40/40]
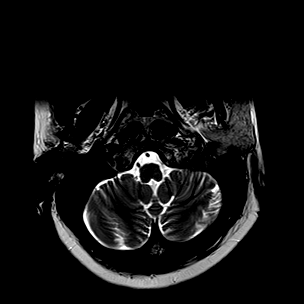

[Series 9: GRE · axial · 3.0mm · 0.39mm/px · z∈[-238,-116]mm · 8 of 40 slices shown]
[im 1/40]
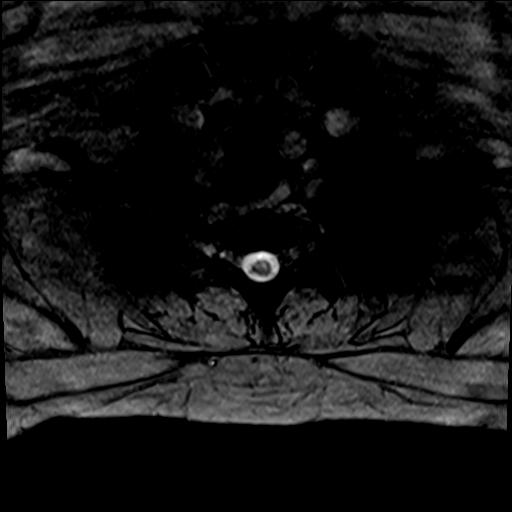
[im 6/40]
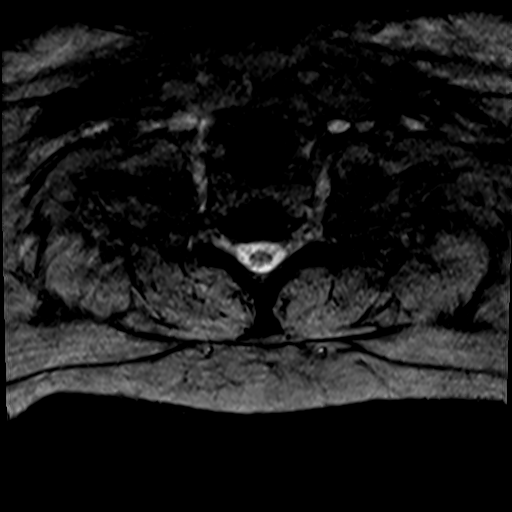
[im 12/40]
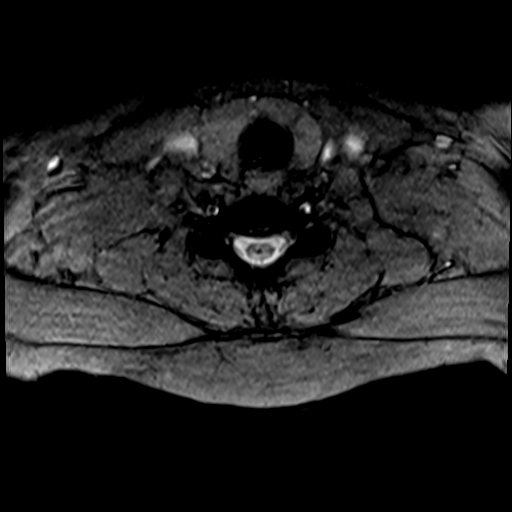
[im 17/40]
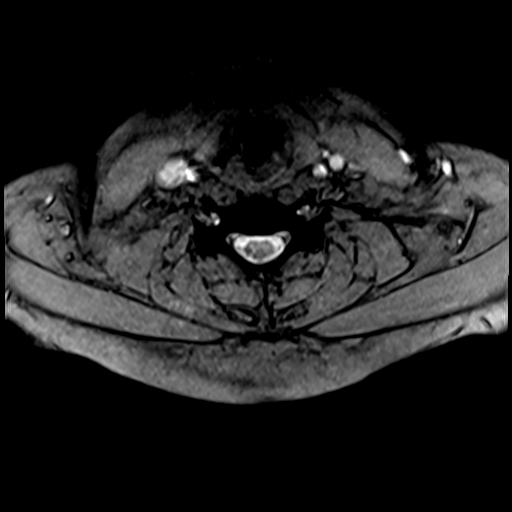
[im 23/40]
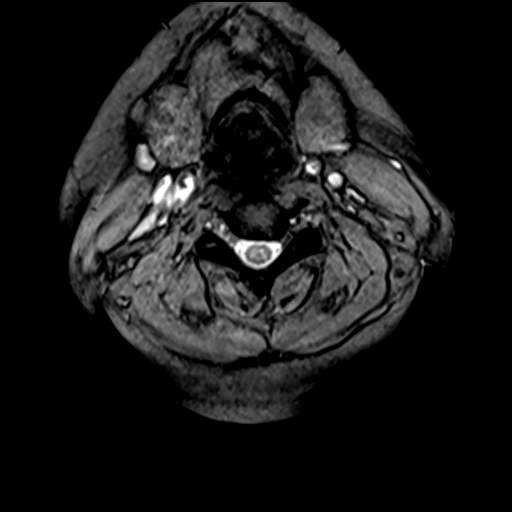
[im 28/40]
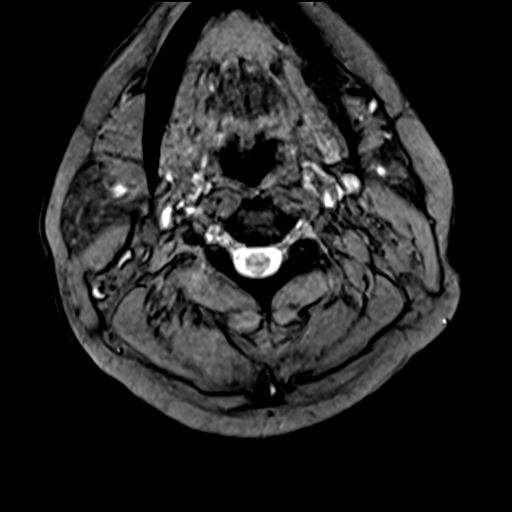
[im 34/40]
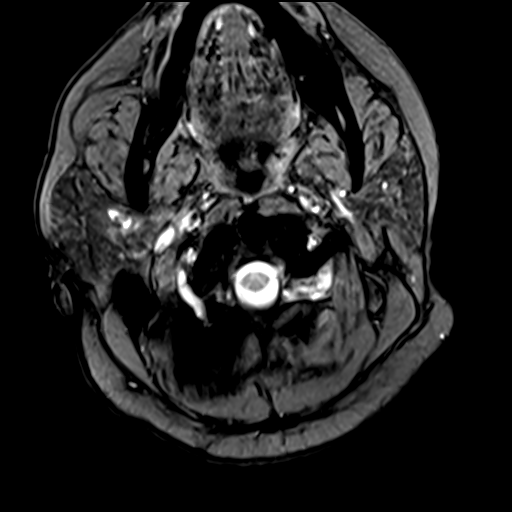
[im 40/40]
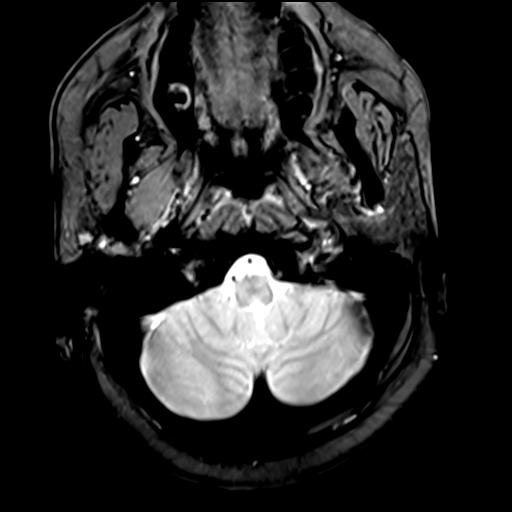

[37 of 48 positions shown; findings below may reference images not displayed]

FINDINGS: MRI HEAD FINDINGS

Brain: There is a small focus of abnormal diffusion restriction
within the left thalamus. Hyperintense T2-weighted signal within the
periventricular white matter, right greater than left. There are old
bilateral small vessel infarcts of the basal ganglia. Normal volume
of CSF spaces. No chronic microhemorrhage. Normal midline
structures.

Vascular: Normal flow voids.

Skull and upper cervical spine: Normal marrow signal.

Sinuses/Orbits: Negative.

Other: None.

MRI CERVICAL SPINE FINDINGS

Alignment: Physiologic.

Vertebrae: No fracture, evidence of discitis, or bone lesion.

Cord: Normal signal and morphology.

Posterior Fossa, vertebral arteries, paraspinal tissues: Negative.

Disc levels: C1-2: Unremarkable.

C2-3: Normal.

C3-4: Small left uncovertebral osteophyte.  No stenosis.

C4-5: Normal.

C5-6: Normal.

C6-7: Normal.

C7-T1: Normal.
IMPRESSION: 1. Small focus of acute/subacute ischemia within the left thalamus.
No hemorrhage or mass effect.
2. Old bilateral basal ganglia small vessel infarcts.
3. Normal cervical spinal cord.
4. Mild cervical degenerative disc disease without spinal canal or
neural foraminal stenosis.

## 2020-03-23 IMAGING — MR MR MRA HEAD W/O CM
1 series · 20 of 48 positions shown · non-contrast
Comparison: Brain MRI from earlier today

CLINICAL DATA: Stroke follow-up.

EXAM:
MRA HEAD WITHOUT CONTRAST
TECHNIQUE: Angiographic images of the Circle of Willis were obtained using MRA
technique without intravenous contrast.

[Series 5: 3d cow · axial · 0.5mm · 0.41mm/px · z∈[-115,-39]mm · 20 of 172 slices shown]
[im 1/172]
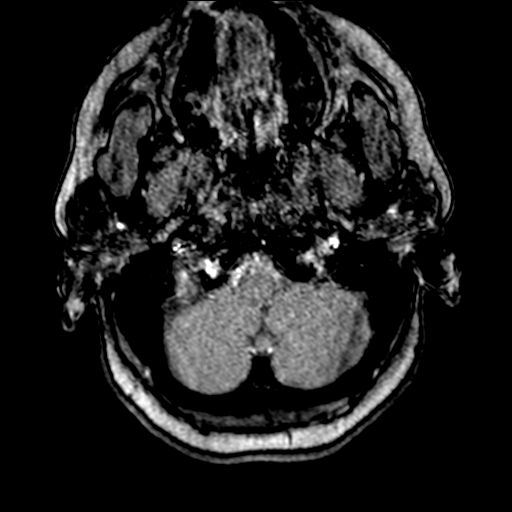
[im 4/172]
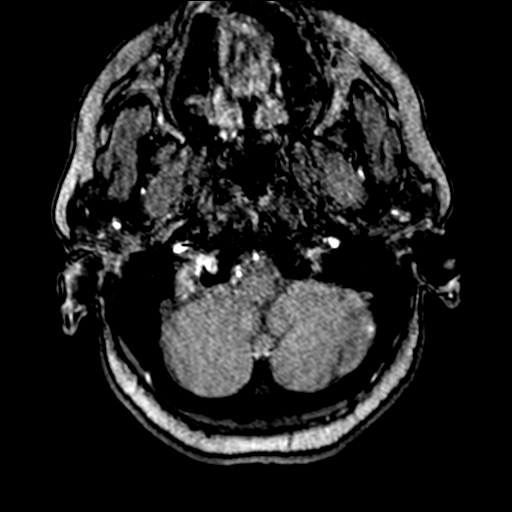
[im 8/172]
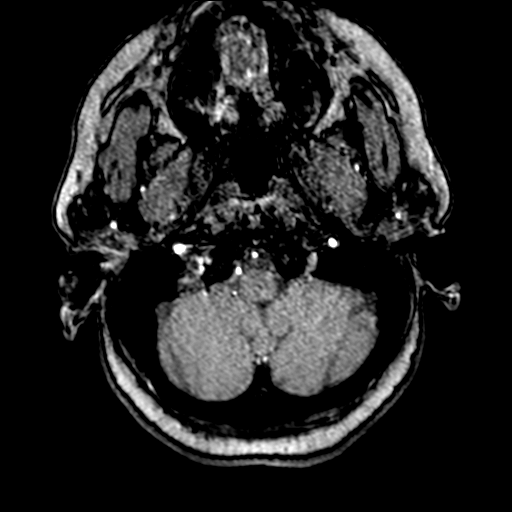
[im 11/172]
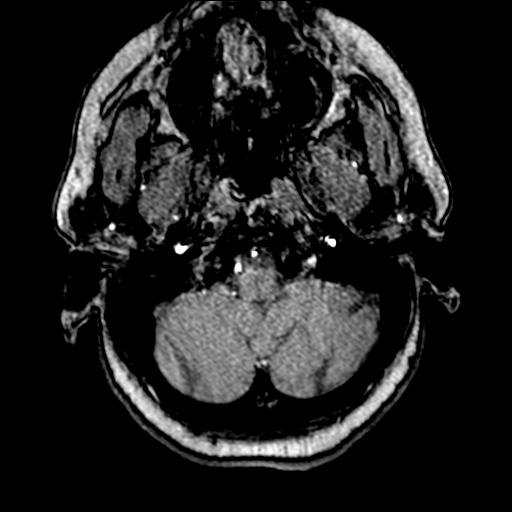
[im 15/172]
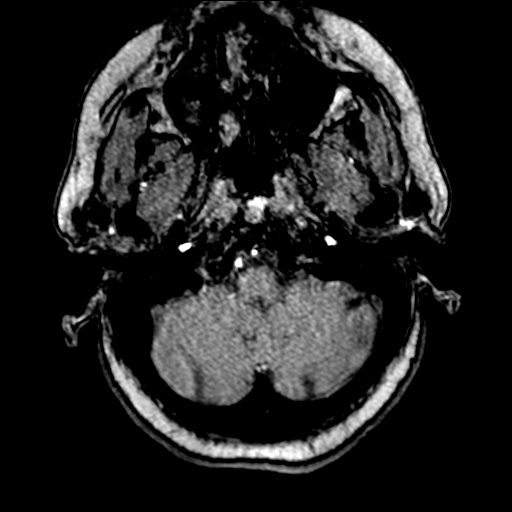
[im 19/172]
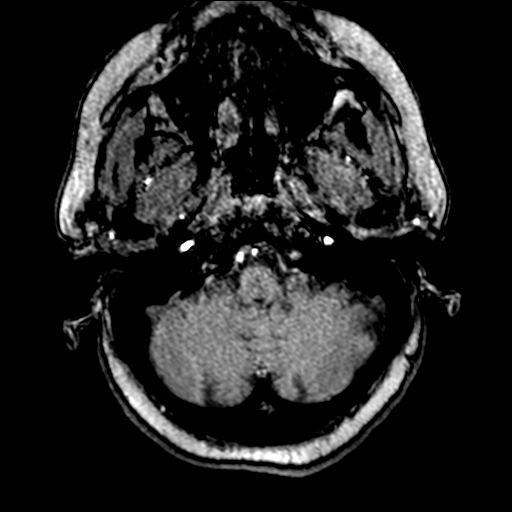
[im 22/172]
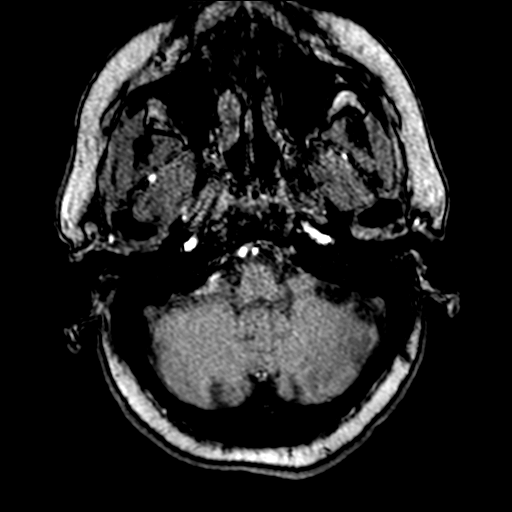
[im 26/172]
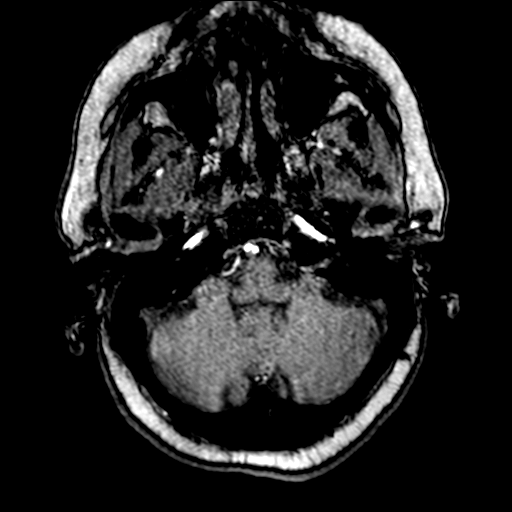
[im 30/172]
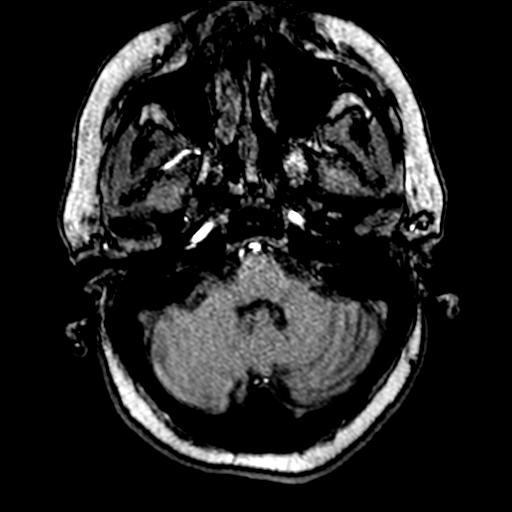
[im 33/172]
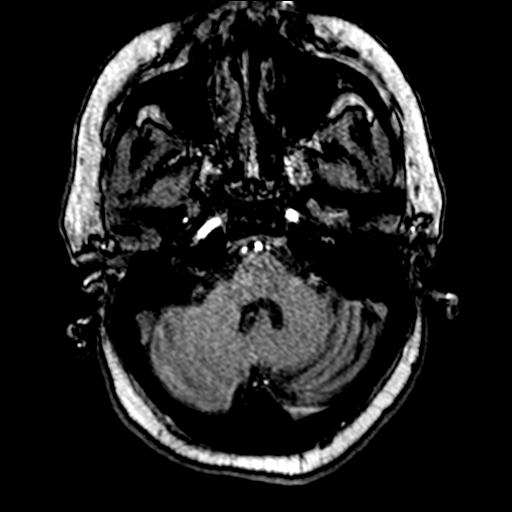
[im 37/172]
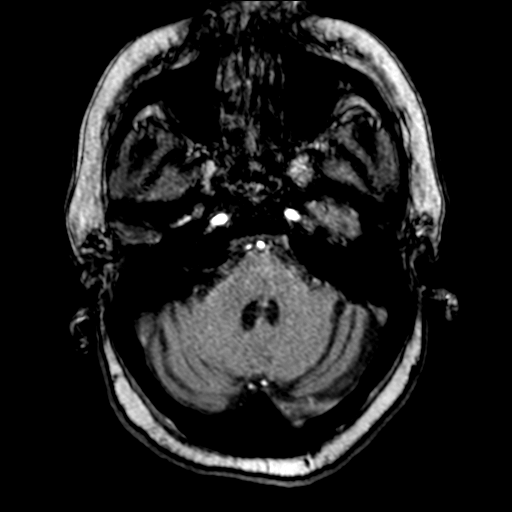
[im 41/172]
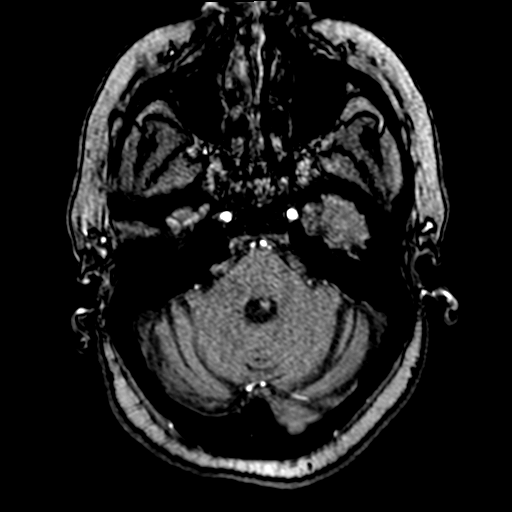
[im 55/172]
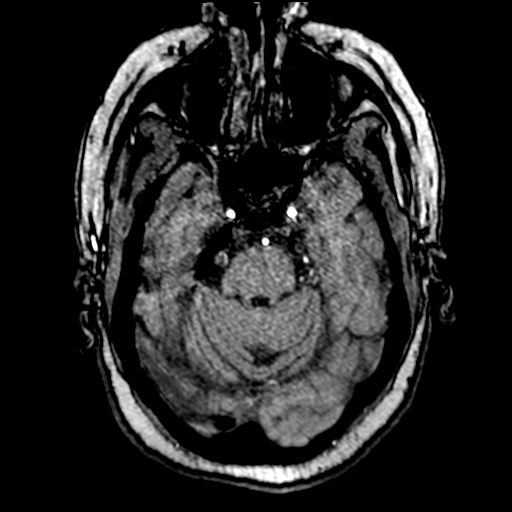
[im 77/172]
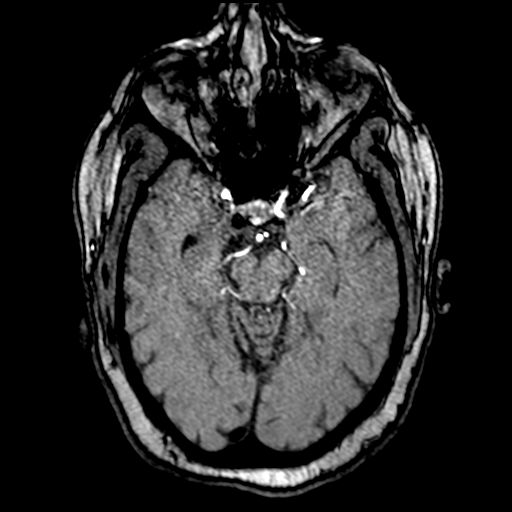
[im 88/172]
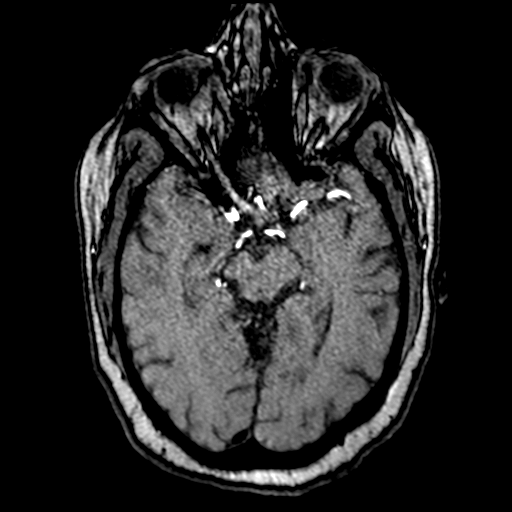
[im 99/172]
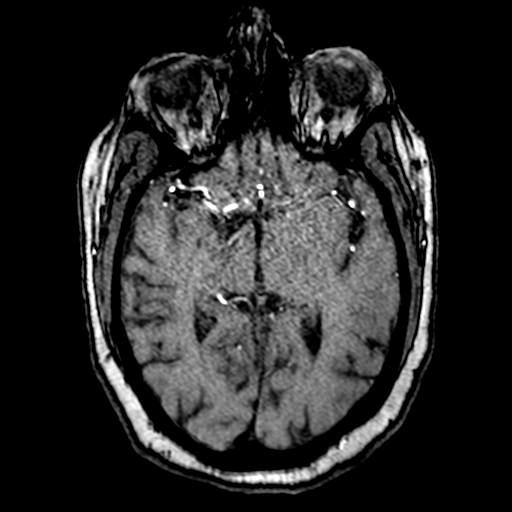
[im 121/172]
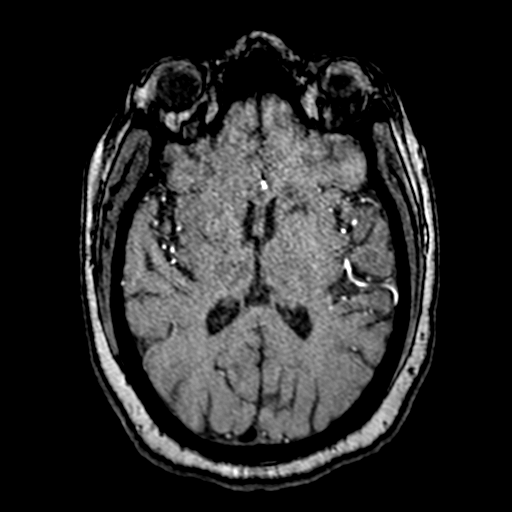
[im 142/172]
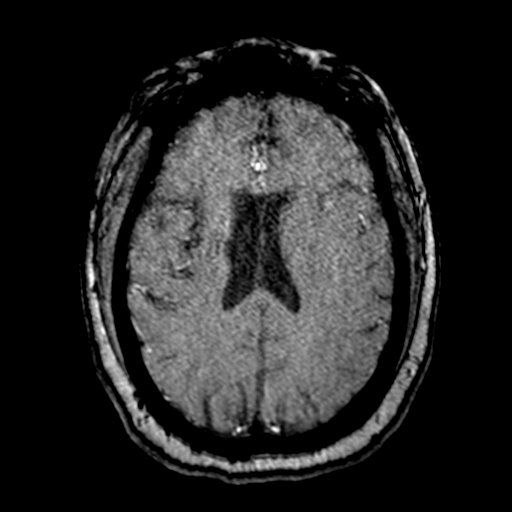
[im 146/172]
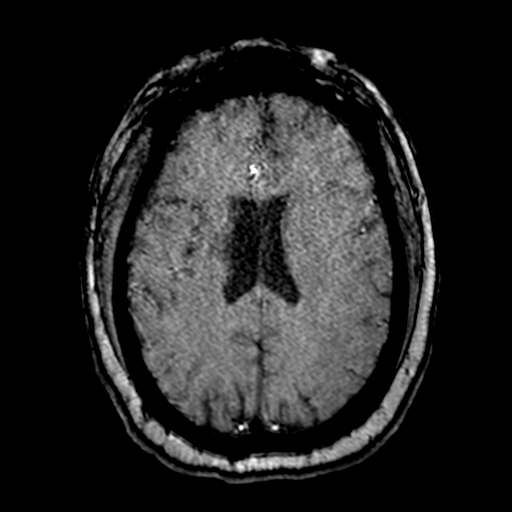
[im 164/172]
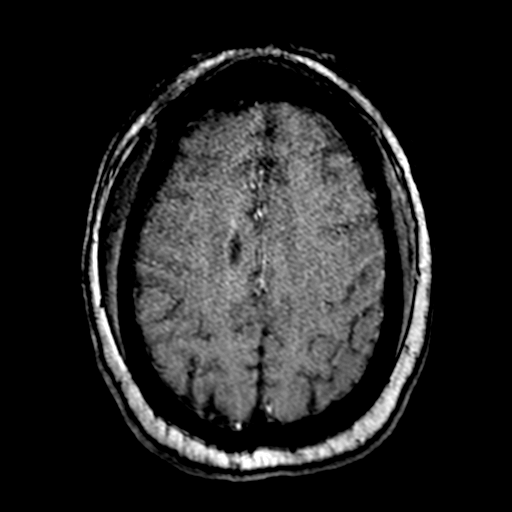

[20 of 48 positions shown; findings below may reference images not displayed]

FINDINGS: Motion degraded study.

Atheromatous irregularity of bilateral carotid siphons, accentuated
by skull base artifact. The right supraclinoid ICA measures
approximately 70% stenosis. There is also a prominent stenosis with
near flow gap at the petrous cavernous junction on the left.
Intracranial atheromatous type irregularity, most notably a
high-grade left proximal M2 branch narrowing.

No evidence of aneurysm.
IMPRESSION: 1. Advanced, especially for age, intracranial atheromatous change.
2. 70% right supraclinoid ICA stenosis and high-grade narrowing at
the left petrous cavernous junction.
3. Advanced left M2 segment stenosis.

## 2020-03-23 IMAGING — MR MR HEAD W/O CM
12 of 14 series · 43 of 48 positions shown · non-contrast
Comparison: None.

CLINICAL DATA: Right-sided numbness and pain

EXAM:
MRI HEAD WITHOUT CONTRAST
MRI CERVICAL SPINE WITHOUT CONTRAST
TECHNIQUE: Multiplanar, multiecho pulse sequences of the brain and surrounding
structures, and cervical spine, to include the craniocervical
junction and cervicothoracic junction, were obtained without
intravenous contrast.

[Series 5: DWI · axial · 3.0mm · 0.88mm/px · z∈[-129,+16]mm · 9 of 100 slices shown (1 of 4)]
[im 1/100]
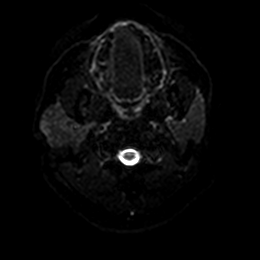
[im 13/100]
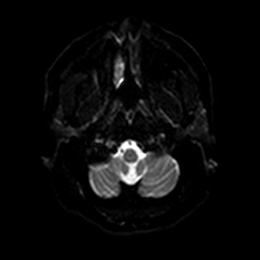
[im 25/100]
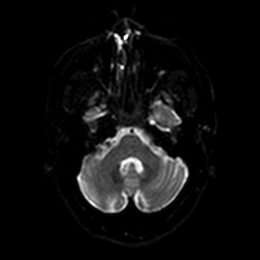
[im 38/100]
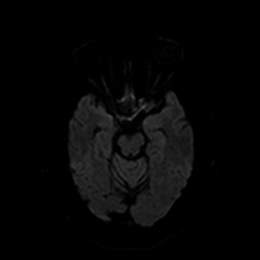
[im 50/100]
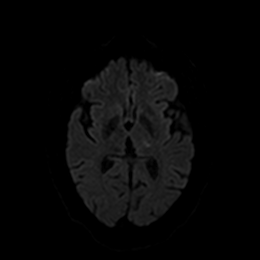
[im 62/100]
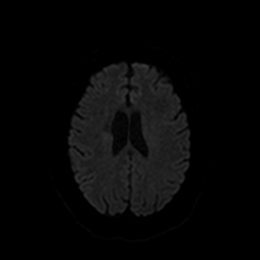
[im 75/100]
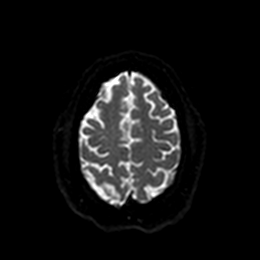
[im 87/100]
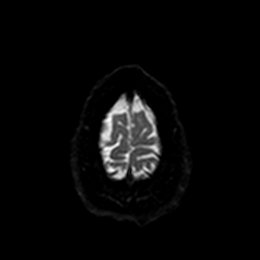
[im 100/100]
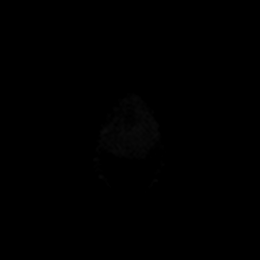

[Series 6: DWI · axial · 3.0mm · 0.88mm/px · z∈[-129,+16]mm · 4 of 50 slices shown (2 of 4)]
[im 1/50]
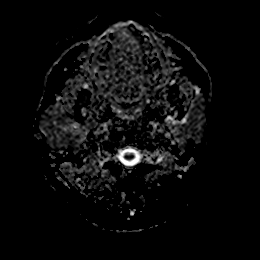
[im 17/50]
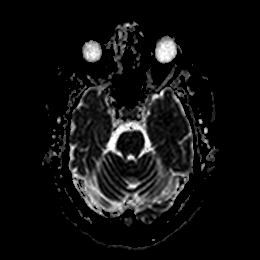
[im 33/50]
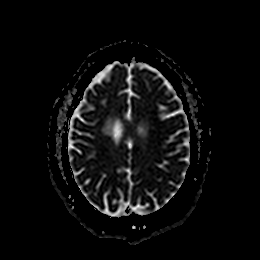
[im 50/50]
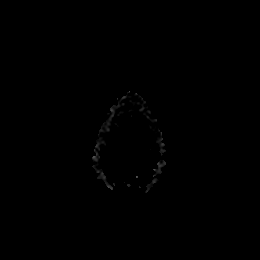

[Series 7: DWI · coronal · 4.0mm · 0.88mm/px · 5 of 64 slices shown (3 of 4)]
[im 1/64]
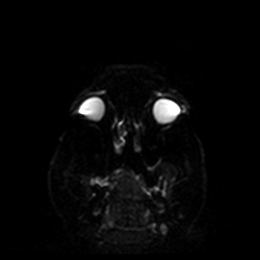
[im 16/64]
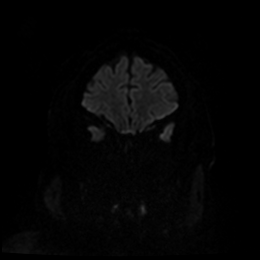
[im 32/64]
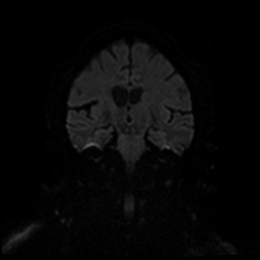
[im 48/64]
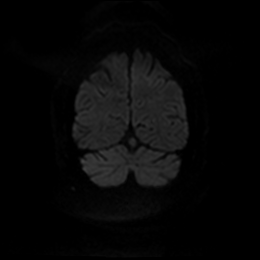
[im 64/64]
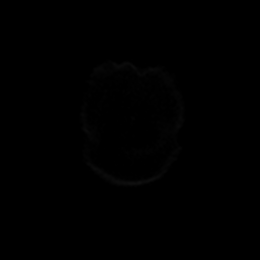

[Series 8: DWI · coronal · 4.0mm · 0.88mm/px · 2 of 32 slices shown (4 of 4)]
[im 1/32]
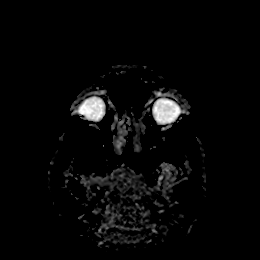
[im 32/32]
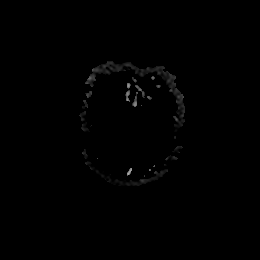

[Series 9: T1 · sagittal · 5.0mm · 0.72mm/px · 2 of 25 slices shown]
[im 1/25]
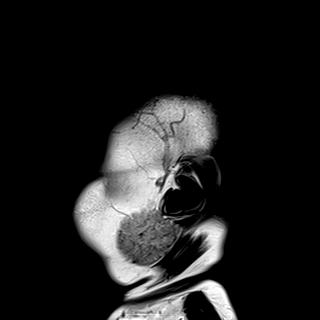
[im 25/25]
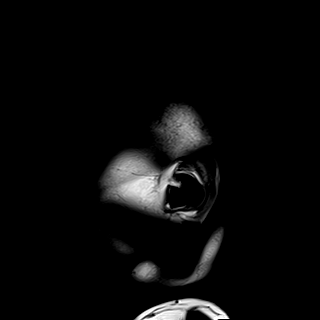

[Series 10: T2 · axial · 5.0mm · 0.72mm/px · z∈[-121,+22]mm · 2 of 25 slices shown (1 of 2)]
[im 1/25]
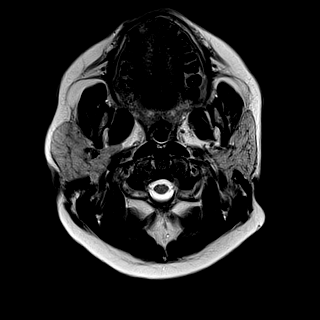
[im 25/25]
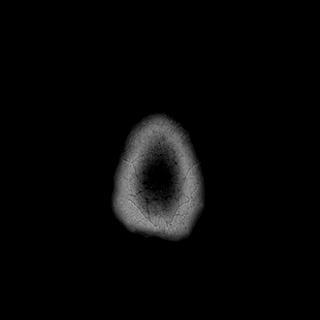

[Series 12: mag_images · axial · 3.0mm · 0.90mm/px · z∈[-125,+27]mm · 4 of 52 slices shown]
[im 1/52]
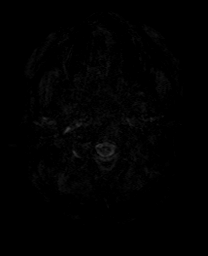
[im 18/52]
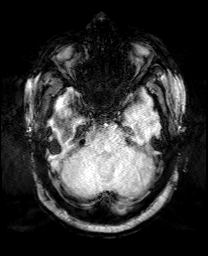
[im 35/52]
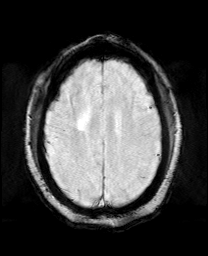
[im 52/52]
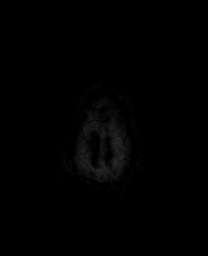

[Series 13: pha_images · axial · 3.0mm · 0.90mm/px · z∈[-125,+27]mm · 4 of 52 slices shown]
[im 1/52]
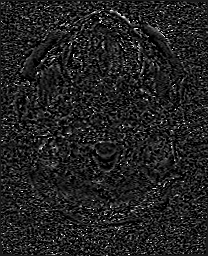
[im 18/52]
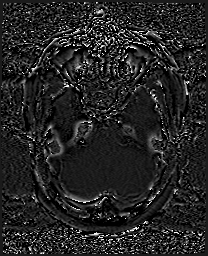
[im 35/52]
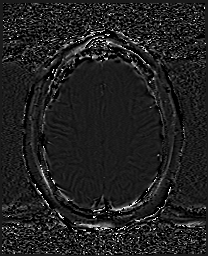
[im 52/52]
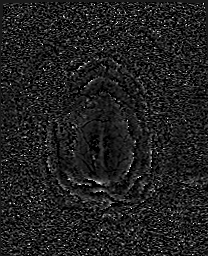

[Series 14: swi_images · axial · 3.0mm · 0.90mm/px · z∈[-125,+27]mm · 4 of 52 slices shown]
[im 1/52]
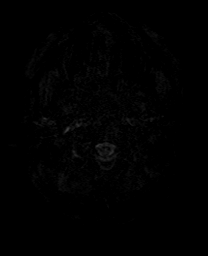
[im 18/52]
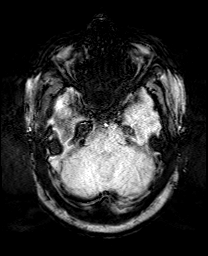
[im 35/52]
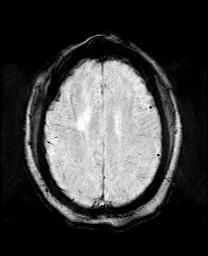
[im 52/52]
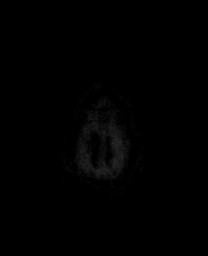

[Series 15: mip_images(sw) · axial · 24.0mm · 0.90mm/px · z∈[-114,+17]mm · 3 of 45 slices shown]
[im 1/45]
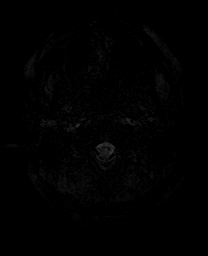
[im 23/45]
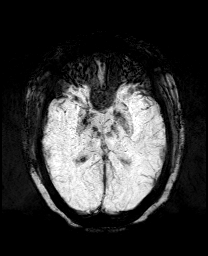
[im 45/45]
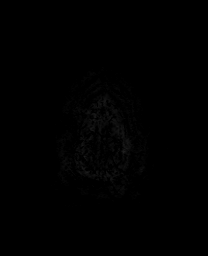

[Series 18: T2 · coronal · 5.0mm · 0.34mm/px · 2 of 30 slices shown (2 of 2)]
[im 1/30]
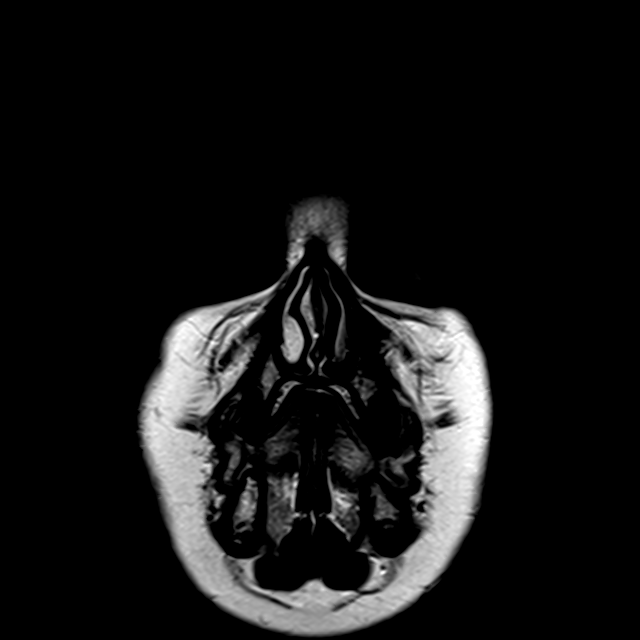
[im 30/30]
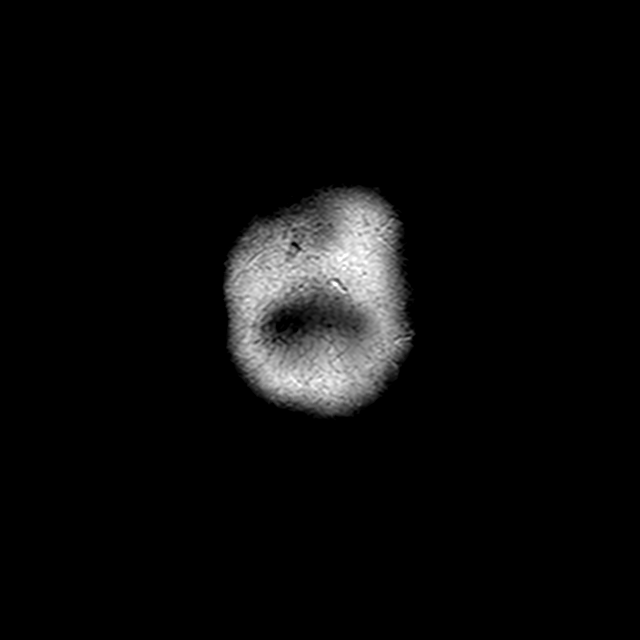

[Series 19: FLAIR · axial · 5.0mm · 0.90mm/px · z∈[-121,+22]mm · 2 of 25 slices shown]
[im 1/25]
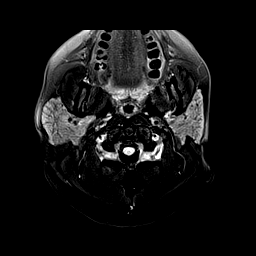
[im 25/25]
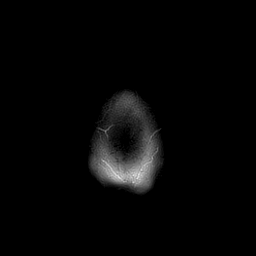

[43 of 48 positions shown; findings below may reference images not displayed]

FINDINGS: MRI HEAD FINDINGS

Brain: There is a small focus of abnormal diffusion restriction
within the left thalamus. Hyperintense T2-weighted signal within the
periventricular white matter, right greater than left. There are old
bilateral small vessel infarcts of the basal ganglia. Normal volume
of CSF spaces. No chronic microhemorrhage. Normal midline
structures.

Vascular: Normal flow voids.

Skull and upper cervical spine: Normal marrow signal.

Sinuses/Orbits: Negative.

Other: None.

MRI CERVICAL SPINE FINDINGS

Alignment: Physiologic.

Vertebrae: No fracture, evidence of discitis, or bone lesion.

Cord: Normal signal and morphology.

Posterior Fossa, vertebral arteries, paraspinal tissues: Negative.

Disc levels: C1-2: Unremarkable.

C2-3: Normal.

C3-4: Small left uncovertebral osteophyte.  No stenosis.

C4-5: Normal.

C5-6: Normal.

C6-7: Normal.

C7-T1: Normal.
IMPRESSION: 1. Small focus of acute/subacute ischemia within the left thalamus.
No hemorrhage or mass effect.
2. Old bilateral basal ganglia small vessel infarcts.
3. Normal cervical spinal cord.
4. Mild cervical degenerative disc disease without spinal canal or
neural foraminal stenosis.

## 2020-03-23 MED ORDER — ACETAMINOPHEN 160 MG/5ML PO SOLN: 650.0000 mg | ORAL | Status: DC | PRN

## 2020-03-23 MED ORDER — ISOSORBIDE MONONITRATE ER 30 MG PO TB24
30.0000 mg | ORAL_TABLET | Freq: Every day | ORAL | Status: DC
  Administered 2020-03-23 – 2020-03-24 (×2): 30 mg via ORAL
  Filled 2020-03-23 (×2): qty 1

## 2020-03-23 MED ORDER — SODIUM CHLORIDE 0.9 % IV BOLUS
1000.0000 mL | Freq: Once | INTRAVENOUS | Status: AC
  Administered 2020-03-23: 1000 mL via INTRAVENOUS

## 2020-03-23 MED ORDER — ASPIRIN 81 MG PO CHEW
81.0000 mg | CHEWABLE_TABLET | Freq: Every day | ORAL | Status: DC
  Administered 2020-03-23 – 2020-03-24 (×2): 81 mg via ORAL
  Filled 2020-03-23 (×2): qty 1

## 2020-03-23 MED ORDER — CLONIDINE HCL 0.1 MG PO TABS
0.1000 mg | ORAL_TABLET | Freq: Two times a day (BID) | ORAL | Status: DC
  Administered 2020-03-23 – 2020-03-24 (×3): 0.1 mg via ORAL
  Filled 2020-03-23 (×3): qty 1

## 2020-03-23 MED ORDER — ENOXAPARIN SODIUM 40 MG/0.4ML ~~LOC~~ SOLN
40.0000 mg | SUBCUTANEOUS | Status: DC
  Administered 2020-03-23 – 2020-03-24 (×2): 40 mg via SUBCUTANEOUS
  Filled 2020-03-23 (×2): qty 0.4

## 2020-03-23 MED ORDER — ACETAMINOPHEN 325 MG PO TABS: 650.0000 mg | ORAL_TABLET | ORAL | Status: DC | PRN

## 2020-03-23 MED ORDER — PANTOPRAZOLE SODIUM 40 MG PO TBEC
40.0000 mg | DELAYED_RELEASE_TABLET | Freq: Two times a day (BID) | ORAL | Status: DC
  Administered 2020-03-23 – 2020-03-24 (×3): 40 mg via ORAL
  Filled 2020-03-23 (×3): qty 1

## 2020-03-23 MED ORDER — MONTELUKAST SODIUM 10 MG PO TABS
10.0000 mg | ORAL_TABLET | Freq: Every day | ORAL | Status: DC
  Administered 2020-03-23: 10 mg via ORAL
  Filled 2020-03-23 (×2): qty 1

## 2020-03-23 MED ORDER — ALBUTEROL SULFATE (2.5 MG/3ML) 0.083% IN NEBU: 3.0000 mL | INHALATION_SOLUTION | Freq: Four times a day (QID) | RESPIRATORY_TRACT | Status: DC | PRN

## 2020-03-23 MED ORDER — QUETIAPINE FUMARATE 100 MG PO TABS
200.0000 mg | ORAL_TABLET | Freq: Every day | ORAL | Status: DC
  Administered 2020-03-23 – 2020-03-24 (×2): 200 mg via ORAL
  Filled 2020-03-23: qty 1
  Filled 2020-03-23: qty 2

## 2020-03-23 MED ORDER — GABAPENTIN 300 MG PO CAPS
300.0000 mg | ORAL_CAPSULE | Freq: Three times a day (TID) | ORAL | Status: DC
  Administered 2020-03-23 – 2020-03-24 (×4): 300 mg via ORAL
  Filled 2020-03-23 (×4): qty 1

## 2020-03-23 MED ORDER — ACETAMINOPHEN 650 MG RE SUPP: 650.0000 mg | RECTAL | Status: DC | PRN

## 2020-03-23 MED ORDER — LORAZEPAM 2 MG/ML IJ SOLN
0.5000 mg | Freq: Once | INTRAMUSCULAR | Status: AC
  Administered 2020-03-23: 0.5 mg via INTRAVENOUS
  Filled 2020-03-23: qty 1

## 2020-03-23 MED ORDER — HYDROCODONE-ACETAMINOPHEN 5-325 MG PO TABS
1.0000 | ORAL_TABLET | Freq: Four times a day (QID) | ORAL | Status: DC | PRN
  Administered 2020-03-23 – 2020-03-24 (×3): 1 via ORAL
  Filled 2020-03-23 (×3): qty 1

## 2020-03-23 MED ORDER — CARVEDILOL 6.25 MG PO TABS
18.7500 mg | ORAL_TABLET | Freq: Two times a day (BID) | ORAL | Status: DC
  Administered 2020-03-23 – 2020-03-24 (×3): 18.75 mg via ORAL
  Filled 2020-03-23: qty 6
  Filled 2020-03-23 (×2): qty 1

## 2020-03-23 MED ORDER — STROKE: EARLY STAGES OF RECOVERY BOOK: Freq: Once | Status: DC

## 2020-03-23 MED ORDER — INSULIN ASPART PROT & ASPART (70-30 MIX) 100 UNIT/ML ~~LOC~~ SUSP
35.0000 [IU] | Freq: Two times a day (BID) | SUBCUTANEOUS | Status: DC
  Administered 2020-03-23 – 2020-03-24 (×3): 35 [IU] via SUBCUTANEOUS
  Filled 2020-03-23 (×2): qty 10

## 2020-03-23 MED ORDER — QUETIAPINE FUMARATE 100 MG PO TABS
400.0000 mg | ORAL_TABLET | Freq: Every day | ORAL | Status: DC
  Administered 2020-03-23: 400 mg via ORAL
  Filled 2020-03-23: qty 1
  Filled 2020-03-23: qty 4

## 2020-03-23 MED ORDER — INSULIN ASPART 100 UNIT/ML ~~LOC~~ SOLN
5.0000 [IU] | Freq: Once | SUBCUTANEOUS | Status: AC
  Administered 2020-03-23: 5 [IU] via SUBCUTANEOUS

## 2020-03-23 MED ORDER — INSULIN ASPART 100 UNIT/ML ~~LOC~~ SOLN
0.0000 [IU] | Freq: Every day | SUBCUTANEOUS | Status: DC
  Administered 2020-03-23: 5 [IU] via SUBCUTANEOUS

## 2020-03-23 MED ORDER — INSULIN ASPART 100 UNIT/ML ~~LOC~~ SOLN
0.0000 [IU] | Freq: Three times a day (TID) | SUBCUTANEOUS | Status: DC
  Administered 2020-03-23: 8 [IU] via SUBCUTANEOUS
  Administered 2020-03-23: 5 [IU] via SUBCUTANEOUS
  Administered 2020-03-23: 15 [IU] via SUBCUTANEOUS
  Administered 2020-03-24: 8 [IU] via SUBCUTANEOUS
  Administered 2020-03-24: 11 [IU] via SUBCUTANEOUS

## 2020-03-23 MED ORDER — TICAGRELOR 90 MG PO TABS
90.0000 mg | ORAL_TABLET | Freq: Two times a day (BID) | ORAL | Status: DC
  Administered 2020-03-23 – 2020-03-24 (×3): 90 mg via ORAL
  Filled 2020-03-23 (×3): qty 1

## 2020-03-23 MED ORDER — ATORVASTATIN CALCIUM 80 MG PO TABS
80.0000 mg | ORAL_TABLET | Freq: Every day | ORAL | Status: DC
  Administered 2020-03-23: 80 mg via ORAL
  Filled 2020-03-23: qty 1

## 2020-03-23 MED ORDER — NICOTINE 14 MG/24HR TD PT24
14.0000 mg | MEDICATED_PATCH | Freq: Every day | TRANSDERMAL | Status: DC
  Administered 2020-03-23 – 2020-03-24 (×2): 14 mg via TRANSDERMAL
  Filled 2020-03-23 (×2): qty 1

## 2020-03-23 NOTE — Plan of Care (Signed)
Patient was seen by neurology Dr. Derry Lory this morning, please refer to consult note.  Briefly, 49 year old female with history of diabetes, hypertension, hyperlipidemia, smoker, cocaine use, CAD/MI in 11/2019 presented for right-sided numbness 3 days.  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  She still has hyperglycemia.  Further stroke work-up underway, including MRA, carotid Doppler, 2D echo, LDL, A1c and UDS.  She is taking aspirin and Brilinta at home, will continue.  Also continue high-dose statin.  Will follow along with stroke work up.  Marvel Plan, MD PhD Stroke Neurology 03/23/2020 5:33 PM

## 2020-03-23 NOTE — Consult Note (Signed)
NEUROLOGY CONSULTATION NOTE   Date of service: March 23, 2020 Patient Name: Maria Harper MRN:  [id number] DOB:  20-Apr-1971 Reason for consult: "R sided numbness"  History of Present Illness  Maria Harper is a 49 y.o. female with PMH significant for GERD, cocaine use, DM2, HTN, HLD who presents with R sided numbness. She went to bed on 03/19/2020 at 2100 and woke up at 5 AM the next day with right-sided numbness.  No prior history of similar symptoms.  No personal or family history of strokes.  She does not take aspirin at home.  She smokes about half a pack of cigarettes a day. She initially did not want to come to the ED but her symptoms persisted for 3 days and she eventually came to the ED. Workup in the ED with [facility] and MRI Brain which demonstrated an acute L thalamic stroke.  She denies any dysarthric speech, facial droop, no arm or leg weakness, no disequilibrium, no visual field deficit.  She endorses poor dietary habits and eat what ever she wants to.  She does not monitor her glucose at home.   ROS   Constitutional Denies weight loss, fever and chills.  HEENT Denies changes in vision and hearing.  Respiratory Denies SOB and cough.  CV Denies palpitations and CP  GI Denies abdominal pain, nausea, vomiting and diarrhea.  GU Denies dysuria and urinary frequency.  MSK Denies myalgia and joint pain.  Skin Denies rash and pruritus.  Neurological Denies headache and syncope.  Psychiatric Denies recent changes in mood. Denies anxiety and depression.   Past History   Past Medical History:  Diagnosis Date  . Acid reflux   . Cocaine abuse (HCC)   . Diabetes mellitus without complication (HCC)   . Heart attack (HCC) 04/2016  . High cholesterol   . Hypertension    Past Surgical History:  Procedure Laterality Date  . CORONARY/GRAFT ACUTE MI REVASCULARIZATION N/A 11/23/2019   Procedure: CORONARY/GRAFT ACUTE MI REVASCULARIZATION;  Surgeon: [name], MD;  Location: [facility]  INVASIVE CV LAB;  Service: Cardiovascular;  Laterality: N/A;  . LEFT HEART CATH AND CORONARY ANGIOGRAPHY N/A 11/23/2019   Procedure: LEFT HEART CATH AND CORONARY ANGIOGRAPHY;  Surgeon: [name], MD;  Location: [facility] INVASIVE CV LAB;  Service: Cardiovascular;  Laterality: N/A;   Family History  Problem Relation Age of Onset  . Diabetes Mother   . Diabetes Father   . Diabetes Maternal Grandmother   . Diabetes Maternal Grandfather    Social History   Socioeconomic History  . Marital status: Married    Spouse name: Not on file  . Number of children: Not on file  . Years of education: Not on file  . Highest education level: Not on file  Occupational History  . Not on file  Tobacco Use  . Smoking status: Current Every Day Smoker    Packs/day: 1.50    Years: 20.00    Pack years: 30.00    Types: Cigarettes  . Smokeless tobacco: Never Used  Vaping Use  . Vaping Use: Never used  Substance and Sexual Activity  . Alcohol use: Not Currently  . Drug use: Yes    Types: "Crack" cocaine    Comment: last used yesterday. States " I do it everyday"   . Sexual activity: Not on file  Other Topics Concern  . Not on file  Social History Narrative  . Not on file   Social Determinants of Health   Financial Resource Strain:   .  Difficulty of Paying Living Expenses: Not on file  Food Insecurity:   . Worried About Programme researcher, broadcasting/film/video in the Last Year: Not on file  . Ran Out of Food in the Last Year: Not on file  Transportation Needs:   . Lack of Transportation (Medical): Not on file  . Lack of Transportation (Non-Medical): Not on file  Physical Activity:   . Days of Exercise per Week: Not on file  . Minutes of Exercise per Session: Not on file  Stress:   . Feeling of Stress : Not on file  Social Connections:   . Frequency of Communication with Friends and Family: Not on file  . Frequency of Social Gatherings with Friends and Family: Not on file  . Attends Religious Services: Not on  file  . Active Member of Clubs or Organizations: Not on file  . Attends Banker Meetings: Not on file  . Marital Status: Not on file   Allergies  Allergen Reactions  . Bee Venom Anaphylaxis  . Contrast Media [Iodinated Diagnostic Agents] Hives and Itching  . Tomato Itching and Rash    Medications  (Not in a hospital admission)    Vitals  Temp:  [97.8 F (36.6 C)-98.8 F (37.1 C)] 97.8 F (36.6 C) (08/22 0012) Pulse Rate:  [92-107] 99 (08/22 0012) Resp:  [16-18] 16 (08/22 0012) BP: (134-147)/(80-88) 147/82 (08/22 0012) SpO2:  [97 %-100 %] 97 % (08/22 0012) Weight:  [83.5 kg] 83.5 kg (08/21 1203)  Body mass index is 33.65 kg/m.  Physical Exam   General: Laying comfortably in bed; in no acute distress.  HENT: Normal oropharynx and mucosa. Normal external appearance of ears and nose. Neck: Supple, no pain or tenderness CV: No JVD. No peripheral edema. Pulmonary: Symmetric Chest rise. Normal respiratory effort. Abdomen: Soft to touch, non-tender Ext: No cyanosis, edema, or deformity  Skin: No rash. Normal palpation of skin.   Musculoskeletal: Normal digits and nails by inspection. No clubbing.  Neurologic Examination  Mental status/Cognition: Alert, oriented to self, place, month and year, good attention. Speech/language: Fluent, comprehension intact, object naming intact, repetition intact. Cranial nerves:   CN II Pupils equal and reactive to light, no VF deficits   CN III,IV,VI EOM intact, no gaze preference or deviation, no nystagmus   CN V  decreased to touch in right face.   CN VII no asymmetry, no nasolabial fold flattening   CN VIII normal hearing to speech   CN IX & X normal palatal elevation, no uvular deviation   CN XI 5/5 head turn and 5/5 shoulder shrug bilaterally   CN XII midline tongue protrusion   Motor:  Muscle bulk: Normal, tone normal, pronator drift none Mvmt Root Nerve  Muscle Right Left Comments  SA C5/6 Ax Deltoid  5 5   EF C5/6  Mc Biceps  5 5   EE C6/7/8 Rad Triceps  5 5   WF C6/7 Med FCR 5 5   WE C7/8 PIN ECU 5 5   F Ab C8/T1 U ADM/FDI 5 5   HF L1/2/3 Fem Illopsoas 4+ 5   KE L2/3/4 Fem Quad 5 5   DF L4/5 D Peron Tib Ant 5 5   PF S1/2 Tibial Grc/Sol 5 5    Reflexes:  Right Left Comments  Pectoralis      Biceps (C5/6) 2 2   Brachioradialis (C5/6) 2 2    Triceps (C6/7) 2 2    Patellar (L3/4) 0 0  Achilles (S1) 1 1    Hoffman      Plantar     Jaw jerk    Sensation:  Light touch Decreased in R face, R arm and R leg   Pin prick Decreased in R face, R arm and R leg   Temperature    Vibration   Proprioception    Coordination/Complex Motor:  - Finger to Nose intact BL - Heel to shin intact BL - Rapid alternating movement normal - Gait: did not assess. Normal base width with standing up.  Labs   Lab Results  Component Value Date   NA 136 03/22/2020   K 4.2 03/22/2020   CL 106 03/22/2020   CO2 17 (L) 03/22/2020   GLUCOSE 359 (H) 03/22/2020   BUN 13 03/22/2020   CREATININE 0.80 03/22/2020   CALCIUM 9.2 03/22/2020   ALBUMIN 3.8 03/22/2020   AST 14 (L) 03/22/2020   ALT 15 03/22/2020   ALKPHOS 69 03/22/2020   BILITOT 0.3 03/22/2020   GFRNONAA 56 (L) 03/22/2020   GFRAA >60 03/22/2020     Imaging and Diagnostic studies  CT head with no acute intracranial abnormality MRI brain without contrast with a small focus of acute/subacute stroke within the left thalamus.  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 MRI cervical spine with no significant spinal canal narrowing or neural foraminal stenosis.  Impression   Maria Harper is a 49 y.o. female with PMH significant for poorly controlled diabetes, hypertension, hyperlipidemia, smoker who presents with 3-day history of right-sided numbness which is persistent.  MRI brain with a left thalamic stroke which appears to be more consistent with a small vessel infarct.. Her neurologic examination is notable for right-sided  numbness.  We will do stroke work-up and modify stroke risk factors.  Recommendations   - Vascular imaging - with MRA head and Carotid doppler - TTE - Lipid panel - Statin - will be started if LDL>70 or otherwise medically indicated - A1C - Antithrombotic - Aspirin 81mg  daily - DVT ppx - Lovenox - Smoking cessation - will counsel patient - SBP goal - more than 3 days out from stroke symptom onset, aim for gradual normotension. - Telemetry monitoring for arrhythmia - 72h - Swallow screen - will be performed prior to PO intake - Stroke education - will be given - PT/OT/SLP consult. ______________________________________________________________________   Thank you for the opportunity to take part in the care of this patient. If you have any further questions, please contact the neurology consultation attending.  Signed,  [facility] Pager Number [phone]

## 2020-03-23 NOTE — Progress Notes (Signed)
  Echocardiogram 2D Echocardiogram has been performed.  Maria Harper F 03/23/2020, 3:14 PM

## 2020-03-23 NOTE — ED Notes (Signed)
Phlebotomy attempted blood draw x2 without success, pt stated she wanted to wait a little bit before letting someone else try

## 2020-03-23 NOTE — ED Provider Notes (Signed)
[facility] EMERGENCY DEPARTMENT Provider Note   CSN: [id number] Arrival date & time: 03/22/20  1200     History No chief complaint on file.   Maria Harper is a 49 y.o. female with a hx of acid reflux, cocaine abuse, insulin-dependent diabetes, MI, high cholesterol, hypertension presents to the Emergency Department complaining of acute, persistent pain, numbness and weakness to the right arm and leg onset 3 days ago.  Patient reports she awoke in the morning with the symptoms.  He reports associated paresthesias on the right arm and leg along with the right side of her abdomen.  Patient states she is also had some ongoing neck and low back pain for the last several months.  Reports she is been taking prednisone prescribed by her primary care doctor with her last dose 2 weeks ago.  Patient reports the pain goes away after prednisone administration but returned when she is not taking it.  Patient reports that despite having had pain previously, he has never had paresthesias, numbness or weakness.  Patient denies vision changes, speech changes.  Patient reports she is able to walk but this causes significant pain in her right leg and foot.  She does have a history of diabetic neuropathy for which she takes gabapentin however reports that this pain is different from her normal neuropathy pain.  Movement and palpation seem to make the pain in her arm and leg worse.  Nothing makes it better.  Nothing affects the weakness.  Patient denies fevers or chills, neck stiffness, rash, IV drug use, known cancer.   Patient blood sugar elevated here in the emergency department.  She reports she is not taken her insulin this evening as she was here in the emergency room   The history is provided by the patient and medical records. No language interpreter was used.       Past Medical History:  Diagnosis Date  . Acid reflux   . Cocaine abuse (HCC)   . Diabetes mellitus without  complication (HCC)   . Heart attack (HCC) 04/2016  . High cholesterol   . Hypertension     Patient Active Problem List   Diagnosis Date Noted  . Acute CVA (cerebrovascular accident) (HCC) 03/23/2020  . Hypertension 11/28/2019  . UTI (urinary tract infection) 11/28/2019  . Hyperlipidemia with target LDL less than 70   . Tobacco abuse   . Type II diabetes mellitus with neurological manifestations, uncontrolled (HCC) 11/25/2019  . Acute ST elevation myocardial infarction (STEMI) of anterior wall (HCC) 11/23/2019  . CAD S/P percutaneous coronary angioplasty 11/23/2019  . Acute ST elevation myocardial infarction (STEMI) of anteroseptal wall (HCC) 11/23/2019  . Sepsis (HCC) 09/19/2019  . Cocaine abuse with cocaine-induced mood disorder (HCC) 06/10/2017    Past Surgical History:  Procedure Laterality Date  . CORONARY/GRAFT ACUTE MI REVASCULARIZATION N/A 11/23/2019   Procedure: CORONARY/GRAFT ACUTE MI REVASCULARIZATION;  Surgeon: [name], MD;  Location: [facility] INVASIVE CV LAB;  Service: Cardiovascular;  Laterality: N/A;  . LEFT HEART CATH AND CORONARY ANGIOGRAPHY N/A 11/23/2019   Procedure: LEFT HEART CATH AND CORONARY ANGIOGRAPHY;  Surgeon: [name], MD;  Location: [facility] INVASIVE CV LAB;  Service: Cardiovascular;  Laterality: N/A;     OB History   No obstetric history on file.     Family History  Problem Relation Age of Onset  . Diabetes Mother   . Diabetes Father   . Diabetes Maternal Grandmother   . Diabetes Maternal Grandfather  Social History   Tobacco Use  . Smoking status: Current Every Day Smoker    Packs/day: 1.50    Years: 20.00    Pack years: 30.00    Types: Cigarettes  . Smokeless tobacco: Never Used  Vaping Use  . Vaping Use: Never used  Substance Use Topics  . Alcohol use: Not Currently  . Drug use: Yes    Types: "Crack" cocaine    Comment: last used yesterday. States " I do it everyday"     Home Medications Prior to Admission medications    Medication Sig Start Date End Date Taking? Authorizing Provider  albuterol (VENTOLIN HFA) 108 (90 Base) MCG/ACT inhaler Inhale 2 puffs into the lungs every 6 (six) hours as needed for wheezing or shortness of breath.   Yes [provider]  aspirin 81 MG chewable tablet Chew 81 mg by mouth daily.   Yes [provider]  atorvastatin (LIPITOR) 80 MG tablet Take 80 mg by mouth daily at 6 PM.  04/05/18  Yes [provider]  carvedilol (COREG) 12.5 MG tablet Take 1.5 tablets (18.75 mg total) by mouth 2 (two) times daily with a meal. 11/28/19  Yes Marjie Skiff E, PA-C  cloNIDine (CATAPRES) 0.1 MG tablet Take 0.1 mg by mouth 2 (two) times daily. 12/20/17  Yes [provider]  cyclobenzaprine (FLEXERIL) 10 MG tablet Take 10 mg by mouth 3 (three) times daily as needed for muscle spasms.    Yes [provider]  gabapentin (NEURONTIN) 300 MG capsule Take 300 mg by mouth 3 (three) times daily.   Yes [provider]  glipiZIDE (GLUCOTROL) 5 MG tablet Take 5 mg by mouth 2 (two) times daily before a meal.  12/20/17  Yes [provider]  HUMULIN R 100 UNIT/ML injection Inject 17 Units into the skin 2 (two) times daily before a meal.  06/13/18  Yes [provider]  insulin NPH-regular Human (HUMULIN 70/30) (70-30) 100 UNIT/ML injection Inject 35 Units into the skin 2 (two) times daily.  07/01/17  Yes [provider]  isosorbide mononitrate (IMDUR) 30 MG 24 hr tablet Take 1 tablet (30 mg total) by mouth daily. 11/29/19  Yes Marjie Skiff E, PA-C  losartan (COZAAR) 25 MG tablet Take 1 tablet (25 mg total) by mouth daily. 11/29/19  Yes Irene Limbo, Callie E, PA-C  montelukast (SINGULAIR) 10 MG tablet Take 10 mg by mouth at bedtime.   Yes [provider]  nitroGLYCERIN (NITROSTAT) 0.4 MG SL tablet Place 1 tablet (0.4 mg total) under the tongue as needed for chest pain. 11/28/19  Yes Marjie Skiff E, PA-C  pantoprazole (PROTONIX) 40  MG tablet Take 40 mg by mouth 2 (two) times daily.    Yes [provider]  polyethylene glycol powder (GLYCOLAX/MIRALAX) powder Take 17 g by mouth daily as needed for moderate constipation.  04/05/18  Yes [provider]  QUEtiapine (SEROQUEL) 200 MG tablet Take 200 mg by mouth daily.  04/05/18  Yes [provider]  QUEtiapine (SEROQUEL) 400 MG tablet Take 400 mg by mouth at bedtime. 04/05/18  Yes [provider]  ticagrelor (BRILINTA) 90 MG TABS tablet Take 1 tablet (90 mg total) by mouth 2 (two) times daily. 11/28/19  Yes Marjie Skiff E, PA-C  cephALEXin (KEFLEX) 500 MG capsule Take 1 capsule (500 mg total) by mouth every 6 (six) hours. Patient not taking: Reported on 03/23/2020 11/28/19   Corrin Parker, PA-C  cephALEXin (KEFLEX) 500 MG capsule Take 1 capsule (500  mg total) by mouth 4 (four) times daily. Patient not taking: Reported on 03/23/2020 02/06/20   Vanetta Mulders, MD  HYDROcodone-acetaminophen (NORCO/VICODIN) 5-325 MG tablet Take 1 tablet by mouth every 6 (six) hours as needed for moderate pain. Patient not taking: Reported on 03/23/2020 02/06/20   Vanetta Mulders, MD  nicotine (NICODERM CQ - DOSED IN MG/24 HOURS) 14 mg/24hr patch Place 1 patch (14 mg total) onto the skin daily. Patient not taking: Reported on 03/23/2020 11/29/19   Corrin Parker, PA-C    Allergies    Bee venom, Contrast media [iodinated diagnostic agents], and Tomato  Review of Systems   Review of Systems  Constitutional: Negative for appetite change, diaphoresis, fatigue, fever and unexpected weight change.  HENT: Negative for mouth sores.   Eyes: Negative for visual disturbance.  Respiratory: Negative for cough, chest tightness, shortness of breath and wheezing.   Cardiovascular: Negative for chest pain.  Gastrointestinal: Negative for abdominal pain, constipation, diarrhea, nausea and vomiting.  Endocrine: Negative for polydipsia, polyphagia and polyuria.  Genitourinary:  Negative for dysuria, frequency, hematuria and urgency.  Musculoskeletal: Negative for back pain and neck stiffness.  Skin: Negative for rash.  Allergic/Immunologic: Negative for immunocompromised state.  Neurological: Positive for weakness and numbness. Negative for dizziness, syncope, facial asymmetry, light-headedness and headaches.       Paresthesias  Hematological: Does not bruise/bleed easily.  Psychiatric/Behavioral: Negative for sleep disturbance. The patient is not nervous/anxious.     Physical Exam Updated Vital Signs BP (!) 147/82   Pulse 99   Temp 97.8 F (36.6 C) (Oral)   Resp 16   Ht 5\' 2"  (1.575 m)   Wt 83.5 kg   SpO2 97%   BMI 33.65 kg/m   Physical Exam Vitals and nursing note reviewed.  Constitutional:      General: She is not in acute distress.    Appearance: She is well-developed. She is not diaphoretic.  HENT:     Head: Normocephalic and atraumatic.  Eyes:     General: No scleral icterus.    Conjunctiva/sclera: Conjunctivae normal.     Pupils: Pupils are equal, round, and reactive to light.     Comments: No horizontal, vertical or rotational nystagmus  Neck:     Comments: Full active and passive ROM without pain No midline or paraspinal tenderness No nuchal rigidity or meningeal signs Cardiovascular:     Rate and Rhythm: Normal rate and regular rhythm.  Pulmonary:     Effort: Pulmonary effort is normal. No respiratory distress.     Breath sounds: Normal breath sounds. No wheezing or rales.  Abdominal:     General: Bowel sounds are normal.     Palpations: Abdomen is soft.     Tenderness: There is no abdominal tenderness. There is no guarding or rebound.  Musculoskeletal:        General: Normal range of motion.     Cervical back: Normal range of motion and neck supple.     Comments: Tenderness to palpation throughout the entirety of the right upper extremity and right lower extremity without swelling, bruising, deformity.  No decreased range of  motion of any joint.  Lymphadenopathy:     Cervical: No cervical adenopathy.  Skin:    General: Skin is warm and dry.     Findings: No rash.  Neurological:     Mental Status: She is alert and oriented to person, place, and time.     Cranial Nerves: No cranial nerve deficit.  Motor: No abnormal muscle tone.     Coordination: Coordination normal.     Comments: Mental Status:  Alert, oriented, thought content appropriate. Speech fluent without evidence of aphasia. Able to follow 2 step commands without difficulty.  Cranial Nerves:  II:  Peripheral visual fields grossly normal, pupils equal, round, reactive to light III,IV, VI: ptosis not present, extra-ocular motions intact bilaterally  V,VII: smile symmetric, facial light touch sensation equal VIII: hearing grossly normal bilaterally  IX,X: midline uvula rise  XI: bilateral shoulder shrug equal and strong XII: midline tongue extension  Motor:  5/5 in left upper and lower extremities including grip strength and dorsiflexion/plantar flexion 4/5 in right upper and lower extremities including grip strength and dorsiflexion/plantar flexion Sensory: Pinprick and light touch decreased in the right upper and right lower extremities.  Cerebellar: normal finger-to-nose with bilateral upper extremities Gait: Patient just slightly off balance, no foot drop. CV: distal pulses palpable throughout   Psychiatric:        Behavior: Behavior normal.        Thought Content: Thought content normal.        Judgment: Judgment normal.     ED Results / Procedures / Treatments   Labs (all labs ordered are listed, but only abnormal results are displayed) Labs Reviewed  CBC - Abnormal; Notable for the following components:      Result Value   RBC 3.49 (*)    Hemoglobin 11.0 (*)    HCT 35.0 (*)    MCV 100.3 (*)    RDW 15.6 (*)    All other components within normal limits  COMPREHENSIVE METABOLIC PANEL - Abnormal; Notable for the following  components:   CO2 17 (*)    Glucose, Bld 354 (*)    Creatinine, Ser 1.16 (*)    AST 14 (*)    GFR calc non Af Amer 56 (*)    Anion gap 16 (*)    All other components within normal limits  I-STAT CHEM 8, ED - Abnormal; Notable for the following components:   Glucose, Bld 359 (*)    Calcium, Ion 1.14 (*)    TCO2 19 (*)    Hemoglobin 11.6 (*)    HCT 34.0 (*)    All other components within normal limits  CBG MONITORING, ED - Abnormal; Notable for the following components:   Glucose-Capillary 474 (*)    All other components within normal limits  CBG MONITORING, ED - Abnormal; Notable for the following components:   Glucose-Capillary 454 (*)    All other components within normal limits  SARS CORONAVIRUS 2 BY RT PCR (HOSPITAL ORDER, PERFORMED IN [facility])  PROTIME-INR  APTT  DIFFERENTIAL  CBG MONITORING, ED    EKG EKG Interpretation  Date/Time:  Saturday March 22 2020 12:34:46 EDT Ventricular Rate:  93 PR Interval:  124 QRS Duration: 82 QT Interval:  386 QTC Calculation: 479 R Axis:   -38 Text Interpretation: Normal sinus rhythm Left axis deviation Anterior infarct , age undetermined T wave abnormality, consider lateral ischemia Abnormal ECG T wave changes in V6 appear simiilar T wave changes in I and AVL different from july Confirmed by Marily Memos [phone]) on 03/23/2020 12:47:34 AM   Radiology CT HEAD WO CONTRAST  Result Date: 03/22/2020 CLINICAL DATA:  Right-sided weakness for 3 days. EXAM: CT HEAD WITHOUT CONTRAST TECHNIQUE: Contiguous axial images were obtained from the base of the skull through the vertex without intravenous contrast. COMPARISON:  None. FINDINGS: Brain: No subdural,  epidural, or subarachnoid hemorrhage. Focal low-attenuation in the right basal ganglia extending superiorly into the corona radiata, age indeterminate. Focal low-attenuation in the white matter of of the lower left frontal lobe on axial image 12. No other evidence of acute  ischemia or infarct. No mass effect or midline shift. Ventricles and sulci are unremarkable. Cerebellum, brainstem, and basal cisterns are normal. Vascular: No hyperdense vessel or unexpected calcification. Skull: Normal. Negative for fracture or focal lesion. Sinuses/Orbits: No acute finding. Other: None. IMPRESSION: 1. Focal white matter changes in the right corona radiata extending into the right basal ganglia and in the left frontal lobe, age indeterminate. No acute cortical ischemia identified. No other acute abnormalities. Electronically Signed   By: Gerome Sam III M.D   On: 03/22/2020 13:38   MR BRAIN WO CONTRAST  Result Date: 03/23/2020 CLINICAL DATA:  Right-sided numbness and pain EXAM: MRI HEAD WITHOUT CONTRAST MRI CERVICAL SPINE WITHOUT CONTRAST TECHNIQUE: Multiplanar, multiecho pulse sequences of the brain and surrounding structures, and cervical spine, to include the craniocervical junction and cervicothoracic junction, were obtained without intravenous contrast. COMPARISON:  None. FINDINGS: MRI HEAD FINDINGS Brain: There is a small focus of abnormal diffusion restriction within the left thalamus. Hyperintense T2-weighted signal within the periventricular white matter, right greater than left. There are old bilateral small vessel infarcts of the basal ganglia. Normal volume of CSF spaces. No chronic microhemorrhage. Normal midline structures. Vascular: Normal flow voids. Skull and upper cervical spine: Normal marrow signal. Sinuses/Orbits: Negative. Other: None. MRI CERVICAL SPINE FINDINGS Alignment: Physiologic. Vertebrae: No fracture, evidence of discitis, or bone lesion. Cord: Normal signal and morphology. Posterior Fossa, vertebral arteries, paraspinal tissues: Negative. Disc levels: C1-2: Unremarkable. C2-3: Normal. C3-4: Small left uncovertebral osteophyte.  No stenosis. C4-5: Normal. C5-6: Normal. C6-7: Normal. C7-T1: Normal. IMPRESSION: 1. Small focus of acute/subacute ischemia within  the left thalamus. No hemorrhage or mass effect. 2. 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 3. Normal cervical spinal cord. 4. Mild cervical degenerative disc disease without spinal canal or neural foraminal stenosis. Electronically Signed   By: Deatra Robinson M.D.   On: 03/23/2020 04:22   MR Cervical Spine Wo Contrast  Result Date: 03/23/2020 CLINICAL DATA:  Right-sided numbness and pain EXAM: MRI HEAD WITHOUT CONTRAST MRI CERVICAL SPINE WITHOUT CONTRAST TECHNIQUE: Multiplanar, multiecho pulse sequences of the brain and surrounding structures, and cervical spine, to include the craniocervical junction and cervicothoracic junction, were obtained without intravenous contrast. COMPARISON:  None. FINDINGS: MRI HEAD FINDINGS Brain: There is a small focus of abnormal diffusion restriction within the left thalamus. Hyperintense T2-weighted signal within the periventricular white matter, right greater than left. There are old bilateral small vessel infarcts of the basal ganglia. Normal volume of CSF spaces. No chronic microhemorrhage. Normal midline structures. Vascular: Normal flow voids. Skull and upper cervical spine: Normal marrow signal. Sinuses/Orbits: Negative. Other: None. MRI CERVICAL SPINE FINDINGS Alignment: Physiologic. Vertebrae: No fracture, evidence of discitis, or bone lesion. Cord: Normal signal and morphology. Posterior Fossa, vertebral arteries, paraspinal tissues: Negative. Disc levels: C1-2: Unremarkable. C2-3: Normal. C3-4: Small left uncovertebral osteophyte.  No stenosis. C4-5: Normal. C5-6: Normal. C6-7: Normal. C7-T1: Normal. IMPRESSION: 1. Small focus of acute/subacute ischemia within the left thalamus. No hemorrhage or mass effect. 2. 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 3. Normal cervical spinal cord. 4. Mild cervical degenerative disc disease without spinal canal or neural foraminal stenosis. Electronically Signed   By: Deatra Robinson M.D.   On: 03/23/2020 04:22     Procedures  Procedures (including critical care time)  Medications Ordered in ED Medications  sodium chloride flush (NS) 0.9 % injection 3 mL (has no administration in time range)  acetaminophen (TYLENOL) tablet 650 mg (650 mg Oral Not Given 03/23/20 0100)  acetaminophen (TYLENOL) tablet 650 mg (650 mg Oral Given 03/22/20 1451)  sodium chloride 0.9 % bolus 1,000 mL (1,000 mLs Intravenous Bolus from Bag 03/23/20 0525)  insulin aspart (novoLOG) injection 5 Units (5 Units Subcutaneous Given 03/23/20 0447)    ED Course  I have reviewed the triage vital signs and the nursing notes.  Pertinent labs & imaging results that were available during my care of the patient were reviewed by me and considered in my medical decision making (see chart for details).  Clinical Course as of Mar 24 539  Wynelle Link Mar 23, 2020  0117 Elevated from previous of 0.84  Creatinine(!): 1.16 [HM]  0118 baseline  Hemoglobin(!): 11.0 [HM]  0118  Focal white matter changes in the right corona radiata extending into the right basal ganglia and in the left frontal lobe, age indeterminate.  I personally evaluated the images.  No previous head CT for comparison.  CT HEAD WO CONTRAST [HM]    Clinical Course User Index [HM] Witney Huie, Boyd Kerbs   MDM Rules/Calculators/A&P                           Presents with 3 days of paresthesias and pain to the right upper and lower extremities.  Patient also with neck pain but denies trauma.  No fever, chills, neck stiffness, IV drug use, history of cancer.  Labs overall reassuring.  Glucose elevated however secondary to patient not taking insulin while here in the emergency department for the last 14 hours.  CT scan with focal white matter changes to the right corona radiata and left frontal lobe.  Clinical exam some more consistent with cervical radiculopathy.  Will obtain MRI of head and neck.  4:37 AM MRI confirms acute/subacute stroke in the left thalamus.  Will discuss with  neurology and admit to hospitalist.  5:38 AM Discussed with Dr. Derry Lory of neurology who has evaluated patient.   Discussed patient's case with hospitalist, Dr. Loney Loh.  I have recommended admission and patient (and family if present) agree with this plan. Admitting physician will place admission orders.   Final Clinical Impression(s) / ED Diagnoses Final diagnoses:  Thalamic stroke [facility])  Right sided weakness    Rx / DC Orders ED Discharge Orders    None       Kimarion Chery, Boyd Kerbs 03/23/20 0541    Mesner, Barbara Cower, MD 03/23/20 [phone]

## 2020-03-23 NOTE — ED Notes (Signed)
Attempted report x1. 

## 2020-03-23 NOTE — Evaluation (Signed)
Physical Therapy Evaluation Patient Details Name: Maria Harper MRN: [id number] DOB: [birth year] Today's Date: 03/23/2020   History of Present Illness  Maria Harper is a 49 y.o. female with medical history significant CAD status post PCI, chronic combined systolic and diastolic CHF (EF 45 to 50% on echo done April 2021), hypertension, hyperlipidemia, insulin-dependent type 2 diabetes, cocaine abuse, tobacco use, GERD presenting with complaints of right sided numbness and pain.  Patient states since Thursday 8/19 5 AM, the entire right side of her body is "numb and hurting".  MRI shows, Small focus of acute/subacute ischemia within the left   Clinical Impression  Pt admitted with/for L thalamic stroke.  Presently pt has sensory changes on her R side, she is mobile at a min guard level OOB Pt currently limited functionally due to the problems listed below.  (see problems list.)  Pt will benefit from PT to maximize function and safety to be able to get home safely with available assist.     Follow Up Recommendations Outpatient PT;Other (comment)    Equipment Recommendations  None recommended by PT    Recommendations for Other Services       Precautions / Restrictions Precautions Precautions: Fall      Mobility  Bed Mobility Overal bed mobility: Modified Independent                Transfers Overall transfer level: Needs assistance   Transfers: Sit to/from Stand Sit to Stand: Min guard         General transfer comment: mildly unsteady generally using hands appropriately for safety  Ambulation/Gait Ambulation/Gait assistance: Min guard Gait Distance (Feet): 35 Feet (then 100) Assistive device: None Gait Pattern/deviations: Step-through pattern Gait velocity: slower Gait velocity interpretation: <1.8 ft/sec, indicate of risk for recurrent falls General Gait Details: mildly unsteady and guarding, reaching for more stationary objects/wall.  Improved stability with  time up.  Stairs            Wheelchair Mobility    Modified Rankin (Stroke Patients Only) Modified Rankin (Stroke Patients Only) Pre-Morbid Rankin Score: No symptoms Modified Rankin: Moderate disability     Balance Overall balance assessment: Needs assistance Sitting-balance support: No upper extremity supported;Feet unsupported;Feet supported Sitting balance-Leahy Scale: Good     Standing balance support: No upper extremity supported;During functional activity Standing balance-Leahy Scale: Fair                               Pertinent Vitals/Pain Pain Assessment: Faces Faces Pain Scale: Hurts little more Pain Location: R side extremities/trunk Pain Descriptors / Indicators: Tingling;Other (Comment) (feeling like about to go to sleep) Pain Intervention(s): Monitored during session;Patient requesting pain meds-RN notified    Home Living Family/patient expects to be discharged to:: Private residence Living Arrangements: Spouse/significant other Available Help at Discharge: Family;Available 24 hours/day;Other (Comment) (husband) Type of Home: Apartment Home Access: Level entry     Home Layout: One level        Prior Function Level of Independence: Independent               Hand Dominance        Extremity/Trunk Assessment   Upper Extremity Assessment Upper Extremity Assessment: Generalized weakness (bil stiff and weak, R tingling and mildly unccor.  functiona)    Lower Extremity Assessment Lower Extremity Assessment: Generalized weakness (R notably weaker than L LE)       Communication   Communication: No difficulties  Cognition  Arousal/Alertness: Awake/alert Behavior During Therapy: WFL for tasks assessed/performed;Flat affect Overall Cognitive Status: Within Functional Limits for tasks assessed (NT formally)                                        General Comments      Exercises     Assessment/Plan    PT  Assessment Patient needs continued PT services  PT Problem List Decreased strength;Decreased activity tolerance;Decreased balance;Decreased mobility;Decreased coordination;Pain       PT Treatment Interventions Gait training;Stair training;Functional mobility training;Therapeutic activities;Balance training;Neuromuscular re-education;Patient/family education    PT Goals (Current goals can be found in the Care Plan section)  Acute Rehab PT Goals Patient Stated Goal: home PT Goal Formulation: With patient Time For Goal Achievement: 03/30/20 Potential to Achieve Goals: Good    Frequency Min 3X/week   Barriers to discharge        Co-evaluation               AM-PAC PT "6 Clicks" Mobility  Outcome Measure Help needed turning from your back to your side while in a flat bed without using bedrails?: None Help needed moving from lying on your back to sitting on the side of a flat bed without using bedrails?: None Help needed moving to and from a bed to a chair (including a wheelchair)?: A Little Help needed standing up from a chair using your arms (e.g., wheelchair or bedside chair)?: A Little Help needed to walk in hospital room?: A Little Help needed climbing 3-5 steps with a railing? : A Little 6 Click Score: 20    End of Session   Activity Tolerance: Patient tolerated treatment well Patient left: in bed;with call bell/phone within reach Nurse Communication: Mobility status PT Visit Diagnosis: Unsteadiness on feet (R26.81);Muscle weakness (generalized) (M62.81);Other symptoms and signs involving the nervous system (R29.898)    Time: 0160-1093 PT Time Calculation (min) (ACUTE ONLY): 23 min   Charges:   PT Evaluation $PT Eval Low Complexity: 1 Low PT Treatments $Gait Training: 8-22 mins        03/23/2020  Jacinto Halim., PT Acute Rehabilitation Services [phone]  (pager) [phone]  (office)  Maria Harper 03/23/2020, 11:06 AM

## 2020-03-23 NOTE — ED Notes (Signed)
Pt. Refused COVID test.

## 2020-03-23 NOTE — ED Notes (Signed)
CBG repeated to ensure accuracy.

## 2020-03-23 NOTE — ED Notes (Signed)
Pt. Transported to MRI 

## 2020-03-23 NOTE — ED Notes (Signed)
Pt transported to MRI 

## 2020-03-23 NOTE — ED Notes (Signed)
Call husband Lenna Gilford at [phone] with an update

## 2020-03-23 NOTE — H&P (Signed)
History and Physical    Ardythe Klute ZDG:[id number] DOB: 1971-03-11 DOA: 03/22/2020  PCP: Lavinia Sharps, NP Patient coming from: Home  Chief Complaint: Right-sided numbness and pain  HPI: Maria Harper is a 48 y.o. female with medical history significant CAD status post PCI, chronic combined systolic and diastolic CHF (EF 45 to 50% on echo done April 2021), hypertension, hyperlipidemia, insulin-dependent type 2 diabetes, cocaine abuse, tobacco use, GERD presenting with complaints of right sided numbness and pain.  Patient states since Thursday 8/19 5 AM, the entire right side of her body is "numb and hurting."  Denies any focal weakness.  Denies prior history of stroke.  She smokes 1/2 pack of cigarettes daily.  States she missed her dose of insulin yesterday prior to coming into the ED but believes her blood glucose has been well controlled otherwise.  Denies cough, shortness of breath, or chest pain.  States she has not been vaccinated against Covid.  States she was previously using crack cocaine but is no longer doing so.  ED Course: Afebrile.  Labs showing no leukocytosis.  Hemoglobin 11.0, no significant change from baseline.  Bicarb 17, anion gap 16.  Blood glucose 354.  SARS-CoV-2 PCR test pending.  Head CT showing focal white matter changes in the right corona radiata extending into the right basal ganglia and in the left frontal lobe, age-indeterminate.  No acute cortical ischemia identified.  No other acute abnormalities.  Brain MRI showing small focus of acute/subacute ischemia within the left thalamus.  No hemorrhage or mass-effect.  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  MRI C-spine negative for acute abnormality.  Showing mild cervical degenerative disc disease without spinal canal or neural foraminal stenosis.  Neurology consulted.  Patient was given NovoLog 5 units, 1 L normal saline bolus, and Tylenol.  Review of Systems:  All systems reviewed and apart  from history of presenting illness, are negative.  Past Medical History:  Diagnosis Date  . Acid reflux   . Cocaine abuse (HCC)   . Diabetes mellitus without complication (HCC)   . Heart attack (HCC) 04/2016  . High cholesterol   . Hypertension     Past Surgical History:  Procedure Laterality Date  . CORONARY/GRAFT ACUTE MI REVASCULARIZATION N/A 11/23/2019   Procedure: CORONARY/GRAFT ACUTE MI REVASCULARIZATION;  Surgeon: [name], MD;  Location: [facility] INVASIVE CV LAB;  Service: Cardiovascular;  Laterality: N/A;  . LEFT HEART CATH AND CORONARY ANGIOGRAPHY N/A 11/23/2019   Procedure: LEFT HEART CATH AND CORONARY ANGIOGRAPHY;  Surgeon: [name], MD;  Location: [facility] INVASIVE CV LAB;  Service: Cardiovascular;  Laterality: N/A;     reports that she has been smoking cigarettes. She has a 30.00 pack-year smoking history. She has never used smokeless tobacco. She reports previous alcohol use. She reports current drug use. Drug: "Crack" cocaine.  Allergies  Allergen Reactions  . Bee Venom Anaphylaxis  . Contrast Media [Iodinated Diagnostic Agents] Hives and Itching  . Tomato Itching and Rash    Family History  Problem Relation Age of Onset  . Diabetes Mother   . Diabetes Father   . Diabetes Maternal Grandmother   . Diabetes Maternal Grandfather     Prior to Admission medications   Medication Sig Start Date End Date Taking? Authorizing Provider  albuterol (VENTOLIN HFA) 108 (90 Base) MCG/ACT inhaler Inhale 2 puffs into the lungs every 6 (six) hours as needed for wheezing or shortness of breath.   Yes [provider]  aspirin 81 MG  chewable tablet Chew 81 mg by mouth daily.   Yes [provider]  atorvastatin (LIPITOR) 80 MG tablet Take 80 mg by mouth daily at 6 PM.  04/05/18  Yes [provider]  carvedilol (COREG) 12.5 MG tablet Take 1.5 tablets (18.75 mg total) by mouth 2 (two) times daily with a meal. 11/28/19  Yes Marjie Skiff E, PA-C   cloNIDine (CATAPRES) 0.1 MG tablet Take 0.1 mg by mouth 2 (two) times daily. 12/20/17  Yes [provider]  cyclobenzaprine (FLEXERIL) 10 MG tablet Take 10 mg by mouth 3 (three) times daily as needed for muscle spasms.    Yes [provider]  gabapentin (NEURONTIN) 300 MG capsule Take 300 mg by mouth 3 (three) times daily.   Yes [provider]  glipiZIDE (GLUCOTROL) 5 MG tablet Take 5 mg by mouth 2 (two) times daily before a meal.  12/20/17  Yes [provider]  HUMULIN R 100 UNIT/ML injection Inject 17 Units into the skin 2 (two) times daily before a meal.  06/13/18  Yes [provider]  insulin NPH-regular Human (HUMULIN 70/30) (70-30) 100 UNIT/ML injection Inject 35 Units into the skin 2 (two) times daily.  07/01/17  Yes [provider]  isosorbide mononitrate (IMDUR) 30 MG 24 hr tablet Take 1 tablet (30 mg total) by mouth daily. 11/29/19  Yes Marjie Skiff E, PA-C  losartan (COZAAR) 25 MG tablet Take 1 tablet (25 mg total) by mouth daily. 11/29/19  Yes Irene Limbo, Callie E, PA-C  montelukast (SINGULAIR) 10 MG tablet Take 10 mg by mouth at bedtime.   Yes [provider]  nitroGLYCERIN (NITROSTAT) 0.4 MG SL tablet Place 1 tablet (0.4 mg total) under the tongue as needed for chest pain. 11/28/19  Yes Marjie Skiff E, PA-C  pantoprazole (PROTONIX) 40 MG tablet Take 40 mg by mouth 2 (two) times daily.    Yes [provider]  polyethylene glycol powder (GLYCOLAX/MIRALAX) powder Take 17 g by mouth daily as needed for moderate constipation.  04/05/18  Yes [provider]  QUEtiapine (SEROQUEL) 200 MG tablet Take 200 mg by mouth daily.  04/05/18  Yes [provider]  QUEtiapine (SEROQUEL) 400 MG tablet Take 400 mg by mouth at bedtime. 04/05/18  Yes [provider]  ticagrelor (BRILINTA) 90 MG TABS tablet Take 1 tablet (90 mg total) by mouth 2 (two) times daily. 11/28/19  Yes Marjie Skiff E, PA-C  cephALEXin  (KEFLEX) 500 MG capsule Take 1 capsule (500 mg total) by mouth every 6 (six) hours. Patient not taking: Reported on 03/23/2020 11/28/19   Corrin Parker, PA-C  cephALEXin (KEFLEX) 500 MG capsule Take 1 capsule (500 mg total) by mouth 4 (four) times daily. Patient not taking: Reported on 03/23/2020 02/06/20   Vanetta Mulders, MD  HYDROcodone-acetaminophen (NORCO/VICODIN) 5-325 MG tablet Take 1 tablet by mouth every 6 (six) hours as needed for moderate pain. Patient not taking: Reported on 03/23/2020 02/06/20   Vanetta Mulders, MD  nicotine (NICODERM CQ - DOSED IN MG/24 HOURS) 14 mg/24hr patch Place 1 patch (14 mg total) onto the skin daily. Patient not taking: Reported on 03/23/2020 11/29/19   Corrin Parker, PA-C    Physical Exam: Vitals:   03/22/20 1457 03/22/20 1805 03/22/20 2102 03/23/20 0012  BP: 134/80 139/84 135/84 (!) 147/82  Pulse: 92 (!) 107 99 99  Resp: 16 16 18 16   Temp:   98.3 F (36.8 C) 97.8 F (36.6 C)  TempSrc:   Oral Oral  SpO2: 98% 100% 100% 97%  Weight:      Height:        Physical Exam Constitutional:      General: She is not in acute distress. HENT:     Head: Normocephalic and atraumatic.  Eyes:     Extraocular Movements: Extraocular movements intact.     Conjunctiva/sclera: Conjunctivae normal.  Cardiovascular:     Rate and Rhythm: Normal rate and regular rhythm.     Pulses: Normal pulses.  Pulmonary:     Effort: Pulmonary effort is normal. No respiratory distress.     Breath sounds: Normal breath sounds. No wheezing or rales.  Abdominal:     General: Bowel sounds are normal. There is no distension.     Palpations: Abdomen is soft.     Tenderness: There is no abdominal tenderness. There is no guarding.  Musculoskeletal:        General: No swelling or tenderness.     Cervical back: Normal range of motion and neck supple.  Skin:    General: Skin is warm and dry.  Neurological:     Mental Status: She is alert and oriented to person, place, and time.      Comments: Speech fluent, tongue midline, no facial droop Sensation to light touch diminished on the right lower face, right upper and lower extremity. No focal motor weakness.     Labs on Admission: I have personally reviewed following labs and imaging studies  CBC: Recent Labs  Lab 03/22/20 1244 03/22/20 1315  WBC 6.2  --   NEUTROABS 2.9  --   HGB 11.0* 11.6*  HCT 35.0* 34.0*  MCV 100.3*  --   PLT 261  --    Basic Metabolic Panel: Recent Labs  Lab 03/22/20 1244 03/22/20 1315  NA 135 136  K 4.2 4.2  CL 102 106  CO2 17*  --   GLUCOSE 354* 359*  BUN 11 13  CREATININE 1.16* 0.80  CALCIUM 9.2  --    GFR: Estimated Creatinine Clearance: 87.1 mL/min (by C-G formula based on SCr of 0.8 mg/dL). Liver Function Tests: Recent Labs  Lab 03/22/20 1244  AST 14*  ALT 15  ALKPHOS 69  BILITOT 0.3  PROT 7.4  ALBUMIN 3.8   No results for input(s): LIPASE, AMYLASE in the last 168 hours. No results for input(s): AMMONIA in the last 168 hours. Coagulation Profile: Recent Labs  Lab 03/22/20 1244  INR 1.0   Cardiac Enzymes: No results for input(s): CKTOTAL, CKMB, CKMBINDEX, TROPONINI in the last 168 hours. BNP (last 3 results) No results for input(s): PROBNP in the last 8760 hours. HbA1C: No results for input(s): HGBA1C in the last 72 hours. CBG: Recent Labs  Lab 03/23/20 0119 03/23/20 0120 03/23/20 0637  GLUCAP 474* 454* 307*   Lipid Profile: No results for input(s): CHOL, HDL, LDLCALC, TRIG, CHOLHDL, LDLDIRECT in the last 72 hours. Thyroid Function Tests: No results for input(s): TSH, T4TOTAL, FREET4, T3FREE, THYROIDAB in the last 72 hours. Anemia Panel: No results for input(s): VITAMINB12, FOLATE, FERRITIN, TIBC, IRON, RETICCTPCT in the last 72 hours. Urine analysis:    Component Value Date/Time   COLORURINE YELLOW 02/06/2020 1455   APPEARANCEUR HAZY (A) 02/06/2020 1455   LABSPEC 1.020 02/06/2020 1455   PHURINE 6.0 02/06/2020 1455   GLUCOSEU NEGATIVE  02/06/2020 1455   HGBUR SMALL (A) 02/06/2020 1455   BILIRUBINUR NEGATIVE 02/06/2020 1455   KETONESUR NEGATIVE 02/06/2020 1455   PROTEINUR 30 (A) 02/06/2020 1455   NITRITE  NEGATIVE 02/06/2020 1455   LEUKOCYTESUR SMALL (A) 02/06/2020 1455    Radiological Exams on Admission: CT HEAD WO CONTRAST  Result Date: 03/22/2020 CLINICAL DATA:  Right-sided weakness for 3 days. EXAM: CT HEAD WITHOUT CONTRAST TECHNIQUE: Contiguous axial images were obtained from the base of the skull through the vertex without intravenous contrast. COMPARISON:  None. FINDINGS: Brain: No subdural, epidural, or subarachnoid hemorrhage. Focal low-attenuation in the right basal ganglia extending superiorly into the corona radiata, age indeterminate. Focal low-attenuation in the white matter of of the lower left frontal lobe on axial image 12. No other evidence of acute ischemia or infarct. No mass effect or midline shift. Ventricles and sulci are unremarkable. Cerebellum, brainstem, and basal cisterns are normal. Vascular: No hyperdense vessel or unexpected calcification. Skull: Normal. Negative for fracture or focal lesion. Sinuses/Orbits: No acute finding. Other: None. IMPRESSION: 1. Focal white matter changes in the right corona radiata extending into the right basal ganglia and in the left frontal lobe, age indeterminate. No acute cortical ischemia identified. No other acute abnormalities. Electronically Signed   By: Gerome Sam III M.D   On: 03/22/2020 13:38   MR BRAIN WO CONTRAST  Result Date: 03/23/2020 CLINICAL DATA:  Right-sided numbness and pain EXAM: MRI HEAD WITHOUT CONTRAST MRI CERVICAL SPINE WITHOUT CONTRAST TECHNIQUE: Multiplanar, multiecho pulse sequences of the brain and surrounding structures, and cervical spine, to include the craniocervical junction and cervicothoracic junction, were obtained without intravenous contrast. COMPARISON:  None. FINDINGS: MRI HEAD FINDINGS Brain: There is a small focus of abnormal  diffusion restriction within the left thalamus. Hyperintense T2-weighted signal within the periventricular white matter, right greater than left. There are old bilateral small vessel infarcts of the basal ganglia. Normal volume of CSF spaces. No chronic microhemorrhage. Normal midline structures. Vascular: Normal flow voids. Skull and upper cervical spine: Normal marrow signal. Sinuses/Orbits: Negative. Other: None. MRI CERVICAL SPINE FINDINGS Alignment: Physiologic. Vertebrae: No fracture, evidence of discitis, or bone lesion. Cord: Normal signal and morphology. Posterior Fossa, vertebral arteries, paraspinal tissues: Negative. Disc levels: C1-2: Unremarkable. C2-3: Normal. C3-4: Small left uncovertebral osteophyte.  No stenosis. C4-5: Normal. C5-6: Normal. C6-7: Normal. C7-T1: Normal. IMPRESSION: 1. Small focus of acute/subacute ischemia within the left thalamus. No hemorrhage or mass effect. 2. 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 3. Normal cervical spinal cord. 4. Mild cervical degenerative disc disease without spinal canal or neural foraminal stenosis. Electronically Signed   By: Deatra Robinson M.D.   On: 03/23/2020 04:22   MR Cervical Spine Wo Contrast  Result Date: 03/23/2020 CLINICAL DATA:  Right-sided numbness and pain EXAM: MRI HEAD WITHOUT CONTRAST MRI CERVICAL SPINE WITHOUT CONTRAST TECHNIQUE: Multiplanar, multiecho pulse sequences of the brain and surrounding structures, and cervical spine, to include the craniocervical junction and cervicothoracic junction, were obtained without intravenous contrast. COMPARISON:  None. FINDINGS: MRI HEAD FINDINGS Brain: There is a small focus of abnormal diffusion restriction within the left thalamus. Hyperintense T2-weighted signal within the periventricular white matter, right greater than left. There are old bilateral small vessel infarcts of the basal ganglia. Normal volume of CSF spaces. No chronic microhemorrhage. Normal midline structures.  Vascular: Normal flow voids. Skull and upper cervical spine: Normal marrow signal. Sinuses/Orbits: Negative. Other: None. MRI CERVICAL SPINE FINDINGS Alignment: Physiologic. Vertebrae: No fracture, evidence of discitis, or bone lesion. Cord: Normal signal and morphology. Posterior Fossa, vertebral arteries, paraspinal tissues: Negative. Disc levels: C1-2: Unremarkable. C2-3: Normal. C3-4: Small left uncovertebral osteophyte.  No stenosis. C4-5: Normal. C5-6: Normal. C6-7: Normal.  C7-T1: Normal. IMPRESSION: 1. Small focus of acute/subacute ischemia within the left thalamus. No hemorrhage or mass effect. 2. 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 3. Normal cervical spinal cord. 4. Mild cervical degenerative disc disease without spinal canal or neural foraminal stenosis. Electronically Signed   By: Deatra Robinson M.D.   On: 03/23/2020 04:22    EKG: Independently reviewed.  Sinus rhythm, nonspecific T wave abnormalities.  No significant change since prior tracing.  Assessment/Plan  Principal Problem:   Acute CVA (cerebrovascular accident) (HCC) Active Problems:   CAD S/P percutaneous coronary angioplasty   Tobacco abuse   Hypertension   Hyperglycemia due to type 2 diabetes mellitus (HCC)   Acute CVA: Presenting with a 3-day history of right-sided numbness which is persistent. Brain MRI showing small focus of acute/subacute ischemia within the left thalamus.  No hemorrhage or mass-effect.  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 -Telemetry monitoring -MRA head -Carotid Doppler -2D echocardiogram -Hemoglobin A1c, fasting lipid panel -Continue home aspirin 81 mg daily as per neurology recommendation -Continue home Lipitor 80 mg daily -Frequent neurochecks -PT, OT, speech therapy. -N.p.o. until cleared by bedside swallow evaluation or formal speech evaluation -Neurology following, appreciate recommendations  Hyperglycemia, poorly controlled insulin-dependent type 2 diabetes:  A1c 9.6 on labs done 4 months ago.  Blood glucose 354 on metabolic panel.  Bicarb 17, anion gap 16.  Patient received NovoLog 5 units and 1 L normal saline bolus in the ED.  Most recent CBG 307. -Continue home insulin - NovoLog 70/30 35 units twice daily.  Order sliding scale insulin moderate ACHS.  Stat repeat metabolic panel to check bicarb and anion gap.  Check beta hydroxybutyric acid level.  If elevated, stop subcutaneous insulin and instead start insulin infusion for possible early DKA.  CAD status post PCI: Not endorsing any anginal symptoms at present. -Continue home aspirin, Brilinta, Coreg, Imdur, Lipitor  Chronic combined systolic and diastolic CHF: No signs of volume overload at this time. -Continue home Coreg.  Monitor intake and output, daily weights.  Hypertension: Out of permissive hypertension window since symptom onset was 3 days ago. -Continue home Coreg, clonidine, Imdur  Hyperlipidemia -Continue Lipitor  History of cocaine abuse -UDS pending  Tobacco use: Patient reports smoking 1/2 pack of cigarettes daily. -NicoDerm patch and counseling  DVT prophylaxis: Lovenox Code Status: Full code Family Communication: No family available at this time. Disposition Plan: Status is: Inpatient  Remains inpatient appropriate because:Ongoing diagnostic testing needed not appropriate for outpatient work up and Inpatient level of care appropriate due to severity of illness   Dispo: The patient is from: Home              Anticipated d/c is to: Home              Anticipated d/c date is: 2 days              Patient currently is not medically stable to d/c.  The medical decision making on this patient was of high complexity and the patient is at high risk for clinical deterioration, therefore this is a level 3 visit.  John Giovanni MD [facility]  If 7PM-7AM, please contact night-coverage [email or web address]  03/23/2020, 7:17 AM

## 2020-03-23 NOTE — Progress Notes (Signed)
Brief note: -Patient was admitted earlier today. -Patient is a 50 year old African-American female, obese, with past medical history significant for CAD status post PCI, chronic combined systolic and diastolic CHF (EF 45 to 50% on echo done April 2021), hypertension, hyperlipidemia, insulin-dependent type 2 diabetes, cocaine abuse, tobacco use, and GERD.  Patient presented with right-sided numbness and pain.  MRI brain revealed small focus of acute/subacute ischemia within the left thalamus.  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  Patient is currently being worked up for acute CVA.  Neurology team is directing care.  Acute CVA: -Presented with a 3-day history of right-sided numbness which is persistent. Brain MRI showing small focus of acute/subacute ischemia within the left thalamus.  No hemorrhage or mass-effect.  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 -Telemetry monitoring -MRA head -Carotid Doppler -2D echocardiogram -Hemoglobin A1c, fasting lipid panel -Continue home aspirin 81 mg daily as per neurology recommendation -Continue home Lipitor 80 mg daily -Frequent neurochecks -PT, OT, speech therapy. -N.p.o. until cleared by bedside swallow evaluation or formal speech evaluation -Neurology following, appreciate recommendations 03/23/2020: Continue work-up as documented above.  Hyperglycemia, poorly controlled insulin-dependent type 2 diabetes:  -A1c 9.6 on labs done 4 months ago.   -Blood glucose 354 on metabolic panel.  -Hemoglobin A1c today was 9.1%.  -Continue home insulin - NovoLog 70/30 35 units twice daily.  Order sliding scale insulin moderate ACHS.  Stat repeat metabolic panel to check bicarb and anion gap.    CAD status post PCI:  -Stable.   -Continue home aspirin, Brilinta, Coreg, Imdur, Lipitor  Chronic combined systolic and diastolic CHF:  -Compensated.   -Continue home Coreg.  Monitor intake and output, daily  weights.  Hypertension:  -Out of permissive hypertension window since symptom onset was 3 days ago. -Continue home Coreg, clonidine, Imdur  Hyperlipidemia -Continue Lipitor  History of cocaine abuse -UDS pending  Tobacco use: Patient reports smoking 1/2 pack of cigarettes daily. -NicoDerm patch and counseling

## 2020-03-23 NOTE — ED Notes (Signed)
ECHO at bedside.

## 2020-03-24 ENCOUNTER — Inpatient Hospital Stay (HOSPITAL_COMMUNITY): Payer: Self-pay

## 2020-03-24 DIAGNOSIS — I10 Essential (primary) hypertension: Secondary | ICD-10-CM

## 2020-03-24 DIAGNOSIS — E7800 Pure hypercholesterolemia, unspecified: Secondary | ICD-10-CM

## 2020-03-24 DIAGNOSIS — I2510 Atherosclerotic heart disease of native coronary artery without angina pectoris: Secondary | ICD-10-CM

## 2020-03-24 DIAGNOSIS — E1165 Type 2 diabetes mellitus with hyperglycemia: Secondary | ICD-10-CM

## 2020-03-24 DIAGNOSIS — I639 Cerebral infarction, unspecified: Secondary | ICD-10-CM

## 2020-03-24 DIAGNOSIS — Z794 Long term (current) use of insulin: Secondary | ICD-10-CM

## 2020-03-24 DIAGNOSIS — Z720 Tobacco use: Secondary | ICD-10-CM

## 2020-03-24 DIAGNOSIS — Z9861 Coronary angioplasty status: Secondary | ICD-10-CM

## 2020-03-24 LAB — GLUCOSE, CAPILLARY
Glucose-Capillary: 283 mg/dL — ABNORMAL HIGH (ref 70–99)
Glucose-Capillary: 314 mg/dL — ABNORMAL HIGH (ref 70–99)

## 2020-03-24 NOTE — Evaluation (Signed)
Speech Language Pathology Evaluation Patient Details Name: Maria Harper MRN: [id number] DOB: [birth year] Today's Date: 03/24/2020 Time: 4259-5638 SLP Time Calculation (min) (ACUTE ONLY): 25 min  Problem List:  Patient Active Problem List   Diagnosis Date Noted  . Acute CVA (cerebrovascular accident) (HCC) 03/23/2020  . Hyperglycemia due to type 2 diabetes mellitus (HCC) 03/23/2020  . Hypertension 11/28/2019  . UTI (urinary tract infection) 11/28/2019  . Hyperlipidemia with target LDL less than 70   . Tobacco abuse   . Type II diabetes mellitus with neurological manifestations, uncontrolled (HCC) 11/25/2019  . Acute ST elevation myocardial infarction (STEMI) of anterior wall (HCC) 11/23/2019  . CAD S/P percutaneous coronary angioplasty 11/23/2019  . Acute ST elevation myocardial infarction (STEMI) of anteroseptal wall (HCC) 11/23/2019  . Sepsis (HCC) 09/19/2019  . Cocaine abuse with cocaine-induced mood disorder (HCC) 06/10/2017   Past Medical History:  Past Medical History:  Diagnosis Date  . Acid reflux   . Cocaine abuse (HCC)   . Diabetes mellitus without complication (HCC)   . Heart attack (HCC) 04/2016  . High cholesterol   . Hypertension    Past Surgical History:  Past Surgical History:  Procedure Laterality Date  . CORONARY/GRAFT ACUTE MI REVASCULARIZATION N/A 11/23/2019   Procedure: CORONARY/GRAFT ACUTE MI REVASCULARIZATION;  Surgeon: [name], MD;  [facility] INVASIVE CV LAB;  Service: Cardiovascular;  Laterality: N/A;  . LEFT HEART CATH AND CORONARY ANGIOGRAPHY N/A 11/23/2019   Procedure: LEFT HEART CATH AND CORONARY ANGIOGRAPHY;  Surgeon: [name], MD;  Location: [facility] INVASIVE CV LAB;  Service: Cardiovascular;  Laterality: N/A;   HPI:  49yo female admitted 03/22/20 with right side numbness.  PMH: CAD s/p PCI, chronic combined systolic and diastolic CHF, HTN, HLD, IDDM2, cocaine abuse, tobacco use, and GERD.  MRI = (sub)acute ischemia in left  thalamus, old bilateral basal ganglia infarcts. Passed [facility]   Assessment / Plan / Recommendation Clinical Impression  Pt seen for cognitive linguistic evaluation following left thalamic infarct. She reports having a 6th grade education, and difficulty with memory and attention at baseline. This was noted on SLUMS Examination ([employer] Mental Status). Pt scored 22/3, with points lost on delayed recall (3/5 recalled), and auditory attention and recall. Pt reports she has had difficulty in these areas for years. She was encouraged to write information in a calendar or make a list to facilitate recall.    SLP Assessment  SLP Recommendation/Assessment: All further Speech Language Pathology needs can be addressed in the next venue of care (if needs arise)  SLP Visit Diagnosis: Cognitive communication deficit (R41.841)    Follow Up Recommendations  None . Pt was encouraged to notify PCP if difficulty noted upon return to normal routines.       SLP Evaluation Cognition  Overall Cognitive Status: Within Functional Limits for tasks assessed Arousal/Alertness: Awake/alert Orientation Level: Oriented X4 Attention: Focused;Sustained Focused Attention: Appears intact Sustained Attention: Impaired (baseline per pt)       Comprehension  Auditory Comprehension Overall Auditory Comprehension: Appears within functional limits for tasks assessed    Expression Expression Primary Mode of Expression: Verbal Verbal Expression Overall Verbal Expression: Appears within functional limits for tasks assessed Written Expression Dominant Hand: Right   Oral / Motor  Oral Motor/Sensory Function Overall Oral Motor/Sensory Function: Within functional limits Motor Speech Overall Motor Speech: Appears within functional limits for tasks assessed   GO  Lexine Jaspers B. Murvin Natal, [facility], CCC-SLP Speech Language Pathologist Office: [phone] Pager: [phone]  Leigh Aurora 03/24/2020, 11:26 AM

## 2020-03-24 NOTE — Discharge Instructions (Signed)
Maria Harper,  You were in the hospital with a stroke. Your medications will remain unchanged except glipizide will be discontinued. Please follow-up the cardiologist.

## 2020-03-24 NOTE — TOC Initial Note (Signed)
Transition of Care [facility]) - Initial/Assessment Note    Patient Details  Name: Maria Harper MRN: [id number] Date of Birth: 03/22/1971  [facility] CM/SW Contact:    Joanne Chars, LCSW Phone Number: 03/24/2020, 1:58 PM  Clinical Narrative:    CSW met with pt to discuss discharge plan and several other issues.  MD requested substance use consult.  Pt reports she is currently seeing counselor at [facility] and this has helped her be drug free for the past year. CSW provided resource list anyways.  Pt uninsured and willing to go to appt at [place] for new PCP.  This was scheduled.  CSW discussed recommendation for outpatient PT/OT and pt also willing to attend these sessions.  CSW spoke to rehab center on [facility] and was referred to pt accounting for [employer] financial assistance.  CSW spoke with pt accounting, who interviewed pt over the phone and initiated the application for financial assistance, which will be mailed to pt.  CSW also spoke with pt husband about this process and he will assist.  [place] center recommended calling neuro rehab on [address], this was done and MD send referral/order to initiate PT/OT services there.  No equipment recommended and pt stated she did not need equipment.  No other needs identified.              Expected Discharge Plan: Home/Self Care Barriers to Discharge: Inadequate or no insurance   Patient Goals and CMS Choice Patient states their goals for this hospitalization and ongoing recovery are:: to be here for my grandchildren      Expected Discharge Plan and Services Expected Discharge Plan: Home/Self Care     Post Acute Care Choice:  (outpatient rehabilitation) Living arrangements for the past 2 months: Apartment Expected Discharge Date: 03/24/20               DME Arranged: N/A (none recommended)                    Prior Living Arrangements/Services Living arrangements for the  past 2 months: Apartment Lives with:: Spouse Patient language and need for interpreter reviewed:: Yes Do you feel safe going back to the place where you live?: Yes      Need for Family Participation in Patient Care: No (Comment) Care giver support system in place?: Yes (comment)   Criminal Activity/Legal Involvement Pertinent to Current Situation/Hospitalization: No - Comment as needed  Activities of Daily Living      Permission Sought/Granted Permission sought to share information with : Facility Sport and exercise psychologist, Family Supports    Share Information with NAME: husband, Will  Permission granted to share info w AGENCY: outpatient rehab        Emotional Assessment Appearance:: Appears stated age Attitude/Demeanor/Rapport: Engaged Affect (typically observed): Pleasant Orientation: : Oriented to Self, Oriented to Place, Oriented to  Time, Oriented to Situation Alcohol / Substance Use: Not Applicable Psych Involvement: No (comment)  Admission diagnosis:  Right sided weakness [R53.1] Acute CVA (cerebrovascular accident) ([place]) [I63.9] Thalamic stroke [facility]) [I63.9] Patient Active Problem List   Diagnosis Date Noted  . Acute CVA (cerebrovascular accident) ([place]) 03/23/2020  . Hyperglycemia due to type 2 diabetes mellitus ([place]) 03/23/2020  . Hypertension 11/28/2019  . UTI (urinary tract infection) 11/28/2019  . Hyperlipidemia with target LDL less than 70   . Tobacco abuse   . Type II diabetes mellitus with neurological manifestations, uncontrolled ([place]) 11/25/2019  .  Acute ST elevation myocardial infarction (STEMI) of anterior wall ([place]) 11/23/2019  . CAD S/P percutaneous coronary angioplasty 11/23/2019  . Acute ST elevation myocardial infarction (STEMI) of anteroseptal wall ([place]) 11/23/2019  . Sepsis ([place]) 09/19/2019  . Cocaine abuse with cocaine-induced mood disorder ([place]) 06/10/2017   PCP:  Marliss Coots, NP Pharmacy:   [facility], [place] -  [address] [place] [address] Phone: [phone] Fax: [phone]  [facility] [place], [place] - [address] [place] [address] Phone: [phone] Fax: [phone]  [facility] [address] [place] - [place] [address] Phone: [phone] Fax: [phone]     Social Determinants of Health (SDOH) Interventions    Readmission Risk Interventions Readmission Risk Prevention Plan 11/26/2019  Transportation Screening Complete  PCP or Specialist Appt within 3-5 Days Complete  HRI or Home Care Consult Complete  Social Work Consult for [place] Planning/Counseling Complete  Palliative Care Screening Not Applicable  Medication Review Press photographer) Complete  Some recent data might be hidden

## 2020-03-24 NOTE — Progress Notes (Signed)
Carotid duplex bilateral study completed.   Please see CV Proc for preliminary results.   Lyra Alaimo  

## 2020-03-24 NOTE — Discharge Summary (Signed)
Physician Discharge Summary  Wendee Gatchell VHQ:[id number] DOB: 11-08-1970 DOA: 03/22/2020  PCP: Lavinia Sharps, NP  Admit date: 03/22/2020 Discharge date: 03/25/2020  Admitted From: Home Disposition: Home  Recommendations for Outpatient Follow-up:  1. Follow up with PCP in 1 week 2. Please obtain BMP/CBC in one week 3. Please follow up on the following pending results: None  Home Health: Outpatient PT/OT Equipment/Devices: None  Discharge Condition: Stable CODE STATUS: Full code Diet recommendation: Heart healthy   Brief/Interim Summary:  Admission HPI written by John Giovanni, MD   Chief Complaint: Right-sided numbness and pain  HPI: Karina Brasseur is a 49 y.o. female with medical history significant CAD status post PCI, chronic combined systolic and diastolic CHF (EF 45 to 50% on echo done April 2021), hypertension, hyperlipidemia, insulin-dependent type 2 diabetes, cocaine abuse, tobacco use, GERD presenting with complaints of right sided numbness and pain.  Patient states since Thursday 8/19 5 AM, the entire right side of her body is "numb and hurting."  Denies any focal weakness.  Denies prior history of stroke.  She smokes 1/2 pack of cigarettes daily.  States she missed her dose of insulin yesterday prior to coming into the ED but believes her blood glucose has been well controlled otherwise.  Denies cough, shortness of breath, or chest pain.  States she has not been vaccinated against Covid.  States she was previously using crack cocaine but is no longer doing so.  ED Course: Afebrile.  Labs showing no leukocytosis.  Hemoglobin 11.0, no significant change from baseline.  Bicarb 17, anion gap 16.  Blood glucose 354.  SARS-CoV-2 PCR test pending.  Head CT showing focal white matter changes in the right corona radiata extending into the right basal ganglia and in the left frontal lobe, age-indeterminate.  No acute cortical ischemia identified.  No other acute  abnormalities.  Brain MRI showing small focus of acute/subacute ischemia within the left thalamus.  No hemorrhage or mass-effect.  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  MRI C-spine negative for acute abnormality.  Showing mild cervical degenerative disc disease without spinal canal or neural foraminal stenosis.  Neurology consulted.  Patient was given NovoLog 5 units, 1 L normal saline bolus, and Tylenol.   Hospital course:  Acute CVA Left sided thalamic stroke seen on MRI brain. MRA 70% right supraclinoid ICA stenosis/highgrade narrowing at left petrous cavernous junction. Carotid dopplers significant for No . Hemoglobin A1C of 9.1% and LDL of 90. PT/OT recommending outpatient PT/OT. Speech therapy recommending no follow-up. Neurology has recommended to continue dual antiplatelet therapy.  Diabetes mellitus, type 2 Uncontrolled with hyperglycemia. Hemoglobin A1C of 55-60% as mentioned above. Patient will need better glycemic control. [facility] consulted for PCP follow-up. Continue home insulin but this will need to be titrated for better control of Hemoglobin A1C. Will discontinue Glipizide in setting of concurrent insulin use. If patient able to follow-up with [facility], she may be able to obtain Lantus/Levemir.  CAD S/p PCI with unsuccessful balloon angioplasty. Currently on Brilinta, Coreg, Imdur, Lipitor  Chronic combined systolic and diastolic heart failure Compensated. Continue Coreg and losartan. Last EF of 55-60%  Essential hypertension Patient is on Clonidine, Coreg, Imdur and losartan. Continue on discharge.  Hyperlipidemia LDL of 90. Continue Lipitor  History of cocaine abuse No UDS obtained on admission.   Tobacco abuse Counseled.  Discharge Diagnoses:  Principal Problem:   Acute CVA (cerebrovascular accident) [facility]) Active Problems:   CAD S/P percutaneous coronary angioplasty   Tobacco abuse  Hypertension   Hyperglycemia due to type 2 diabetes mellitus  [facility])    Discharge Instructions  Discharge Instructions    Ambulatory referral to Neurology   Complete by: As directed    Follow up with stroke clinic NP (Jessica Vanschaick or Darrol Angel, if both not available, consider Manson Allan, or Ahern) at [facility] in about 4 weeks. Thanks.   Ambulatory referral to Physical Therapy   Complete by: As directed    Diet - low sodium heart healthy   Complete by: As directed    Increase activity slowly   Complete by: As directed      Allergies as of 03/24/2020      Reactions   Bee Venom Anaphylaxis   Contrast Media [iodinated Diagnostic Agents] Hives, Itching   Tomato Itching, Rash      Medication List    STOP taking these medications   cephALEXin 500 MG capsule Commonly known as: KEFLEX   glipiZIDE 5 MG tablet Commonly known as: GLUCOTROL   HYDROcodone-acetaminophen 5-325 MG tablet Commonly known as: NORCO/VICODIN     TAKE these medications   aspirin 81 MG chewable tablet Chew 81 mg by mouth daily.   atorvastatin 80 MG tablet Commonly known as: LIPITOR Take 80 mg by mouth daily at 6 PM.   carvedilol 12.5 MG tablet Commonly known as: COREG Take 1.5 tablets (18.75 mg total) by mouth 2 (two) times daily with a meal.   cloNIDine 0.1 MG tablet Commonly known as: CATAPRES Take 0.1 mg by mouth 2 (two) times daily.   cyclobenzaprine 10 MG tablet Commonly known as: FLEXERIL Take 10 mg by mouth 3 (three) times daily as needed for muscle spasms.   gabapentin 300 MG capsule Commonly known as: NEURONTIN Take 300 mg by mouth 3 (three) times daily.   HumuLIN 70/30 (70-30) 100 UNIT/ML injection Generic drug: insulin NPH-regular Human Inject 35 Units into the skin 2 (two) times daily.   HumuLIN R 100 units/mL injection Generic drug: insulin regular Inject 17 Units into the skin 2 (two) times daily before a meal.   isosorbide mononitrate 30 MG 24 hr tablet Commonly known as: IMDUR Take 1 tablet (30 mg total) by mouth daily.    losartan 25 MG tablet Commonly known as: COZAAR Take 1 tablet (25 mg total) by mouth daily.   montelukast 10 MG tablet Commonly known as: SINGULAIR Take 10 mg by mouth at bedtime.   nicotine 14 mg/24hr patch Commonly known as: NICODERM CQ - dosed in mg/24 hours Place 1 patch (14 mg total) onto the skin daily.   nitroGLYCERIN 0.4 MG SL tablet Commonly known as: NITROSTAT Place 1 tablet (0.4 mg total) under the tongue as needed for chest pain.   pantoprazole 40 MG tablet Commonly known as: PROTONIX Take 40 mg by mouth 2 (two) times daily.   polyethylene glycol powder 17 GM/SCOOP powder Commonly known as: GLYCOLAX/MIRALAX Take 17 g by mouth daily as needed for moderate constipation.   QUEtiapine 200 MG tablet Commonly known as: SEROQUEL Take 200 mg by mouth daily.   QUEtiapine 400 MG tablet Commonly known as: SEROQUEL Take 400 mg by mouth at bedtime.   ticagrelor 90 MG Tabs tablet Commonly known as: BRILINTA Take 1 tablet (90 mg total) by mouth 2 (two) times daily.   Ventolin HFA 108 (90 Base) MCG/ACT inhaler Generic drug: albuterol Inhale 2 puffs into the lungs every 6 (six) hours as needed for wheezing or shortness of breath.       Follow-up Information  [facility]. Schedule an appointment as soon as possible for a visit in 4 week(s).   Specialty: Neurology Contact information: [address] [place] [address] [phone]       [facility]. Go on 04/28/2020.   Why: Please attend your primary care medical appointment on Monday, 04/28/20, at 2:30pm.  You are on the cancellation list and the office will call you if they can move your appointment to an earlier date. Contact information: [address] [place] [address] [phone]       [facility] Follow up.   Specialty: Rehabilitation Why: The rehabilitation  center will call you to schedule your first appointment. Contact information: [address] [id number] [place] [address] [phone]       [facility]. Schedule an appointment as soon as possible for a visit in 1 week(s).   Why: Heart disease. Abnormal echo Contact information: [address] [place] [address] [phone]             Allergies  Allergen Reactions  . Bee Venom Anaphylaxis  . Contrast Media [Iodinated Diagnostic Agents] Hives and Itching  . Tomato Itching and Rash    Consultations:  Neurology   Procedures/Studies: CT HEAD WO CONTRAST  Result Date: 03/22/2020 CLINICAL DATA:  Right-sided weakness for 3 days. EXAM: CT HEAD WITHOUT CONTRAST TECHNIQUE: Contiguous axial images were obtained from the base of the skull through the vertex without intravenous contrast. COMPARISON:  None. FINDINGS: Brain: No subdural, epidural, or subarachnoid hemorrhage. Focal low-attenuation in the right basal ganglia extending superiorly into the corona radiata, age indeterminate. Focal low-attenuation in the white matter of of the lower left frontal lobe on axial image 12. No other evidence of acute ischemia or infarct. No mass effect or midline shift. Ventricles and sulci are unremarkable. Cerebellum, brainstem, and basal cisterns are normal. Vascular: No hyperdense vessel or unexpected calcification. Skull: Normal. Negative for fracture or focal lesion. Sinuses/Orbits: No acute finding. Other: None. IMPRESSION: 1. Focal white matter changes in the right corona radiata extending into the right basal ganglia and in the left frontal lobe, age indeterminate. No acute cortical ischemia identified. No other acute abnormalities. Electronically Signed   By: Gerome Sam III M.D   On: 03/22/2020 13:38   MR ANGIO HEAD WO CONTRAST  Result Date: 03/23/2020 CLINICAL DATA:  Stroke  follow-up. EXAM: MRA HEAD WITHOUT CONTRAST TECHNIQUE: Angiographic images of the Circle of Willis were obtained using MRA technique without intravenous contrast. COMPARISON:  Brain MRI from earlier today FINDINGS: Motion degraded study. Atheromatous irregularity of bilateral carotid siphons, accentuated by skull base artifact. The right supraclinoid ICA measures approximately 70% stenosis. There is also a prominent stenosis with near flow gap at the petrous cavernous junction on the left. Intracranial atheromatous type irregularity, most notably a high-grade left proximal M2 branch narrowing. No evidence of aneurysm. IMPRESSION: 1. Advanced, especially for age, intracranial atheromatous change. 2. 70% right supraclinoid ICA stenosis and high-grade narrowing at the left petrous cavernous junction. 3. Advanced left M2 segment stenosis. Electronically Signed   By: Marnee Spring M.D.   On: 03/23/2020 08:27   MR BRAIN WO CONTRAST  Result Date: 03/23/2020 CLINICAL DATA:  Right-sided numbness and pain EXAM: MRI HEAD WITHOUT CONTRAST MRI CERVICAL SPINE WITHOUT CONTRAST TECHNIQUE: Multiplanar, multiecho pulse sequences of the brain and surrounding structures, and cervical spine, to include the craniocervical junction and  cervicothoracic junction, were obtained without intravenous contrast. COMPARISON:  None. FINDINGS: MRI HEAD FINDINGS Brain: There is a small focus of abnormal diffusion restriction within the left thalamus. Hyperintense T2-weighted signal within the periventricular white matter, right greater than left. There are old bilateral small vessel infarcts of the basal ganglia. Normal volume of CSF spaces. No chronic microhemorrhage. Normal midline structures. Vascular: Normal flow voids. Skull and upper cervical spine: Normal marrow signal. Sinuses/Orbits: Negative. Other: None. MRI CERVICAL SPINE FINDINGS Alignment: Physiologic. Vertebrae: No fracture, evidence of discitis, or bone lesion. Cord: Normal  signal and morphology. Posterior Fossa, vertebral arteries, paraspinal tissues: Negative. Disc levels: C1-2: Unremarkable. C2-3: Normal. C3-4: Small left uncovertebral osteophyte.  No stenosis. C4-5: Normal. C5-6: Normal. C6-7: Normal. C7-T1: Normal. IMPRESSION: 1. Small focus of acute/subacute ischemia within the left thalamus. No hemorrhage or mass effect. 2. 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 3. Normal cervical spinal cord. 4. Mild cervical degenerative disc disease without spinal canal or neural foraminal stenosis. Electronically Signed   By: Deatra Robinson M.D.   On: 03/23/2020 04:22   MR Cervical Spine Wo Contrast  Result Date: 03/23/2020 CLINICAL DATA:  Right-sided numbness and pain EXAM: MRI HEAD WITHOUT CONTRAST MRI CERVICAL SPINE WITHOUT CONTRAST TECHNIQUE: Multiplanar, multiecho pulse sequences of the brain and surrounding structures, and cervical spine, to include the craniocervical junction and cervicothoracic junction, were obtained without intravenous contrast. COMPARISON:  None. FINDINGS: MRI HEAD FINDINGS Brain: There is a small focus of abnormal diffusion restriction within the left thalamus. Hyperintense T2-weighted signal within the periventricular white matter, right greater than left. There are old bilateral small vessel infarcts of the basal ganglia. Normal volume of CSF spaces. No chronic microhemorrhage. Normal midline structures. Vascular: Normal flow voids. Skull and upper cervical spine: Normal marrow signal. Sinuses/Orbits: Negative. Other: None. MRI CERVICAL SPINE FINDINGS Alignment: Physiologic. Vertebrae: No fracture, evidence of discitis, or bone lesion. Cord: Normal signal and morphology. Posterior Fossa, vertebral arteries, paraspinal tissues: Negative. Disc levels: C1-2: Unremarkable. C2-3: Normal. C3-4: Small left uncovertebral osteophyte.  No stenosis. C4-5: Normal. C5-6: Normal. C6-7: Normal. C7-T1: Normal. IMPRESSION: 1. Small focus of acute/subacute  ischemia within the left thalamus. No hemorrhage or mass effect. 2. 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 3. Normal cervical spinal cord. 4. Mild cervical degenerative disc disease without spinal canal or neural foraminal stenosis. Electronically Signed   By: Deatra Robinson M.D.   On: 03/23/2020 04:22   ECHOCARDIOGRAM COMPLETE  Result Date: 03/23/2020    ECHOCARDIOGRAM REPORT   Patient Name:   EUPHEMIA LINGERFELT Date of Exam: 03/23/2020 Medical Rec #:  [id number]        Height:       62.0 in Accession #:    [id number]       Weight:       184.0 lb Date of Birth:  [birth year]        BSA:          1.845 m Patient Age:    49 years         BP:           139/87 mmHg Patient Gender: F                HR:           87 bpm. Exam Location:  Inpatient Procedure: 2D Echo, Cardiac Doppler and Color Doppler Indications:    Stroke 434.91  History:        Patient has prior history of Echocardiogram examinations,  most                 recent 11/24/2019.  Sonographer:    Roosvelt Maser RDCS Referring Phys: [id number] VASUNDHRA RATHORE IMPRESSIONS  1. LVEF has improved since the prior study on 11/24/2019, from 45-50% to 55-60%. There is significant LVH predominantly in the apical segments, a cardiac MRI should be considered to evaluate for possible infiltrative and hypertrophic cardiomyopathies.  2. Left ventricular ejection fraction, by estimation, is 55 to 60%. The left ventricle has normal function. The left ventricle has no regional wall motion abnormalities. There is moderate concentric left ventricular hypertrophy. Left ventricular diastolic parameters are consistent with Grade I diastolic dysfunction (impaired relaxation). Elevated left atrial pressure.  3. Right ventricular systolic function is normal. The right ventricular size is normal.  4. The mitral valve is normal in structure. Mild mitral valve regurgitation. No evidence of mitral stenosis.  5. The aortic valve is tricuspid. Aortic valve regurgitation is moderate. Mild  to moderate aortic valve sclerosis/calcification is present, without any evidence of aortic stenosis.  6. The inferior vena cava is normal in size with greater than 50% respiratory variability, suggesting right atrial pressure of 3 mmHg. FINDINGS  Left Ventricle: Left ventricular ejection fraction, by estimation, is 55 to 60%. The left ventricle has normal function. The left ventricle has no regional wall motion abnormalities. The left ventricular internal cavity size was normal in size. There is  moderate concentric left ventricular hypertrophy. Left ventricular diastolic parameters are consistent with Grade I diastolic dysfunction (impaired relaxation). Elevated left atrial pressure. Right Ventricle: The right ventricular size is normal. No increase in right ventricular wall thickness. Right ventricular systolic function is normal. Left Atrium: Left atrial size was normal in size. Right Atrium: Right atrial size was normal in size. Pericardium: There is no evidence of pericardial effusion. Mitral Valve: The mitral valve is normal in structure. Normal mobility of the mitral valve leaflets. Mild mitral valve regurgitation. No evidence of mitral valve stenosis. Tricuspid Valve: The tricuspid valve is normal in structure. Tricuspid valve regurgitation is mild . No evidence of tricuspid stenosis. Aortic Valve: The aortic valve is tricuspid. . There is mild thickening and mild calcification of the aortic valve. Aortic valve regurgitation is moderate. Aortic regurgitation PHT measures 348 msec. Mild to moderate aortic valve sclerosis/calcification is present, without any evidence of aortic stenosis. There is mild thickening of the aortic valve. There is mild calcification of the aortic valve. Pulmonic Valve: The pulmonic valve was normal in structure. Pulmonic valve regurgitation is not visualized. No evidence of pulmonic stenosis. Aorta: The aortic root is normal in size and structure. Venous: The inferior vena cava is  normal in size with greater than 50% respiratory variability, suggesting right atrial pressure of 3 mmHg. IAS/Shunts: No atrial level shunt detected by color flow Doppler.  LEFT VENTRICLE PLAX 2D LVIDd:         3.60 cm     Diastology LVIDs:         2.40 cm     LV e' lateral:   5.33 cm/s LV PW:         1.20 cm     LV E/e' lateral: 18.8 LV IVS:        1.10 cm     LV e' medial:    5.03 cm/s LVOT diam:     1.80 cm     LV E/e' medial:  19.9 LV SV:         59 LV SV  Index:   32 LVOT Area:     2.54 cm  LV Volumes (MOD) LV vol d, MOD A4C: 80.5 ml LV vol s, MOD A4C: 37.8 ml LV SV MOD A4C:     80.5 ml RIGHT VENTRICLE RV Basal diam:  3.50 cm LEFT ATRIUM             Index       RIGHT ATRIUM           Index LA diam:        3.90 cm 2.11 cm/m  RA Area:     12.40 cm LA Vol (A2C):   57.0 ml 30.89 ml/m RA Volume:   25.70 ml  13.93 ml/m LA Vol (A4C):   49.0 ml 26.56 ml/m LA Biplane Vol: 53.5 ml 29.00 ml/m  AORTIC VALVE LVOT Vmax:   131.00 cm/s LVOT Vmean:  78.000 cm/s LVOT VTI:    0.231 m AI PHT:      348 msec  AORTA Ao Root diam: 2.80 cm Ao Asc diam:  3.10 cm MITRAL VALVE MV Area (PHT): 3.63 cm     SHUNTS MV Decel Time: 209 msec     Systemic VTI:  0.23 m MV E velocity: 100.00 cm/s  Systemic Diam: 1.80 cm MV A velocity: 132.00 cm/s MV E/A ratio:  0.76 Tobias Alexander MD Electronically signed by Tobias Alexander MD Signature Date/Time: 03/23/2020/4:03:01 PM    Final    VAS US CAROTID  Result Date: 03/24/2020 Carotid Arterial Duplex Study Indications:       CVA. Risk Factors:      Hypertension, hyperlipidemia, current smoker, coronary artery                    disease. Comparison Study:  No prior studies. Performing Technologist: Jean Rosenthal  Examination Guidelines: A complete evaluation includes B-mode imaging, spectral Doppler, color Doppler, and power Doppler as needed of all accessible portions of each vessel. Bilateral testing is considered an integral part of a complete examination. Limited examinations for reoccurring  indications may be performed as noted.  Right Carotid Findings: +----------+--------+--------+--------+------------------+------------------+           PSV cm/sEDV cm/sStenosisPlaque DescriptionComments           +----------+--------+--------+--------+------------------+------------------+ CCA Prox  118     23                                intimal thickening +----------+--------+--------+--------+------------------+------------------+ CCA Distal91      25                                intimal thickening +----------+--------+--------+--------+------------------+------------------+ ICA Prox  74      22      1-39%   heterogenous                         +----------+--------+--------+--------+------------------+------------------+ ICA Distal58      22                                                   +----------+--------+--------+--------+------------------+------------------+ ECA       135     18                                                   +----------+--------+--------+--------+------------------+------------------+ +----------+--------+-------+----------------+-------------------+  PSV cm/sEDV cmsDescribe        Arm Pressure (mmHG) +----------+--------+-------+----------------+-------------------+ Subclavian               Multiphasic, WNL                    +----------+--------+-------+----------------+-------------------+ +---------+--------+--+--------+--+---------+ VertebralPSV cm/s59EDV cm/s22Antegrade +---------+--------+--+--------+--+---------+  Left Carotid Findings: +----------+--------+--------+--------+------------------+------------------+           PSV cm/sEDV cm/sStenosisPlaque DescriptionComments           +----------+--------+--------+--------+------------------+------------------+ CCA Prox  106     20                                intimal thickening  +----------+--------+--------+--------+------------------+------------------+ CCA Distal109     17                                intimal thickening +----------+--------+--------+--------+------------------+------------------+ ICA Prox  64      18      1-39%   heterogenous                         +----------+--------+--------+--------+------------------+------------------+ ICA Distal78      30                                                   +----------+--------+--------+--------+------------------+------------------+ ECA       132     12                                                   +----------+--------+--------+--------+------------------+------------------+ +----------+--------+--------+----------------+-------------------+           PSV cm/sEDV cm/sDescribe        Arm Pressure (mmHG) +----------+--------+--------+----------------+-------------------+ Subclavian                Multiphasic, WNL                    +----------+--------+--------+----------------+-------------------+ +---------+--------+--+--------+--+---------+ VertebralPSV cm/s47EDV cm/s12Antegrade +---------+--------+--+--------+--+---------+   Summary: Right Carotid: Velocities in the right ICA are consistent with a 1-39% stenosis. Left Carotid: Velocities in the left ICA are consistent with a 1-39% stenosis. Vertebrals:  Bilateral vertebral arteries demonstrate antegrade flow. Subclavians: Normal flow hemodynamics were seen in bilateral subclavian              arteries. *See table(s) above for measurements and observations.  Electronically signed by Fabienne Bruns MD on 03/24/2020 at 5:11:03 PM.    Final     TRANSTHORACIC ECHOCARDIOGRAM (03/23/2020) IMPRESSIONS    1. LVEF has improved since the prior study on 11/24/2019, from 45-50% to  55-60%. There is significant LVH predominantly in the apical segments, a  cardiac MRI should be considered to evaluate for possible infiltrative and   hypertrophic cardiomyopathies.  2. Left ventricular ejection fraction, by estimation, is 55 to 60%. The  left ventricle has normal function. The left ventricle has no regional  wall motion abnormalities. There is moderate concentric left ventricular  hypertrophy. Left ventricular  diastolic parameters are consistent with Grade I diastolic dysfunction  (impaired relaxation). Elevated left atrial pressure.  3. Right  ventricular systolic function is normal. The right ventricular  size is normal.  4. The mitral valve is normal in structure. Mild mitral valve  regurgitation. No evidence of mitral stenosis.  5. The aortic valve is tricuspid. Aortic valve regurgitation is moderate.  Mild to moderate aortic valve sclerosis/calcification is present, without  any evidence of aortic stenosis.  6. The inferior vena cava is normal in size with greater than 50%  respiratory variability, suggesting right atrial pressure of 3 mmHg.    Subjective: No concerns today except for some continued right hand/arm numbness.  Discharge Exam: Vitals:   03/24/20 0958 03/24/20 1212  BP: (!) 137/92 135/89  Pulse: 90 86  Resp: 20 17  Temp:    SpO2:  100%   Vitals:   03/24/20 0825 03/24/20 0827 03/24/20 0958 03/24/20 1212  BP: (!) 175/89 (!) 165/89 (!) 137/92 135/89  Pulse: 85 82 90 86  Resp: Temp: 97.8 F (36.6 C)     TempSrc: Oral     SpO2: 100% 98%  100%  Weight:      Height:        General: Pt is alert, awake, not in acute distress Cardiovascular: RRR, S1/S2 +, no rubs, no gallops Respiratory: CTA bilaterally, no wheezing, no rhonchi Abdominal: Soft, NT, ND, bowel sounds + Extremities: no edema, no cyanosis Neuro: Right grip strength is slightly weaker than left. Otherwise extremity strength of 5/5. Numbness of right hand and arm    The results of significant diagnostics from this hospitalization (including imaging, microbiology, ancillary and laboratory) are listed below  for reference.     Microbiology: Recent Results (from the past 240 hour(s))  SARS Coronavirus 2 by RT PCR (hospital order, performed in [facility] hospital lab) Nasopharyngeal Nasopharyngeal Swab     Status: None   Collection Time: 03/23/20  4:50 AM   Specimen: Nasopharyngeal Swab  Result Value Ref Range Status   SARS Coronavirus 2 NEGATIVE NEGATIVE Final    Comment: (NOTE) SARS-CoV-2 target nucleic acids are NOT DETECTED.  The SARS-CoV-2 RNA is generally detectable in upper and lower respiratory specimens during the acute phase of infection. The lowest concentration of SARS-CoV-2 viral copies this assay can detect is 250 copies / mL. A negative result does not preclude SARS-CoV-2 infection and should not be used as the sole basis for treatment or other patient management decisions.  A negative result may occur with improper specimen collection / handling, submission of specimen other than nasopharyngeal swab, presence of viral mutation(s) within the areas targeted by this assay, and inadequate number of viral copies (<250 copies / mL). A negative result must be combined with clinical observations, patient history, and epidemiological information.  Fact Sheet for Patients:   BoilerBrush.com.cy  Fact Sheet for Healthcare Providers: https://pope.com/  This test is not yet approved or  cleared by the [place] FDA and has been authorized for detection and/or diagnosis of SARS-CoV-2 by FDA under an Emergency Use Authorization (EUA).  This EUA will remain in effect (meaning this test can be used) for the duration of the COVID-19 declaration under Section 564(b)(1) of the Act, 21 U.S.C. section 360bbb-3(b)(1), unless the authorization is terminated or revoked sooner.  Performed at [facility], [address], [place], [place] [address]      Labs: BNP (last 3 results) Recent Labs    09/19/19 1011  BNP 58.4   Basic  Metabolic Panel: Recent Labs  Lab 03/22/20 1244 03/22/20 1315 03/23/20  1237 03/23/20 1757  NA 135 136 137 134*  K 4.2 4.2 3.7 4.1  CL 102 106 104 102  CO2 17*  --  23 21*  GLUCOSE 354* 359* 238* 478*  BUN 11 13 12 13   CREATININE 1.16* 0.80 1.00 1.18*  CALCIUM 9.2  --  9.0 9.2   Liver Function Tests: Recent Labs  Lab 03/22/20 1244  AST 14*  ALT 15  ALKPHOS 69  BILITOT 0.3  PROT 7.4  ALBUMIN 3.8   No results for input(s): LIPASE, AMYLASE in the last 168 hours. No results for input(s): AMMONIA in the last 168 hours. CBC: Recent Labs  Lab 03/22/20 1244 03/22/20 1315  WBC 6.2  --   NEUTROABS 2.9  --   HGB 11.0* 11.6*  HCT 35.0* 34.0*  MCV 100.3*  --   PLT 261  --    Cardiac Enzymes: No results for input(s): CKTOTAL, CKMB, CKMBINDEX, TROPONINI in the last 168 hours. BNP: Invalid input(s): POCBNP CBG: Recent Labs  Lab 03/23/20 1225 03/23/20 1729 03/23/20 2044 03/24/20 0829 03/24/20 1210  GLUCAP 227* 436* 356* 283* 314*   D-Dimer No results for input(s): DDIMER in the last 72 hours. Hgb A1c Recent Labs    03/23/20 1237  HGBA1C 9.1*   Lipid Profile Recent Labs    03/23/20 1237  CHOL 166  HDL 37*  LDLCALC 90  TRIG 03/25/20*  CHOLHDL 4.5   Thyroid function studies No results for input(s): TSH, T4TOTAL, T3FREE, THYROIDAB in the last 72 hours.  Invalid input(s): FREET3 Anemia work up No results for input(s): VITAMINB12, FOLATE, FERRITIN, TIBC, IRON, RETICCTPCT in the last 72 hours. Urinalysis    Component Value Date/Time   COLORURINE YELLOW 02/06/2020 1455   APPEARANCEUR HAZY (A) 02/06/2020 1455   LABSPEC 1.020 02/06/2020 1455   PHURINE 6.0 02/06/2020 1455   GLUCOSEU NEGATIVE 02/06/2020 1455   HGBUR SMALL (A) 02/06/2020 1455   BILIRUBINUR NEGATIVE 02/06/2020 1455   KETONESUR NEGATIVE 02/06/2020 1455   PROTEINUR 30 (A) 02/06/2020 1455   NITRITE NEGATIVE 02/06/2020 1455   LEUKOCYTESUR SMALL (A) 02/06/2020 1455   Sepsis Labs Invalid input(s):  PROCALCITONIN,  WBC,  LACTICIDVEN Microbiology Recent Results (from the past 240 hour(s))  SARS Coronavirus 2 by RT PCR (hospital order, performed in [facility] hospital lab) Nasopharyngeal Nasopharyngeal Swab     Status: None   Collection Time: 03/23/20  4:50 AM   Specimen: Nasopharyngeal Swab  Result Value Ref Range Status   SARS Coronavirus 2 NEGATIVE NEGATIVE Final    Comment: (NOTE) SARS-CoV-2 target nucleic acids are NOT DETECTED.  The SARS-CoV-2 RNA is generally detectable in upper and lower respiratory specimens during the acute phase of infection. The lowest concentration of SARS-CoV-2 viral copies this assay can detect is 250 copies / mL. A negative result does not preclude SARS-CoV-2 infection and should not be used as the sole basis for treatment or other patient management decisions.  A negative result may occur with improper specimen collection / handling, submission of specimen other than nasopharyngeal swab, presence of viral mutation(s) within the areas targeted by this assay, and inadequate number of viral copies (<250 copies / mL). A negative result must be combined with clinical observations, patient history, and epidemiological information.  Fact Sheet for Patients:   03/25/20  Fact Sheet for Healthcare Providers: BoilerBrush.com.cy  This test is not yet approved or  cleared by the https://pope.com/ FDA and has been authorized for detection and/or diagnosis of SARS-CoV-2 by FDA under an Emergency  Use Authorization (EUA).  This EUA will remain in effect (meaning this test can be used) for the duration of the COVID-19 declaration under Section 564(b)(1) of the Act, 21 U.S.C. section 360bbb-3(b)(1), unless the authorization is terminated or revoked sooner.  Performed at [facility], [address] [place], [place] [address]      Time coordinating discharge: 35 minutes  SIGNED:   Jacquelin Hawking, MD [facility] 03/25/2020, 4:14 PM

## 2020-03-24 NOTE — Progress Notes (Signed)
Physical Therapy Treatment Patient Details Name: Maria Harper MRN: [id number] DOB: March 31, 1971 Today's Date: 03/24/2020    History of Present Illness Maria Harper is a 49 y.o. female with medical history significant CAD status post PCI, chronic combined systolic and diastolic CHF (EF 45 to 50% on echo done April 2021), hypertension, hyperlipidemia, insulin-dependent type 2 diabetes, cocaine abuse, tobacco use, GERD presenting with complaints of right sided numbness and pain.  Patient states since Thursday 8/19 5 AM, the entire right side of her body is "numb and hurting".  MRI shows, Small focus of acute/subacute ischemia within the left     PT Comments    Patient was received sitting EOB finishing breakfast and getting her medications from her nurse.  Vitals WNL. C/o R sided numbness and tingling that is worse in her R hand and foot. She stated this is not from diabetic neuropathy and started after the stroke. She transferred sit <> stand with supervision and ambulated approximately 125' with no AD. She stated her walking didn't feel as normal as usual and her legs felt weak. After the walk she felt tired and said that's about as far as she could walk right now. Patient was educated on seated marches and long arc quads to help strengthen her legs at EOS. Transferred sit to supine with Mod I. Patient was left in chair position in bed with all needs in reach and bed alarm set.    Follow Up Recommendations  Outpatient PT     Equipment Recommendations  None recommended by PT    Recommendations for Other Services       Precautions / Restrictions Precautions Precautions: Fall Restrictions Weight Bearing Restrictions: No    Mobility  Bed Mobility Overal bed mobility: Modified Independent                Transfers Overall transfer level: Needs assistance   Transfers: Sit to/from Stand Sit to Stand: Supervision         General transfer comment: Stood with no AD, no  LOB, and no use of UE's  Ambulation/Gait Ambulation/Gait assistance: Min guard Gait Distance (Feet): 125 Feet Assistive device: Rolling walker (2 wheeled) Gait Pattern/deviations: Step-through pattern;Decreased stride length Gait velocity: slower   General Gait Details: stated her walking "doesn't feel normal" and that "her legs feel weak." stated she felt tired after the walk and that about the furthest she is able to go   Stairs             Wheelchair Mobility    Modified Rankin (Stroke Patients Only)       Balance Overall balance assessment: Needs assistance Sitting-balance support: No upper extremity supported;Feet supported Sitting balance-Leahy Scale: Good     Standing balance support: No upper extremity supported;During functional activity Standing balance-Leahy Scale: Fair                              Cognition Arousal/Alertness: Awake/alert Behavior During Therapy: WFL for tasks assessed/performed;Flat affect Overall Cognitive Status: Within Functional Limits for tasks assessed                                        Exercises      General Comments        Pertinent Vitals/Pain Faces Pain Scale: Hurts little more Pain Location: Numbness in tingling on entire R side, but  worse in hand and foot. Pain Descriptors / Indicators: Tingling;Numbness Pain Intervention(s): Monitored during session;Limited activity within patient's tolerance    Home Living                      Prior Function            PT Goals (current goals can now be found in the care plan section) Acute Rehab PT Goals Patient Stated Goal: home PT Goal Formulation: With patient Time For Goal Achievement: 03/30/20 Potential to Achieve Goals: Good Progress towards PT goals: Progressing toward goals    Frequency    Min 3X/week      PT Plan Current plan remains appropriate    Co-evaluation              AM-PAC PT "6 Clicks"  Mobility   Outcome Measure  Help needed turning from your back to your side while in a flat bed without using bedrails?: None Help needed moving from lying on your back to sitting on the side of a flat bed without using bedrails?: None Help needed moving to and from a bed to a chair (including a wheelchair)?: A Little Help needed standing up from a chair using your arms (e.g., wheelchair or bedside chair)?: A Little Help needed to walk in hospital room?: A Little Help needed climbing 3-5 steps with a railing? : A Little 6 Click Score: 20    End of Session Equipment Utilized During Treatment: Gait belt Activity Tolerance: Patient tolerated treatment well Patient left: in bed;with call bell/phone within reach;with bed alarm set Nurse Communication: Mobility status PT Visit Diagnosis: Unsteadiness on feet (R26.81);Muscle weakness (generalized) (M62.81);Other symptoms and signs involving the nervous system (R29.898)     Time:  -     Charges:                        Elisha Ponder, SPT, ATC

## 2020-03-24 NOTE — Evaluation (Signed)
Occupational Therapy Evaluation Patient Details Name: Maria Harper MRN: [id number] DOB: 05-08-1971 Today's Date: 03/24/2020    History of Present Illness Maria Harper is a 49 y.o. female with medical history significant CAD status post PCI, chronic combined systolic and diastolic CHF (EF 45 to 50% on echo done April 2021), hypertension, hyperlipidemia, insulin-dependent type 2 diabetes, cocaine abuse, tobacco use, GERD presenting with complaints of right sided numbness and pain.  Patient states since Thursday 8/19 5 AM, the entire right side of her body is "numb and hurting".  MRI shows, Small focus of acute/subacute ischemia within the left    Clinical Impression   PTA, pt lives with husband and reports Independence with all daily tasks without use of AD. Pt presents now close to baseline. Pt's B UE strength WFL and symmetrical, but pt continues to experience R sided numbness/tingling sensation. Pt reports dropping items from R hand at times due to sensation deficits, but grasp and coordination motor movements intact. Educated pt on adaptive/compensatory strategies to implement if deficits continue on dominant R-side. Pt demonstrated dynamic balance challenges reaching across tray table and bending to floor to grab shoes from under bed without LOB. Recommend supervision for mobility and showering tasks at home to ensure safety initially. Recommend OP OT if sensation deficits continue.     Follow Up Recommendations  Outpatient OT;Other (comment) (consider OP OT if sensation deficits persist)    Equipment Recommendations  None recommended by OT    Recommendations for Other Services       Precautions / Restrictions Precautions Precautions: Fall;Other (comment) Precaution Comments: numbness/tingling R side Restrictions Weight Bearing Restrictions: No      Mobility Bed Mobility Overal bed mobility: Modified Independent                Transfers Overall transfer level:  Needs assistance Equipment used: None Transfers: Sit to/from Stand;Stand Pivot Transfers Sit to Stand: Independent Stand pivot transfers: Supervision       General transfer comment: Supervision for safety, no LOB or safety concerns    Balance Overall balance assessment: Needs assistance Sitting-balance support: No upper extremity supported;Feet supported Sitting balance-Leahy Scale: Good     Standing balance support: No upper extremity supported;During functional activity Standing balance-Leahy Scale: Good Standing balance comment: pt reaching across tray table in standing, reaching for shoes under bed without LOB                           ADL either performed or assessed with clinical judgement   ADL Overall ADL's : Needs assistance/impaired Eating/Feeding: Independent;Sitting   Grooming: Independent;Standing;Wash/dry face Grooming Details (indicate cue type and reason): Independent standing at sink, wash face, rinse out coffee cup Upper Body Bathing: Independent;Sitting   Lower Body Bathing: Supervison/ safety;Sit to/from stand   Upper Body Dressing : Independent;Sitting   Lower Body Dressing: Independent;Sit to/from stand   Toilet Transfer: Supervision/safety;Ambulation;Regular Teacher, adult education Details (indicate cue type and reason): Supervision for safety in room Toileting- Clothing Manipulation and Hygiene: Independent;Sit to/from stand       Functional mobility during ADLs: Supervision/safety General ADL Comments: Pt overall Supervision to Independent for ADLs and mobility. Pt with deficits in sensation impacting steadiness in standing a bit but no LOB.      Vision Baseline Vision/History: No visual deficits Patient Visual Report: No change from baseline Vision Assessment?: No apparent visual deficits     Perception     Praxis      Pertinent  Vitals/Pain Pain Assessment: Faces Faces Pain Scale: Hurts little more Pain Location: Numbness  in tingling on entire R side, but worse in hand and foot. Pain Descriptors / Indicators: Tingling;Numbness Pain Intervention(s): Patient requesting pain meds-RN notified     Hand Dominance Right   Extremity/Trunk Assessment Upper Extremity Assessment Upper Extremity Assessment: RUE deficits/detail RUE Deficits / Details: Strength of B UE 4/5 and symmetrical, but pt with numbness/tingling in R hand (grasp strength WFL), but reports dropping items without knowing.  RUE Sensation: decreased light touch   Lower Extremity Assessment Lower Extremity Assessment: Defer to PT evaluation   Cervical / Trunk Assessment Cervical / Trunk Assessment: Normal   Communication Communication Communication: No difficulties   Cognition Arousal/Alertness: Awake/alert Behavior During Therapy: WFL for tasks assessed/performed;Flat affect Overall Cognitive Status: Within Functional Limits for tasks assessed                                     General Comments  VSS on RA. Educated on compensatory/adaptive strategies to implement to maintain grasp on items until sensation normalizes with pt verbalizing understanding and reports ready to go home    Exercises     Shoulder Instructions      Home Living Family/patient expects to be discharged to:: Private residence Living Arrangements: Spouse/significant other Available Help at Discharge: Family;Available 24 hours/day Type of Home: Apartment Home Access: Level entry     Home Layout: One level     Bathroom Shower/Tub: Chief Strategy Officer: Standard     Home Equipment: None          Prior Functioning/Environment Level of Independence: Independent        Comments: Independent in all ADLs, IADLs and mobility without AD        OT Problem List: Impaired sensation      OT Treatment/Interventions:      OT Goals(Current goals can be found in the care plan section) Acute Rehab OT Goals Patient Stated Goal: go  home today OT Goal Formulation: All assessment and education complete, DC therapy  OT Frequency:     Barriers to D/C:            Co-evaluation              AM-PAC OT "6 Clicks" Daily Activity     Outcome Measure Help from another person eating meals?: None Help from another person taking care of personal grooming?: None Help from another person toileting, which includes using toliet, bedpan, or urinal?: A Little Help from another person bathing (including washing, rinsing, drying)?: A Little Help from another person to put on and taking off regular upper body clothing?: None Help from another person to put on and taking off regular lower body clothing?: None 6 Click Score: 22   End of Session Equipment Utilized During Treatment: Gait belt Nurse Communication: Patient requests pain meds  Activity Tolerance: Patient tolerated treatment well Patient left: in bed;with call bell/phone within reach;with bed alarm set  OT Visit Diagnosis: Unsteadiness on feet (R26.81);Other (comment) (decreased sensation)                Time: 3710-6269 OT Time Calculation (min): 18 min Charges:  OT General Charges $OT Visit: 1 Visit OT Evaluation $OT Eval Low Complexity: 1 Low  Lorre Munroe, OTR/L  Lorre Munroe 03/24/2020, 10:44 AM

## 2020-03-24 NOTE — Progress Notes (Signed)
STROKE TEAM PROGRESS NOTE   INTERVAL HISTORY Pt lying in bed. Still complains of right sided numbness. I also talked with her husband over the phone. Educated on stroke risk factor modification.   Vitals:   03/24/20 0245 03/24/20 0825 03/24/20 0827 03/24/20 0958  BP:  (!) 175/89 (!) 165/89 (!) 137/92  Pulse:  85 82 90  Resp:  19 17 20   Temp:  97.8 F (36.6 C)    TempSrc:  Oral    SpO2:  100% 98%   Weight: 87.4 kg     Height:       CBC:  Recent Labs  Lab 03/22/20 1244 03/22/20 1315  WBC 6.2  --   NEUTROABS 2.9  --   HGB 11.0* 11.6*  HCT 35.0* 34.0*  MCV 100.3*  --   PLT 261  --    Basic Metabolic Panel:  Recent Labs  Lab 03/23/20 1237 03/23/20 1757  NA 137 134*  K 3.7 4.1  CL 104 102  CO2 23 21*  GLUCOSE 238* 478*  BUN 12 13  CREATININE 1.00 1.18*  CALCIUM 9.0 9.2   Lipid Panel:  Recent Labs  Lab 03/23/20 1237  CHOL 166  TRIG 193*  HDL 37*  CHOLHDL 4.5  VLDL 39  LDLCALC 90   HgbA1c:  Recent Labs  Lab 03/23/20 1237  HGBA1C 9.1*   Urine Drug Screen: No results for input(s): LABOPIA, COCAINSCRNUR, LABBENZ, AMPHETMU, THCU, LABBARB in the last 168 hours.  Alcohol Level No results for input(s): ETH in the last 168 hours.  IMAGING past 24 hours ECHOCARDIOGRAM COMPLETE  Result Date: 03/23/2020    ECHOCARDIOGRAM REPORT   Patient Name:   ZITLALIC CANNIZZO Date of Exam: 03/23/2020 Medical Rec #:  [id number]        Height:       62.0 in Accession #:    [id number]       Weight:       184.0 lb Date of Birth:  17-Feb-1971        BSA:          1.845 m Patient Age:    49 years         BP:           139/87 mmHg Patient Gender: F                HR:           87 bpm. Exam Location:  Inpatient Procedure: 2D Echo, Cardiac Doppler and Color Doppler Indications:    Stroke 434.91  History:        Patient has prior history of Echocardiogram examinations, most                 recent 11/24/2019.  Sonographer:    Roosvelt Maser RDCS Referring Phys: [id number] VASUNDHRA RATHORE IMPRESSIONS   1. LVEF has improved since the prior study on 11/24/2019, from 45-50% to 55-60%. There is significant LVH predominantly in the apical segments, a cardiac MRI should be considered to evaluate for possible infiltrative and hypertrophic cardiomyopathies.  2. Left ventricular ejection fraction, by estimation, is 55 to 60%. The left ventricle has normal function. The left ventricle has no regional wall motion abnormalities. There is moderate concentric left ventricular hypertrophy. Left ventricular diastolic parameters are consistent with Grade I diastolic dysfunction (impaired relaxation). Elevated left atrial pressure.  3. Right ventricular systolic function is normal. The right ventricular size is normal.  4. The mitral valve is normal in structure. Mild  mitral valve regurgitation. No evidence of mitral stenosis.  5. The aortic valve is tricuspid. Aortic valve regurgitation is moderate. Mild to moderate aortic valve sclerosis/calcification is present, without any evidence of aortic stenosis.  6. The inferior vena cava is normal in size with greater than 50% respiratory variability, suggesting right atrial pressure of 3 mmHg. FINDINGS  Left Ventricle: Left ventricular ejection fraction, by estimation, is 55 to 60%. The left ventricle has normal function. The left ventricle has no regional wall motion abnormalities. The left ventricular internal cavity size was normal in size. There is  moderate concentric left ventricular hypertrophy. Left ventricular diastolic parameters are consistent with Grade I diastolic dysfunction (impaired relaxation). Elevated left atrial pressure. Right Ventricle: The right ventricular size is normal. No increase in right ventricular wall thickness. Right ventricular systolic function is normal. Left Atrium: Left atrial size was normal in size. Right Atrium: Right atrial size was normal in size. Pericardium: There is no evidence of pericardial effusion. Mitral Valve: The mitral valve is normal  in structure. Normal mobility of the mitral valve leaflets. Mild mitral valve regurgitation. No evidence of mitral valve stenosis. Tricuspid Valve: The tricuspid valve is normal in structure. Tricuspid valve regurgitation is mild . No evidence of tricuspid stenosis. Aortic Valve: The aortic valve is tricuspid. . There is mild thickening and mild calcification of the aortic valve. Aortic valve regurgitation is moderate. Aortic regurgitation PHT measures 348 msec. Mild to moderate aortic valve sclerosis/calcification is present, without any evidence of aortic stenosis. There is mild thickening of the aortic valve. There is mild calcification of the aortic valve. Pulmonic Valve: The pulmonic valve was normal in structure. Pulmonic valve regurgitation is not visualized. No evidence of pulmonic stenosis. Aorta: The aortic root is normal in size and structure. Venous: The inferior vena cava is normal in size with greater than 50% respiratory variability, suggesting right atrial pressure of 3 mmHg. IAS/Shunts: No atrial level shunt detected by color flow Doppler.  LEFT VENTRICLE PLAX 2D LVIDd:         3.60 cm     Diastology LVIDs:         2.40 cm     LV e' lateral:   5.33 cm/s LV PW:         1.20 cm     LV E/e' lateral: 18.8 LV IVS:        1.10 cm     LV e' medial:    5.03 cm/s LVOT diam:     1.80 cm     LV E/e' medial:  19.9 LV SV:         59 LV SV Index:   32 LVOT Area:     2.54 cm  LV Volumes (MOD) LV vol d, MOD A4C: 80.5 ml LV vol s, MOD A4C: 37.8 ml LV SV MOD A4C:     80.5 ml RIGHT VENTRICLE RV Basal diam:  3.50 cm LEFT ATRIUM             Index       RIGHT ATRIUM           Index LA diam:        3.90 cm 2.11 cm/m  RA Area:     12.40 cm LA Vol (A2C):   57.0 ml 30.89 ml/m RA Volume:   25.70 ml  13.93 ml/m LA Vol (A4C):   49.0 ml 26.56 ml/m LA Biplane Vol: 53.5 ml 29.00 ml/m  AORTIC VALVE LVOT Vmax:   131.00 cm/s  LVOT Vmean:  78.000 cm/s LVOT VTI:    0.231 m AI PHT:      348 msec  AORTA Ao Root diam: 2.80 cm Ao Asc  diam:  3.10 cm MITRAL VALVE MV Area (PHT): 3.63 cm     SHUNTS MV Decel Time: 209 msec     Systemic VTI:  0.23 m MV E velocity: 100.00 cm/s  Systemic Diam: 1.80 cm MV A velocity: 132.00 cm/s MV E/A ratio:  0.76 Tobias Alexander MD Electronically signed by Tobias Alexander MD Signature Date/Time: 03/23/2020/4:03:01 PM    Final     PHYSICAL EXAM  Temp:  [97.8 F (36.6 C)-98.6 F (37 C)] 97.8 F (36.6 C) (08/23 0825) Pulse Rate:  [82-96] 86 (08/23 1212) Resp:  [15-27] 17 (08/23 1212) BP: (135-175)/(71-95) 135/89 (08/23 1212) SpO2:  [98 %-100 %] 100 % (08/23 1212) Weight:  [87.4 kg] 87.4 kg (08/23 0245)  General - Well nourished, well developed, in no apparent distress.  Ophthalmologic - fundi not visualized due to noncooperation.  Cardiovascular - Regular rhythm and rate.  Mental Status -  Level of arousal and orientation to time, place, and person were intact. Language including expression, naming, repetition, comprehension was assessed and found intact. Fund of Knowledge was assessed and was intact.  Cranial Nerves II - XII - II - Visual field intact OU. III, IV, VI - Extraocular movements intact. V - Facial sensation decreased on the right. VII - Facial movement intact bilaterally. VIII - Hearing & vestibular intact bilaterally. X - Palate elevates symmetrically. XI - Chin turning & shoulder shrug intact bilaterally. XII - Tongue protrusion intact.  Motor Strength - The patient's strength was normal in all extremities and pronator drift was absent.  Bulk was normal and fasciculations were absent.   Motor Tone - Muscle tone was assessed at the neck and appendages and was normal.  Reflexes - The patient's reflexes were symmetrical in all extremities and she had no pathological reflexes.  Sensory - Light touch, temperature/pinprick were assessed and were decreased on the right, about 25% of the left.    Coordination - The patient had normal movements in the hands with no ataxia  or dysmetria.  Tremor was absent.  Gait and Station - deferred.   ASSESSMENT/PLAN Ms. Maria Harper is a 49 y.o. female with history of GERD, cocaine use, DM2, HTN, HLD presenting with R sided weakness.   Stroke:  L thalamic infarct secondary to small vessel disease source given stroke risk factors and location.  CT head age indeterminate white matter changes R corona radiata into R basal ganglia and in L frontal lobe.   MRI  Small acute/subacute L thalamic infarct. Old basal ganglia small vessel disease infarcts.   MRA  Advanced small vessel disease. R supraclinoid ICA stenosis and high-grade narrowing L petrous cavernous segment. Advanced L M2 stenosis.  Carotid Doppler  B ICA 1-39% stenosis, VAs antegrade   2D Echo EF 55-60 (improved from 45-50% in April 2021). No source of embolus   LDL 90  HgbA1c 9.1  UDS pending  VTE prophylaxis - Lovenox 40 mg sq daily   aspirin 81 mg daily and Brilinta (ticagrelor) 90 mg bid prior to admission, now on aspirin 81 mg daily and Brilinta (ticagrelor) 90 mg bid. Continue DAPT on discharge   Therapy recommendations:  OP PT, OP OT  Disposition:  pending   Hypertension  Stable . Long-term BP goal normotensive  Hyperlipidemia  Home meds:  liptior 80, resumed in hospital  LDL  90, goal < 70  Continue statin at discharge  Diabetes type II Uncontrolled  HgbA1c 9.1, goal < 7.0  CBGs   SSI  Close PCP follow up for better DM control  DM education  Tobacco abuse  Current smoker  Smoking cessation counseling provided  Nicotine patch   Pt is willing to quit    Other Stroke Risk Factors  No current ETOH use  Hx Substance abuse (crack cocaine) - UDS pending   Obesity, Body mass index is 35.24 kg/m., recommend weight loss, diet and exercise as appropriate   Coronary artery disease, hx MI s/p PCI - on DAPT  Hx combined chronic systolic and diastolic CHF - improved  Other Active Problems  AKI, Cre  1.16->0.80->1.00->1.18  Hospital day # 1  Neurology will sign off. Please call with questions. Pt will follow up with stroke clinic NP at [facility] in about 4 weeks. Thanks for the consult.  Marvel Plan, MD PhD Stroke Neurology 03/24/2020 12:24 PM    To contact Stroke Continuity provider, please refer to [email or web address]. After hours, contact General Neurology

## 2020-04-02 ENCOUNTER — Other Ambulatory Visit: Payer: Self-pay | Admitting: *Deleted

## 2020-04-02 NOTE — Patient Outreach (Signed)
[facility]) Care Management  04/02/2020  Maria Harper 02-Jul-1971 [id number]   Subjective: Telephone call to patient's home  / mobile number, no answer, left HIPAA compliant voicemail message, and requested call back.     Objective: Per KPN (Knowledge Performance Now, point of care tool) and chart review, patient hospitalized 03/22/2020 -03/24/2020 for Acute CVA.  Patient also has a history of CAD, cocaine abuse, tobacco abuse, hypertension, hyperlipidemia, chronic combined systolic diastolic congestive heart failure.      Assessment: Received Self Pay EMMI Stroke referral on 03/31/2020, no red flag alert present.   Referral source: patient called into [facility] Care Management office, left message with Care Management Assistant requesting call and no needs specified.   Received the following in- basket [place] Link / Epic message: [name] (I sent you email about this also with Emmi calls information that went out to this patient) Patient called stating she received calls from us-EMMI Stroke calls. She was in the hospital until 8/23. She did not answer when these calls were made so EMMI questions are not done.  Patient did want to have a nurse call her but when I asked if she had specific questions or concerns she said no, just trying to figure out how this works. Patient states at the hospital she was told that she was been signed up for a program but is not sure what program.   Per patient she does not have a PCP. She is aware that she needs to call neurologist to set up an appointment and has that information.  Her Call Back is [phone].  Thank you  [name]  [facility] EMMI follow up pending patient contact.      Plan: RNCM will send unsuccessful outreach letter, [facility] pamphlet, handout: Know Before You Go, will call patient for 2nd telephone outreach attempt within 4 business days, [facility] EMMI follow up, and will proceed with case closure, after 4th unsuccessful outreach  call.      Kenston Longton H. Gardiner Barefoot, BSN, CCM [facility] Care Management [facility] Telephonic CM Phone: [phone] Fax: [phone]

## 2020-04-03 ENCOUNTER — Other Ambulatory Visit: Payer: Self-pay | Admitting: *Deleted

## 2020-04-03 NOTE — Patient Outreach (Signed)
[facility]) Care Management  04/03/2020  Maria Harper [birth year] [id number]   Subjective: Telephone call to patient's home  / mobile number, no answer, left HIPAA compliant voicemail message, and requested call back.     Objective: Per KPN (Knowledge Performance Now, point of care tool) and chart review, patient hospitalized 03/22/2020 -03/24/2020 for Acute CVA.  Patient also has a history of CAD, cocaine abuse, tobacco abuse, hypertension, hyperlipidemia, chronic combined systolic diastolic congestive heart failure.      Assessment: Received Self Pay EMMI Stroke referral on 03/31/2020, no red flag alert present.   Referral source: patient called into [facility] Care Management office, left message with Care Management Assistant requesting call and no needs specified.   Received the following in- basket [place] Link / Epic message: [name] (I sent you email about this also with Emmi calls information that went out to this patient) Patient called stating she received calls from us-EMMI Stroke calls. She was in the hospital until 8/23. She did not answer when these calls were made so EMMI questions are not done.  Patient did want to have a nurse call her but when I asked if she had specific questions or concerns she said no, just trying to figure out how this works. Patient states at the hospital she was told that she was been signed up for a program but is not sure what program.   Per patient she does not have a PCP. She is aware that she needs to call neurologist to set up an appointment and has that information.  Her Call Back is [phone].  Thank you  [name]  [facility] EMMI follow up pending patient contact.      Plan: RNCM has sent unsuccessful outreach letter, [facility] pamphlet, handout: Know Before You Go, will call patient for 3rd telephone outreach attempt within 4 business days, [facility] EMMI follow up, and will proceed with case closure, after 4th unsuccessful  outreach call.      Seyed Heffley H. Gardiner Barefoot, BSN, CCM [facility] Care Management [facility] Telephonic CM Phone: [phone] Fax: [phone]

## 2020-04-04 ENCOUNTER — Ambulatory Visit: Payer: Self-pay | Attending: Family Medicine | Admitting: Physical Therapy

## 2020-04-09 ENCOUNTER — Other Ambulatory Visit (HOSPITAL_BASED_OUTPATIENT_CLINIC_OR_DEPARTMENT_OTHER): Payer: Self-pay

## 2020-04-09 ENCOUNTER — Other Ambulatory Visit: Payer: Self-pay | Admitting: *Deleted

## 2020-04-09 DIAGNOSIS — R5383 Other fatigue: Secondary | ICD-10-CM

## 2020-04-09 DIAGNOSIS — R0681 Apnea, not elsewhere classified: Secondary | ICD-10-CM

## 2020-04-09 NOTE — Patient Outreach (Signed)
[facility]) Care Management  04/09/2020  Jacinta Penalver Mar 15, 1971 [id number]   Subjective: Telephone call to patient's home / mobile number, spoke with patient, and HIPAA verified.  Discussed [facility] Care Management Self Pay EMMI Stroke follow up, patient voiced understanding, and is in agreement to brief follow up.  Patient states she is doing ok, currently at primary providers office for a hospital follow up appointment, had the below question, and requested call back at a later time.  States she remembers receiving EMMI automated calls and has the following question: right side of face still numb, numbness radiating down right side, getting worse, and was wanting to know what she should do?    RNCM advised patient to discuss her question with provider during current visit, patient voices understanding, and states she will follow up.   Patient states she is aware of when to go to the ED.   Patient states she also has not received a call regarding follow up appointments to date with neurology, with neuro rehab, with cardiology, and today is planning to follow up.    RNCM will call patient back at a later time per her request.    Objective:Per KPN (Knowledge Performance Now, point of care tool) and chart review,patient hospitalized 03/22/2020 -03/24/2020 for Acute CVA. Patient also has a history of CAD, cocaine abuse, tobacco abuse, hypertension, hyperlipidemia, chronic combined systolic diastolic congestive heart failure.     Assessment: Received Self Pay EMMI Stroke referral on 03/31/2020, no red flag alert present. Referral source: patient called into [facility] Care Management office, left message with Care Management Assistant requesting call and no needs specified. Received the following in- basket  Link / Epic message: [name] (I sent you email about this also with Emmi calls information that went out to this patient) Patient called stating she received calls from  us-EMMI Stroke calls. She was in the hospital until 8/23. She did not answer when these calls were made so EMMI questions are not done.  Patient did want to have a nurse call her but when I asked if she had specific questions or concerns she said no, just trying to figure out how this works. Patient states at the hospital she was told that she was been signed up for a program but is not sure what program.  Per patient she does not have a PCP. She is aware that she needs to call neurologist to set up an appointment and has that information.  Her CallBackis [phone].  Thank you  [name]  [facility] EMMI follow up pending patient contact.     Plan:RNCM has sent unsuccessful outreach letter, [facility] pamphlet, handout: Know Before You Go, will call patient for 4th  telephone outreach attempt within 4 business days per patient's request, [facility] EMMI follow up, andwill proceed with case closure, after 4th unsuccessful outreach call.     Matalie Romberger H. Gardiner Barefoot, BSN, CCM [facility] Care Management [facility] Telephonic CM Phone: [phone] Fax: [phone]

## 2020-04-10 ENCOUNTER — Other Ambulatory Visit: Payer: Self-pay | Admitting: *Deleted

## 2020-04-10 NOTE — Patient Outreach (Addendum)
[facility]) Care Management  04/10/2020  Maria Harper April 02, 1971 [id number]   Subjective: Received voicemail message from Maria Harper, requested call back regarding a letter that she received.  Telephone call to patient's home / mobile number, spoke with patient, and HIPAA verified.  Discussed [name] Care Management Self Pay EMMI Stroke  follow up, patient voiced understanding, and is in agreement to a brief follow up.   Patient states she is doing okay, had a follow up appointment with her "Psych MD", not her primary provider on 04/09/2020,  is understanding the right side numbness, and her current condition.   States she received a letter from [name], stating that a bill was paid for her, was not sure what this was about, RNCM encouraged patient to call number on the letter to ask her questions, voiced understanding, and today states she will call.   States she has a follow up visit with her "Psych MD" every Wednesday.  Patient states she will have a hospital follow up visit with primary provider on 04/15/2020 and with neuro rehab on 04/11/2020.   States she was told at the hospital someone would be sending her an application to complete for assistance and she has not received it.   States she does not know anything about a Medicaid application and will not be able to pay for tomorrow neuro rehab, and is considering cancelling the appointment.   RNCM advised patient to go to her appointment and to share with the facility what she shared with this RNCM regarding application status.  Patient in agreement and states she will go to her appointment.  RNCM will follow up with [name] Care Management Clinical Director and call patient with an update.  Patient states she does not want to lose her services with [name].   RNCM advised patient [name] Care Management services are an additional benefit that she is eligible to receive and this will not negate any other services  she is already receiving.  Patient in agreement to complete assessment at a later time and does not have any other questions at this time.   Case discussed with Maria Harper Clinical Director at [facility]  Care Management. Email sent to [employer] at [name], requesting outreach call to patient to answer patient's questions, update on Medicaid application status, and disability application status.    Objective:Per KPN (Knowledge Performance Now, point of care tool) and chart review,patient hospitalized 03/22/2020 -03/24/2020 for Acute CVA. Patient also has a history of CAD, cocaine abuse, tobacco abuse, hypertension, hyperlipidemia, chronic combined systolic diastolic congestive heart failure.     Assessment: Received Self Pay EMMI Stroke referral on 03/31/2020, no red flag alert present. Referral source: patient called into [name] Care Management office, left message with Care Management Assistant requesting call and no needs specified. Received the following in- basket [name] message: Maria Harper (I sent you email about this also with Emmi calls information that went out to this patient) Patient called stating she received calls from us-EMMI Stroke calls. She was in the hospital until 8/23. She did not answer when these calls were made so EMMI questions are not done.  Patient did want to have a nurse call her but when I asked if she had specific questions or concerns she said no, just trying to figure out how this works. Patient states at the hospital she was told that she was been signed up for a program but  is not sure what program.  Per patient she does not have a PCP. She is aware that she needs to call neurologist to set up an appointment and has that information.  Her CallBackis [phone].  Thank you  Maria Harper  [facility] EMMI follow up pending patient contact.     Plan:RNCMwill call patient for  telephone outreach attempt within 7   business days per patient's request, [facility] EMMI follow up, andwill proceed with case closure, after 4th unsuccessful outreach call.     Maria Harper H. Gardiner Barefoot, BSN, CCM [facility] Care Management [facility] Telephonic CM Phone: [phone] Fax: [phone]

## 2020-04-14 NOTE — Progress Notes (Signed)
Cardiology Office Note   Date:  04/16/2020   ID:  Maria Harper, DOB 1971-03-29, MRN [id number]  PCP:  Lavinia Sharps, NP  Cardiologist:  Bryan Lemma, MD EP: None  Chief Complaint  Patient presents with  . Hospitalization Follow-up      History of Present Illness: Maria Harper is a 49 y.o. female with a PMH of CAD s/p STEMI 11/2019 with previous PCI/DES LAD in 2017, chronic combined CHF/ICM, HTN, HLD, DM type 2, GERD, cocaine abuse, tobacco abuse, and recent CVA 03/2020 who presents for post-hospital follow-up.   She was last evaluated by cardiology during her hospitalization 11/2019 at the time of her STEMI. She was taken emergently to the cath lab where she was found to have 100% dLAD stenosis and 100% 3rd diagonal stenosis though unsuccessful balloon angioplasty due to inability to advance into distal vessel, dLAD lesion was reduced to 70% stenosis after PCI attempts. Echo that admission showed EF 45-50%, G1DD, and no significant valvular abnormalities. She was discharged home on aspirin and brilinta, in addition to atorvastatin, carvedilol, losartan, clonidine, and imdur. She was subsequently lost to follow-up.    She was recently admitted to the hospital 03/2020 for an acute CVA, felt to be 2/2 small vessel disease. Echo that admission showed improvement in EF to 55-60%, moderate concentric LVH (consider cardiac MRI to evaluate for possible infiltrative and hypertrophic cardiomyopathies), G1DD, and mild MR. Carotid dopplers did not reveal significant stenosis. She was discharged home with recommendations to continue DAPT with aspirin and brilinta.   She presents today for post-hospital follow-up with her husband. She continues to have right sided numbness/weakness following her recent stroke. She asks about the etiology of her stroke. 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 We discussed the importance of managing risk factors to  prevent progression of disease including improved BP, cholesterol, and diabetes control (all of which are above goal), and smoking cessation. She reports having ongoing episodes of chest pressure, for which she has been taking SL nitro with relief of symptoms after 1 dose. Symptoms are not as severe as her prior heart attacks. She does have some DOE. She describes symptoms c/f OSA - snoring, daytime somnolence, apneic episodes (per husband), and PND. She is scheduled for a sleep study in the next month. No complaints of LE edema. She does report missing doses of her aspirin on occasion. We discussed the importance of compliance with her DAPT. Her BP is generally above goal, though she reports running out of her clonidine last Friday and she has only been taking carvedilol 6.25mg  once daily.       Past Medical History:  Diagnosis Date  . Acid reflux   . Cocaine abuse (HCC)   . Diabetes mellitus without complication (HCC)   . Heart attack (HCC) 04/2016  . High cholesterol   . Hypertension     Past Surgical History:  Procedure Laterality Date  . CORONARY/GRAFT ACUTE MI REVASCULARIZATION N/A 11/23/2019   Procedure: CORONARY/GRAFT ACUTE MI REVASCULARIZATION;  Surgeon: [name], MD;  Location: [facility] INVASIVE CV LAB;  Service: Cardiovascular;  Laterality: N/A;  . LEFT HEART CATH AND CORONARY ANGIOGRAPHY N/A 11/23/2019   Procedure: LEFT HEART CATH AND CORONARY ANGIOGRAPHY;  Surgeon: [name], MD;  Location: [facility] INVASIVE CV LAB;  Service: Cardiovascular;  Laterality: N/A;     Current Outpatient Medications  Medication Sig Dispense Refill  . albuterol (VENTOLIN HFA) 108 (90 Base) MCG/ACT inhaler Inhale  2 puffs into the lungs every 6 (six) hours as needed for wheezing or shortness of breath.    Marland Kitchen aspirin 81 MG chewable tablet Chew 81 mg by mouth daily.    Marland Kitchen atorvastatin (LIPITOR) 80 MG tablet Take 80 mg by mouth daily at 6 PM.     . busPIRone (BUSPAR) 15 MG tablet Take 15 mg by mouth 3  (three) times daily.    . carvedilol (COREG) 12.5 MG tablet Take 1 tablet (12.5 mg total) by mouth 2 (two) times daily with a meal. 180 tablet 3  . cyclobenzaprine (FLEXERIL) 10 MG tablet Take 10 mg by mouth 3 (three) times daily as needed for muscle spasms.     Marland Kitchen doxepin (SINEQUAN) 50 MG capsule Take 50 mg by mouth at bedtime.    . gabapentin (NEURONTIN) 300 MG capsule Take 300 mg by mouth 3 (three) times daily.    Marland Kitchen HUMULIN R 100 UNIT/ML injection Inject 17 Units into the skin 2 (two) times daily before a meal.   0  . insulin NPH-regular Human (HUMULIN 70/30) (70-30) 100 UNIT/ML injection Inject 35 Units into the skin 2 (two) times daily.     . isosorbide mononitrate (IMDUR) 60 MG 24 hr tablet Take 1 tablet (60 mg total) by mouth daily. 90 tablet 3  . losartan (COZAAR) 25 MG tablet Take 1 tablet (25 mg total) by mouth daily. 30 tablet 2  . montelukast (SINGULAIR) 10 MG tablet Take 10 mg by mouth at bedtime.    . nicotine (NICODERM CQ - DOSED IN MG/24 HOURS) 14 mg/24hr patch Place 1 patch (14 mg total) onto the skin daily. 28 patch 0  . nitroGLYCERIN (NITROSTAT) 0.4 MG SL tablet Place 1 tablet (0.4 mg total) under the tongue as needed for chest pain. 25 tablet 2  . pantoprazole (PROTONIX) 40 MG tablet Take 40 mg by mouth 2 (two) times daily.     . polyethylene glycol powder (GLYCOLAX/MIRALAX) powder Take 17 g by mouth daily as needed for moderate constipation.     . QUEtiapine (SEROQUEL) 200 MG tablet Take 200 mg by mouth daily.     . QUEtiapine (SEROQUEL) 400 MG tablet Take 400 mg by mouth at bedtime.    . ticagrelor (BRILINTA) 90 MG TABS tablet Take 1 tablet (90 mg total) by mouth 2 (two) times daily. 60 tablet 11  . WELLBUTRIN SR 100 MG 12 hr tablet Take 100 mg by mouth 2 (two) times daily.    . empagliflozin (JARDIANCE) 10 MG TABS tablet Take 1 tablet (10 mg total) by mouth daily before breakfast. 90 tablet 3   No current facility-administered medications for this visit.    Allergies:    Bee venom, Contrast media [iodinated diagnostic agents], and Tomato    Social History:  The patient  reports that she has been smoking cigarettes. She has a 30.00 pack-year smoking history. She has never used smokeless tobacco. She reports previous alcohol use. She reports current drug use. Drug: "Crack" cocaine.   Family History:  The patient's family history includes Diabetes in her father, maternal grandfather, maternal grandmother, and mother.    ROS:  Please see the history of present illness.   Otherwise, review of systems are positive for none.   All other systems are reviewed and negative.    PHYSICAL EXAM: VS:  BP (!) 144/88   Pulse (!) 105   Ht 5\' 2"  (1.575 m)   Wt 181 lb (82.1 kg)   SpO2 100%  BMI 33.11 kg/m  , BMI Body mass index is 33.11 kg/m. GEN: Well nourished, well developed, in no acute distress HEENT: sclera anicteric Neck: no JVD, carotid bruits, or masses Cardiac: RRR; no murmurs, rubs, or gallops, no edema  Respiratory:  clear to auscultation bilaterally, normal work of breathing GI: soft, nontender, nondistended, + BS MS: no deformity or atrophy Skin: warm and dry, no rash Neuro:  Strength and sensation are intact Psych: euthymic mood, full affect   EKG:  EKG is ordered today. The ekg ordered today demonstrates sinus tachycardia, rate 105 bpm, no STE/D, non-specific T wave abnormalities, no significant change from previous.    Recent Labs: 09/19/2019: B Natriuretic Peptide 58.4 03/22/2020: ALT 15; Hemoglobin 11.6; Platelets 261 03/23/2020: BUN 13; Creatinine, Ser 1.18; Potassium 4.1; Sodium 134    Lipid Panel    Component Value Date/Time   CHOL 166 03/23/2020 1237   TRIG 193 (H) 03/23/2020 1237   HDL 37 (L) 03/23/2020 1237   CHOLHDL 4.5 03/23/2020 1237   VLDL 39 03/23/2020 1237   LDLCALC 90 03/23/2020 1237      Wt Readings from Last 3 Encounters:  04/15/20 181 lb (82.1 kg)  03/24/20 192 lb 10.9 oz (87.4 kg)  02/06/20 170 lb (77.1 kg)       Other studies Reviewed: Additional studies/ records that were reviewed today include:   Left Heart Catheterization 11/23/2019:  Previously placed Mid LAD to Dist LAD stent (unknown type) is widely patent.  Mid LAD lesion is 30% stenosed.  CULPRIT LESION: Dist LAD-1 lesion is 100% stenosed with 100% stenosed side branch in 3rd Diag.  Unsuccessful balloon angioplasty was performed using a BALLOON SAPPHIRE 2.0X12 -unable to advance into the distal vessel.  Post intervention, there is a 70% residual stenosis followed by Dist LAD-2 lesion is 99% stenosed. Post intervention, the side branch was remained at 100% residual stenosis.  --------------------------  RPDA lesion is 80% stenosed-tapers into very small vessel..  LV end diastolic pressure is severely elevated. Therefore no the ventriculogram was performed.  There is no aortic valve stenosis.  Diagnostic Dominance: Right  Intervention    _____________   Echocardiogram 11/24/2019: Impressions: 1. Left ventricular ejection fraction, by estimation, is 45 to 50%. The  left ventricle has mildly decreased function. The left ventricle has no  regional wall motion abnormalities. Left ventricular diastolic parameters  are consistent with Grade I  diastolic dysfunction (impaired relaxation). Elevated left atrial  pressure.  2. Right ventricular systolic function is normal. The right ventricular  size is normal. There is normal pulmonary artery systolic pressure.  3. The mitral valve is normal in structure. No evidence of mitral valve  regurgitation. No evidence of mitral stenosis.  4. The aortic valve is tricuspid. Aortic valve regurgitation is moderate  to severe. Mild to moderate aortic valve sclerosis/calcification is  present, without any evidence of aortic stenosis.  5. The inferior vena cava is normal in size with greater than 50%  respiratory variability, suggesting right atrial pressure of 3  mmHg.  Echocardiogram 03/2020: 1. LVEF has improved since the prior study on 11/24/2019, from 45-50% to  55-60%. There is significant LVH predominantly in the apical segments, a  cardiac MRI should be considered to evaluate for possible infiltrative and  hypertrophic cardiomyopathies.  2. Left ventricular ejection fraction, by estimation, is 55 to 60%. The  left ventricle has normal function. The left ventricle has no regional  wall motion abnormalities. There is moderate concentric left ventricular  hypertrophy. Left ventricular  diastolic parameters are consistent with Grade I diastolic dysfunction  (impaired relaxation). Elevated left atrial pressure.  3. Right ventricular systolic function is normal. The right ventricular  size is normal.  4. The mitral valve is normal in structure. Mild mitral valve  regurgitation. No evidence of mitral stenosis.  5. The aortic valve is tricuspid. Aortic valve regurgitation is moderate.  Mild to moderate aortic valve sclerosis/calcification is present, without  any evidence of aortic stenosis.  6. The inferior vena cava is normal in size with greater than 50%  respiratory variability, suggesting right atrial pressure of 3 mmHg.   ASSESSMENT AND PLAN:  1. CAD s/p STEMI 11/2019 with PCI to dLAD with prior PCI/DES to mLAD in 2017: she does report continued episodes of chest pressure which seem to have been going on since her MI 11/2019. It is nitro responsive with relieve of symptoms after 1 SL nitro. There is some concern regarding medication compliance. She has missed doses of aspirin on occasion. Her last cath showed small vessel distal disease in the RCA and LAD otherwise no high grade stenosis. BP, LDL, and A1C remain above goal and she continues to smoke. Possible she has had some progression. Recent echo 03/2020 showed improvement in EF to 55-60% with no RWMA. EKG today is non-ischemic. Favor trial of medical management given extent of her small  vessel disease - Continue aspirin and brilinta - Continue statin - Will increase imdur to 60mg  daily - Will increase carvedilol to 12.5mg  BID - Could consider further ischemic testing pending response to medical management  2. Chronic combined CHF/ischemic cardiomyopathy: EF 45-50% at the time of her STEMI 11/2019 with improvement in EF to 55-60% 03/2020 at the time of her CVA. No volume overload complaints and she appears euvolemic - Continue carvedilol, losartan, and imdur  3. LV hypertrophy: noted to have moderate LVH on echo 03/2020 - recommended for a cardiac MR to evaluate for infiltrative vs hypertrophic cardiomyopathy - Will discuss this further at her follow-up visit  4. HTN: BP 144/88 today. She has been off clonidine for several days and was only taking clonidine 6.25mg  daily - Will continue to hold clonidine - not ideal if compliance concerns - Instructed her to take carvedilol 12.5mg  BID - will consider further titration of additional medications if BP remains above goal at follow-up - Continue losartan and imdur  5. HLD: LDL 90 03/2020; goal <70 - Continue atorvastatin - Will refer to the lipid clinic for PSK9-inhibitor consideration given LDL above goal despite max statin therapyy  6. DM type 2: A1C 9.1 03/2020, remains above goal despite insulin use - Will start jardiance 10mg  daily. Plan to repeat BMET at follow-up in 2 weeks.  - Continue insulin - Encouraged her to follow-up with her PCP given recent medication changes  7. Suspected OSA: she reports daytime somnolence, PND, and husband reports snoring and apneic episodes. She is scheduled for a sleep study within the month - Encouraged her to complete the sleep study as her symptoms sound c/f OSA  8. Tobacco abuse: still smoking 1/2 ppd. Both she and her husband smoke. Had a long conversation with them regarding health risks of continued use. Encouraged them to quit together - Continue to encourage cessation  9. Cocaine  abuse: denies recent use - Continue to encourage abstinence    Current medicines are reviewed at length with the patient today.  The patient does not have concerns regarding medicines.  The following changes have been made:  As above  Labs/ tests ordered today include:   Orders Placed This Encounter  Procedures  . AMB Referral to [facility]  . EKG 12-Lead     Disposition:   FU with me in 2 weeks  Signed, Beatriz Stallion, PA-C  04/16/2020 8:38 PM

## 2020-04-15 ENCOUNTER — Ambulatory Visit (INDEPENDENT_AMBULATORY_CARE_PROVIDER_SITE_OTHER): Payer: Self-pay | Admitting: Medical

## 2020-04-15 ENCOUNTER — Encounter: Payer: Self-pay | Admitting: Medical

## 2020-04-15 ENCOUNTER — Other Ambulatory Visit: Payer: Self-pay

## 2020-04-15 ENCOUNTER — Telehealth: Payer: Self-pay

## 2020-04-15 VITALS — BP 144/88 | HR 105 | Ht 62.0 in | Wt 181.0 lb

## 2020-04-15 DIAGNOSIS — I517 Cardiomegaly: Secondary | ICD-10-CM

## 2020-04-15 DIAGNOSIS — I10 Essential (primary) hypertension: Secondary | ICD-10-CM

## 2020-04-15 DIAGNOSIS — I2510 Atherosclerotic heart disease of native coronary artery without angina pectoris: Secondary | ICD-10-CM

## 2020-04-15 DIAGNOSIS — I255 Ischemic cardiomyopathy: Secondary | ICD-10-CM

## 2020-04-15 DIAGNOSIS — E7800 Pure hypercholesterolemia, unspecified: Secondary | ICD-10-CM

## 2020-04-15 DIAGNOSIS — E119 Type 2 diabetes mellitus without complications: Secondary | ICD-10-CM

## 2020-04-15 DIAGNOSIS — F1410 Cocaine abuse, uncomplicated: Secondary | ICD-10-CM

## 2020-04-15 DIAGNOSIS — I5042 Chronic combined systolic (congestive) and diastolic (congestive) heart failure: Secondary | ICD-10-CM

## 2020-04-15 DIAGNOSIS — E785 Hyperlipidemia, unspecified: Secondary | ICD-10-CM

## 2020-04-15 DIAGNOSIS — Z720 Tobacco use: Secondary | ICD-10-CM

## 2020-04-15 MED ORDER — NITROGLYCERIN 0.4 MG SL SUBL: 0.4000 mg | SUBLINGUAL_TABLET | SUBLINGUAL | 2 refills | Status: DC | PRN

## 2020-04-15 MED ORDER — EMPAGLIFLOZIN 10 MG PO TABS: 10.0000 mg | ORAL_TABLET | Freq: Every day | ORAL | 3 refills | Status: DC

## 2020-04-15 MED ORDER — CARVEDILOL 12.5 MG PO TABS
12.5000 mg | ORAL_TABLET | Freq: Two times a day (BID) | ORAL | 3 refills | Status: DC
Start: 2020-04-15 — End: 2020-07-15

## 2020-04-15 MED ORDER — ISOSORBIDE MONONITRATE ER 60 MG PO TB24: 60.0000 mg | ORAL_TABLET | Freq: Every day | ORAL | 3 refills | Status: DC

## 2020-04-15 NOTE — Telephone Encounter (Signed)
Pt called directly into triage asking for the offices address. She has an appt with Judy Pimple today. Gave [place] address and directions to get her. Pt verbalized understanding with no other questions at this time.

## 2020-04-15 NOTE — Patient Instructions (Addendum)
Medication Instructions:   TAKE Aspirin and Brillinta EVERY day as prescribed  START JARDIANCE 10 MG DAILY  INCREASE CARVEDILOL (COREG) TO 12.5 MG 2 TIMES A DAY  INCREASE ISOSORBIDE MONOITRATE (IMDUR) TO 60 MG DAILY  *If you need a refill on your cardiac medications before your next appointment, please call your pharmacy*  Lab Work: NONE ordered at this time of appointment   If you have labs (blood work) drawn today and your tests are completely normal, you will receive your results only by: Marland Kitchen MyChart Message (if you have MyChart) OR . A paper copy in the mail If you have any lab test that is abnormal or we need to change your treatment, we will call you to review the results.  Testing/Procedures: NONE ordered at this time of appointment   Follow-Up: At [facility], you and your health needs are our priority.  As part of our continuing mission to provide you with exceptional heart care, we have created designated Provider Care Teams.  These Care Teams include your primary Cardiologist (physician) and Advanced Practice Providers (APPs -  Physician Assistants and Nurse Practitioners) who all work together to provide you with the care you need, when you need it.  We recommend signing up for the patient portal called "MyChart".  Sign up information is provided on this After Visit Summary.  MyChart is used to connect with patients for Virtual Visits (Telemedicine).  Patients are able to view lab/test results, encounter notes, upcoming appointments, etc.  Non-urgent messages can be sent to your provider as well.   To learn more about what you can do with MyChart, go to [email or web address].    Your next appointment:   2 week(s) 05/01/2020 at 3:15 PM  The format for your next appointment:   In Person  Provider:   Judy Pimple, PA-C  Other Instructions

## 2020-04-16 ENCOUNTER — Encounter: Payer: Self-pay | Admitting: Medical

## 2020-04-16 ENCOUNTER — Other Ambulatory Visit: Payer: Self-pay | Admitting: *Deleted

## 2020-04-16 NOTE — Patient Outreach (Signed)
[facility]) Care Management  04/16/2020  Maria Harper Mar 06, 1971 [id number]   Subjective: Received email update from Maria Harper Financial Navigator at [facility], states Maria Harper' account will be referred to [facility] regarding Medicaid and will notify this RNCM, when additional update received.  Telephone call to patient's home  / mobile number, no answer, left HIPAA compliant voicemail message, and requested call back.    Objective:Per KPN (Knowledge Performance Now, point of care tool) and chart review,patient hospitalized 03/22/2020 -03/24/2020 for Acute CVA. Patient also has a history of CAD, cocaine abuse, tobacco abuse, hypertension, hyperlipidemia, chronic combined systolic diastolic congestive heart failure.     Assessment: Received Self Pay EMMI Stroke referral on 03/31/2020, no red flag alert present. Referral source: patient called into [facility] Care Management office, left message with Care Management Assistant requesting call and no needs specified. Received the following in- basket [place] Link / Epic message: Maria Harper (I sent you email about this also with Emmi calls information that went out to this patient) Patient called stating she received calls from us-EMMI Stroke calls. She was in the hospital until 8/23. She did not answer when these calls were made so EMMI questions are not done.  Patient did want to have a nurse call her but when I asked if she had specific questions or concerns she said no, just trying to figure out how this works. Patient states at the hospital she was told that she was been signed up for a program but is not sure what program.  Per patient she does not have a PCP. She is aware that she needs to call neurologist to set up an appointment and has that information.  Her CallBackis [phone].  Thank you  [name]  [facility] EMMI follow up pending patient contact.     Plan:RNCMwill call patient for  2nd telephone outreach attempt within 4  business days, EMMI follow up, andwill proceed with case closure, after 4th unsuccessful outreach call.     Barack Nicodemus H. Gardiner Barefoot, BSN, CCM [facility] Care Management [facility] Telephonic CM Phone: [phone] Fax: [phone]

## 2020-04-21 ENCOUNTER — Ambulatory Visit: Payer: Self-pay

## 2020-04-21 ENCOUNTER — Other Ambulatory Visit: Payer: Self-pay | Admitting: *Deleted

## 2020-04-21 NOTE — Patient Outreach (Signed)
[facility]) Care Management  04/21/2020  Maria Harper 1971/07/21 [id number]   Subjective: Telephone call to patient's home  / mobile number, no answer, message states unavailable, voicemail is full, and unable to leave a message.     Objective:Per KPN (Knowledge Performance Now, point of care tool) and chart review,patient hospitalized 03/22/2020 -03/24/2020 for Acute CVA. Patient also has a history of CAD, cocaine abuse, tobacco abuse, hypertension, hyperlipidemia, chronic combined systolic diastolic congestive heart failure.     Assessment: Received Self Pay EMMI Stroke referral on 03/31/2020, no red flag alert present. Referral source: patient called into [facility] Care Management office, left message with Care Management Assistant requesting call and no needs specified. Received the following in- basket [place] Link / Epic message: [name] (I sent you email about this also with Emmi calls information that went out to this patient) Patient called stating she received calls from us-EMMI Stroke calls. She was in the hospital until 8/23. She did not answer when these calls were made so EMMI questions are not done.  Patient did want to have a nurse call her but when I asked if she had specific questions or concerns she said no, just trying to figure out how this works. Patient states at the hospital she was told that she was been signed up for a program but is not sure what program.  Per patient she does not have a PCP. She is aware that she needs to call neurologist to set up an appointment and has that information.  Her CallBackis [phone].  Thank you  [name]  [facility] EMMI follow up pending patient contact. Last unsuccessful outreach letter sent on 04/02/2020.       Plan:RNCMwill call patient for 3rd telephone outreach attempt within4business days, EMMI follow up, andwill proceed with case closure, after 4th unsuccessful outreach call.         Zen Felling H. Gardiner Barefoot, BSN, CCM [facility] Care Management [facility] Telephonic CM Phone: [phone] Fax: [phone]

## 2020-04-25 ENCOUNTER — Other Ambulatory Visit: Payer: Self-pay | Admitting: *Deleted

## 2020-04-25 NOTE — Patient Outreach (Signed)
[facility]) Care Management  04/25/2020  Maria Harper 01-23-1971 [id number]   Subjective: Telephone call to patient's home  / mobile number, no answer, message states unavailable, voicemail is full, and unable to leave a message.     Objective:Per KPN (Knowledge Performance Now, point of care tool) and chart review,patient hospitalized 03/22/2020 -03/24/2020 for Acute CVA. Patient also has a history of CAD, cocaine abuse, tobacco abuse, hypertension, hyperlipidemia, chronic combined systolic diastolic congestive heart failure.     Assessment: Received Self Pay EMMI Stroke referral on 03/31/2020, no red flag alert present. Referral source: patient called into [facility] Care Management office, left message with Care Management Assistant requesting call and no needs specified. Received the following in- basket [place] Link / Epic message: Maria Harper (I sent you email about this also with Emmi calls information that went out to this patient) Patient called stating she received calls from us-EMMI Stroke calls. She was in the hospital until 8/23. She did not answer when these calls were made so EMMI questions are not done.  Patient did want to have a nurse call her but when I asked if she had specific questions or concerns she said no, just trying to figure out how this works. Patient states at the hospital she was told that she was been signed up for a program but is not sure what program.  Per patient she does not have a PCP. She is aware that she needs to call neurologist to set up an appointment and has that information.  Her CallBackis [phone].  Thank you  Maria Harper  [facility] EMMI follow up pending patient contact. Last unsuccessful outreach letter sent on 04/02/2020.       Plan:RNCMwill call patient for 4thtelephone outreach attempt within21business days, EMMI follow up, andwill proceed with case closure, after 4th unsuccessful outreach call.         Maria Harper, BSN, CCM [facility] Care Management [facility] Telephonic CM Phone: [phone] Fax: [phone]

## 2020-04-28 ENCOUNTER — Ambulatory Visit: Payer: Self-pay | Attending: Nurse Practitioner | Admitting: Nurse Practitioner

## 2020-04-30 ENCOUNTER — Encounter: Payer: Self-pay | Admitting: Medical

## 2020-04-30 NOTE — Progress Notes (Deleted)
Cardiology Office Note   Date:  04/30/2020   ID:  Maria Harper, DOB June 15, 1971, MRN [id number]  PCP:  Lavinia Sharps, NP  Cardiologist:  Bryan Lemma, MD EP: None  No chief complaint on file.     History of Present Illness: Maria Harper is a 49 y.o. female with a PMH of CAD s/p STEMI 11/2019 with previous PCI/DES LAD in 2017, chronic combined CHF/ICM, HTN, HLD, DM type 2, GERD, cocaine abuse, tobacco abuse, and recent CVA 03/2020 who presents for 2 week follow-up.   She was hospitalized 11/2019 for a STEMI where she was found to have 100% dLAD stenosis and 100% 3rd diagonal stenosis though unsuccessful balloon angioplasty due to inability to advance into distal vessel, dLAD lesion was reduced to 70% stenosis after PCI attempts. Echo that admission showed EF 45-50%, G1DD, and no significant valvular abnormalities. She was discharged home on aspirin and brilinta, in addition to atorvastatin, carvedilol, losartan, clonidine, and imdur. She was subsequently lost to follow-up.    She was recently admitted to the hospital 03/2020 for an acute CVA, felt to be 2/2 small vessel disease. Echo that admission showed improvement in EF to 55-60%, moderate concentric LVH (consider cardiac MRI to evaluate for possible infiltrative and hypertrophic cardiomyopathies), G1DD, and mild MR. Carotid dopplers did not reveal significant stenosis. She was discharged home with recommendations to continue DAPT with aspirin and brilinta.   I saw her in follow-up 04/15/20 at which time she reported having ongoing episodes of chest pressure, for which she has been taking SL nitro with relief of symptoms after 1 dose, though symptoms were not as severe as her MI. She reported missing doses of her aspirin on occasion and the importance of compliance with her DAPT was reiterated. Her BP was above goal, though she reported running out of her clonidine ~5 days ago and had only been taking carvedilol 6.25mg  once daily.  Imdur and carvedilol were increased at that time with plans to consider further ischemic evaluation pending response.    1. CAD s/p STEMI 11/2019 with PCI to dLAD with prior PCI/DES to mLAD in 2017: she does report continued episodes of chest pressure which seem to have been going on since her MI 11/2019. It is nitro responsive with relieve of symptoms after 1 SL nitro. There is some concern regarding medication compliance. She has missed doses of aspirin on occasion. Her last cath showed small vessel distal disease in the RCA and LAD otherwise no high grade stenosis. BP, LDL, and A1C remain above goal and she continues to smoke. Possible she has had some progression. Recent echo 03/2020 showed improvement in EF to 55-60% with no RWMA. EKG today is non-ischemic. Favor trial of medical management given extent of her small vessel disease - Continue aspirin and brilinta - Continue statin - Continue imdur to 60mg  and carvedilol 12.5mg  BID - Could consider further ischemic testing pending response to medical management***  2. Chronic combined CHF/ischemic cardiomyopathy: EF 45-50% at the time of her STEMI 11/2019 with improvement in EF to 55-60% 03/2020 at the time of her CVA. No volume overload complaints and she appears euvolemic - Continue carvedilol, losartan, and imdur  3. LV hypertrophy: noted to have moderate LVH on echo 03/2020 - recommended for a cardiac MR to evaluate for infiltrative vs hypertrophic cardiomyopathy - Will discuss this further at her follow-up visit***  4. HTN: BP *** today.  - Continue carvedilol, losartan, and imdur  5. HLD: LDL 90 03/2020; goal <  70. Lipid clinic appointment is scheduled for 05/07/20. - Continue atorvastatin - Await lipid clinic appt for PSK9-inhibitor consideration given LDL above goal despite max statin therapy  6. DM type 2: A1C 9.1 03/2020. Started on jardiance 10mg  daily at last visit. - Check BMET today - Continue jardiance and insulin - Encouraged  her to follow-up with her PCP given recent medication changes  7. Suspected OSA: she reports daytime somnolence, PND, and husband reports snoring and apneic episodes. She is scheduled for a sleep study within the month - Encouraged her to complete the sleep study as her symptoms sound c/f OSA  8. Tobacco abuse: still smoking 1/2 ppd. Both she and her husband smoke. Had a long conversation with them regarding health risks of continued use. Encouraged them to quit together - Continue to encourage cessation  9. Cocaine abuse: denies recent use - Continue to encourage abstinence    Past Medical History:  Diagnosis Date  . Acid reflux   . Cocaine abuse (HCC)   . Coronary artery disease    s/p STEMI 11/2019; PCI/DES to mLAD in 2017 and PCI/DES to dLAD in 11/2019  . Diabetes mellitus without complication (HCC)   . Heart attack (HCC) 04/2016  . High cholesterol   . Hypertension   . Ischemic cardiomyopathy 11/2019  . Tobacco abuse     Past Surgical History:  Procedure Laterality Date  . CORONARY/GRAFT ACUTE MI REVASCULARIZATION N/A 11/23/2019   Procedure: CORONARY/GRAFT ACUTE MI REVASCULARIZATION;  Surgeon: 11/25/2019, MD;  Location: [facility] INVASIVE CV LAB;  Service: Cardiovascular;  Laterality: N/A;  . LEFT HEART CATH AND CORONARY ANGIOGRAPHY N/A 11/23/2019   Procedure: LEFT HEART CATH AND CORONARY ANGIOGRAPHY;  Surgeon: 11/25/2019, MD;  Location: [facility] INVASIVE CV LAB;  Service: Cardiovascular;  Laterality: N/A;     Current Outpatient Medications  Medication Sig Dispense Refill  . albuterol (VENTOLIN HFA) 108 (90 Base) MCG/ACT inhaler Inhale 2 puffs into the lungs every 6 (six) hours as needed for wheezing or shortness of breath.    [facility] aspirin 81 MG chewable tablet Chew 81 mg by mouth daily.    Marland Kitchen atorvastatin (LIPITOR) 80 MG tablet Take 80 mg by mouth daily at 6 PM.     . busPIRone (BUSPAR) 15 MG tablet Take 15 mg by mouth 3 (three) times daily.    . carvedilol (COREG) 12.5 MG  tablet Take 1 tablet (12.5 mg total) by mouth 2 (two) times daily with a meal. 180 tablet 3  . cyclobenzaprine (FLEXERIL) 10 MG tablet Take 10 mg by mouth 3 (three) times daily as needed for muscle spasms.     Marland Kitchen doxepin (SINEQUAN) 50 MG capsule Take 50 mg by mouth at bedtime.    . empagliflozin (JARDIANCE) 10 MG TABS tablet Take 1 tablet (10 mg total) by mouth daily before breakfast. 90 tablet 3  . gabapentin (NEURONTIN) 300 MG capsule Take 300 mg by mouth 3 (three) times daily.    Marland Kitchen HUMULIN R 100 UNIT/ML injection Inject 17 Units into the skin 2 (two) times daily before a meal.   0  . insulin NPH-regular Human (HUMULIN 70/30) (70-30) 100 UNIT/ML injection Inject 35 Units into the skin 2 (two) times daily.     . isosorbide mononitrate (IMDUR) 60 MG 24 hr tablet Take 1 tablet (60 mg total) by mouth daily. 90 tablet 3  . losartan (COZAAR) 25 MG tablet Take 1 tablet (25 mg total) by mouth daily. 30 tablet 2  . montelukast (SINGULAIR)  10 MG tablet Take 10 mg by mouth at bedtime.    . nicotine (NICODERM CQ - DOSED IN MG/24 HOURS) 14 mg/24hr patch Place 1 patch (14 mg total) onto the skin daily. 28 patch 0  . nitroGLYCERIN (NITROSTAT) 0.4 MG SL tablet Place 1 tablet (0.4 mg total) under the tongue as needed for chest pain. 25 tablet 2  . pantoprazole (PROTONIX) 40 MG tablet Take 40 mg by mouth 2 (two) times daily.     . polyethylene glycol powder (GLYCOLAX/MIRALAX) powder Take 17 g by mouth daily as needed for moderate constipation.     . QUEtiapine (SEROQUEL) 200 MG tablet Take 200 mg by mouth daily.     . QUEtiapine (SEROQUEL) 400 MG tablet Take 400 mg by mouth at bedtime.    . ticagrelor (BRILINTA) 90 MG TABS tablet Take 1 tablet (90 mg total) by mouth 2 (two) times daily. 60 tablet 11  . WELLBUTRIN SR 100 MG 12 hr tablet Take 100 mg by mouth 2 (two) times daily.     No current facility-administered medications for this visit.    Allergies:   Bee venom, Contrast media [iodinated diagnostic  agents], and Tomato    Social History:  The patient  reports that she has been smoking cigarettes. She has a 30.00 pack-year smoking history. She has never used smokeless tobacco. She reports previous alcohol use. She reports current drug use. Drug: "Crack" cocaine.   Family History:  The patient's ***family history includes Diabetes in her father, maternal grandfather, maternal grandmother, and mother.    ROS:  Please see the history of present illness.   Otherwise, review of systems are positive for {NONE DEFAULTED:18576::"none"}.   All other systems are reviewed and negative.    PHYSICAL EXAM: VS:  There were no vitals taken for this visit. , BMI There is no height or weight on file to calculate BMI. GEN: Well nourished, well developed, in no acute distress HEENT: normal Neck: no JVD, carotid bruits, or masses Cardiac: ***RRR; no murmurs, rubs, or gallops,no edema  Respiratory:  clear to auscultation bilaterally, normal work of breathing GI: soft, nontender, nondistended, + BS MS: no deformity or atrophy Skin: warm and dry, no rash Neuro:  Strength and sensation are intact Psych: euthymic mood, full affect   EKG:  EKG {ACTION; IS/IS NAT:[id number]} ordered today. The ekg ordered today demonstrates ***   Recent Labs: 09/19/2019: B Natriuretic Peptide 58.4 03/22/2020: ALT 15; Hemoglobin 11.6; Platelets 261 03/23/2020: BUN 13; Creatinine, Ser 1.18; Potassium 4.1; Sodium 134    Lipid Panel    Component Value Date/Time   CHOL 166 03/23/2020 1237   TRIG 193 (H) 03/23/2020 1237   HDL 37 (L) 03/23/2020 1237   CHOLHDL 4.5 03/23/2020 1237   VLDL 39 03/23/2020 1237   LDLCALC 90 03/23/2020 1237      Wt Readings from Last 3 Encounters:  04/15/20 181 lb (82.1 kg)  03/24/20 192 lb 10.9 oz (87.4 kg)  02/06/20 170 lb (77.1 kg)      Other studies Reviewed: Additional studies/ records that were reviewed today include:   Left Heart Catheterization 11/23/2019:  Previously placed  Mid LAD to Dist LAD stent (unknown type) is widely patent.  Mid LAD lesion is 30% stenosed.  CULPRIT LESION: Dist LAD-1 lesion is 100% stenosed with 100% stenosed side branch in 3rd Diag.  Unsuccessful balloon angioplasty was performed using a BALLOON SAPPHIRE 2.0X12 -unable to advance into the distal vessel.  Post intervention, there is a 70% residual  stenosis followed by Dist LAD-2 lesion is 99% stenosed. Post intervention, the side branch was remained at 100% residual stenosis.  --------------------------  RPDA lesion is 80% stenosed-tapers into very small vessel..  LV end diastolic pressure is severely elevated. Therefore no the ventriculogram was performed.  There is no aortic valve stenosis.  Diagnostic Dominance: Right  Intervention    _____________  Echocardiogram 11/24/2019: Impressions: 1. Left ventricular ejection fraction, by estimation, is 45 to 50%. The  left ventricle has mildly decreased function. The left ventricle has no  regional wall motion abnormalities. Left ventricular diastolic parameters  are consistent with Grade I  diastolic dysfunction (impaired relaxation). Elevated left atrial  pressure.  2. Right ventricular systolic function is normal. The right ventricular  size is normal. There is normal pulmonary artery systolic pressure.  3. The mitral valve is normal in structure. No evidence of mitral valve  regurgitation. No evidence of mitral stenosis.  4. The aortic valve is tricuspid. Aortic valve regurgitation is moderate  to severe. Mild to moderate aortic valve sclerosis/calcification is  present, without any evidence of aortic stenosis.  5. The inferior vena cava is normal in size with greater than 50%  respiratory variability, suggesting right atrial pressure of 3 mmHg.  Echocardiogram 03/2020: 1. LVEF has improved since the prior study on 11/24/2019, from 45-50% to  55-60%. There is significant LVH predominantly in the apical  segments, a  cardiac MRI should be considered to evaluate for possible infiltrative and  hypertrophic cardiomyopathies.  2. Left ventricular ejection fraction, by estimation, is 55 to 60%. The  left ventricle has normal function. The left ventricle has no regional  wall motion abnormalities. There is moderate concentric left ventricular  hypertrophy. Left ventricular  diastolic parameters are consistent with Grade I diastolic dysfunction  (impaired relaxation). Elevated left atrial pressure.  3. Right ventricular systolic function is normal. The right ventricular  size is normal.  4. The mitral valve is normal in structure. Mild mitral valve  regurgitation. No evidence of mitral stenosis.  5. The aortic valve is tricuspid. Aortic valve regurgitation is moderate.  Mild to moderate aortic valve sclerosis/calcification is present, without  any evidence of aortic stenosis.  6. The inferior vena cava is normal in size with greater than 50%  respiratory variability, suggesting right atrial pressure of 3 mmHg.      ASSESSMENT AND PLAN:  1.  ***   Current medicines are reviewed at length with the patient today.  The patient {ACTIONS; HAS/DOES NOT HAVE:19233} concerns regarding medicines.  The following changes have been made:  {PLAN; NO CHANGE:13088:s}  Labs/ tests ordered today include: *** No orders of the defined types were placed in this encounter.    Disposition:   FU with *** in {gen number 3-25:498264} {Days to years:10300}  Signed, Beatriz Stallion, PA-C  04/30/2020 8:50 AM

## 2020-05-01 ENCOUNTER — Ambulatory Visit: Payer: Self-pay | Admitting: Medical

## 2020-05-06 ENCOUNTER — Ambulatory Visit: Payer: Self-pay

## 2020-05-06 DIAGNOSIS — R2689 Other abnormalities of gait and mobility: Secondary | ICD-10-CM | POA: Insufficient documentation

## 2020-05-06 DIAGNOSIS — R208 Other disturbances of skin sensation: Secondary | ICD-10-CM | POA: Insufficient documentation

## 2020-05-06 DIAGNOSIS — M6281 Muscle weakness (generalized): Secondary | ICD-10-CM | POA: Insufficient documentation

## 2020-05-06 DIAGNOSIS — R262 Difficulty in walking, not elsewhere classified: Secondary | ICD-10-CM | POA: Insufficient documentation

## 2020-05-07 ENCOUNTER — Ambulatory Visit: Payer: Self-pay

## 2020-05-07 NOTE — Progress Notes (Deleted)
Patient ID: Maria Harper                 DOB: Jan 22, 1971                    MRN: [id number]     HPI: Maria Harper is a 49 y.o. female patient of DR Maria Harper referred to lipid clinic by Maria Pimple PA. PMH is significant for CAD s/p STEMI, CHF, hypertension, hyperlipidemia, DM, GERD, cocaine abuse, tobacco abuse, and recent CVA.  Current Medications:   Intolerances:   LDL goal:   Diet:   Exercise:   Family History: The patient's family history includes Diabetes in her father, maternal grandfather, maternal grandmother, and mother.   Social History: The patient  reports that she has been smoking cigarettes. She has a 30.00 pack-year smoking history. She has never used smokeless tobacco. She reports previous alcohol use. She reports current drug use. Drug: "Crack" cocaine.    Labs:  Past Medical History:  Diagnosis Date  . Acid reflux   . Cocaine abuse (HCC)   . Coronary artery disease    s/p STEMI 11/2019; PCI/DES to mLAD in 2017 and PCI/DES to dLAD in 11/2019  . Diabetes mellitus without complication (HCC)   . Heart attack (HCC) 04/2016  . High cholesterol   . Hypertension   . Ischemic cardiomyopathy 11/2019  . Tobacco abuse     Current Outpatient Medications on File Prior to Visit  Medication Sig Dispense Refill  . albuterol (VENTOLIN HFA) 108 (90 Base) MCG/ACT inhaler Inhale 2 puffs into the lungs every 6 (six) hours as needed for wheezing or shortness of breath.    Marland Kitchen aspirin 81 MG chewable tablet Chew 81 mg by mouth daily.    Marland Kitchen atorvastatin (LIPITOR) 80 MG tablet Take 80 mg by mouth daily at 6 PM.     . busPIRone (BUSPAR) 15 MG tablet Take 15 mg by mouth 3 (three) times daily.    . carvedilol (COREG) 12.5 MG tablet Take 1 tablet (12.5 mg total) by mouth 2 (two) times daily with a meal. 180 tablet 3  . cyclobenzaprine (FLEXERIL) 10 MG tablet Take 10 mg by mouth 3 (three) times daily as needed for muscle spasms.     Marland Kitchen doxepin (SINEQUAN) 50 MG capsule Take 50 mg by  mouth at bedtime.    . empagliflozin (JARDIANCE) 10 MG TABS tablet Take 1 tablet (10 mg total) by mouth daily before breakfast. 90 tablet 3  . gabapentin (NEURONTIN) 300 MG capsule Take 300 mg by mouth 3 (three) times daily.    Marland Kitchen HUMULIN R 100 UNIT/ML injection Inject 17 Units into the skin 2 (two) times daily before a meal.   0  . insulin NPH-regular Human (HUMULIN 70/30) (70-30) 100 UNIT/ML injection Inject 35 Units into the skin 2 (two) times daily.     . isosorbide mononitrate (IMDUR) 60 MG 24 hr tablet Take 1 tablet (60 mg total) by mouth daily. 90 tablet 3  . losartan (COZAAR) 25 MG tablet Take 1 tablet (25 mg total) by mouth daily. 30 tablet 2  . montelukast (SINGULAIR) 10 MG tablet Take 10 mg by mouth at bedtime.    . nicotine (NICODERM CQ - DOSED IN MG/24 HOURS) 14 mg/24hr patch Place 1 patch (14 mg total) onto the skin daily. 28 patch 0  . nitroGLYCERIN (NITROSTAT) 0.4 MG SL tablet Place 1 tablet (0.4 mg total) under the tongue as needed for chest pain. 25 tablet 2  . pantoprazole (  PROTONIX) 40 MG tablet Take 40 mg by mouth 2 (two) times daily.     . polyethylene glycol powder (GLYCOLAX/MIRALAX) powder Take 17 g by mouth daily as needed for moderate constipation.     . QUEtiapine (SEROQUEL) 200 MG tablet Take 200 mg by mouth daily.     . QUEtiapine (SEROQUEL) 400 MG tablet Take 400 mg by mouth at bedtime.    . ticagrelor (BRILINTA) 90 MG TABS tablet Take 1 tablet (90 mg total) by mouth 2 (two) times daily. 60 tablet 11  . WELLBUTRIN SR 100 MG 12 hr tablet Take 100 mg by mouth 2 (two) times daily.     No current facility-administered medications on file prior to visit.    Allergies  Allergen Reactions  . Bee Venom Anaphylaxis  . Contrast Media [Iodinated Diagnostic Agents] Hives and Itching  . Tomato Itching and Rash    No problem-specific Assessment & Plan notes found for this encounter.    Emberlyn Burlison Rodriguez-Guzman PharmD, BCPS, CPP [facility] [address] [place] [id number] 05/07/2020 10:09 AM

## 2020-05-08 ENCOUNTER — Ambulatory Visit: Payer: Self-pay

## 2020-05-11 ENCOUNTER — Encounter (HOSPITAL_BASED_OUTPATIENT_CLINIC_OR_DEPARTMENT_OTHER): Payer: Self-pay | Admitting: Internal Medicine

## 2020-05-12 ENCOUNTER — Ambulatory Visit (INDEPENDENT_AMBULATORY_CARE_PROVIDER_SITE_OTHER): Payer: Self-pay | Admitting: Pharmacist

## 2020-05-12 ENCOUNTER — Other Ambulatory Visit: Payer: Self-pay | Admitting: *Deleted

## 2020-05-12 ENCOUNTER — Other Ambulatory Visit: Payer: Self-pay

## 2020-05-12 VITALS — BP 108/78 | HR 93 | Resp 16 | Ht 62.0 in | Wt 180.0 lb

## 2020-05-12 DIAGNOSIS — E785 Hyperlipidemia, unspecified: Secondary | ICD-10-CM

## 2020-05-12 NOTE — Patient Instructions (Addendum)
Your Results:             Your most recent labs Goal  Total Cholesterol 166 < 200  Triglycerides 193 < 150  HDL (happy/good cholesterol) 37 > 40  LDL (lousy/bad cholesterol 90 < 70     Medication changes: *Apply for AMGEN safetyNet foundation  Repatha SureClick 140mg  every 14 days*  *Bring form back to office for completion  Lab orders: *Will repeat fasting blood work after 4 doses of new medication*  Thank you for choosing 

## 2020-05-12 NOTE — Patient Outreach (Signed)
[facility]) Care Management  05/12/2020  Maria Harper 1970/08/24 [id number]    Subjective:  Telephone call to patient's home  / mobile number, no answer, message states unavailable, left HIPAA compliant voicemail message, and requested call back.   Objective:Per KPN (Knowledge Performance Now, point of care tool) and chart review,patient hospitalized 03/22/2020 -03/24/2020 for Acute CVA. Patient also has a history of CAD, cocaine abuse, tobacco abuse, hypertension, hyperlipidemia, chronic combined systolic diastolic congestive heart failure.     Assessment: Received Self Pay EMMI Stroke referral on 03/31/2020, no red flag alert present. Referral source: patient called into [facility] Care Management office, left message with Care Management Assistant requesting call and no needs specified. Received the following in- basket [place] Link / Epic message: [name] (I sent you email about this also with Emmi calls information that went out to this patient) Patient called stating she received calls from us-EMMI Stroke calls. She was in the hospital until 8/23. She did not answer when these calls were made so EMMI questions are not done.  Patient did want to have a nurse call her but when I asked if she had specific questions or concerns she said no, just trying to figure out how this works. Patient states at the hospital she was told that she was been signed up for a program but is not sure what program.  Per patient she does not have a PCP. She is aware that she needs to call neurologist to set up an appointment and has that information.  Her CallBackis [phone].  Thank you  Maria Harper  Last unsuccessful outreach letter sent on 04/02/2020. EMMI follow up not completed due to unable to contact patient and will proceed with case closure.       Plan:Case closure due to unable to reach.  RNCM will send MD case closure letter.       Maria Harper H.  Gardiner Barefoot, BSN, CCM [facility] Care Management [facility] Telephonic CM Phone: [phone] Fax: [phone]

## 2020-05-12 NOTE — Progress Notes (Signed)
Patient ID: Miho Monda                 DOB: January 26, 1971                    MRN: [id number]     HPI: Wynona Duhamel is a 49 y.o. female patient of Dr Herbie Baltimore referred to lipid clinic by Judy Pimple PA. PMH is significant for CAD s/p STEMI and PCI, HF, hypertension, hyperlipidemia, DM, GERD, cocaine abuse, tobacco abuse, and recent CVA on 03/2020. Patient presents today accompany by spouse for potential PCSK9i initiation. Noted LDL remains above goal of 70mg /dL while on atorvastatin 80mg  daily.   Current Medications:  Atorvastatin 80mg  daily (at bedtime)  Intolerances: none   LDL goal: < 70mg /dL ( desired for very high risk patient)  Diet: eating less than  breakfast : 2 eggs, cheese, toasts?,3 sausages likes / bacon lunch: subs sandwiches, chips sometimes, pickers, onions, meat, cheese, mayo Supper: chicken wings, fries, corn, salads Drinks lots of water, cool-aid (cut back the amount of sugar)  Exercise: walks  - PT eval on Wednesday October 13   Family History: The patient's family history includes Diabetes in her father, maternal grandfather, maternal grandmother, and mother. No   Social History:  Cut from a pack per day to ~1/2 pack per day. The patient  reports that she has been smoking cigarettes. She has a 30.00 pack-year smoking history. She has never used smokeless tobacco. She reports previous alcohol use. She reports current drug use. Drug: "Crack" cocaine.  Labs: CHO 166, TG 193, HDL 37, LDL-c 90 (on atorvastatin 80mg  daily)  Past Medical History:  Diagnosis Date  . Acid reflux   . Cocaine abuse (HCC)   . Coronary artery disease    s/p STEMI 11/2019; PCI/DES to mLAD in 2017 and PCI/DES to dLAD in 11/2019  . Diabetes mellitus without complication (HCC)   . Heart attack (HCC) 04/2016  . High cholesterol   . Hypertension   . Ischemic cardiomyopathy 11/2019  . Tobacco abuse     Current Outpatient Medications on File Prior to Visit  Medication Sig  Dispense Refill  . albuterol (VENTOLIN HFA) 108 (90 Base) MCG/ACT inhaler Inhale 2 puffs into the lungs every 6 (six) hours as needed for wheezing or shortness of breath.    2018 aspirin 81 MG chewable tablet Chew 81 mg by mouth daily.    12/2019 atorvastatin (LIPITOR) 80 MG tablet Take 80 mg by mouth daily at 6 PM.     . busPIRone (BUSPAR) 15 MG tablet Take 15 mg by mouth 3 (three) times daily.    . carvedilol (COREG) 12.5 MG tablet Take 1 tablet (12.5 mg total) by mouth 2 (two) times daily with a meal. 180 tablet 3  . cyclobenzaprine (FLEXERIL) 10 MG tablet Take 10 mg by mouth 3 (three) times daily as needed for muscle spasms.     . empagliflozin (JARDIANCE) 10 MG TABS tablet Take 1 tablet (10 mg total) by mouth daily before breakfast. 90 tablet 3  . gabapentin (NEURONTIN) 300 MG capsule Take 300 mg by mouth 3 (three) times daily.    05/2016 HUMULIN R 100 UNIT/ML injection Inject 17 Units into the skin 2 (two) times daily before a meal.   0  . insulin NPH-regular Human (HUMULIN 70/30) (70-30) 100 UNIT/ML injection Inject 35 Units into the skin 2 (two) times daily.     . isosorbide mononitrate (IMDUR) 60 MG 24 hr tablet Take 1 tablet (60  mg total) by mouth daily. 90 tablet 3  . loratadine (CLARITIN) 10 MG tablet Take 10 mg by mouth daily.    Marland Kitchen losartan (COZAAR) 25 MG tablet Take 1 tablet (25 mg total) by mouth daily. 30 tablet 2  . nitroGLYCERIN (NITROSTAT) 0.4 MG SL tablet Place 1 tablet (0.4 mg total) under the tongue as needed for chest pain. 25 tablet 2  . pantoprazole (PROTONIX) 40 MG tablet Take 40 mg by mouth 2 (two) times daily.     . polyethylene glycol powder (GLYCOLAX/MIRALAX) powder Take 17 g by mouth daily as needed for moderate constipation.     . QUEtiapine (SEROQUEL) 200 MG tablet Take 200 mg by mouth daily.     . QUEtiapine (SEROQUEL) 400 MG tablet Take 400 mg by mouth at bedtime.    . ticagrelor (BRILINTA) 90 MG TABS tablet Take 1 tablet (90 mg total) by mouth 2 (two) times daily. 60 tablet 11   . WELLBUTRIN SR 100 MG 12 hr tablet Take 100 mg by mouth 2 (two) times daily.     No current facility-administered medications on file prior to visit.    Allergies  Allergen Reactions  . Bee Venom Anaphylaxis  . Contrast Media [Iodinated Diagnostic Agents] Hives and Itching  . Tomato Itching and Rash    Hyperlipidemia with target LDL less than 70 LDL remains above goal for secondary prevention. She is currently on atorvastatin 80mg  daily and she reports compliance with medication. Noted her diet is rich in fatty foods and preserve meats, cheese, sausage links, bacon, chips, mayo, chicken wings, fries, and cool-aid.  She decreased amount of cigarettes usage from 1 pack per day , down to 1/2 pack per day. Blood pressure is well controlled and no ADR noted. Patient is NOT insured either.  We discussed indication, storage, administration, and common side effects for Repatha/Praluent. Will submit paperwork for [facility] foundation (form given to patient during OV). Plan to start Repatha SureClick 140mg  every 14 days, if approved. Will repeat fasting blood work after 4 dose of new medication.  Timoteo Carreiro Rodriguez-Guzman PharmD, BCPS, CPP [facility] [address] [place] [address] 05/13/2020 1:41 PM

## 2020-05-13 NOTE — Assessment & Plan Note (Addendum)
LDL remains above goal for secondary prevention. She is currently on atorvastatin 80mg  daily and she reports compliance with medication. Noted her diet is rich in fatty foods and preserve meats, cheese, sausage links, bacon, chips, mayo, chicken wings, fries, and cool-aid.  She decreased amount of cigarettes usage from 1 pack per day , down to 1/2 pack per day. Blood pressure is well controlled and no ADR noted. Patient is NOT insured either.  We discussed indication, storage, administration, and common side effects for Repatha/Praluent. Will submit paperwork for [facility] foundation (form given to patient during OV). Plan to start Repatha SureClick 140mg  every 14 days, if approved. Will repeat fasting blood work after 4 dose of new medication.

## 2020-05-14 ENCOUNTER — Other Ambulatory Visit: Payer: Self-pay

## 2020-05-14 ENCOUNTER — Ambulatory Visit: Payer: Self-pay | Admitting: Physical Therapy

## 2020-05-14 ENCOUNTER — Encounter: Payer: Self-pay | Admitting: Physical Therapy

## 2020-05-14 DIAGNOSIS — R262 Difficulty in walking, not elsewhere classified: Secondary | ICD-10-CM

## 2020-05-14 DIAGNOSIS — R2689 Other abnormalities of gait and mobility: Secondary | ICD-10-CM

## 2020-05-14 DIAGNOSIS — M6281 Muscle weakness (generalized): Secondary | ICD-10-CM

## 2020-05-14 DIAGNOSIS — R208 Other disturbances of skin sensation: Secondary | ICD-10-CM

## 2020-05-14 NOTE — Therapy (Signed)
[facility] [address] [place], [place], [address] Phone: [phone]   Fax:  [phone]  Physical Therapy Evaluation  Patient Details  Name: Maria Harper MRN: [id number] Date of Birth: [birth year] Referring Provider (PT): Narda Bonds, MD   Encounter Date: 05/14/2020   PT End of Session - 05/14/20 1208    Visit Number 1    Number of Visits 11    Date for PT Re-Evaluation 06/25/20    Authorization Type Self-pay    PT Start Time 1022    PT Stop Time 1105    PT Time Calculation (min) 43 min    Activity Tolerance Patient tolerated treatment well    Behavior During Therapy [facility] for tasks assessed/performed           Past Medical History:  Diagnosis Date  . Acid reflux   . Cocaine abuse (HCC)   . Coronary artery disease    s/p STEMI 11/2019; PCI/DES to mLAD in 2017 and PCI/DES to dLAD in 11/2019  . Diabetes mellitus without complication (HCC)   . Heart attack (HCC) 04/2016  . High cholesterol   . Hypertension   . Ischemic cardiomyopathy 11/2019  . Tobacco abuse     Past Surgical History:  Procedure Laterality Date  . CORONARY/GRAFT ACUTE MI REVASCULARIZATION N/A 11/23/2019   Procedure: CORONARY/GRAFT ACUTE MI REVASCULARIZATION;  Surgeon: [name], MD;  Location: [facility] INVASIVE CV LAB;  Service: Cardiovascular;  Laterality: N/A;  . LEFT HEART CATH AND CORONARY ANGIOGRAPHY N/A 11/23/2019   Procedure: LEFT HEART CATH AND CORONARY ANGIOGRAPHY;  Surgeon: [name], MD;  Location: [facility] INVASIVE CV LAB;  Service: Cardiovascular;  Laterality: N/A;    There were no vitals filed for this visit.    Subjective Assessment - 05/14/20 1026    Subjective Pt reports recent stroke affecting her right side. Pt reports CVA almost a month ago (per pt's chart she was admitted 8/22 for R basal ganglia infarct but symptoms started 8/19). Pt has difficulty holding things in her hand due to numbness. Numbness extends  to her face/lips but notes no issues with feeding/eating. Pt states that the numbness stays in her leg. She is unable to walk long periods of time. Pt gets assist from husband for cooking, walking, sometimes transfers. She is not using any a/d. Pt reports she did not get any therapy in the hospital. She states she was hospitalized for 2 days. Husband reports that he feels symptoms worsened after the hospital. Husband notes that they don't have transportation so have to use the bus.    Patient is accompained by: Family member   husband, Will   Pertinent History CAD status post PCI, chronic combined systolic and diastolic CHF (EF 45 to 50% on echo done April 2021), hypertension, hyperlipidemia, insulin-dependent type 2 diabetes, cocaine abuse, tobacco use, GERD    Limitations Standing    How long can you sit comfortably? n/a    How long can you stand comfortably? 3-5 minutes (limited due to pain)    How long can you walk comfortably? 3-5 minutes with constant stops (limited due to pain)    Diagnostic tests MRI 8/22: 1. Small focus of acute/subacute ischemia within the left thalamus.No hemorrhage or mass effect.2. Old bilateral basal ganglia small vessel infarcts.3. Normal cervical spinal cord.4. Mild cervical degenerative disc disease without spinal canal orneural foraminal stenosis.    Patient Stated Goals Improve sensation on right side, improve ambulation and general mobility  Currently in Pain? Yes    Pain Score 10-Worst pain ever   10/10 at worst; She reports in the beginning in the morning and then late at night   Pain Location Other (Comment)   Whole right side   Pain Orientation Right    Pain Descriptors / Indicators Tingling;Sharp;Numbness;Other (Comment)   "Like it's sticking me"   Pain Type Acute pain;Neuropathic pain    Pain Onset More than a month ago    Pain Frequency Constant    Aggravating Factors  always aggravated    Pain Relieving Factors When husband moves her right leg     Effect of Pain on Daily Activities All ADLs due to constant discomfort from her nerve pain/numbness              [facility] PT Assessment - 05/14/20 0001      Assessment   Medical Diagnosis I63.9 (ICD-10-CM) - Thalamic stroke [facility])    Referring Provider (PT) Narda Bonds, MD    Onset Date/Surgical Date 03/20/20    Hand Dominance Right    Prior Therapy None      Precautions   Precautions None      Restrictions   Weight Bearing Restrictions No      Balance Screen   Has the patient fallen in the past 6 months No    Has the patient had a decrease in activity level because of a fear of falling?  Yes    Is the patient reluctant to leave their home because of a fear of falling?  No      Home Tourist information centre manager residence    Living Arrangements Spouse/significant other    Available Help at Discharge Family    Type of Home Apartment    Home Access Level entry    Home Layout One level      Prior Function   Level of Independence Independent    Vocation Unemployed    Leisure Watch TV      Cognition   Overall Cognitive Status Impaired/Different from baseline   per husband report   Area of Impairment Attention    Current Attention Level Selective   per husband report   Attention Comments Reports decreased focus in conversation    Attention Alternating   per husband report   Selective Attention --    Memory Impaired    Memory Impairment Decreased recall of new information;Storage deficit   "can't remember appointments or what doctor said"   Awareness Appears intact    Problem Solving Appears intact    Executive Function Organizing   per husband report     Observation/Other Assessments   Focus on Therapeutic Outcomes (FOTO)  81% limitation      Sensation   Light Touch Impaired by gross assessment;Impaired Detail    Hot/Cold --   Uncomfortable   Proprioception Impaired Detail    Additional Comments Pt unable to determine light touch along lateral R foot  to heel; highly sensitive to touch along medial/plantar foot; reports increased numbness in finger tips and toes   Pt highly reactive with gentle palpation of medial foot/toes     Coordination   Gross Motor Movements are Fluid and Coordinated No    Heel Shin Test Deecreased coordination on R      Tone   Assessment Location Right Lower Extremity      Strength   Overall Strength Comments R grip 3+/5 vs L grip 5/5    Right/Left Hip Right;Left  Right Hip Flexion 4/5    Left Hip Flexion 3+/5    Right/Left Knee Right;Left    Right Knee Flexion 5/5    Right Knee Extension 5/5    Left Knee Flexion 4+/5    Left Knee Extension 4+/5    Right/Left Ankle Right;Left    Right Ankle Dorsiflexion 3+/5    Right Ankle Plantar Flexion 4/5    Left Ankle Dorsiflexion 4/5    Left Ankle Plantar Flexion 5/5      Ambulation/Gait   Ambulation Distance (Feet) 60 Feet    Gait Pattern Step-through pattern;Decreased weight shift to right;Decreased dorsiflexion - right;Decreased stance time - right;Right foot flat;Wide base of support    Ambulation Surface Level;Indoor    Gait velocity 19.1 sec = 1.71 feet/sec    Gait Comments Limited due to fatigue      Standardized Balance Assessment   Five times sit to stand comments  1'29"   Multiple seated rest breaks taken     Timed Up and Go Test   Normal TUG (seconds) 22.9      RLE Tone   RLE Tone Hypotonic;Modified Ashworth      RLE Tone   Modified Ashworth Scale for Grading Hypertonia RLE Slight increase in muscle tone, manifested by a catch and release or by minimal resistance at the end of the range of motion when the affected part(s) is moved in flexion or extension   R ankle                     Objective measurements completed on examination: See above findings.               PT Education - 05/14/20 1224    Education Details Discussed neuropathic pain and desensitization with wash rag/pillow case, discussed POC and exam  findings    Person(s) Educated Patient;Spouse    Methods Explanation;Demonstration;Verbal cues    Comprehension Verbalized understanding;Verbal cues required            PT Short Term Goals - 05/14/20 1238      PT SHORT TERM GOAL #1   Title Pt will be able to perform 2 MWT to obtain objective measure for her endurance    Baseline Unable on eval    Time 2    Period Weeks    Status New    Target Date 05/28/20      PT SHORT TERM GOAL #2   Title Pt and family will be independent with performing desensitization techniques for R side    Time 2    Status New    Target Date 05/28/20             PT Long Term Goals - 05/14/20 1238      PT LONG TERM GOAL #1   Title Pt will be independent with advanced HEP to improve strength and endurance    Time 6    Period Weeks    Status New    Target Date 06/25/20      PT LONG TERM GOAL #2   Title Pt will improve FOTO score to </=50% impairment    Baseline 81% impaired (05/14/20)    Time 6    Period Weeks    Status New    Target Date 06/25/20      PT LONG TERM GOAL #3   Title Pt will have improved 5x STS to </=45 sec to demonstrate improved functional strength and activity tolerance    Baseline 1  min and 29 sec (05/14/20)    Time 6    Period Weeks    Status New    Target Date 06/25/20      PT LONG TERM GOAL #4   Title Pt will improve gait speed to at least 2 feet/sec to demo limited community ambulation    Baseline 1.71 feet/sec (05/14/20)    Time 6    Period Weeks    Status New    Target Date 06/25/20      PT LONG TERM GOAL #5   Title Pt will have improved TUG score to at least 15 sec for reduced fall risk    Baseline 22.9 sec (05/14/20)    Time 6    Period Weeks    Status New    Target Date 06/25/20                  Plan - 05/14/20 1225    Clinical Impression Statement Maria Harper presenting to OPPT s/p L thalamic ischemic CVA around 03/23/20. Pt demos impaired sensations with neuropathic  pain/sensitivities and numbness along R LE, decreased R LE coordination and proprioception, decrease R LE strength, with overall reduced activity tolerance and endurance. Pt required multiple rest breaks due to fatigue. Pt's gait speed, TUG, and 5x STS place her at high risk of falls. Pt would benefit from therapy to optimize her level of function for home as she remains limited in her ability to perform ADLs and IADLs. She would additionally benefit from OT Harper/u.    Personal Factors and Comorbidities Age;Comorbidity 1;Comorbidity 2;Comorbidity 3+;Time since onset of injury/illness/exacerbation    Comorbidities CAD status post PCI, chronic combined systolic and diastolic CHF (EF 45 to 50% on echo done April 2021), hypertension, hyperlipidemia, insulin-dependent type 2 diabetes, cocaine abuse, tobacco use    Examination-Activity Limitations Bathing;Bed Mobility;Bend;Caring for Others;Carry;Dressing;Hygiene/Grooming;Locomotion Level;Transfers;Self Feeding;Sleep;Squat;Stand;Stairs;Toileting    Examination-Participation Restrictions Cleaning;Community Activity;Meal Prep;Laundry;Yard Work    Conservation officer, historic buildings Evolving/Moderate complexity    Clinical Decision Making Moderate    Rehab Potential Fair    PT Frequency Other (comment)   2x/wk for 3-4 weeks and then transition to 1x/wk for a total of 6 weeks   PT Duration 6 weeks    PT Treatment/Interventions ADLs/Self Care Home Management;Aquatic Therapy;Cryotherapy;Electrical Stimulation;Iontophoresis 4mg /ml Dexamethasone;Moist Heat;Ultrasound;DME Instruction;Gait training;Stair training;Functional mobility training;Therapeutic activities;Therapeutic exercise;Balance training;Neuromuscular re-education;Manual techniques;Patient/family education;Passive range of motion;Energy conservation;Taping    PT Next Visit Plan How has desensitization with wash rag/pillow case been? Initiate gentle stretching and strengthening (consider Nustep or Scifit).  Perform 2 minute walk test (not sure if pt is able to tolerate ).    PT Home Exercise Plan Desensitization at least 5 minutes 3x or more per day.    Recommended Other Services Occupational therapy -- pt with difficulty gripping and performing ADLs; husband reports decreased attention/memory from baseline    Consulted and Agree with Plan of Care Patient;Family member/caregiver    Family Member Consulted Husband, Will           Patient will benefit from skilled therapeutic intervention in order to improve the following deficits and impairments:  Decreased coordination, Decreased range of motion, Difficulty walking, Impaired UE functional use, Decreased endurance, Decreased activity tolerance, Pain, Decreased balance, Decreased mobility, Decreased strength, Impaired sensation  Visit Diagnosis: Other disturbances of skin sensation  Difficulty in walking, not elsewhere classified  Muscle weakness (generalized)  Other abnormalities of gait and mobility     Problem List Patient Active Problem  List   Diagnosis Date Noted  . Acute CVA (cerebrovascular accident) (HCC) 03/23/2020  . Hyperglycemia due to type 2 diabetes mellitus (HCC) 03/23/2020  . Hypertension 11/28/2019  . UTI (urinary tract infection) 11/28/2019  . Hyperlipidemia with target LDL less than 70   . Tobacco abuse   . Type II diabetes mellitus with neurological manifestations, uncontrolled (HCC) 11/25/2019  . Acute ST elevation myocardial infarction (STEMI) of anterior wall (HCC) 11/23/2019  . CAD S/P percutaneous coronary angioplasty 11/23/2019  . Acute ST elevation myocardial infarction (STEMI) of anteroseptal wall (HCC) 11/23/2019  . Sepsis (HCC) 09/19/2019  . Cocaine abuse with cocaine-induced mood disorder [facility]) 06/10/2017    Maria Harper April Ma L [place] PT, DPT 05/14/2020, 12:50 PM  [facility] [address] [place], [place], [address] Phone:  [phone]   Fax:  [phone]  Name: Maria Harper MRN: [id number] Date of Birth: 10-Aug-1970

## 2020-05-16 NOTE — Addendum Note (Signed)
Addended by: Jules Husbands MARIE L on: 05/16/2020 03:04 PM   Modules accepted: Orders

## 2020-05-19 ENCOUNTER — Ambulatory Visit: Payer: Self-pay | Admitting: Physical Therapy

## 2020-05-21 ENCOUNTER — Ambulatory Visit: Payer: Self-pay | Admitting: Physical Therapy

## 2020-05-21 ENCOUNTER — Encounter: Payer: Self-pay | Admitting: Physical Therapy

## 2020-05-21 ENCOUNTER — Other Ambulatory Visit: Payer: Self-pay

## 2020-05-21 DIAGNOSIS — R262 Difficulty in walking, not elsewhere classified: Secondary | ICD-10-CM

## 2020-05-21 DIAGNOSIS — M6281 Muscle weakness (generalized): Secondary | ICD-10-CM

## 2020-05-21 DIAGNOSIS — R2689 Other abnormalities of gait and mobility: Secondary | ICD-10-CM

## 2020-05-21 DIAGNOSIS — R208 Other disturbances of skin sensation: Secondary | ICD-10-CM

## 2020-05-21 NOTE — Patient Instructions (Signed)
Access Code: [email or web address] URL: [email or web address] Date: 05/21/2020 Prepared by: Vernon Prey April Kirstie Peri  Exercises Seated Toe Towel Scrunches - 1 x daily - 7 x weekly - 2 sets - 10 reps Ankle Inversion Eversion Towel Slide - 1 x daily - 7 x weekly - 2 sets - 10 reps Heel Toe Raises with Counter Support - 1 x daily - 7 x weekly - 2 sets - 10 reps Standing Gastroc Stretch - 1 x daily - 7 x weekly - 20 sec hold Soleus Stretch on Wall - 1 x daily - 7 x weekly - 20 sec hold

## 2020-05-21 NOTE — Therapy (Signed)
[facility] [address] [place], [place], [address] Phone: [phone]   Fax:  [phone]  Physical Therapy Treatment  Patient Details  Name: Maria Harper MRN: [id number] Date of Birth: [birth year] Referring Provider (PT): Narda Bonds, MD   Encounter Date: 05/21/2020   PT End of Session - 05/21/20 1204    Visit Number 2    Number of Visits 11    Date for PT Re-Evaluation 06/25/20    Authorization Type Self-pay    PT Start Time 1055    PT Stop Time 1145    PT Time Calculation (min) 50 min    Activity Tolerance Patient tolerated treatment well;Patient limited by fatigue    Behavior During Therapy [facility] for tasks assessed/performed           Past Medical History:  Diagnosis Date   Acid reflux    Cocaine abuse (HCC)    Coronary artery disease    s/p STEMI 11/2019; PCI/DES to mLAD in 2017 and PCI/DES to dLAD in 11/2019   Diabetes mellitus without complication (HCC)    Heart attack (HCC) 04/2016   High cholesterol    Hypertension    Ischemic cardiomyopathy 11/2019   Tobacco abuse     Past Surgical History:  Procedure Laterality Date   CORONARY/GRAFT ACUTE MI REVASCULARIZATION N/A 11/23/2019   Procedure: CORONARY/GRAFT ACUTE MI REVASCULARIZATION;  Surgeon: [name], MD;  Location: [facility] INVASIVE CV LAB;  Service: Cardiovascular;  Laterality: N/A;   LEFT HEART CATH AND CORONARY ANGIOGRAPHY N/A 11/23/2019   Procedure: LEFT HEART CATH AND CORONARY ANGIOGRAPHY;  Surgeon: [name], MD;  Location: [facility] INVASIVE CV LAB;  Service: Cardiovascular;  Laterality: N/A;    There were no vitals filed for this visit.   Subjective Assessment - 05/21/20 1056    Subjective Pt states that the desensitization has helped. Pt reports toes and finger tips feel more painful because of numbness. Pt notes that the rest of her right side seems to have decreased in how much it is bothering her. Pt notes that  she's been sleeping with her foot in a pillow case and this seems to have helped as well.    Patient is accompained by: Family member   Husband   Pertinent History CAD status post PCI, chronic combined systolic and diastolic CHF (EF 45 to 50% on echo done April 2021), hypertension, hyperlipidemia, insulin-dependent type 2 diabetes, cocaine abuse, tobacco use, GERD    Limitations Standing    How long can you sit comfortably? n/a    How long can you stand comfortably? 3-5 minutes (limited due to pain)    How long can you walk comfortably? 3-5 minutes with constant stops (limited due to pain)    Diagnostic tests MRI 8/22: 1. Small focus of acute/subacute ischemia within the left thalamus.No hemorrhage or mass effect.2. Old bilateral basal ganglia small vessel infarcts.3. Normal cervical spinal cord.4. Mild cervical degenerative disc disease without spinal canal orneural foraminal stenosis.    Patient Stated Goals Improve sensation on right side, improve ambulation and general mobility    Currently in Pain? Yes    Pain Score 10-Worst pain ever                Self Care/Home management:  Provided caregiver demo for desensitization; caregiver able to provide demo back  5 min R foot desensitization with pillow case  5 min R foot desensitization with wash rag  5 min R foot desensitization with vestibular  vibrator  Therex:  Heel raise x10 with eyes open at counter  Heel raise x10 with eyes closed at counter  Toe raise x10 with eyes open at counter  Toe raise x10 with eyes closed at counter  Gastroc stretch x30 sec  Soleus stretch x30 sec  NMR:  Rockerboard anterior/posterior 2x10 rocks with eyes closed  Amb ~173' for 2 MWT with SBA no a/d; cues for heel strike, toe off and narrower BOS                      PT Education - 05/21/20 1150    Education Details Discussed importance of keeping R foot moving even if it is feeling numb; reinforced desensitization and starting  at least sensitive spots working towards most sensitive spot    Person(s) Educated Patient;Spouse    Methods Explanation;Demonstration;Verbal cues    Comprehension Verbalized understanding;Returned demonstration;Verbal cues required            PT Short Term Goals - 05/21/20 1201      PT SHORT TERM GOAL #1   Title Pt will increase 2 MWT to at least 227' to demonstrate minimal detectable change for improved endurance    Baseline 173' on 05/21/20    Time 4    Period Weeks    Status Revised    Target Date 06/18/20      PT SHORT TERM GOAL #2   Title Pt and family will be independent with performing desensitization techniques for R side    Time 2    Status On-going    Target Date 05/28/20             PT Long Term Goals - 05/14/20 1238      PT LONG TERM GOAL #1   Title Pt will be independent with advanced HEP to improve strength and endurance    Time 6    Period Weeks    Status New    Target Date 06/25/20      PT LONG TERM GOAL #2   Title Pt will improve FOTO score to </=50% impairment    Baseline 81% impaired (05/14/20)    Time 6    Period Weeks    Status New    Target Date 06/25/20      PT LONG TERM GOAL #3   Title Pt will have improved 5x STS to </=45 sec to demonstrate improved functional strength and activity tolerance    Baseline 1 min and 29 sec (05/14/20)    Time 6    Period Weeks    Status New    Target Date 06/25/20      PT LONG TERM GOAL #4   Title Pt will improve gait speed to at least 2 feet/sec to demo limited community ambulation    Baseline 1.71 feet/sec (05/14/20)    Time 6    Period Weeks    Status New    Target Date 06/25/20      PT LONG TERM GOAL #5   Title Pt will have improved TUG score to at least 15 sec for reduced fall risk    Baseline 22.9 sec (05/14/20)    Time 6    Period Weeks    Status New    Target Date 06/25/20                 Plan - 05/21/20 1151    Clinical Impression Statement Re-inforced desensitization  techniques with pt's husband and performed in clinic on  pt's foot utilizing pillow case (pt tolerated well), wash rag (difficulty tolerating along lateral distal plantar phalanges 4-5), and vestibular vibrator (least tolerable along plantar foot and all distal phalanges). Initiated ankle/foot strengthening, stretching, and gait training. Obtained 2 MWT measurement.    Personal Factors and Comorbidities Age;Comorbidity 1;Comorbidity 2;Comorbidity 3+;Time since onset of injury/illness/exacerbation    Comorbidities CAD status post PCI, chronic combined systolic and diastolic CHF (EF 45 to 50% on echo done April 2021), hypertension, hyperlipidemia, insulin-dependent type 2 diabetes, cocaine abuse, tobacco use    Examination-Activity Limitations Bathing;Bed Mobility;Bend;Caring for Others;Carry;Dressing;Hygiene/Grooming;Locomotion Level;Transfers;Self Feeding;Sleep;Squat;Stand;Stairs;Toileting    Examination-Participation Restrictions Cleaning;Community Activity;Meal Prep;Laundry;Yard Work    Conservation officer, historic buildings Evolving/Moderate complexity    Rehab Potential Fair    PT Frequency Other (comment)   2x/wk for 3-4 weeks and then transition to 1x/wk for a total of 6 weeks   PT Duration 6 weeks    PT Treatment/Interventions ADLs/Self Care Home Management;Aquatic Therapy;Cryotherapy;Electrical Stimulation;Iontophoresis 4mg /ml Dexamethasone;Moist Heat;Ultrasound;DME Instruction;Gait training;Stair training;Functional mobility training;Therapeutic activities;Therapeutic exercise;Balance training;Neuromuscular re-education;Manual techniques;Patient/family education;Passive range of motion;Energy conservation;Taping    PT Next Visit Plan How has desensitization been & what new textures have you been using at home? Initiate gentle stretching and strengthening. Please initiate walking program/endurance training. Consider balance on foam.    PT Home Exercise Plan Access Code: [email or web address] + desenitization 5 min  3x/more per day    Consulted and Agree with Plan of Care Patient;Family member/caregiver   Husband   Family Member Consulted Husband, Will           Patient will benefit from skilled therapeutic intervention in order to improve the following deficits and impairments:  Decreased coordination, Decreased range of motion, Difficulty walking, Impaired UE functional use, Decreased endurance, Decreased activity tolerance, Pain, Decreased balance, Decreased mobility, Decreased strength, Impaired sensation  Visit Diagnosis: Other disturbances of skin sensation  Difficulty in walking, not elsewhere classified  Muscle weakness (generalized)  Other abnormalities of gait and mobility     Problem List Patient Active Problem List   Diagnosis Date Noted   Acute CVA (cerebrovascular accident) (HCC) 03/23/2020   Hyperglycemia due to type 2 diabetes mellitus (HCC) 03/23/2020   Hypertension 11/28/2019   UTI (urinary tract infection) 11/28/2019   Hyperlipidemia with target LDL less than 70    Tobacco abuse    Type II diabetes mellitus with neurological manifestations, uncontrolled (HCC) 11/25/2019   Acute ST elevation myocardial infarction (STEMI) of anterior wall (HCC) 11/23/2019   CAD S/P percutaneous coronary angioplasty 11/23/2019   Acute ST elevation myocardial infarction (STEMI) of anteroseptal wall (HCC) 11/23/2019   Sepsis (HCC) 09/19/2019   Cocaine abuse with cocaine-induced mood disorder [facility]) 06/10/2017    Devetta Hagenow April Ma L Sharisa Toves PT, DPT 05/21/2020, 12:07 PM  [facility] [address] [place], [place], [place] Phone: [phone]   Fax:  [phone]  Name: Maria Harper MRN: [name] Date of Birth: 03/09/1971

## 2020-05-22 ENCOUNTER — Ambulatory Visit: Payer: Self-pay | Admitting: Physical Therapy

## 2020-05-26 ENCOUNTER — Ambulatory Visit: Payer: Self-pay

## 2020-05-26 ENCOUNTER — Other Ambulatory Visit: Payer: Self-pay

## 2020-05-26 VITALS — BP 161/92 | HR 89

## 2020-05-26 DIAGNOSIS — R2689 Other abnormalities of gait and mobility: Secondary | ICD-10-CM

## 2020-05-26 DIAGNOSIS — R208 Other disturbances of skin sensation: Secondary | ICD-10-CM

## 2020-05-26 DIAGNOSIS — M6281 Muscle weakness (generalized): Secondary | ICD-10-CM

## 2020-05-26 DIAGNOSIS — R262 Difficulty in walking, not elsewhere classified: Secondary | ICD-10-CM

## 2020-05-26 NOTE — Therapy (Signed)
[facility] [address] [place], [place], [address] Phone: [phone]   Fax:  [phone]  Physical Therapy Treatment  Patient Details  Name: Maria Harper MRN: [id number] Date of Birth: [birth year] Referring Provider (PT): Narda Bonds, MD   Encounter Date: 05/26/2020   PT End of Session - 05/26/20 1021    Visit Number 3    Number of Visits 11    Date for PT Re-Evaluation 06/25/20    Authorization Type Self-pay    PT Start Time 1018    PT Stop Time 1054   pt requesting to leave early   PT Time Calculation (min) 36 min    Activity Tolerance Patient tolerated treatment well;Patient limited by fatigue    Behavior During Therapy [facility] for tasks assessed/performed           Past Medical History:  Diagnosis Date  . Acid reflux   . Cocaine abuse (HCC)   . Coronary artery disease    s/p STEMI 11/2019; PCI/DES to mLAD in 2017 and PCI/DES to dLAD in 11/2019  . Diabetes mellitus without complication (HCC)   . Heart attack (HCC) 04/2016  . High cholesterol   . Hypertension   . Ischemic cardiomyopathy 11/2019  . Tobacco abuse     Past Surgical History:  Procedure Laterality Date  . CORONARY/GRAFT ACUTE MI REVASCULARIZATION N/A 11/23/2019   Procedure: CORONARY/GRAFT ACUTE MI REVASCULARIZATION;  Surgeon: [name], MD;  Location: [facility] INVASIVE CV LAB;  Service: Cardiovascular;  Laterality: N/A;  . LEFT HEART CATH AND CORONARY ANGIOGRAPHY N/A 11/23/2019   Procedure: LEFT HEART CATH AND CORONARY ANGIOGRAPHY;  Surgeon: [name], MD;  Location: [facility] INVASIVE CV LAB;  Service: Cardiovascular;  Laterality: N/A;    Vitals:   05/26/20 1050  BP: (!) 161/92  Pulse: 89     Subjective Assessment - 05/26/20 1023    Subjective patient reports continues to complete desentization techniques. Patient no new changes/complaints. No falls. Patietn reports that she gets out of breath quickly.    Patient is accompained  by: Family member   Husband   Pertinent History CAD status post PCI, chronic combined systolic and diastolic CHF (EF 45 to 50% on echo done April 2021), hypertension, hyperlipidemia, insulin-dependent type 2 diabetes, cocaine abuse, tobacco use, GERD    Limitations Standing    How long can you sit comfortably? n/a    How long can you stand comfortably? 3-5 minutes (limited due to pain)    How long can you walk comfortably? 3-5 minutes with constant stops (limited due to pain)    Diagnostic tests MRI 8/22: 1. Small focus of acute/subacute ischemia within the left thalamus.No hemorrhage or mass effect.2. Old bilateral basal ganglia small vessel infarcts.3. Normal cervical spinal cord.4. Mild cervical degenerative disc disease without spinal canal orneural foraminal stenosis.    Patient Stated Goals Improve sensation on right side, improve ambulation and general mobility    Currently in Pain? Yes    Pain Score 8     Pain Location Leg    Pain Orientation Right    Pain Descriptors / Indicators Numbness;Tingling    Pain Type Acute pain;Neuropathic pain    Pain Onset More than a month ago    Pain Frequency Constant    Aggravating Factors  "comes on whenever"    Pain Relieving Factors warmth                [facility] Adult PT Treatment/Exercise - 05/26/20 0001  Ambulation/Gait   Ambulation/Gait Yes    Ambulation/Gait Assistance 5: Supervision    Ambulation/Gait Assistance Details completed ambulation x 115 ft around therapy gym. No SOB reports, 98% 02Sat, HR: 92. mild fatigue reported by patient.    Ambulation Distance (Feet) 115 Feet    Assistive device None    Gait Pattern Step-through pattern;Decreased weight shift to right;Decreased dorsiflexion - right;Decreased stance time - right;Right foot flat;Wide base of support    Ambulation Surface Level;Indoor      Self-Care   Self-Care Other Self-Care Comments    Other Self-Care Comments  Patient reporting that she did not take BP  medications prior to session, PT educating on taking BP medications at prescribed to allow for participation in therapy services and to avoid elevated BP. Patient verbalized understanding.                Balance Exercises - 05/26/20 0001      Balance Exercises: Standing   Standing Eyes Opened Wide (BOA);Head turns;Foam/compliant surface;Limitations    Standing Eyes Opened Limitations completed standing on airex completed horizontal/vertical head turns x 10 reps.     Standing Eyes Closed Wide (BOA);Foam/compliant surface;3 reps;Limitations;30 secs    Standing Eyes Closed Limitations increased sway noted with vision removed.     Tandem Stance Eyes open;Intermittent upper extremity support;3 reps;30 secs;Limitations    Tandem Stance Time completed 3 x 30 secs on each leg, intermittent UE support. increased balance challenge with RLE posterior.     Other Standing Exercises Comments intermittent rest breaks required between completion of all exercises due to fatigue.            Access Code: [email or web address] URL: [email or web address] Date: 05/26/2020 Prepared by: Jethro Bastos  Exercises Seated Toe Towel Scrunches - 1 x daily - 7 x weekly - 2 sets - 10 reps Ankle Inversion Eversion Towel Slide - 1 x daily - 7 x weekly - 2 sets - 10 reps Heel Toe Raises with Counter Support - 1 x daily - 7 x weekly - 2 sets - 10 reps Standing Gastroc Stretch - 1 x daily - 7 x weekly - 20 sec hold Soleus Stretch on Wall - 1 x daily - 7 x weekly - 20 sec hold Standing with Head Rotation on Pillow - 1 x daily - 5 x weekly - 2 sets - 10 reps Standing Balance with Eyes Closed on Pillow - 1 x daily - 5 x weekly - 1 sets - 3 reps - 30 hold Standing Tandem Balance with Counter Support - 1 x daily - 5 x weekly - 1 sets - 3 reps - 30 hold  All highlighted additions added to HEP on 10/25. Educated to complete all balance exercises in a corner in a room for safety and use countertop support as needed for  tandem stance. Patient verbalized understanding.     PT Education - 05/26/20 1055    Education Details HEP update; See Self Care    Person(s) Educated Patient    Methods Explanation;Demonstration;Handout    Comprehension Verbalized understanding;Returned demonstration;Need further instruction            PT Short Term Goals - 05/21/20 1201      PT SHORT TERM GOAL #1   Title Pt will increase 2 MWT to at least 227' to demonstrate minimal detectable change for improved endurance    Baseline 173' on 05/21/20    Time 4    Period Weeks    Status Revised    Target  Date 06/18/20      PT SHORT TERM GOAL #2   Title Pt and family will be independent with performing desensitization techniques for R side    Time 2    Status On-going    Target Date 05/28/20             PT Long Term Goals - 05/14/20 1238      PT LONG TERM GOAL #1   Title Pt will be independent with advanced HEP to improve strength and endurance    Time 6    Period Weeks    Status New    Target Date 06/25/20      PT LONG TERM GOAL #2   Title Pt will improve FOTO score to </=50% impairment    Baseline 81% impaired (05/14/20)    Time 6    Period Weeks    Status New    Target Date 06/25/20      PT LONG TERM GOAL #3   Title Pt will have improved 5x STS to </=45 sec to demonstrate improved functional strength and activity tolerance    Baseline 1 min and 29 sec (05/14/20)    Time 6    Period Weeks    Status New    Target Date 06/25/20      PT LONG TERM GOAL #4   Title Pt will improve gait speed to at least 2 feet/sec to demo limited community ambulation    Baseline 1.71 feet/sec (05/14/20)    Time 6    Period Weeks    Status New    Target Date 06/25/20      PT LONG TERM GOAL #5   Title Pt will have improved TUG score to at least 15 sec for reduced fall risk    Baseline 22.9 sec (05/14/20)    Time 6    Period Weeks    Status New    Target Date 06/25/20                 Plan - 05/26/20 1055     Clinical Impression Statement Today's skilled PT session included completion of balance exercises on complaint surfaces, vision removed. Patient demo increased balance challenge with narrow BOS on complaint surfaces and vision removed. Updated HEP to included balance exercises as tolerated. Will continue to progress toward all goals.    Personal Factors and Comorbidities Age;Comorbidity 1;Comorbidity 2;Comorbidity 3+;Time since onset of injury/illness/exacerbation    Comorbidities CAD status post PCI, chronic combined systolic and diastolic CHF (EF 45 to 50% on echo done April 2021), hypertension, hyperlipidemia, insulin-dependent type 2 diabetes, cocaine abuse, tobacco use    Examination-Activity Limitations Bathing;Bed Mobility;Bend;Caring for Others;Carry;Dressing;Hygiene/Grooming;Locomotion Level;Transfers;Self Feeding;Sleep;Squat;Stand;Stairs;Toileting    Examination-Participation Restrictions Cleaning;Community Activity;Meal Prep;Laundry;Yard Work    Conservation officer, historic buildings Evolving/Moderate complexity    Rehab Potential Fair    PT Frequency Other (comment)   2x/wk for 3-4 weeks and then transition to 1x/wk for a total of 6 weeks   PT Duration 6 weeks    PT Treatment/Interventions ADLs/Self Care Home Management;Aquatic Therapy;Cryotherapy;Electrical Stimulation;Iontophoresis 4mg /ml Dexamethasone;Moist Heat;Ultrasound;DME Instruction;Gait training;Stair training;Functional mobility training;Therapeutic activities;Therapeutic exercise;Balance training;Neuromuscular re-education;Manual techniques;Patient/family education;Passive range of motion;Energy conservation;Taping    PT Next Visit Plan How was HEP update for balance? Initiate gentle stretching and strengthening. Please initiate walking program/endurance training.    PT Home Exercise Plan Access Code: [email or web address] + desenitization 5 min 3x/more per day    Consulted and Agree with Plan of Care Patient;Family member/caregiver   Husband  Family Member Consulted Husband, Will           Patient will benefit from skilled therapeutic intervention in order to improve the following deficits and impairments:  Decreased coordination, Decreased range of motion, Difficulty walking, Impaired UE functional use, Decreased endurance, Decreased activity tolerance, Pain, Decreased balance, Decreased mobility, Decreased strength, Impaired sensation  Visit Diagnosis: Difficulty in walking, not elsewhere classified  Muscle weakness (generalized)  Other abnormalities of gait and mobility  Other disturbances of skin sensation     Problem List Patient Active Problem List   Diagnosis Date Noted  . Acute CVA (cerebrovascular accident) (HCC) 03/23/2020  . Hyperglycemia due to type 2 diabetes mellitus (HCC) 03/23/2020  . Hypertension 11/28/2019  . UTI (urinary tract infection) 11/28/2019  . Hyperlipidemia with target LDL less than 70   . Tobacco abuse   . Type II diabetes mellitus with neurological manifestations, uncontrolled (HCC) 11/25/2019  . Acute ST elevation myocardial infarction (STEMI) of anterior wall (HCC) 11/23/2019  . CAD S/P percutaneous coronary angioplasty 11/23/2019  . Acute ST elevation myocardial infarction (STEMI) of anteroseptal wall (HCC) 11/23/2019  . Sepsis (HCC) 09/19/2019  . Cocaine abuse with cocaine-induced mood disorder (HCC) 06/10/2017    Tempie Donning, PT, DPT 05/26/2020, 10:59 AM  [facility] [address] [place], [place], [address] Phone: [phone]   Fax:  [phone]  Name: Maria Harper MRN: [id number] Date of Birth: [birth year]

## 2020-05-26 NOTE — Patient Instructions (Signed)
Access Code: [email or web address] URL: [email or web address] Date: 05/26/2020 Prepared by: Jethro Bastos  Exercises Seated Toe Towel Scrunches - 1 x daily - 7 x weekly - 2 sets - 10 reps Ankle Inversion Eversion Towel Slide - 1 x daily - 7 x weekly - 2 sets - 10 reps Heel Toe Raises with Counter Support - 1 x daily - 7 x weekly - 2 sets - 10 reps Standing Gastroc Stretch - 1 x daily - 7 x weekly - 20 sec hold Soleus Stretch on Wall - 1 x daily - 7 x weekly - 20 sec hold Standing with Head Rotation on Pillow - 1 x daily - 5 x weekly - 2 sets - 10 reps Standing Balance with Eyes Closed on Pillow - 1 x daily - 5 x weekly - 1 sets - 3 reps - 30 hold Standing Tandem Balance with Counter Support - 1 x daily - 5 x weekly - 1 sets - 3 reps - 30 hold

## 2020-05-27 ENCOUNTER — Telehealth: Payer: Self-pay

## 2020-05-27 NOTE — Telephone Encounter (Signed)
Called and spoke w/pt stated that they are approved for the repatha through snf and that they need to schedule shipments, pt voiced understanding

## 2020-06-02 ENCOUNTER — Ambulatory Visit: Payer: Self-pay | Admitting: Physical Therapy

## 2020-06-03 ENCOUNTER — Encounter (HOSPITAL_BASED_OUTPATIENT_CLINIC_OR_DEPARTMENT_OTHER): Payer: Self-pay | Admitting: Internal Medicine

## 2020-06-04 ENCOUNTER — Ambulatory Visit: Payer: Self-pay | Attending: Family Medicine | Admitting: Physical Therapy

## 2020-06-04 DIAGNOSIS — R208 Other disturbances of skin sensation: Secondary | ICD-10-CM | POA: Insufficient documentation

## 2020-06-04 DIAGNOSIS — M6281 Muscle weakness (generalized): Secondary | ICD-10-CM | POA: Insufficient documentation

## 2020-06-04 DIAGNOSIS — R262 Difficulty in walking, not elsewhere classified: Secondary | ICD-10-CM | POA: Insufficient documentation

## 2020-06-04 DIAGNOSIS — R2689 Other abnormalities of gait and mobility: Secondary | ICD-10-CM | POA: Insufficient documentation

## 2020-06-06 ENCOUNTER — Ambulatory Visit: Payer: Self-pay | Admitting: Physical Therapy

## 2020-06-09 ENCOUNTER — Other Ambulatory Visit: Payer: Self-pay

## 2020-06-09 ENCOUNTER — Ambulatory Visit: Payer: Self-pay | Admitting: Physical Therapy

## 2020-06-09 DIAGNOSIS — R208 Other disturbances of skin sensation: Secondary | ICD-10-CM

## 2020-06-09 DIAGNOSIS — R2689 Other abnormalities of gait and mobility: Secondary | ICD-10-CM

## 2020-06-09 DIAGNOSIS — R262 Difficulty in walking, not elsewhere classified: Secondary | ICD-10-CM

## 2020-06-09 DIAGNOSIS — M6281 Muscle weakness (generalized): Secondary | ICD-10-CM

## 2020-06-09 NOTE — Therapy (Signed)
[facility] [address] [place], [place], [address] Phone: [phone]   Fax:  [phone]  Physical Therapy Treatment  Patient Details  Name: Maria Harper MRN: [id number] Date of Birth: 1971-02-19 Referring Provider (PT): Narda Bonds, MD   Encounter Date: 06/09/2020   PT End of Session - 06/09/20 1212    Visit Number 4    Number of Visits 11    Date for PT Re-Evaluation 06/25/20    Authorization Type Self-pay    PT Start Time 1020    PT Stop Time 1100    PT Time Calculation (min) 40 min    Activity Tolerance Patient tolerated treatment well;Patient limited by fatigue    Behavior During Therapy [facility] for tasks assessed/performed           Past Medical History:  Diagnosis Date  . Acid reflux   . Cocaine abuse (HCC)   . Coronary artery disease    s/p STEMI 11/2019; PCI/DES to mLAD in 2017 and PCI/DES to dLAD in 11/2019  . Diabetes mellitus without complication (HCC)   . Heart attack (HCC) 04/2016  . High cholesterol   . Hypertension   . Ischemic cardiomyopathy 11/2019  . Tobacco abuse     Past Surgical History:  Procedure Laterality Date  . CORONARY/GRAFT ACUTE MI REVASCULARIZATION N/A 11/23/2019   Procedure: CORONARY/GRAFT ACUTE MI REVASCULARIZATION;  Surgeon: [name], MD;  Location: [facility] INVASIVE CV LAB;  Service: Cardiovascular;  Laterality: N/A;  . LEFT HEART CATH AND CORONARY ANGIOGRAPHY N/A 11/23/2019   Procedure: LEFT HEART CATH AND CORONARY ANGIOGRAPHY;  Surgeon: [name], MD;  Location: [facility] INVASIVE CV LAB;  Service: Cardiovascular;  Laterality: N/A;    There were no vitals filed for this visit.   Subjective Assessment - 06/09/20 1025    Subjective Pt reports she still has continued numbness and tingling; husband has continued to perform desensitization but he notes that pt has variable response. Pt states that she was able to perform her balance exercises twice since last visit.  Pt still feels she is dragging her right leg.    Patient is accompained by: Family member   Husband   Pertinent History CAD status post PCI, chronic combined systolic and diastolic CHF (EF 45 to 50% on echo done April 2021), hypertension, hyperlipidemia, insulin-dependent type 2 diabetes, cocaine abuse, tobacco use, GERD    Limitations Standing    How long can you sit comfortably? n/a    How long can you stand comfortably? 3-5 minutes (limited due to pain)    How long can you walk comfortably? 3-5 minutes with constant stops (limited due to pain)    Diagnostic tests MRI 8/22: 1. Small focus of acute/subacute ischemia within the left thalamus.No hemorrhage or mass effect.2. Old bilateral basal ganglia small vessel infarcts.3. Normal cervical spinal cord.4. Mild cervical degenerative disc disease without spinal canal orneural foraminal stenosis.    Patient Stated Goals Improve sensation on right side, improve ambulation and general mobility    Pain Onset More than a month ago              [facility] PT Assessment - 06/09/20 0001      PROM   Right Ankle Dorsiflexion -5    Left Ankle Dorsiflexion 0                         [facility] Adult PT Treatment/Exercise - 06/09/20 0001  Ambulation/Gait   Ambulation/Gait Assistance 5: Supervision    Ambulation/Gait Assistance Details normal vitals    Ambulation Distance (Feet) 330 Feet    Assistive device None    Gait Pattern Step-through pattern;Right foot flat;Wide base of support    Ambulation Surface Level;Indoor    Gait Comments Cues for heel strike, weight shift, and narrower BOS      Manual Therapy   Manual Therapy Passive ROM;Joint mobilization    Joint Mobilization R ankle talocrural grade III PA mobs    Passive ROM R ankle DF & PF      Ankle Exercises: Stretches   Soleus Stretch 30 seconds    Gastroc Stretch 30 seconds      Ankle Exercises: Standing   Rocker Board 2 minutes    Rocker Board Limitations Performed A/P  and lateral for weight shifting    Heel Raises Both;10 reps    Toe Raise 10 reps               Balance Exercises - 06/09/20 0001      Balance Exercises: Standing   Standing Eyes Opened Wide (BOA);Head turns;Foam/compliant surface;Limitations    Standing Eyes Opened Limitations completed standing on airex completed horizontal/vertical head turns x 10 reps.     Standing Eyes Closed Wide (BOA);Foam/compliant surface;3 reps;Limitations;30 secs    Standing Eyes Closed Limitations with head turns x 30 sec and head nods x30 sec    Standing, One Foot on a Step Eyes closed;4 inch;30 secs   x3 sets   Rockerboard Intermittent UE support;30 seconds   Maintaining center of laterally oriented rockerboard            PT Education - 06/09/20 1107    Education Details Discussed including a walking program and importance of continued stretching of her right ankle due to stiffness.    Person(s) Educated Patient    Methods Explanation;Demonstration;Verbal cues    Comprehension Verbalized understanding;Returned demonstration            PT Short Term Goals - 06/09/20 1215      PT SHORT TERM GOAL #1   Title Pt will increase 2 MWT to at least 227' to demonstrate minimal detectable change for improved endurance    Baseline 173' on 05/21/20    Time 4    Period Weeks    Status On-going    Target Date 06/18/20      PT SHORT TERM GOAL #2   Title Pt and family will be independent with performing desensitization techniques for R side    Time 2    Status Achieved    Target Date 05/28/20             PT Long Term Goals - 05/14/20 1238      PT LONG TERM GOAL #1   Title Pt will be independent with advanced HEP to improve strength and endurance    Time 6    Period Weeks    Status New    Target Date 06/25/20      PT LONG TERM GOAL #2   Title Pt will improve FOTO score to </=50% impairment    Baseline 81% impaired (05/14/20)    Time 6    Period Weeks    Status New    Target Date  06/25/20      PT LONG TERM GOAL #3   Title Pt will have improved 5x STS to </=45 sec to demonstrate improved functional strength and activity tolerance    Baseline  1 min and 29 sec (05/14/20)    Time 6    Period Weeks    Status New    Target Date 06/25/20      PT LONG TERM GOAL #4   Title Pt will improve gait speed to at least 2 feet/sec to demo limited community ambulation    Baseline 1.71 feet/sec (05/14/20)    Time 6    Period Weeks    Status New    Target Date 06/25/20      PT LONG TERM GOAL #5   Title Pt will have improved TUG score to at least 15 sec for reduced fall risk    Baseline 22.9 sec (05/14/20)    Time 6    Period Weeks    Status New    Target Date 06/25/20                 Plan - 06/09/20 1108    Clinical Impression Statement Treatment session focused on improving pt's ankle movements and weightshifting to her R LE by wroking on balance on compliant surface with vision removed, rockerboard, and one foot on step. Provided gait training to address pt's concern that her R foot is dragging. Encouraged heel strike and for pt to be more cognizant of weight shifting to her right. Discussed how tone can occur after CVA and that pt needs to be consistent performing her stretches daily. Discussed with pt and husband about changing to 1x/wk to better fit their availability.    Personal Factors and Comorbidities Age;Comorbidity 1;Comorbidity 2;Comorbidity 3+;Time since onset of injury/illness/exacerbation    Comorbidities CAD status post PCI, chronic combined systolic and diastolic CHF (EF 45 to 50% on echo done April 2021), hypertension, hyperlipidemia, insulin-dependent type 2 diabetes, cocaine abuse, tobacco use    Examination-Activity Limitations Bathing;Bed Mobility;Bend;Caring for Others;Carry;Dressing;Hygiene/Grooming;Locomotion Level;Transfers;Self Feeding;Sleep;Squat;Stand;Stairs;Toileting    Examination-Participation Restrictions Cleaning;Community Activity;Meal  Prep;Laundry;Yard Work    Conservation officer, historic buildings Evolving/Moderate complexity    Rehab Potential Fair    PT Frequency Other (comment)   2x/wk for 3-4 weeks and then transition to 1x/wk for a total of 6 weeks   PT Duration 6 weeks    PT Treatment/Interventions ADLs/Self Care Home Management;Aquatic Therapy;Cryotherapy;Electrical Stimulation;Iontophoresis 4mg /ml Dexamethasone;Moist Heat;Ultrasound;DME Instruction;Gait training;Stair training;Functional mobility training;Therapeutic activities;Therapeutic exercise;Balance training;Neuromuscular re-education;Manual techniques;Patient/family education;Passive range of motion;Energy conservation;Taping    PT Next Visit Plan How was HEP update for balance? How is the walking program?  Initiate gentle stretching and strengthening.    PT Home Exercise Plan Access Code: [email or web address] + desenitization 5 min 3x/more per day + walking program    Consulted and Agree with Plan of Care Patient;Family member/caregiver   Husband   Family Member Consulted Husband, Will           Patient will benefit from skilled therapeutic intervention in order to improve the following deficits and impairments:  Decreased coordination, Decreased range of motion, Difficulty walking, Impaired UE functional use, Decreased endurance, Decreased activity tolerance, Pain, Decreased balance, Decreased mobility, Decreased strength, Impaired sensation  Visit Diagnosis: Difficulty in walking, not elsewhere classified  Muscle weakness (generalized)  Other abnormalities of gait and mobility  Other disturbances of skin sensation     Problem List Patient Active Problem List   Diagnosis Date Noted  . Acute CVA (cerebrovascular accident) (HCC) 03/23/2020  . Hyperglycemia due to type 2 diabetes mellitus (HCC) 03/23/2020  . Hypertension 11/28/2019  . UTI (urinary tract infection) 11/28/2019  . Hyperlipidemia with target LDL less than 70   .  Tobacco abuse   . Type II diabetes  mellitus with neurological manifestations, uncontrolled (HCC) 11/25/2019  . Acute ST elevation myocardial infarction (STEMI) of anterior wall (HCC) 11/23/2019  . CAD S/P percutaneous coronary angioplasty 11/23/2019  . Acute ST elevation myocardial infarction (STEMI) of anteroseptal wall (HCC) 11/23/2019  . Sepsis (HCC) 09/19/2019  . Cocaine abuse with cocaine-induced mood disorder [facility]) 06/10/2017    Dilon Lank April Ma L [place] PT, DPT 06/09/2020, 12:16 PM  [facility] [address] [place], [place], [address] Phone: [phone]   Fax:  [phone]  Name: Maria Harper MRN: [id number] Date of Birth: 1971/04/06

## 2020-06-11 ENCOUNTER — Ambulatory Visit: Payer: Self-pay | Admitting: Physical Therapy

## 2020-06-18 ENCOUNTER — Ambulatory Visit: Payer: Self-pay | Admitting: Physical Therapy

## 2020-06-20 ENCOUNTER — Ambulatory Visit: Payer: Self-pay | Admitting: Physical Therapy

## 2020-06-24 ENCOUNTER — Ambulatory Visit: Payer: Self-pay | Admitting: Physical Therapy

## 2020-06-25 ENCOUNTER — Ambulatory Visit: Payer: Self-pay | Admitting: Physical Therapy

## 2020-07-03 ENCOUNTER — Telehealth: Payer: Self-pay | Admitting: Physical Therapy

## 2020-07-03 ENCOUNTER — Ambulatory Visit: Payer: Self-pay | Attending: Family Medicine | Admitting: Physical Therapy

## 2020-07-03 ENCOUNTER — Other Ambulatory Visit: Payer: Self-pay

## 2020-07-03 DIAGNOSIS — R262 Difficulty in walking, not elsewhere classified: Secondary | ICD-10-CM | POA: Insufficient documentation

## 2020-07-03 DIAGNOSIS — R208 Other disturbances of skin sensation: Secondary | ICD-10-CM | POA: Insufficient documentation

## 2020-07-03 DIAGNOSIS — M6281 Muscle weakness (generalized): Secondary | ICD-10-CM | POA: Insufficient documentation

## 2020-07-03 DIAGNOSIS — R2689 Other abnormalities of gait and mobility: Secondary | ICD-10-CM | POA: Insufficient documentation

## 2020-07-03 NOTE — Telephone Encounter (Signed)
PT in the middle of calling pt in regards to her appointment, when front desk staff informs PT that pt has arrived.  Pt has arrived 15 minutes late due to the bus.   Tarhonda Hollenberg April Dell Ponto, PT, DPT

## 2020-07-03 NOTE — Therapy (Signed)
[facility] [address] [place], [place], [address] Phone: [phone]   Fax:  [phone]  Physical Therapy Treatment and Discharge  Patient Details  Name: Maria Harper MRN: [id number] Date of Birth: 31-May-1971 Referring Provider (PT): Mariel Aloe, MD   Encounter Date: 07/03/2020   PT End of Session - 07/03/20 1356    Visit Number 5    Number of Visits 11    Date for PT Re-Evaluation 06/25/20    Authorization Type Self-pay    PT Start Time 1115    PT Stop Time 1155    PT Time Calculation (min) 40 min    Activity Tolerance Patient tolerated treatment well    Behavior During Therapy [facility] for tasks assessed/performed           Past Medical History:  Diagnosis Date  . Acid reflux   . Cocaine abuse ([place])   . Coronary artery disease    s/p STEMI 11/2019; PCI/DES to mLAD in 2017 and PCI/DES to dLAD in 11/2019  . Diabetes mellitus without complication ([place])   . Heart attack ([place]) 04/2016  . High cholesterol   . Hypertension   . Ischemic cardiomyopathy 11/2019  . Tobacco abuse     Past Surgical History:  Procedure Laterality Date  . CORONARY/GRAFT ACUTE MI REVASCULARIZATION N/A 11/23/2019   Procedure: CORONARY/GRAFT ACUTE MI REVASCULARIZATION;  Surgeon: Leonie Man, MD;  Location: [place] CV LAB;  Service: Cardiovascular;  Laterality: N/A;  . LEFT HEART CATH AND CORONARY ANGIOGRAPHY N/A 11/23/2019   Procedure: LEFT HEART CATH AND CORONARY ANGIOGRAPHY;  Surgeon: Leonie Man, MD;  Location: [place] CV LAB;  Service: Cardiovascular;  Laterality: N/A;    There were no vitals filed for this visit.   Subjective Assessment - 07/03/20 1119    Subjective Pt reports that sensation remains the same. She reports she has been consistent with doing her exercises at least every other day. Pt reports no pain but LE numbness.    Patient is accompained by: Family member   Husband   Pertinent History CAD  status post PCI, chronic combined systolic and diastolic CHF (EF 45 to 79% on echo done April 2021), hypertension, hyperlipidemia, insulin-dependent type 2 diabetes, cocaine abuse, tobacco use, GERD    Limitations Standing    How long can you sit comfortably? n/a    How long can you stand comfortably? 3-5 minutes (limited due to pain)    How long can you walk comfortably? 3-5 minutes with constant stops (limited due to pain)    Diagnostic tests MRI 8/22: 1. Small focus of acute/subacute ischemia within the left thalamus.No hemorrhage or mass effect.2. Old bilateral basal ganglia small vessel infarcts.3. Normal cervical spinal cord.4. Mild cervical degenerative disc disease without spinal canal orneural foraminal stenosis.    Patient Stated Goals Improve sensation on right side, improve ambulation and general mobility    Currently in Pain? No/denies    Pain Onset More than a month ago              [facility] PT Assessment - 07/03/20 0001      PROM   Right Ankle Dorsiflexion 5    Left Ankle Dorsiflexion 5      Ambulation/Gait   Ambulation/Gait Assistance 5: Supervision    Ambulation Distance (Feet) --   see 6 MWT   Assistive device None    Gait Pattern Step-through pattern    Ambulation Surface Level;Indoor      6  Minute Walk- Baseline   6 Minute Walk- Baseline yes    Modified Borg Scale for Dyspnea 0- Nothing at all      6 Minute walk- Post Test   6 Minute Walk Post Test yes    Modified Borg Scale for Dyspnea 8-      6 minute walk test results    Aerobic Endurance Distance Walked 729    Endurance additional comments 2 MWT 275'      Standardized Balance Assessment   Five times sit to stand comments  31.34 sec      Timed Up and Go Test   Normal TUG (seconds) 14.34                         [facility] Adult PT Treatment/Exercise - 07/03/20 0001      Exercises   Exercises Knee/Hip      Knee/Hip Exercises: Seated   Sit to Sand without UE support   3x5 reps               Balance Exercises - 07/03/20 0001      Balance Exercises: Standing   Standing Eyes Closed Wide (BOA);Foam/compliant surface;3 reps;Limitations;30 secs    Standing Eyes Closed Limitations with head turns x 30 sec and head nods x30 sec               PT Short Term Goals - 06/09/20 1215      PT SHORT TERM GOAL #1   Title Pt will increase 2 MWT to at least 227' to demonstrate minimal detectable change for improved endurance    Baseline 173' on 05/21/20    Time 4    Period Weeks    Status On-going    Target Date 06/18/20      PT SHORT TERM GOAL #2   Title Pt and family will be independent with performing desensitization techniques for R side    Time 2    Status Achieved    Target Date 05/28/20             PT Long Term Goals - 07/03/20 1351      PT LONG TERM GOAL #1   Title Pt will be independent with advanced HEP to improve strength and endurance    Time 6    Period Weeks    Status Achieved      PT LONG TERM GOAL #2   Title Pt will improve FOTO score to </=50% impairment    Baseline 81% impaired (05/14/20)    Time 6    Period Weeks    Status Deferred      PT LONG TERM GOAL #3   Title Pt will have improved 5x STS to </=45 sec to demonstrate improved functional strength and activity tolerance    Baseline 1 min and 29 sec (05/14/20); 31.34 sec (07/03/20)    Time 6    Period Weeks    Status Achieved      PT LONG TERM GOAL #4   Title Pt will improve gait speed to at least 2 feet/sec to demo limited community ambulation    Baseline 1.71 feet/sec (05/14/20); 2.62 feet/sec    Time 6    Period Weeks    Status Achieved      PT LONG TERM GOAL #5   Title Pt will have improved TUG score to at least 15 sec for reduced fall risk    Baseline 22.9 sec (05/14/20); 14.34 sec (07/03/20)  Time 6    Period Weeks    Status Achieved                 Plan - 07/03/20 1434    Clinical Impression Statement Pt presents to clinic for re-evaluation of goals. At  this time pt has met all LTGs (except FOTO score deferred) with improved gait quality. Pt still has ongoing numbness/abnormal sensation on R side which has not improved with desensitization techniques; however, palpation is less tender/sensitive with PROM. Despite pt meeting LTGs, she still has issues with strength and endurance. Encouraged pt and family to work on this at home. PT provided pt with HEP to address these issues. Additionally, recommended for pt and husband to follow up with a neurologist for her CVA and further guidance on her decreased sensation. Pt is ready for PT d/c.    Personal Factors and Comorbidities Age;Comorbidity 1;Comorbidity 2;Comorbidity 3+;Time since onset of injury/illness/exacerbation    Comorbidities CAD status post PCI, chronic combined systolic and diastolic CHF (EF 45 to 36% on echo done April 2021), hypertension, hyperlipidemia, insulin-dependent type 2 diabetes, cocaine abuse, tobacco use    Examination-Activity Limitations Bathing;Bed Mobility;Bend;Caring for Others;Carry;Dressing;Hygiene/Grooming;Locomotion Level;Transfers;Self Feeding;Sleep;Squat;Stand;Stairs;Toileting    Examination-Participation Restrictions Cleaning;Community Activity;Meal Prep;Laundry;Yard Work    Merchant navy officer Evolving/Moderate complexity    Rehab Potential Fair    PT Frequency Other (comment)   2x/wk for 3-4 weeks and then transition to 1x/wk for a total of 6 weeks   PT Duration 6 weeks    PT Treatment/Interventions ADLs/Self Care Home Management;Aquatic Therapy;Cryotherapy;Electrical Stimulation;Iontophoresis 69m/ml Dexamethasone;Moist Heat;Ultrasound;DME Instruction;Gait training;Stair training;Functional mobility training;Therapeutic activities;Therapeutic exercise;Balance training;Neuromuscular re-education;Manual techniques;Patient/family education;Passive range of motion;Energy conservation;Taping    PT Next Visit Plan How was HEP update for balance? How is the  walking program?  Initiate gentle stretching and strengthening.    PT Home Exercise Plan Access Code: [email or web address] + desenitization 5 min 3x/more per day + walking program    Consulted and Agree with Plan of Care Patient;Family member/caregiver   Husband   Family Member Consulted Husband, Will          PHYSICAL THERAPY DISCHARGE SUMMARY  Visits from Start of Care: 5  Current functional level related to goals / functional outcomes: Has met most of PT goals   Remaining deficits: Pt still has < than age norms for 6 MWT; however, she is now able to tolerate performing 6 MWT (unable to tolerate this on initial eval). Pt still has some decreased strength/endurance as her 5xSTS goal is met but is still indicative of fall risk. Pt also continues to have right side numbness that has not improved.   Education / Equipment: Discussed with pt and husband to follow up with a neurologist in regards to her numbness -- at this point, PT has no other alternative methods to treat her abnormal sensation. PT provided pt and husband last HEP to work on strength, endurance, and balance.  Plan: Patient agrees to discharge.  Patient goals were met. Patient is being discharged due to not returning since the last visit.  ?????       Patient will benefit from skilled therapeutic intervention in order to improve the following deficits and impairments:  Decreased coordination, Decreased range of motion, Difficulty walking, Impaired UE functional use, Decreased endurance, Decreased activity tolerance, Pain, Decreased balance, Decreased mobility, Decreased strength, Impaired sensation  Visit Diagnosis: Difficulty in walking, not elsewhere classified  Muscle weakness (generalized)  Other abnormalities of gait and mobility  Other disturbances of skin  sensation     Problem List Patient Active Problem List   Diagnosis Date Noted  . Acute CVA (cerebrovascular accident) ([place]) 03/23/2020  . Hyperglycemia due to  type 2 diabetes mellitus ([place]) 03/23/2020  . Hypertension 11/28/2019  . UTI (urinary tract infection) 11/28/2019  . Hyperlipidemia with target LDL less than 70   . Tobacco abuse   . Type II diabetes mellitus with neurological manifestations, uncontrolled ([place]) 11/25/2019  . Acute ST elevation myocardial infarction (STEMI) of anterior wall ([place]) 11/23/2019  . CAD S/P percutaneous coronary angioplasty 11/23/2019  . Acute ST elevation myocardial infarction (STEMI) of anteroseptal wall ([place]) 11/23/2019  . Sepsis ([place]) 09/19/2019  . Cocaine abuse with cocaine-induced mood disorder [facility]) 06/10/2017    Serenna Deroy April Ma L [place] PT, DPT 07/03/2020, 2:37 PM  [facility] [address] [place], [place], [address] Phone: [phone]   Fax:  [phone]  Name: Reanna Scoggin MRN: [id number] Date of Birth: [birth year]

## 2020-07-07 ENCOUNTER — Ambulatory Visit: Payer: Self-pay | Admitting: Physical Therapy

## 2020-07-10 ENCOUNTER — Encounter (HOSPITAL_BASED_OUTPATIENT_CLINIC_OR_DEPARTMENT_OTHER): Payer: Self-pay | Admitting: Internal Medicine

## 2020-07-14 ENCOUNTER — Ambulatory Visit: Payer: Self-pay | Admitting: Physical Therapy

## 2020-07-14 NOTE — Progress Notes (Signed)
Cardiology Office Note   Date:  07/17/2020   ID:  Maria Harper, DOB [birth year], MRN [id number]  PCP:  Maria Sharps, NP  Cardiologist:  Maria Lemma, MD EP: None  Chief Complaint  Patient presents with  . Follow-up    CAD and CHF      History of Present Illness: Maria Harper is a 49 y.o. female with a PMH of CAD s/p STEMI 11/2019 with previous PCI/DES LAD in 2017, chronic combined CHF/ICM, HTN, HLD, DM type 2, GERD, cocaine abuse, tobacco abuse, and recent CVA 03/2020, who presents for routine follow-up.   She was last evaluated by cardiology at an outpatient visit with myself 04/2020 in follow-up of a recent hospitalization for an acute CVA 03/2020. Echo that admission showed improvement in EF to 55-60%, moderate concentric LVH (consider cardiac MRI to evaluate for possible infiltrative and hypertrophic cardiomyopathies), G1DD, and mild MR. Carotid dopplers did not reveal significant stenosis. Prior to this she had a STEMI 11/2019 with 100% dLAD stenosis and 100% 3rd diagonal stenosis though unsuccessful balloon angioplasty due to inability to advance into distal vessel, dLAD lesion was reduced to 70% stenosis after PCI attempts. Echo at the time of her MI showed EF 45-50%. At her last visit she reported occasional chest pain, DOE and symptoms c/f OSA. She was not compliant with her GDMT for CAD or CHF. She had significant small vessel disease noted on her LHC and decision made to focus on medical management by increasing imdur to  daily and carvedilol to 12.5mg  BID. She was strongly encouraged to complete her previously ordered sleep study, though it appears this still has not been done.   She presents today with her husband for follow-up. She has continued to decline physically since her last visit. Memory trouble limits history taking. She completed a stent in rehab, however since returning home has struggled with ongoing weakness and gait instability resulting in several  falls. Thankfully no serious injury has resulted from these. Additionally she has continued to have intermittent chest pain. These are often non-exertional in nature, occurring once a week, and generally relieved with SL nitro. Her last episode of chest pain was yesterday, though occurred several times throughout the day. Currently chest pain free. She does have chronic DOE which is unchanged. She is working on cutting back smoking, currently down to 4 cigarettes a day. No complaints of LE edema, orthopnea, dizziness, lightheadedness, or syncope. She has struggled with medication compliance, often missing a weeks worth of medications due to lapse in refills. She is also needing to establish care with a PCP, though lack of insurance has limited follow-up. Will reach out to our SW to see what options we have to support her. Will send Rx for 90 day supplies to limit interruption in therapy.     Past Medical History:  Diagnosis Date  . Acid reflux   . Cocaine abuse (HCC)   . Coronary artery disease    s/p STEMI 11/2019; PCI/DES to mLAD in 2017 and PCI/DES to dLAD in 11/2019  . Diabetes mellitus without complication (HCC)   . Heart attack (HCC) 04/2016  . High cholesterol   . Hypertension   . Ischemic cardiomyopathy 11/2019  . Tobacco abuse     Past Surgical History:  Procedure Laterality Date  . CORONARY/GRAFT ACUTE MI REVASCULARIZATION N/A 11/23/2019   Procedure: CORONARY/GRAFT ACUTE MI REVASCULARIZATION;  Surgeon: Maria Lex, MD;  Location: [facility] INVASIVE CV LAB;  Service: Cardiovascular;  Laterality: N/A;  .  LEFT HEART CATH AND CORONARY ANGIOGRAPHY N/A 11/23/2019   Procedure: LEFT HEART CATH AND CORONARY ANGIOGRAPHY;  Surgeon: Maria Lex, MD;  Location: [facility] INVASIVE CV LAB;  Service: Cardiovascular;  Laterality: N/A;     Current Outpatient Medications  Medication Sig Dispense Refill  . albuterol (VENTOLIN HFA) 108 (90 Base) MCG/ACT inhaler Inhale 2 puffs into the lungs every 6 (six)  hours as needed for wheezing or shortness of breath.    Marland Kitchen aspirin 81 MG chewable tablet Chew 81 mg by mouth daily.    . cyclobenzaprine (FLEXERIL) 10 MG tablet Take 10 mg by mouth 3 (three) times daily as needed for muscle spasms.     Marland Kitchen gabapentin (NEURONTIN) 300 MG capsule Take 300 mg by mouth 3 (three) times daily.    Marland Kitchen HUMULIN R 100 UNIT/ML injection Inject 17 Units into the skin 2 (two) times daily before a meal.   0  . insulin NPH-regular Human (70-30) 100 UNIT/ML injection Inject 35 Units into the skin 2 (two) times daily.     Marland Kitchen loratadine (CLARITIN) 10 MG tablet Take 10 mg by mouth daily.    . pantoprazole (PROTONIX) 40 MG tablet Take 40 mg by mouth 2 (two) times daily.     . polyethylene glycol powder (GLYCOLAX/MIRALAX) powder Take 17 g by mouth daily as needed for moderate constipation.     . QUEtiapine (SEROQUEL) 200 MG tablet Take 200 mg by mouth daily.     . QUEtiapine (SEROQUEL) 400 MG tablet Take 400 mg by mouth at bedtime.    Marland Kitchen atorvastatin (LIPITOR) 80 MG tablet Take 1 tablet (80 mg total) by mouth daily at 6 PM. 90 tablet 3  . carvedilol (COREG) 25 MG tablet Take 1 tablet (25 mg total) by mouth 2 (two) times daily with a meal. 180 tablet 3  . empagliflozin (JARDIANCE) 10 MG TABS tablet Take 1 tablet (10 mg total) by mouth daily before breakfast. 90 tablet 3  . isosorbide mononitrate (IMDUR) 60 MG 24 hr tablet Take 1.5 tablets (90 mg total) by mouth daily. 135 tablet 3  . losartan (COZAAR) 25 MG tablet Take 1 tablet (25 mg total) by mouth daily. 90 tablet 3  . nitroGLYCERIN (NITROSTAT) 0.4 MG SL tablet Place 1 tablet (0.4 mg total) under the tongue as needed for chest pain. 25 tablet 2  . ticagrelor (BRILINTA) 90 MG TABS tablet Take 1 tablet (90 mg total) by mouth 2 (two) times daily. 180 tablet 3   No current facility-administered medications for this visit.    Allergies:   Bee venom, Contrast media [iodinated diagnostic agents], and Tomato    Social History:  The patient   reports that she has been smoking cigarettes. She has a 30.00 pack-year smoking history. She has never used smokeless tobacco. She reports previous alcohol use. She reports current drug use. Drug: "Crack" cocaine.   Family History:  The patient's family history includes Diabetes in her father, maternal grandfather, maternal grandmother, and mother.    ROS:  Please see the history of present illness.   Otherwise, review of systems are positive for none.   All other systems are reviewed and negative.    PHYSICAL EXAM: VS:  BP 140/84   Pulse 94   Ht 5\' 2"  (1.575 m)   Wt 180 lb 9.6 oz (81.9 kg)   BMI 33.03 kg/m  , BMI Body mass index is 33.03 kg/m. GEN: chronically ill appearing, sitting in a wheelchair in NAD HEENT: sclera anicteric Neck:  no JVD, carotid bruits, or masses Cardiac: RRR; + murmur, no rubs or gallops, trace LE edema  Respiratory:  clear to auscultation bilaterally, normal work of breathing GI: soft, nontender, nondistended, + BS MS: no deformity or atrophy Skin: warm and dry, no rash Neuro:  Strength and sensation are intact Psych: euthymic mood, full affect   EKG:  EKG is ordered today. The ekg ordered today demonstrates sinus rhythm with rate 88 bpm, non-specific T wave abnormalities, no STE/D, no significant change from previous.    Recent Labs: 09/19/2019: B Natriuretic Peptide 58.4 03/22/2020: ALT 15; Hemoglobin 11.6; Platelets 261 03/23/2020: BUN 13; Creatinine, Ser 1.18; Potassium 4.1; Sodium 134    Lipid Panel    Component Value Date/Time   CHOL 166 03/23/2020 1237   TRIG 193 (H) 03/23/2020 1237   HDL 37 (L) 03/23/2020 1237   CHOLHDL 4.5 03/23/2020 1237   VLDL 39 03/23/2020 1237   LDLCALC 90 03/23/2020 1237      Wt Readings from Last 3 Encounters:  07/15/20 180 lb 9.6 oz (81.9 kg)  05/12/20 180 lb (81.6 kg)  04/15/20 181 lb (82.1 kg)      Other studies Reviewed: Additional studies/ records that were reviewed today include:   Left Heart  Catheterization 11/23/2019:  Previously placed Mid LAD to Dist LAD stent (unknown type) is widely patent.  Mid LAD lesion is 30% stenosed.  CULPRIT LESION: Dist LAD-1 lesion is 100% stenosed with 100% stenosed side branch in 3rd Diag.  Unsuccessful balloon angioplasty was performed using a BALLOON SAPPHIRE 2.0X12 -unable to advance into the distal vessel.  Post intervention, there is a 70% residual stenosis followed by Dist LAD-2 lesion is 99% stenosed. Post intervention, the side branch was remained at 100% residual stenosis.  --------------------------  RPDA lesion is 80% stenosed-tapers into very small vessel..  LV end diastolic pressure is severely elevated. Therefore no the ventriculogram was performed.  There is no aortic valve stenosis.  Diagnostic Dominance: Right  Intervention    _____________  Echocardiogram 11/24/2019: Impressions: 1. Left ventricular ejection fraction, by estimation, is 45 to 50%. The  left ventricle has mildly decreased function. The left ventricle has no  regional wall motion abnormalities. Left ventricular diastolic parameters  are consistent with Grade I  diastolic dysfunction (impaired relaxation). Elevated left atrial  pressure.  2. Right ventricular systolic function is normal. The right ventricular  size is normal. There is normal pulmonary artery systolic pressure.  3. The mitral valve is normal in structure. No evidence of mitral valve  regurgitation. No evidence of mitral stenosis.  4. The aortic valve is tricuspid. Aortic valve regurgitation is moderate  to severe. Mild to moderate aortic valve sclerosis/calcification is  present, without any evidence of aortic stenosis.  5. The inferior vena cava is normal in size with greater than 50%  respiratory variability, suggesting right atrial pressure of 3 mmHg.  Echocardiogram 03/2020: 1. LVEF has improved since the prior study on 11/24/2019, from 45-50% to  55-60%. There is  significant LVH predominantly in the apical segments, a  cardiac MRI should be considered to evaluate for possible infiltrative and  hypertrophic cardiomyopathies.  2. Left ventricular ejection fraction, by estimation, is 55 to 60%. The  left ventricle has normal function. The left ventricle has no regional  wall motion abnormalities. There is moderate concentric left ventricular  hypertrophy. Left ventricular  diastolic parameters are consistent with Grade I diastolic dysfunction  (impaired relaxation). Elevated left atrial pressure.  3. Right ventricular systolic function  is normal. The right ventricular  size is normal.  4. The mitral valve is normal in structure. Mild mitral valve  regurgitation. No evidence of mitral stenosis.  5. The aortic valve is tricuspid. Aortic valve regurgitation is moderate.  Mild to moderate aortic valve sclerosis/calcification is present, without  any evidence of aortic stenosis.  6. The inferior vena cava is normal in size with greater than 50%  respiratory variability, suggesting right atrial pressure of 3 mmHg.      ASSESSMENT AND PLAN:  1. CAD s/p STEMI 11/2019 with PCI to dLAD with prior PCI/DES to mLAD in 2017: she continues to have chest pain on a weekly basis. It is nitro responsive to 1 SL nitro. Still with ongoing concern for medication non-compliance. She has missed doses of aspirin and brilinta on occasion, sometimes up to a week at a time. Her last cath showed small vessel distal disease in the RCA and LAD otherwise no high grade stenosis. BP, LDL, and A1C remain above goal and she continues to smoke. Possible she has had some progression. Recent echo 03/2020 showed improvement in EF to 55-60% with no RWMA. EKG today is non-ischemic. Feel she would be a poor candidate for invasive ischemic evaluation given ongoing non-compliance. - Will check a NST to further evaluate chest pain and r/o high risk ischemia. The risks [chest pain, shortness of  breath, cardiac arrhythmias, dizziness, blood pressure fluctuations, myocardial infarction, stroke/transient ischemic attack, nausea, vomiting, allergic reaction, radiation exposure, metallic taste sensation and life-threatening complications (estimated to be 1 in 10,000)], benefits (risk stratification, diagnosing coronary artery disease, treatment guidance) and alternatives of a nuclear stress test were discussed in detail with Ms. Fincher and she agrees to proceed. - Continue aspirin and brilinta - Continue statin - Will increase imdur to 90mg  daily - Will increase carvedilol to 25mg  BID - Rx for 90 day supplies resubmitted to improve compliance  2. Chronic combined CHF/ischemic cardiomyopathy: EF 45-50% at the time of her STEMI 11/2019 with improvement in EF to 55-60% 03/2020 at the time of her CVA. No volume overload complaints and she appears euvolemic - Continue carvedilol, losartan, and imdur  3. LV hypertrophy: noted to have moderate LVH on echo 03/2020 - recommended for a cardiac MR to evaluate for infiltrative vs hypertrophic cardiomyopathy - Will discuss this further at her follow-up visit  4. HTN: BP 140/84 today.  - Instructed her to take carvedilol 12.5mg  BID - will consider further titration of additional medications if BP remains above goal at follow-up - Continue losartan and imdur  5. HLD: LDL 90 03/2020; goal <70; Referred to lipid clinic at last visit and recommended for repatha, though does not appear this has been started - Continue atorvastatin - Will refer to the lipid clinic for PSK9-inhibitor consideration given LDL above goal despite max statin therapyy  6. DM type 2: A1C 9.1 03/2020, remains above goal despite insulin use - Will start jardiance 10mg  daily. Plan to repeat BMET at follow-up in 2 weeks.  - Continue insulin - Encouraged her to follow-up with her PCP given recent medication changes  7. Suspected OSA: she reports daytime somnolence, PND, and husband  reports snoring and apneic episodes. She is scheduled for a sleep study within the month - Encouraged her to complete the sleep study as her symptoms sound c/f OSA  8. Tobacco abuse: still smoking 1/2 ppd. Both she and her husband smoke. Had a long conversation with them regarding health risks of continued use. Encouraged them to  quit together - Continue to encourage cessation  9. Cocaine abuse: denies recent use - Continue to encourage abstinence   Current medicines are reviewed at length with the patient today.  The patient does not have concerns regarding medicines.  The following changes have been made:  As above  Labs/ tests ordered today include:   Orders Placed This Encounter  Procedures  . Cardiac Stress Test: Informed Consent Details: Physician/Practitioner Attestation; Transcribe to consent form and obtain patient signature  . MYOCARDIAL PERFUSION IMAGING     Disposition:   FU with me in 1 month  Signed, Beatriz Stallion, PA-C  07/17/2020 4:30 PM

## 2020-07-15 ENCOUNTER — Encounter: Payer: Self-pay | Admitting: Medical

## 2020-07-15 ENCOUNTER — Ambulatory Visit (INDEPENDENT_AMBULATORY_CARE_PROVIDER_SITE_OTHER): Payer: Self-pay | Admitting: Medical

## 2020-07-15 ENCOUNTER — Other Ambulatory Visit: Payer: Self-pay

## 2020-07-15 VITALS — BP 140/84 | HR 94 | Ht 62.0 in | Wt 180.6 lb

## 2020-07-15 DIAGNOSIS — I2510 Atherosclerotic heart disease of native coronary artery without angina pectoris: Secondary | ICD-10-CM

## 2020-07-15 DIAGNOSIS — E785 Hyperlipidemia, unspecified: Secondary | ICD-10-CM

## 2020-07-15 DIAGNOSIS — I5042 Chronic combined systolic (congestive) and diastolic (congestive) heart failure: Secondary | ICD-10-CM

## 2020-07-15 DIAGNOSIS — Z720 Tobacco use: Secondary | ICD-10-CM

## 2020-07-15 DIAGNOSIS — I255 Ischemic cardiomyopathy: Secondary | ICD-10-CM

## 2020-07-15 DIAGNOSIS — F1410 Cocaine abuse, uncomplicated: Secondary | ICD-10-CM

## 2020-07-15 DIAGNOSIS — R072 Precordial pain: Secondary | ICD-10-CM

## 2020-07-15 DIAGNOSIS — I10 Essential (primary) hypertension: Secondary | ICD-10-CM

## 2020-07-15 DIAGNOSIS — I517 Cardiomegaly: Secondary | ICD-10-CM

## 2020-07-15 DIAGNOSIS — E119 Type 2 diabetes mellitus without complications: Secondary | ICD-10-CM

## 2020-07-15 MED ORDER — EMPAGLIFLOZIN 10 MG PO TABS
10.0000 mg | ORAL_TABLET | Freq: Every day | ORAL | 3 refills | Status: DC
Start: 2020-07-15 — End: 2021-09-29

## 2020-07-15 MED ORDER — NITROGLYCERIN 0.4 MG SL SUBL: 0.4000 mg | SUBLINGUAL_TABLET | SUBLINGUAL | 2 refills | Status: DC | PRN

## 2020-07-15 MED ORDER — CARVEDILOL 25 MG PO TABS
25.0000 mg | ORAL_TABLET | Freq: Two times a day (BID) | ORAL | 3 refills | Status: DC
Start: 2020-07-15 — End: 2021-09-29

## 2020-07-15 MED ORDER — ISOSORBIDE MONONITRATE ER 60 MG PO TB24
90.0000 mg | ORAL_TABLET | Freq: Every day | ORAL | 3 refills | Status: DC
Start: 2020-07-15 — End: 2021-01-26

## 2020-07-15 MED ORDER — ATORVASTATIN CALCIUM 80 MG PO TABS
80.0000 mg | ORAL_TABLET | Freq: Every day | ORAL | 3 refills | Status: DC
Start: 2020-07-15 — End: 2021-02-05

## 2020-07-15 MED ORDER — TICAGRELOR 90 MG PO TABS
90.0000 mg | ORAL_TABLET | Freq: Two times a day (BID) | ORAL | 3 refills | Status: DC
Start: 2020-07-15 — End: 2020-09-26

## 2020-07-15 MED ORDER — LOSARTAN POTASSIUM 25 MG PO TABS: 25.0000 mg | ORAL_TABLET | Freq: Every day | ORAL | 3 refills | Status: DC

## 2020-07-15 NOTE — Patient Instructions (Signed)
Medication Instructions:   INCREASE Coreg to 25 mg 2 times a day   INCREASE Imdur to 90 mg (1.5 tablets) daily  *If you need a refill on your cardiac medications before your next appointment, please call your pharmacy*  Lab Work: NONE ordered at this time of appointment   If you have labs (blood work) drawn today and your tests are completely normal, you will receive your results only by:  MyChart Message (if you have MyChart) OR  A paper copy in the mail If you have any lab test that is abnormal or we need to change your treatment, we will call you to review the results.  Testing/Procedures: Your physician has requested that you have a lexiscan myoview. For further information please visit [email or web address] Please follow instruction sheet, as given.   Please schedule for 2 weeks    Follow-Up: At [facility], you and your health needs are our priority.  As part of our continuing mission to provide you with exceptional heart care, we have created designated Provider Care Teams.  These Care Teams include your primary Cardiologist (physician) and Advanced Practice Providers (APPs -  Physician Assistants and Nurse Practitioners) who all work together to provide you with the care you need, when you need it.  We recommend signing up for the patient portal called "MyChart".  Sign up information is provided on this After Visit Summary.  MyChart is used to connect with patients for Virtual Visits (Telemedicine).  Patients are able to view lab/test results, encounter notes, upcoming appointments, etc.  Non-urgent messages can be sent to your provider as well.   To learn more about what you can do with MyChart, go to [email or web address].    Your next appointment:   08/13/2020   The format for your next appointment:   In Person  Provider:   Judy Pimple, PA-C  Other Instructions

## 2020-07-17 ENCOUNTER — Encounter: Payer: Self-pay | Admitting: Medical

## 2020-07-21 NOTE — Addendum Note (Signed)
Addended by: Joselyn Arrow on: 07/21/2020 09:36 AM   Modules accepted: Orders

## 2020-07-23 ENCOUNTER — Telehealth (HOSPITAL_COMMUNITY): Payer: Self-pay | Admitting: *Deleted

## 2020-07-23 NOTE — Telephone Encounter (Signed)
Close encounter 

## 2020-07-29 ENCOUNTER — Ambulatory Visit (HOSPITAL_COMMUNITY): Admission: RE | Admit: 2020-07-29 | Payer: Self-pay | Source: Ambulatory Visit | Attending: Medical | Admitting: Medical

## 2020-08-06 ENCOUNTER — Ambulatory Visit (HOSPITAL_COMMUNITY): Admission: RE | Admit: 2020-08-06 | Payer: Self-pay | Source: Ambulatory Visit | Attending: Medical | Admitting: Medical

## 2020-08-07 NOTE — Addendum Note (Signed)
Addended by: Judy Pimple on: 08/07/2020 02:03 PM   Modules accepted: Orders

## 2020-08-13 ENCOUNTER — Telehealth (HOSPITAL_COMMUNITY): Payer: Self-pay | Admitting: *Deleted

## 2020-08-13 ENCOUNTER — Ambulatory Visit: Payer: Self-pay | Admitting: Medical

## 2020-08-13 NOTE — Telephone Encounter (Signed)
Close encounter 

## 2020-08-14 ENCOUNTER — Encounter: Payer: Self-pay | Admitting: *Deleted

## 2020-08-14 ENCOUNTER — Other Ambulatory Visit: Payer: Self-pay

## 2020-08-14 ENCOUNTER — Ambulatory Visit (HOSPITAL_COMMUNITY)
Admission: RE | Admit: 2020-08-14 | Discharge: 2020-08-14 | Disposition: A | Discharge status: Home / Self Care | Payer: Self-pay | Source: Ambulatory Visit | Attending: Cardiology | Admitting: Cardiology

## 2020-08-14 DIAGNOSIS — R072 Precordial pain: Secondary | ICD-10-CM

## 2020-08-20 ENCOUNTER — Inpatient Hospital Stay (HOSPITAL_COMMUNITY): Admission: RE | Admit: 2020-08-20 | Payer: Self-pay | Source: Ambulatory Visit

## 2020-08-22 ENCOUNTER — Ambulatory Visit: Payer: Self-pay | Admitting: Medical

## 2020-08-22 ENCOUNTER — Telehealth (HOSPITAL_COMMUNITY): Payer: Self-pay | Admitting: *Deleted

## 2020-08-22 NOTE — Telephone Encounter (Signed)
Close encounter 

## 2020-08-27 ENCOUNTER — Ambulatory Visit (HOSPITAL_COMMUNITY)
Admission: RE | Admit: 2020-08-27 | Discharge: 2020-08-27 | Disposition: A | Discharge status: Home / Self Care | Payer: Self-pay | Source: Ambulatory Visit | Attending: Cardiovascular Disease | Admitting: Cardiovascular Disease

## 2020-08-27 ENCOUNTER — Other Ambulatory Visit: Payer: Self-pay

## 2020-08-27 DIAGNOSIS — R072 Precordial pain: Secondary | ICD-10-CM | POA: Insufficient documentation

## 2020-08-27 HISTORY — PX: NM MYOVIEW LTD: HXRAD82

## 2020-08-27 LAB — MYOCARDIAL PERFUSION IMAGING
LV dias vol: 110 mL (ref 46–106)
LV sys vol: 51 mL
Peak HR: 89 {beats}/min
Rest HR: 72 {beats}/min
SDS: 4
SRS: 10
SSS: 14
TID: 1

## 2020-08-27 MED ORDER — REGADENOSON 0.4 MG/5ML IV SOLN
0.4000 mg | Freq: Once | INTRAVENOUS | Status: AC
  Administered 2020-08-27: 0.4 mg via INTRAVENOUS

## 2020-08-27 MED ORDER — TECHNETIUM TC 99M TETROFOSMIN IV KIT
30.0000 | PACK | Freq: Once | INTRAVENOUS | Status: AC | PRN
  Administered 2020-08-27: 30 via INTRAVENOUS
  Filled 2020-08-27: qty 30

## 2020-08-27 MED ORDER — TECHNETIUM TC 99M TETROFOSMIN IV KIT
9.7000 | PACK | Freq: Once | INTRAVENOUS | Status: AC | PRN
  Administered 2020-08-27: 9.7 via INTRAVENOUS
  Filled 2020-08-27: qty 10

## 2020-08-29 ENCOUNTER — Encounter (HOSPITAL_BASED_OUTPATIENT_CLINIC_OR_DEPARTMENT_OTHER): Payer: Self-pay | Admitting: Internal Medicine

## 2020-09-10 ENCOUNTER — Ambulatory Visit: Payer: Self-pay | Admitting: Medical

## 2020-09-22 ENCOUNTER — Ambulatory Visit: Payer: Self-pay | Admitting: Medical

## 2020-09-22 NOTE — Progress Notes (Deleted)
Cardiology Office Note   Date:  09/22/2020   ID:  Maria Harper, DOB November 10, 1970, MRN [id number]  PCP:  Maria Sharps, NP  Cardiologist:  Maria Lemma, MD EP: None  No chief complaint on file.     History of Present Illness: Maria Harper is a 50 y.o. female with a PMH of CAD s/p STEMI 11/2019 with previous PCI/DES LAD in 2017, chronic combined CHF/ICM, HTN, HLD, DM type 2, GERD, cocaine abuse, tobacco abuse, and recent CVA 03/2020, who presents for routine follow-up.   She was hospitalization for an acute CVA 03/2020. Echo that admission showed improvement in EF to 55-60%, moderate concentric LVH (consider cardiac MRI to evaluate for possible infiltrative and hypertrophic cardiomyopathies), G1DD, and mild MR. Carotid dopplers did not reveal significant stenosis. Prior to this she had a STEMI 11/2019 with 100% dLAD stenosis and 100% 3rd diagonal stenosis though unsuccessful balloon angioplasty due to inability to advance into distal vessel, dLAD lesion was reduced to 70% stenosis after PCI attempts. Echo at the time of her MI showed EF 45-50%. At her last visit she reported occasional chest pain, DOE and symptoms c/f OSA. She was not compliant with her GDMT for CAD or CHF. She had significant small vessel disease noted on her LHC and decision made to focus on medical management by increasing imdur to 60mg  daily and carvedilol to 12.5mg  BID. She was strongly encouraged to complete her previously ordered sleep study, though it appears this still has not been done.   She was seen for follow-up 07/15/20. She continued to decline physically since her last visit. Memory trouble limits history taking. She completed a stent in rehab, however since returning home has struggled with ongoing weakness and gait instability resulting in several falls. Thankfully no serious injury has resulted from these. Additionally she has continued to have intermittent chest pain. These are often non-exertional in  nature, occurring once a week, and generally relieved with SL nitro. She was working on cutting back smoking, currently down to 4 cigarettes a day. No complaints of LE edema, orthopnea, dizziness, lightheadedness, or syncope, and no change in chronic DOE. She has struggled with medication compliance, often missing a weeks worth of medications due to lapse in refills. Rx for 90 day supplies was sent to limit interruption in therapy. Carvedilol and imdur were increased at that time. She was recommended to undergo a stress test to further evaluate her chest pain which unfortunately was a poor study due to severe extracardiac tracer uptake in the gut making it difficult to differentiate ischemia from artifact, though EF was estimated to be 53%.   1. CAD s/p STEMI 11/2019 with PCI to dLAD with prior PCI/DES to mLAD in 2017:she continues to have chest pain on a weekly basis. It is nitro responsive to 1 SL nitro. Still with ongoing concern for medication non-compliance. She has missed doses of aspirin and brilinta on occasion, sometimes up to a week at a time.Her last cath showed small vessel distal disease in the RCA and LAD otherwise no high grade stenosis. BP, LDL, and A1C remain above goal and she continues to smoke. Possible she has had some progression. Recent echo 03/2020 showed improvement in EF to 55-60% with no RWMA. EKG today is non-ischemic. Feel she would be a poor candidate for invasive ischemic evaluation given ongoing non-compliance. Unfortunately NST was a poor study due to severe extracardiac tracer uptake in the gut.  - Continue apirin and brilinta - Continue statin - Continue  imdur and carvedilol  2. Chronic combined CHF/ischemic cardiomyopathy:EF 45-50% at the time of her STEMI 11/2019 with improvement in EF to 55-60% 03/2020 at the time of her CVA. No volume overload complaints and she appears euvolemic - Continuecarvedilol, losartan, and imdur  3. LV hypertrophy:noted to have moderate  LVH on echo 03/2020 - recommended for a cardiac MR to evaluate for infiltrative vs hypertrophic cardiomyopathy - Will discuss this further at her follow-up visit***  4. HTN:BP *** today.  - Continue carvedilol, losartan, and imdur  5. HLD:LDL 90 03/2020; goal <70; Referred to lipid clinic at last visit and recommended for repatha, though does not appear this has been started - Continueatorvastatin  6. DM type 2:A1C 9.1 03/2020, remains above goal despite insulin use. She was started on jardiance at her last visit and recommended for repeat BMET in 2 weeks, though does not appear she had blood work done.  - Will repeat BMET today for monitoring of Cr.  - Continueinsulin and jardiance - Encouraged her to follow-up with her PCP given recent medication changes  7.Suspected OSA:she reports daytime somnolence, PND, and husband reports snoring and apneic episodes. She is scheduled for a sleep study within the month - Encouraged her to complete the sleep study as her symptoms sound c/f OSA  8.Tobacco abuse:still smoking 1/2 ppd. Both she and her husband smoke. Had a long conversation with them regarding health risks of continued use. Encouraged them to quit together - Continue to encourage cessation  9. Cocaine abuse:denies recent use - Continue to encourage abstinence  Past Medical History:  Diagnosis Date  . Acid reflux   . Cocaine abuse (HCC)   . Coronary artery disease    s/p STEMI 11/2019; PCI/DES to mLAD in 2017 and PCI/DES to dLAD in 11/2019  . Diabetes mellitus without complication (HCC)   . Heart attack (HCC) 04/2016  . High cholesterol   . Hypertension   . Ischemic cardiomyopathy 11/2019  . Tobacco abuse     Past Surgical History:  Procedure Laterality Date  . CORONARY/GRAFT ACUTE MI REVASCULARIZATION N/A 11/23/2019   Procedure: CORONARY/GRAFT ACUTE MI REVASCULARIZATION;  Surgeon: [name], MD;  Location: [facility] INVASIVE CV LAB;  Service: Cardiovascular;   Laterality: N/A;  . LEFT HEART CATH AND CORONARY ANGIOGRAPHY N/A 11/23/2019   Procedure: LEFT HEART CATH AND CORONARY ANGIOGRAPHY;  Surgeon: [name], MD;  Location: [facility] INVASIVE CV LAB;  Service: Cardiovascular;  Laterality: N/A;     Current Outpatient Medications  Medication Sig Dispense Refill  . albuterol (VENTOLIN HFA) 108 (90 Base) MCG/ACT inhaler Inhale 2 puffs into the lungs every 6 (six) hours as needed for wheezing or shortness of breath.    Marland Kitchen aspirin 81 MG chewable tablet Chew 81 mg by mouth daily.    Marland Kitchen atorvastatin (LIPITOR) 80 MG tablet Take 1 tablet (80 mg total) by mouth daily at 6 PM. 90 tablet 3  . carvedilol (COREG) 25 MG tablet Take 1 tablet (25 mg total) by mouth 2 (two) times daily with a meal. 180 tablet 3  . cyclobenzaprine (FLEXERIL) 10 MG tablet Take 10 mg by mouth 3 (three) times daily as needed for muscle spasms.     . empagliflozin (JARDIANCE) 10 MG TABS tablet Take 1 tablet (10 mg total) by mouth daily before breakfast. 90 tablet 3  . gabapentin (NEURONTIN) 300 MG capsule Take 300 mg by mouth 3 (three) times daily.    Marland Kitchen HUMULIN R 100 UNIT/ML injection Inject 17 Units into the  skin 2 (two) times daily before a meal.   0  . insulin NPH-regular Human (70-30) 100 UNIT/ML injection Inject 35 Units into the skin 2 (two) times daily.     . isosorbide mononitrate (IMDUR) 60 MG 24 hr tablet Take 1.5 tablets (90 mg total) by mouth daily. 135 tablet 3  . loratadine (CLARITIN) 10 MG tablet Take 10 mg by mouth daily.    Marland Kitchen losartan (COZAAR) 25 MG tablet Take 1 tablet (25 mg total) by mouth daily. 90 tablet 3  . nitroGLYCERIN (NITROSTAT) 0.4 MG SL tablet Place 1 tablet (0.4 mg total) under the tongue as needed for chest pain. 25 tablet 2  . pantoprazole (PROTONIX) 40 MG tablet Take 40 mg by mouth 2 (two) times daily.     . polyethylene glycol powder (GLYCOLAX/MIRALAX) powder Take 17 g by mouth daily as needed for moderate constipation.     . QUEtiapine (SEROQUEL) 200 MG  tablet Take 200 mg by mouth daily.     . QUEtiapine (SEROQUEL) 400 MG tablet Take 400 mg by mouth at bedtime.    . ticagrelor (BRILINTA) 90 MG TABS tablet Take 1 tablet (90 mg total) by mouth 2 (two) times daily. 180 tablet 3   No current facility-administered medications for this visit.    Allergies:   Bee venom, Contrast media [iodinated diagnostic agents], and Tomato    Social History:  The patient  reports that she has been smoking cigarettes. She has a 30.00 pack-year smoking history. She has never used smokeless tobacco. She reports previous alcohol use. She reports current drug use. Drug: "Crack" cocaine.   Family History:  The patient's ***family history includes Diabetes in her father, maternal grandfather, maternal grandmother, and mother.    ROS:  Please see the history of present illness.   Otherwise, review of systems are positive for {NONE DEFAULTED:18576::"none"}.   All other systems are reviewed and negative.    PHYSICAL EXAM: VS:  There were no vitals taken for this visit. , BMI There is no height or weight on file to calculate BMI. GEN: Well nourished, well developed, in no acute distress HEENT: normal Neck: no JVD, carotid bruits, or masses Cardiac: ***RRR; no murmurs, rubs, or gallops,no edema  Respiratory:  clear to auscultation bilaterally, normal work of breathing GI: soft, nontender, nondistended, + BS MS: no deformity or atrophy Skin: warm and dry, no rash Neuro:  Strength and sensation are intact Psych: euthymic mood, full affect   EKG:  EKG {ACTION; IS/IS NWG:[id number]} ordered today. The ekg ordered today demonstrates ***   Recent Labs: 03/22/2020: ALT 15; Hemoglobin 11.6; Platelets 261 03/23/2020: BUN 13; Creatinine, Ser 1.18; Potassium 4.1; Sodium 134    Lipid Panel    Component Value Date/Time   CHOL 166 03/23/2020 1237   TRIG 193 (H) 03/23/2020 1237   HDL 37 (L) 03/23/2020 1237   CHOLHDL 4.5 03/23/2020 1237   VLDL 39 03/23/2020 1237    LDLCALC 90 03/23/2020 1237      Wt Readings from Last 3 Encounters:  08/27/20 180 lb (81.6 kg)  07/15/20 180 lb 9.6 oz (81.9 kg)  05/12/20 180 lb (81.6 kg)      Other studies Reviewed: Additional studies/ records that were reviewed today include:   Left Heart Catheterization 11/23/2019:  Previously placed Mid LAD to Dist LAD stent (unknown type) is widely patent.  Mid LAD lesion is 30% stenosed.  CULPRIT LESION: Dist LAD-1 lesion is 100% stenosed with 100% stenosed side branch in 3rd Diag.  Unsuccessful balloon angioplasty was performed using a BALLOON SAPPHIRE 2.0X12 -unable to advance into the distal vessel.  Post intervention, there is a 70% residual stenosis followed by Dist LAD-2 lesion is 99% stenosed. Post intervention, the side branch was remained at 100% residual stenosis.  --------------------------  RPDA lesion is 80% stenosed-tapers into very small vessel..  LV end diastolic pressure is severely elevated. Therefore no the ventriculogram was performed.  There is no aortic valve stenosis.  Diagnostic Dominance: Right  Intervention    _____________  Echocardiogram 11/24/2019: Impressions: 1. Left ventricular ejection fraction, by estimation, is 45 to 50%. The  left ventricle has mildly decreased function. The left ventricle has no  regional wall motion abnormalities. Left ventricular diastolic parameters  are consistent with Grade I  diastolic dysfunction (impaired relaxation). Elevated left atrial  pressure.  2. Right ventricular systolic function is normal. The right ventricular  size is normal. There is normal pulmonary artery systolic pressure.  3. The mitral valve is normal in structure. No evidence of mitral valve  regurgitation. No evidence of mitral stenosis.  4. The aortic valve is tricuspid. Aortic valve regurgitation is moderate  to severe. Mild to moderate aortic valve sclerosis/calcification is  present, without any evidence of  aortic stenosis.  5. The inferior vena cava is normal in size with greater than 50%  respiratory variability, suggesting right atrial pressure of 3 mmHg.  Echocardiogram 03/2020: 1. LVEF has improved since the prior study on 11/24/2019, from 45-50% to  55-60%. There is significant LVH predominantly in the apical segments, a  cardiac MRI should be considered to evaluate for possible infiltrative and  hypertrophic cardiomyopathies.  2. Left ventricular ejection fraction, by estimation, is 55 to 60%. The  left ventricle has normal function. The left ventricle has no regional  wall motion abnormalities. There is moderate concentric left ventricular  hypertrophy. Left ventricular  diastolic parameters are consistent with Grade I diastolic dysfunction  (impaired relaxation). Elevated left atrial pressure.  3. Right ventricular systolic function is normal. The right ventricular  size is normal.  4. The mitral valve is normal in structure. Mild mitral valve  regurgitation. No evidence of mitral stenosis.  5. The aortic valve is tricuspid. Aortic valve regurgitation is moderate.  Mild to moderate aortic valve sclerosis/calcification is present, without  any evidence of aortic stenosis.  6. The inferior vena cava is normal in size with greater than 50%  respiratory variability, suggesting right atrial pressure of 3 mmHg.      ASSESSMENT AND PLAN:  1.  ***   Current medicines are reviewed at length with the patient today.  The patient {ACTIONS; HAS/DOES NOT HAVE:19233} concerns regarding medicines.  The following changes have been made:  {PLAN; NO CHANGE:13088:s}  Labs/ tests ordered today include: *** No orders of the defined types were placed in this encounter.    Disposition:   FU with *** in {gen number 6-06:301601} {Days to years:10300}  Signed, Beatriz Stallion, PA-C  09/22/2020 7:49 AM

## 2020-09-26 ENCOUNTER — Other Ambulatory Visit: Payer: Self-pay

## 2020-09-26 ENCOUNTER — Ambulatory Visit (INDEPENDENT_AMBULATORY_CARE_PROVIDER_SITE_OTHER): Payer: Self-pay | Admitting: Cardiology

## 2020-09-26 VITALS — BP 130/72 | HR 80 | Ht 62.0 in | Wt 179.6 lb

## 2020-09-26 DIAGNOSIS — I639 Cerebral infarction, unspecified: Secondary | ICD-10-CM

## 2020-09-26 DIAGNOSIS — Z720 Tobacco use: Secondary | ICD-10-CM

## 2020-09-26 DIAGNOSIS — I25119 Atherosclerotic heart disease of native coronary artery with unspecified angina pectoris: Secondary | ICD-10-CM

## 2020-09-26 DIAGNOSIS — I5042 Chronic combined systolic (congestive) and diastolic (congestive) heart failure: Secondary | ICD-10-CM

## 2020-09-26 DIAGNOSIS — I2109 ST elevation (STEMI) myocardial infarction involving other coronary artery of anterior wall: Secondary | ICD-10-CM

## 2020-09-26 DIAGNOSIS — I10 Essential (primary) hypertension: Secondary | ICD-10-CM

## 2020-09-26 DIAGNOSIS — I2510 Atherosclerotic heart disease of native coronary artery without angina pectoris: Secondary | ICD-10-CM

## 2020-09-26 DIAGNOSIS — F1414 Cocaine abuse with cocaine-induced mood disorder: Secondary | ICD-10-CM

## 2020-09-26 DIAGNOSIS — Z9861 Coronary angioplasty status: Secondary | ICD-10-CM

## 2020-09-26 DIAGNOSIS — I255 Ischemic cardiomyopathy: Secondary | ICD-10-CM

## 2020-09-26 DIAGNOSIS — E785 Hyperlipidemia, unspecified: Secondary | ICD-10-CM

## 2020-09-26 MED ORDER — TICAGRELOR 60 MG PO TABS: 60.0000 mg | ORAL_TABLET | Freq: Two times a day (BID) | ORAL | 3 refills | Status: DC

## 2020-09-26 MED ORDER — AMLODIPINE BESYLATE 5 MG PO TABS: 5.0000 mg | ORAL_TABLET | Freq: Every day | ORAL | 3 refills | Status: DC

## 2020-09-26 MED ORDER — REPATHA SURECLICK 140 MG/ML ~~LOC~~ SOAJ: 140.0000 mg | SUBCUTANEOUS | 4 refills | Status: DC

## 2020-09-26 NOTE — Patient Instructions (Addendum)
Medication Instructions:   Stop losartan  Start Amlodipine 5 mg one tablet daily   Complete current bottle of  Brilinta 90 mg then  Start brilinta 60 mg twice a day  *If you need a refill on your cardiac medications before your next appointment, please call your pharmacy*   Lab Work: Lipid  liver panel If you have labs (blood work) drawn today and your tests are completely normal, you will receive your results only by: Marland Kitchen MyChart Message (if you have MyChart) OR . A paper copy in the mail If you have any lab test that is abnormal or we need to change your treatment, we will call you to review the results.   Testing/Procedures:  Not needed  Follow-Up: At [facility], you and your health needs are our priority.  As part of our continuing mission to provide you with exceptional heart care, we have created designated Provider Care Teams.  These Care Teams include your primary Cardiologist (physician) and Advanced Practice Providers (APPs -  Physician Assistants and Nurse Practitioners) who all work together to provide you with the care you need, when you need it.     Your next appointment:   3 to 4 month(s)  The format for your next appointment:   In Person  Provider:   Bryan Lemma, MD

## 2020-09-26 NOTE — Progress Notes (Signed)
Primary Care Provider: Lavinia Sharps, NP Cardiologist: Maria Lemma, MD Electrophysiologist: None  Clinic Note: Chief Complaint  Patient presents with  . Follow-up    Myoview results, chest pain  . Coronary Artery Disease    Mostly occlusive by last cath.  Stent open but downstream vessel occluded    ===================================  ASSESSMENT/PLAN   Problem List Items Addressed This Visit    Cocaine abuse with cocaine-induced mood disorder (HCC)    She says she is not used any recently.  I explained her that going back to using would be a very very poor decision.      Tobacco abuse    I counseled her on smoking cessation.  Does not seem to be interested in stopping.      Acute CVA (cerebrovascular accident) [facility])    Per neurology, remains on aspirin and Brilinta.      Relevant Medications   Evolocumab (REPATHA SURECLICK) 140 MG/ML SOAJ   Maria elevation myocardial infarction (STEMI) of anterior wall (HCC) (Chronic)    In the distal anterior wall is pretty much not reperfused following the last MI.  Interestingly, there is increased thickness in that region and no obvious wall motion normality on echo.  The Myoview read was a little unusual.  There is definitely what appears to be a fixed/not reversible perfusion defect in the distal anterior apical wall and apex.  This would go along with her known anatomy.  Interestingly though although it appears to be full-thickness, the echo does not show wall motion normality in that distribution.  I personally reviewed the echo and I do think that there is a little bit of apical dyskinesis.  Regardless the apex is very thick and this could be why this is found.  I think her symptoms are probably spasm related-she is has a history of cocaine use and is still smoking.  She has a distal LAD distribution and diagonal branch which are probably getting some collateral flow.      Relevant Medications   Evolocumab (REPATHA SURECLICK)  140 MG/ML SOAJ   CAD S/P percutaneous coronary angioplasty (Chronic)    She is still on aspirin and Brilinta.  We are reducing Brilinta to 60 mg now -> when she completes her current bottle.  Continue aspirin plus Brilinta because of recent stroke.  But okay to hold for procedures (5 days preop) as of May 2021      Relevant Medications   Evolocumab (REPATHA SURECLICK) 140 MG/ML SOAJ   Coronary artery disease involving native coronary artery of native heart with angina pectoris (HCC) (Chronic)    Still having chest pain now her Myoview really did not show any ischemia.  As I discussed in the MI section, I think the large fixed defect seen on my view is consistent with what we would know about her known anatomy and anterior wall infarct.  Her LAD distally was not revascularizable and nor was the distal diagonal branch.  It was stated to reason that there would be an infarct in this distribution.  I suspect her pain is related to spasm and microvascular disease.  Plan:  Continue Imdur 90 mg daily  DC losartan 25 mg and start amlodipine 5 mg daily.  (Not sure why the says it is discontinued)  Continue carvedilol at current dose.  With her body habitus, this is probably max dose.  Continue empagliflozin  Continue aspirin and Brilinta, but okay to reduce to 60 mg daily -> with recent stroke, will  continue aspirin plus Brilinta, but as of May 2021, okay to hold for procedures 5 days preop      Relevant Medications   Evolocumab (REPATHA SURECLICK) 140 MG/ML SOAJ   Hyperlipidemia associated with type 2 diabetes mellitus (HCC) (Chronic)    Supposedly she is still taking her Repatha which was restarted in December.  She should have lipids checked soon.  Continue atorvastatin if tolerated.  I do not know if she was taking it. We will order lipids and chemistry.  Being followed by our Lipid Clinic Pharmacists  She is on empagliflozin (GLP-1 agonist) for diabetes along with insulin 70-30 as well  as meal coverage      Relevant Medications   Evolocumab (REPATHA SURECLICK) 140 MG/ML SOAJ   Essential hypertension (Chronic)    Blood pressure is pretty well controlled.  My plan was to add amlodipine for antianginal benefit and discontinue losartan.      Relevant Medications   Evolocumab (REPATHA SURECLICK) 140 MG/ML SOAJ    Other Visit Diagnoses    Coronary artery disease involving native coronary artery of native heart without angina pectoris    -  Primary   Relevant Medications   Evolocumab (REPATHA SURECLICK) 140 MG/ML SOAJ   Other Relevant Orders   EKG 12-Lead (Completed)   Lipid panel   Hepatic function panel   Ischemic cardiomyopathy       Relevant Medications   Evolocumab (REPATHA SURECLICK) 140 MG/ML SOAJ   Other Relevant Orders   EKG 12-Lead (Completed)   Lipid panel   Hepatic function panel   Chronic combined systolic and diastolic CHF (congestive heart failure) (HCC)       Relevant Medications   Evolocumab (REPATHA SURECLICK) 140 MG/ML SOAJ   Other Relevant Orders   EKG 12-Lead (Completed)   Lipid panel   Hepatic function panel      ===================================  HPI:    Maria Harper is a 50 y.o. female with a PMH below who presents today for 30-month follow-up to discuss results of her Myoview.  Cardiac History  CAD:   PCI/DES LAD in 2018 [facility] [place])  s/p STEMI 11/2019: Widely patent mid to distal LAD stent, distal LAD is 100% stenosed associated with the third diagonal.  Unsuccessful PTCA-unable to cross.  Only 70% residual but occluded D3.  RPDA tapers to a small caliber vessel with 80% stenosis.  Severely elevated LVEDP.  Resolved ICM - Chronic HFpEF => post MI EF 45 to 50%.  GR 1 DD. => EF now improved to 55-60%.moderate LVH  HTN, HLD,   DM type 2,   Cocaine Abuse, Tobacco Abuse,   Recent CVA 03/2020: Left-sided thalamic stroke: MRA showed 70% right supraclinoid ICA and high-grade narrowing of left petrous cavernous junction.   A1c was 9.1, LDL 90.  Neurology recommended continued DAPT.  Maria Harper was last seen on July 15, 2020 by Maria Pimple, PA.  Apparently she has continued to decline physically.  She has memory trouble making it hard for take medications.  She had completed a cardiac rehab but since returning home was struggling with ongoing weakness and gait instability.  Multiple falls.  No serious injuries.  She also had intermittent chest pain.  Nonexertional happening maybe once or twice a week.  Relieved with nitro.  They can happen throughout the with or without exertion.  She does have chronic DOE which does not change.  Down to 4 cigarettes a day.  No CHF symptoms.  Concern for medical nonadherence, she  often misses weeks worth of medications due to lapse in refills.  No PCP-lack of insurance. => Referred for Social Work assistance.  Recent Hospitalizations: None  Reviewed  CV studies:    The following studies were reviewed today: (if available, images/films reviewed: From Epic Chart or Care Everywhere) . April 2021 Cardiac Cath-PTCA (see above) . TTE 11/24/2019-post anterior STEMI: EF mildly reduced 45 to 50%.  GR 1 DD.  Elevated LAP.  No obvious heart WMA.  Moderate-severe AI with mild-moderate Aortic Sclerosis but no Stenosis. . TTE 03/23/2020 (stroke evaluation): EF improved to 55 to 60%.  Significant LVH-notable apical.  Consider cardiac MRI.  Moderate concentric LVH.  GR 1 DD.  Elevated LAP.  Mild to moderate Aortic Sclerosis but no Stenosis.  Moderate AI.   Carotid Dopplers 03/24/2020: Bilateral ICA 1-39%.  Bilateral vertebral arteries normal flow.  Normal bilateral subclavian arteries.  Myoview 08/27/2020 (recurrent chest pain): Poor study due to gut attenuation.  EF estimated 50 and 55%.  No EKG changes.  Large size severe defect in the basal inferior, mid anterior mid inferior, mid inferolateral, apical anterior, apical inferior and apical location either related to artifact due to get  uptake versus large infarct.  In comparison to TTE, EF had normal function with no wall motion normalities which does not correlate to peripheral fusion findings.-Recommended coronary CTA  Interval History:   Maria Harper presents here today for follow-up.  She still notes that she has her stable exertional dyspnea.  She has off-and-on chest pain again is usually at rest or after eating.  Not necessarily with exertion.  She says he takes nitroglycerin maybe twice a week.  She does not really  actually do much in the way of any exertion, but when she does she does more dyspnea than a chest pain.  The dyspnea has not changed.  She denies any real PND orthopnea, but can wake up sometimes with some discomfort in her chest.  CV Review of Symptoms (Summary): positive for - chest pain, dyspnea on exertion and She even gets short of breath simply with talking. negative for - irregular heartbeat, orthopnea, palpitations, paroxysmal nocturnal dyspnea or Syncope or near syncope, TIA/amaurosis fugax, claudication  Minimal residual symptoms from the stroke, mostly some numbness and tingling in the right hand with some pain.  The patient does not have symptoms concerning for COVID-19 infection (fever, chills, cough, or new shortness of breath).   REVIEWED OF SYSTEMS   Review of Systems  Constitutional: Positive for malaise/fatigue. Negative for weight loss.  HENT: Negative for congestion and nosebleeds.   Respiratory: Positive for shortness of breath (Per HPI). Negative for cough.   Gastrointestinal: Negative for abdominal pain, blood in stool, diarrhea and melena.  Genitourinary: Negative for hematuria.  Musculoskeletal: Negative for falls (Despite having poor balance, no falls) and joint pain.  Neurological: Positive for dizziness (Sometimes when she stands up, when her sugars are low, etc.), focal weakness (Right hand, arm associate with numbness.), weakness (Mostly generalized) and headaches.        Very poor balance.  Psychiatric/Behavioral: Positive for memory loss. Negative for depression. The patient is nervous/anxious. The patient does not have insomnia.    I have reviewed and (if needed) personally updated the patient's problem list, medications, allergies, past medical and surgical history, social and family history.   PAST MEDICAL HISTORY   Past Medical History:  Diagnosis Date  . Acid reflux   . Cocaine abuse (HCC)   . Coronary artery disease, occlusive  2017   2017 or 2018-PCI to LAD (mid-distal); -> anterior STEMI April 2021 -> LAD stent patent, however distal LAD at D3 occluded, only able to reduce to 70% stenosis.  Small caliber RPDA with 80% stenosis.  . Diabetes mellitus without complication (HCC)   . Heart attack (HCC) 04/2016  . High cholesterol   . Hypertension   . Ischemic cardiomyopathy 11/2019  . Tobacco abuse     PAST SURGICAL HISTORY   Past Surgical History:  Procedure Laterality Date  . CORONARY/GRAFT ACUTE MI REVASCULARIZATION N/A 11/23/2019   Procedure: CORONARY/GRAFT ACUTE MI REVASCULARIZATION;  Surgeon: Marykay LexHarding, Jhade Berko W, MD;  [facility];  Service: Cardiovascular; unsuccessful PTCA of distal LAD 100%.  O able to restore only minimal flow with 70% stenosis  . LEFT HEART CATH AND CORONARY ANGIOGRAPHY N/A 11/23/2019   Procedure: LEFT HEART CATH AND CORONARY ANGIOGRAPHY;  Surgeon: Marykay LexHarding, Oveta Idris W, MD;  [facility];  Service: Cardiovascular; ANTERIOR STEMI: Widely patent mid to distal LAD stent, distal LAD is 100% stenosed associated with the third diagonal.  (Post PTCA - 70% residual but 100% D3).  RPDA tapers to a small vessel -80% stenosis.  Severely elevated LVEDP  . NM MYOVIEW LTD  08/27/2020   (recurrent chest pain): Poor study due to gut attenuation.  EF estimated 50 and 55%.  No EKG changes.  Large size severe defect in the basal inferior, mid anterior mid inferior, mid inferolateral, apical anterior, apical inferior  and apical location either related to artifact due to get uptake vs large infarct.  COMPARED to TTE (03/2020), EF s/ nl EF & no RWA - contradicts this finding. ->> Cor CTA  . TRANSTHORACIC ECHOCARDIOGRAM  11/24/2019   -post anterior STEMI: EF mildly reduced 45 to 50%.  GR 1 DD.  Elevated LAP.  No obvious heart WMA.  Moderate-severe AI with mild-moderate Aortic Sclerosis but no Stenosis.  . TRANSTHORACIC ECHOCARDIOGRAM  03/23/2020   (stroke evaluation): EF improved to 55 to 60%.  Significant LVH-notable apical.  Consider cardiac MRI.  Moderate concentric LVH.  GR 1 DD.  Elevated LAP.  Mild to moderate Aortic Sclerosis but no Stenosis.  Moderate AI.    April 2021:  Intervention      There is no immunization history on file for this patient.  MEDICATIONS/ALLERGIES   Evolocumab (REPATHA SURECLICK) 140 MG/ML SOAJ  ticagrelor (BRILINTA) 60 MG TABS tablet    acetaminophen (TYLENOL) 500 MG tablet  aspirin 81 MG chewable tablet  atorvastatin (LIPITOR) 80 MG tablet  buPROPion (WELLBUTRIN SR) 100 MG 12 hr tablet  carvedilol (COREG) 25 MG tablet  cephALEXin (KEFLEX) 500 MG capsule  cyclobenzaprine (FLEXERIL) 10 MG tablet  doxepin (SINEQUAN) 50 MG capsule  empagliflozin (JARDIANCE) 10 MG TABS tablet  ferrous sulfate 325 (65 FE) MG tablet  gabapentin (NEURONTIN) 300 MG capsule  HUMULIN R 100 UNIT/ML injection  HYDROcodone-acetaminophen (NORCO/VICODIN) 5-325 MG tablet  insulin NPH-regular Human (70-30) 100 UNIT/ML injection  isosorbide mononitrate (IMDUR) 60 MG 24 hr tablet  lidocaine (LIDODERM) 5 %  loratadine (CLARITIN) 10 MG tablet  losartan (COZAAR) 50 MG tablet  megestrol (MEGACE) 40 MG tablet  nitroGLYCERIN (NITROSTAT) 0.4 MG SL tablet  pantoprazole (PROTONIX) 40 MG tablet  polyethylene glycol powder (GLYCOLAX/MIRALAX) powder  PRESCRIPTION MEDICATION  QUEtiapine (SEROQUEL) 200 MG tablet  QUEtiapine (SEROQUEL) 400 MG tablet  Allergies  Allergen  Reactions  . Bee Venom Anaphylaxis  . Contrast Media [Iodinated Diagnostic Agents] Hives and Itching  . Tomato Itching and  Rash    SOCIAL HISTORY/FAMILY HISTORY   Reviewed in Epic:  Pertinent findings:  Social History   Tobacco Use  . Smoking status: Current Every Day Smoker    Packs/day: 1.50    Years: 20.00    Pack years: 30.00    Types: Cigarettes  . Smokeless tobacco: Never Used  Vaping Use  . Vaping Use: Never used  Substance Use Topics  . Alcohol use: Not Currently  . Drug use: Yes    Types: "Crack" cocaine    Comment: last used yesterday. States " I do it everyday"    Social History   Social History Narrative  . Not on file    OBJCTIVE -PE, EKG, labs   Wt Readings from Last 3 Encounters:  09/26/20 179 lb 9.6 oz (81.5 kg)  08/27/20 180 lb (81.6 kg)  07/15/20 180 lb 9.6 oz (81.9 kg)    Physical Exam: BP 130/72 (BP Location: Left Arm, Patient Position: Sitting, Cuff Size: Large)   Pulse 80   Ht 5\' 2"  (1.575 m)   Wt 179 lb 9.6 oz (81.5 kg)   BMI 32.85 kg/m  Physical Exam Constitutional:      Appearance: Normal appearance. She is obese.  HENT:     Head: Normocephalic and atraumatic.  Neck:     Vascular: Carotid bruit (Cannot exclude soft bruit versus radiated aortic murmur) present.  Cardiovascular:     Rate and Rhythm: Normal rate and regular rhythm.     Pulses: Normal pulses.     Heart sounds: Murmur (1-2/6 SEM at RUSB.--Neck) heard.  No friction rub. No gallop.   Pulmonary:     Effort: Pulmonary effort is normal. No respiratory distress.     Breath sounds: No rhonchi.     Comments: Mild diffuse crackles with no obvious wheezing. Chest:     Chest wall: No tenderness.  Musculoskeletal:     Cervical back: Normal range of motion and neck supple.  Neurological:     General: No focal deficit present.     Mental Status: She is alert and oriented to person, place, and time.  Psychiatric:        Mood and Affect: Mood normal.        Behavior:  Behavior normal.        Thought Content: Thought content normal.        Judgment: Judgment normal.      Adult ECG Report  Rate: 80;  Rhythm: normal sinus rhythm and Left atrial enlargement; anterior lateral MI, age-indeterminate.  Normal axis, intervals and durations;   Narrative Interpretation: Stable EKG.  Recent Labs: Reviewed Lab Results  Component Value Date   CHOL 166 03/23/2020   HDL 37 (L) 03/23/2020   LDLCALC 90 03/23/2020   TRIG 193 (H) 03/23/2020   CHOLHDL 4.5 03/23/2020   Lab Results  Component Value Date   CREATININE 1.33 (H) 10/19/2020   BUN 19 10/19/2020   NA 136 10/19/2020   K 4.1 10/19/2020   CL 105 10/19/2020   CO2 19 (L) 10/19/2020   CBC Latest Ref Rng & Units 10/19/2020 10/08/2020 03/22/2020  WBC 4.0 - 10.5 K/uL 8.5 6.3 -  Hemoglobin 12.0 - 15.0 g/dL 9.6(E) 11.5(L) 11.6(L)  Hematocrit 36.0 - 46.0 % 26.7(L) 35.8(L) 34.0(L)  Platelets 150 - 400 K/uL 335 198 -    No results found for: TSH  ==================================================  COVID-19 Education: The signs and symptoms of COVID-19 were discussed with the patient and how to seek care for testing (  follow up with PCP or arrange E-visit).   The importance of social distancing and COVID-19 vaccination was discussed today. The patient is practicing social distancing & Masking.   I spent a total of with the patient spent in direct patient consultation.  Additional time spent with chart review  / charting (studies, outside notes, etc): 35 min => This the first time of seeing the patient since her STEMI with several studies to review, medications that are difficult to determine if she is taking them or not.  She has been seen by several providers besides me though notes had to be reviewed. Total Time: 62 min   Current medicines are reviewed at length with the patient today.  (+/- concerns) still has issues with medications.  Needs social work  This visit occurred during the SARS-CoV-2  public health emergency.  Safety protocols were in place, including screening questions prior to the visit, additional usage of staff PPE, and extensive cleaning of exam room while observing appropriate contact time as indicated for disinfecting solutions.  Notice: This dictation was prepared with Dragon dictation along with smaller phrase technology. Any transcriptional errors that result from this process are unintentional and may not be corrected upon review.  Patient Instructions / Medication Changes & Studies & Tests Ordered   Patient Instructions  Medication Instructions:   Stop losartan  Start Amlodipine 5 mg one tablet daily   Complete current bottle of  Brilinta 90 mg then  Start brilinta 60 mg twice a day  *If you need a refill on your cardiac medications before your next appointment, please call your pharmacy*   Lab Work: Lipid  liver panel If you have labs (blood work) drawn today and your tests are completely normal, you will receive your results only by: Marland Kitchen MyChart Message (if you have MyChart) OR . A paper copy in the mail If you have any lab test that is abnormal or we need to change your treatment, we will call you to review the results.   Testing/Procedures:  Not needed  Follow-Up: At [facility], you and your health needs are our priority.  As part of our continuing mission to provide you with exceptional heart care, we have created designated Provider Care Teams.  These Care Teams include your primary Cardiologist (physician) and Advanced Practice Providers (APPs -  Physician Assistants and Nurse Practitioners) who all work together to provide you with the care you need, when you need it.     Your next appointment:   3 to 4 month(s)  The format for your next appointment:   In Person  Provider:   Bryan Lemma, MD    Studies Ordered:   Orders Placed This Encounter  Procedures  . Lipid panel  . Hepatic function panel  . EKG 12-Lead     Maria Harper, M.D., M.S. Interventional Cardiologist   Pager # [phone] Phone # [phone] [address] [place], [place] [address]   Thank you for choosing Heartcare at [facility]!!

## 2020-09-30 ENCOUNTER — Encounter (HOSPITAL_BASED_OUTPATIENT_CLINIC_OR_DEPARTMENT_OTHER): Payer: Self-pay | Admitting: Internal Medicine

## 2020-10-08 ENCOUNTER — Other Ambulatory Visit: Payer: Self-pay

## 2020-10-08 ENCOUNTER — Encounter (HOSPITAL_COMMUNITY): Payer: Self-pay | Admitting: *Deleted

## 2020-10-08 ENCOUNTER — Emergency Department (HOSPITAL_COMMUNITY)
Admission: EM | Admit: 2020-10-08 | Discharge: 2020-10-08 | Disposition: A | Discharge status: Home / Self Care | Payer: Self-pay | Attending: Emergency Medicine | Admitting: Emergency Medicine

## 2020-10-08 ENCOUNTER — Emergency Department (HOSPITAL_COMMUNITY): Payer: Self-pay

## 2020-10-08 DIAGNOSIS — Z7984 Long term (current) use of oral hypoglycemic drugs: Secondary | ICD-10-CM | POA: Insufficient documentation

## 2020-10-08 DIAGNOSIS — I25119 Atherosclerotic heart disease of native coronary artery with unspecified angina pectoris: Secondary | ICD-10-CM | POA: Insufficient documentation

## 2020-10-08 DIAGNOSIS — Z79899 Other long term (current) drug therapy: Secondary | ICD-10-CM | POA: Insufficient documentation

## 2020-10-08 DIAGNOSIS — F17210 Nicotine dependence, cigarettes, uncomplicated: Secondary | ICD-10-CM | POA: Insufficient documentation

## 2020-10-08 DIAGNOSIS — E119 Type 2 diabetes mellitus without complications: Secondary | ICD-10-CM | POA: Insufficient documentation

## 2020-10-08 DIAGNOSIS — Z794 Long term (current) use of insulin: Secondary | ICD-10-CM | POA: Insufficient documentation

## 2020-10-08 DIAGNOSIS — R109 Unspecified abdominal pain: Secondary | ICD-10-CM | POA: Insufficient documentation

## 2020-10-08 DIAGNOSIS — Z7982 Long term (current) use of aspirin: Secondary | ICD-10-CM | POA: Insufficient documentation

## 2020-10-08 DIAGNOSIS — I10 Essential (primary) hypertension: Secondary | ICD-10-CM | POA: Insufficient documentation

## 2020-10-08 LAB — URINALYSIS, ROUTINE W REFLEX MICROSCOPIC
Bilirubin Urine: NEGATIVE
Glucose, UA: >=500 mg/dL — AB
Ketones, ur: NEGATIVE mg/dL
Leukocytes,Ua: NEGATIVE
Nitrite: POSITIVE — AB
Protein, ur: 30 mg/dL — AB
RBC / HPF: >50 RBC/hpf — ABNORMAL HIGH (ref 0–5)
Specific Gravity, Urine: 1.023 (ref 1.005–1.030)
pH: 6 (ref 5.0–8.0)

## 2020-10-08 LAB — COMPREHENSIVE METABOLIC PANEL
ALT: 12 U/L (ref 0–44)
AST: 13 U/L — ABNORMAL LOW (ref 15–41)
Albumin: 3.8 g/dL (ref 3.5–5.0)
Alkaline Phosphatase: 67 U/L (ref 38–126)
Anion gap: 12 (ref 5–15)
BUN: 17 mg/dL (ref 6–20)
CO2: 19 mmol/L — ABNORMAL LOW (ref 22–32)
Calcium: 9.6 mg/dL (ref 8.9–10.3)
Chloride: 104 mmol/L (ref 98–111)
Creatinine, Ser: 1.17 mg/dL — ABNORMAL HIGH (ref 0.44–1.00)
GFR, Estimated: 58 mL/min — ABNORMAL LOW (ref >60)
Glucose, Bld: 360 mg/dL — ABNORMAL HIGH (ref 70–99)
Potassium: 4.3 mmol/L (ref 3.5–5.1)
Sodium: 135 mmol/L (ref 135–145)
Total Bilirubin: 0.3 mg/dL (ref 0.3–1.2)
Total Protein: 7.4 g/dL (ref 6.5–8.1)

## 2020-10-08 LAB — I-STAT BETA HCG BLOOD, ED (MC, WL, AP ONLY): I-stat hCG, quantitative: <5 m[IU]/mL (ref <5)

## 2020-10-08 LAB — CBC WITH DIFFERENTIAL/PLATELET
Abs Immature Granulocytes: 0.05 10*3/uL (ref 0.00–0.07)
Basophils Absolute: 0 10*3/uL (ref 0.0–0.1)
Basophils Relative: 0 %
Eosinophils Absolute: 0.1 10*3/uL (ref 0.0–0.5)
Eosinophils Relative: 1 %
HCT: 35.8 % — ABNORMAL LOW (ref 36.0–46.0)
Hemoglobin: 11.5 g/dL — ABNORMAL LOW (ref 12.0–15.0)
Immature Granulocytes: 1 %
Lymphocytes Relative: 32 %
Lymphs Abs: 2 10*3/uL (ref 0.7–4.0)
MCH: 31.6 pg (ref 26.0–34.0)
MCHC: 32.1 g/dL (ref 30.0–36.0)
MCV: 98.4 fL (ref 80.0–100.0)
Monocytes Absolute: 0.6 10*3/uL (ref 0.1–1.0)
Monocytes Relative: 9 %
Neutro Abs: 3.6 10*3/uL (ref 1.7–7.7)
Neutrophils Relative %: 57 %
Platelets: 198 10*3/uL (ref 150–400)
RBC: 3.64 MIL/uL — ABNORMAL LOW (ref 3.87–5.11)
RDW: 15.3 % (ref 11.5–15.5)
WBC: 6.3 10*3/uL (ref 4.0–10.5)
nRBC: 0 % (ref 0.0–0.2)

## 2020-10-08 IMAGING — CT CT RENAL STONE PROTOCOL
2 of 4 series · 16 of 46 positions shown, 18 images · non-contrast
Comparison: CT Abdomen and Pelvis [DATE], [DATE].

CLINICAL DATA: 49-year-old female with flank pain on the right for
4 days.

EXAM:
CT ABDOMEN AND PELVIS WITHOUT CONTRAST
TECHNIQUE: Multidetector CT imaging of the abdomen and pelvis was performed
following the standard protocol without IV contrast.

[Series 2: axial st · axial · 0.87mm/px · z∈[+1007,+1422]mm · 13 of 93 slices shown, 15 images]
[im 5/93  soft-tissue]
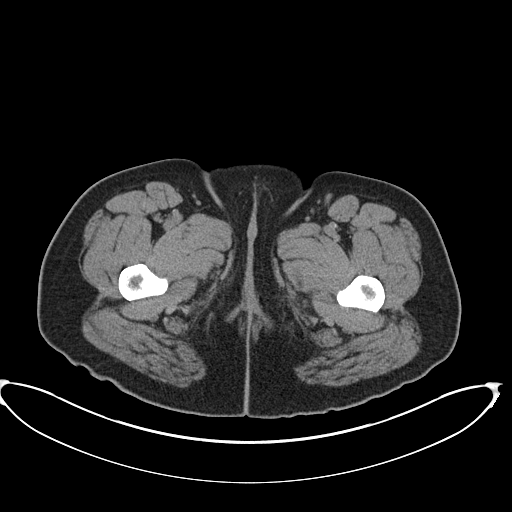
[im 5/93  bone]
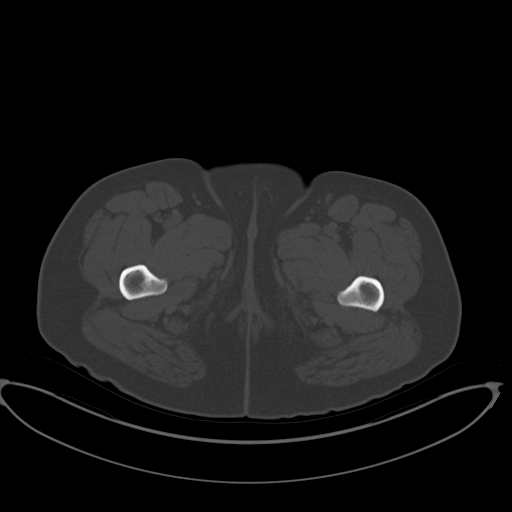
[im 14/93  soft-tissue]
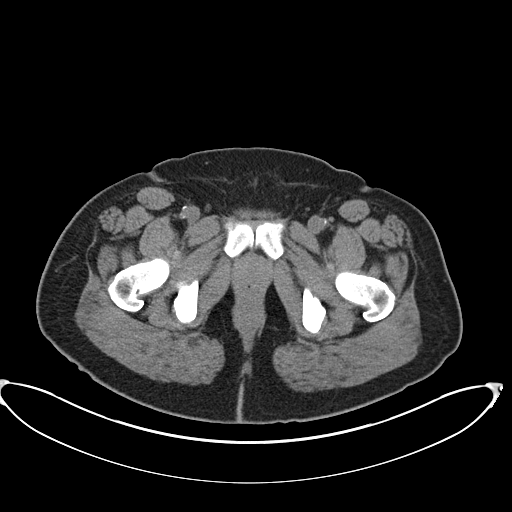
[im 18/93  soft-tissue]
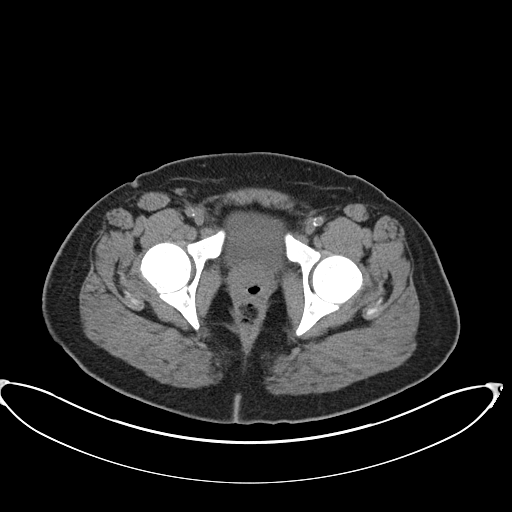
[im 27/93  soft-tissue]
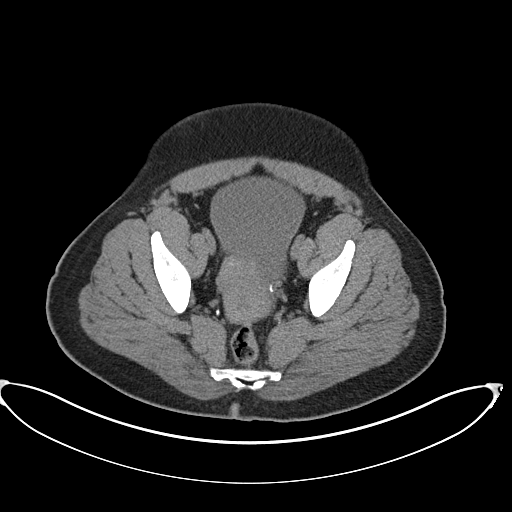
[im 31/93  soft-tissue]
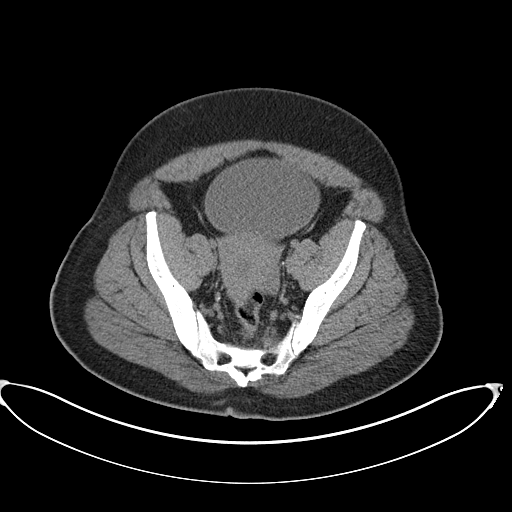
[im 40/93  soft-tissue]
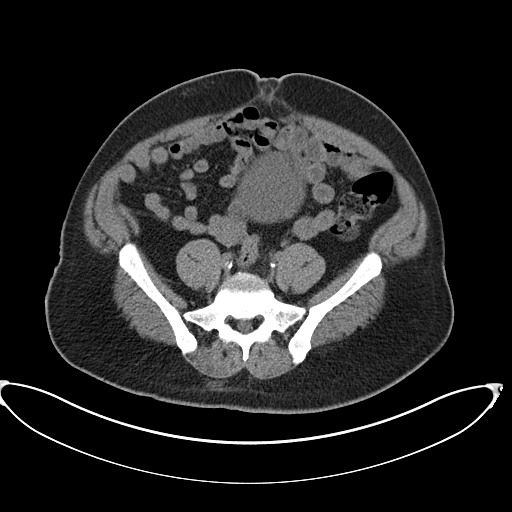
[im 49/93  soft-tissue]
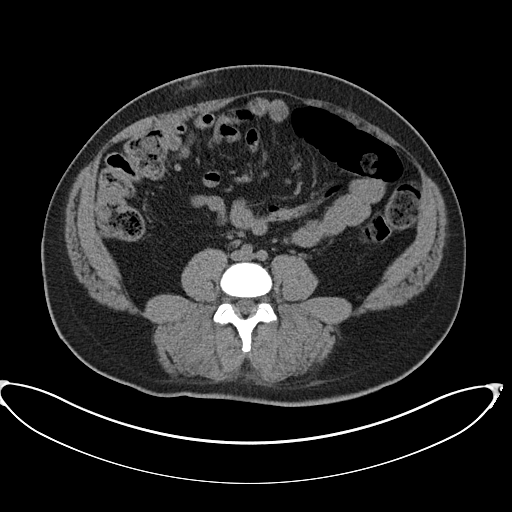
[im 53/93  soft-tissue]
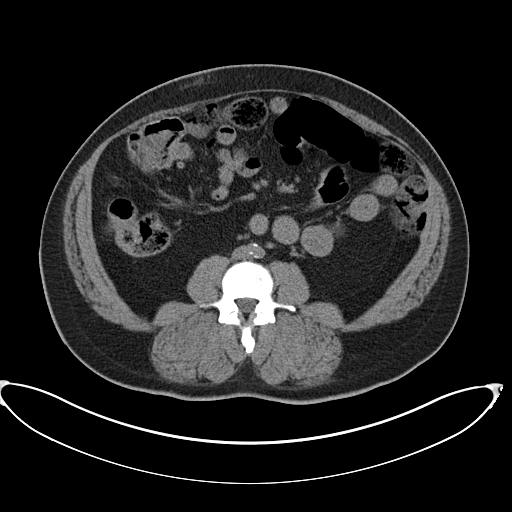
[im 62/93  soft-tissue]
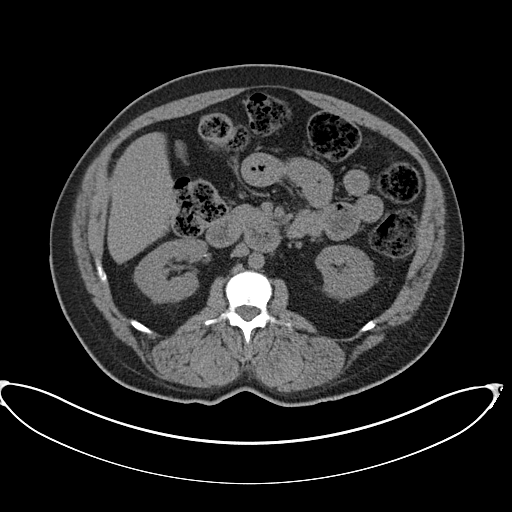
[im 62/93  bone]
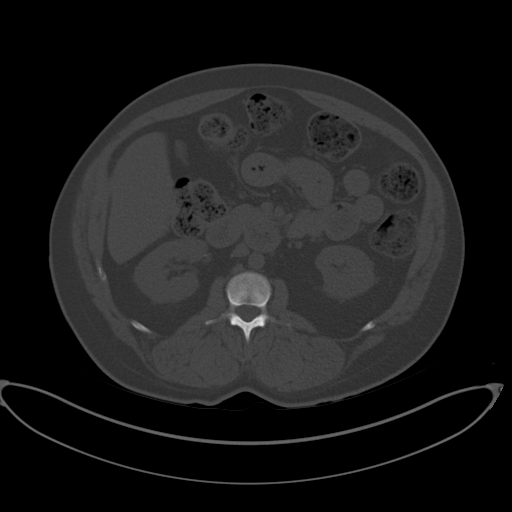
[im 66/93  soft-tissue]
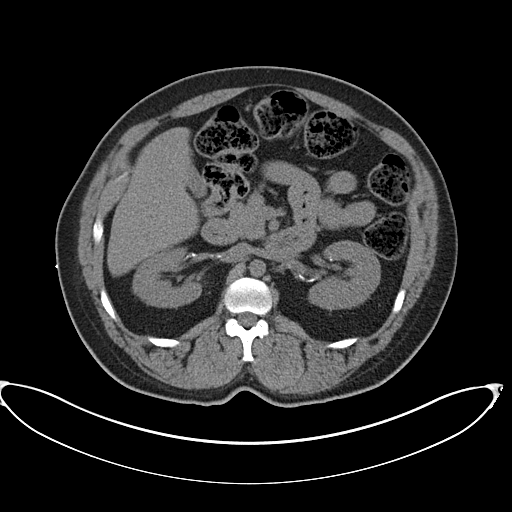
[im 75/93  soft-tissue]
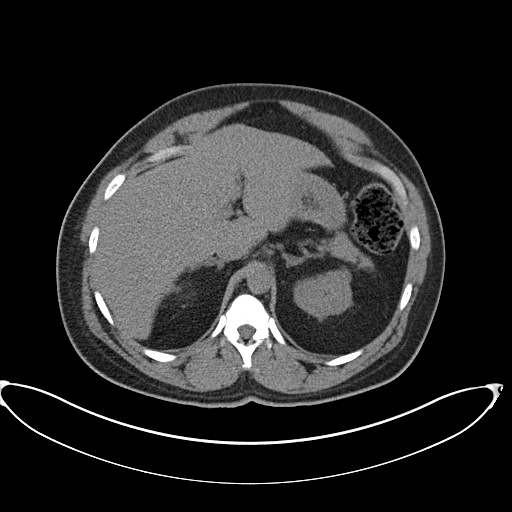
[im 79/93  soft-tissue]
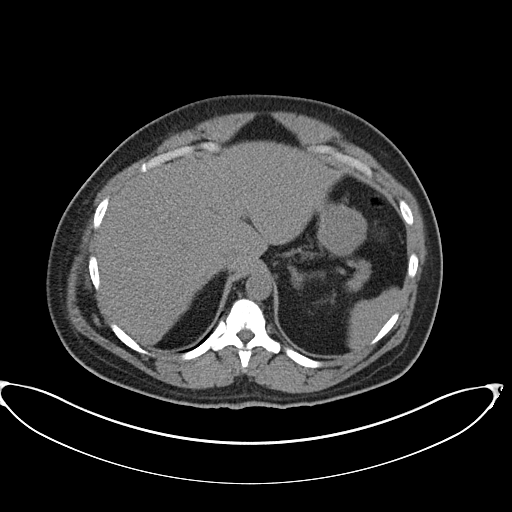
[im 88/93  soft-tissue]
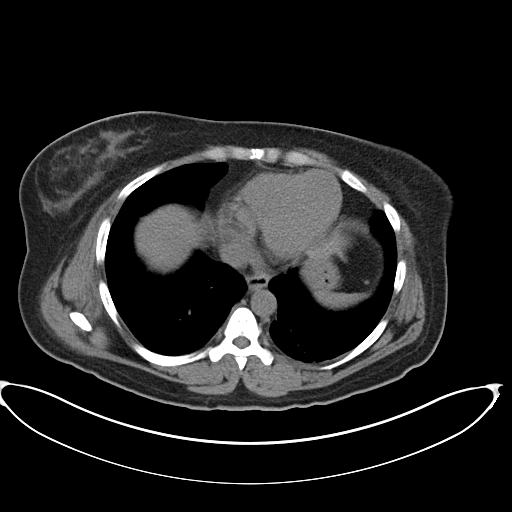

[Series 4: coronal · coronal · 0.80mm/px · 3 of 165 slices shown]
[im 55/165  soft-tissue]
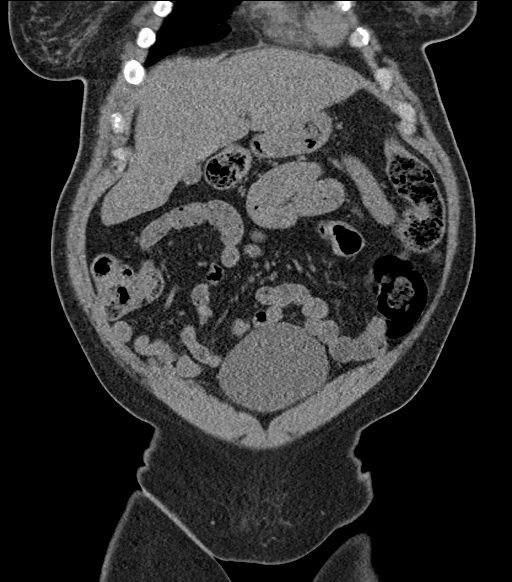
[im 73/165  soft-tissue]
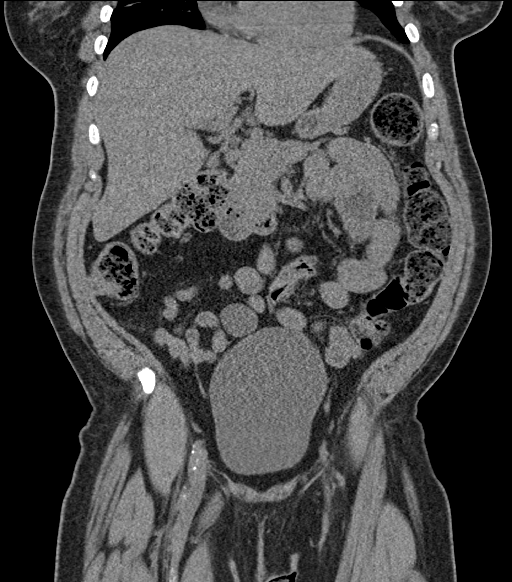
[im 92/165  soft-tissue]
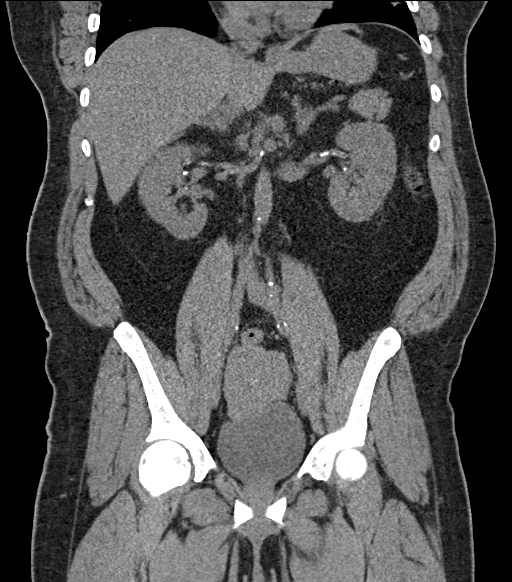

[16 of 46 positions shown; findings below may reference images not displayed]

FINDINGS: Lower chest: Mild platelike atelectasis at both lung bases is
increased compared to RTOYOTA, with otherwise mild postinflammatory
ground-glass opacity suspected in both lower lobes (note lower lobe
appearance in [REDACTED] last year). No pericardial or pleural
effusion.

Hepatobiliary: Negative noncontrast liver and gallbladder.

Pancreas: Negative.

Spleen: Negative.

Adrenals/Urinary Tract: Normal adrenal glands.

Chronic bilateral renal vascular calcifications. No hydronephrosis
or perinephric stranding. Chronic left lateral renal cortical
scarring is stable (coronal image 105). Both proximal ureters are
decompressed. Distal ureters are difficult to delineate. Multiple
chronic pelvic phleboliths suspected and stable since last year.
Distended urinary bladder (estimated at 230 mL) but otherwise
unremarkable.

Stomach/Bowel: Redundant large bowel with retained stool increased
from last year. The cecum is on a lax mesentery with normal appendix
(series 2, image 45). No large bowel inflammation. Terminal ileum is
decompressed and negative. No dilated small bowel. Decompressed
stomach. Negative duodenum. No free air, free fluid, mesenteric
inflammation.

Vascular/Lymphatic: Normal caliber abdominal aorta. Aortoiliac
calcified atherosclerosis. Vascular patency is not evaluated in the
absence of IV contrast. No lymphadenopathy.

Reproductive: Stable mild uterine lobulation suggestive of 1 or more
small chronic fibroids. Physiologic right ovarian cysts suspected on
series 2, image 52.

Other: No pelvic free fluid.

Musculoskeletal: Stable, negative osseous structures. Ventral
abdominal wall probable subcutaneous injection site on series 2,
image 46.
IMPRESSION: 1. Renal vascular calcifications. No obstructive uropathy. No
obvious urinary calculus. Distended urinary bladder (230 mL), query
urinary retention.
2. Increased retained stool throughout redundant large bowel since
last year. Normal appendix.
3. Mild postinflammatory scarring and atelectasis at both lung
bases.
4. Aortic Atherosclerosis ([X1]-[X1]).

## 2020-10-08 IMAGING — CR DG RIBS W/ CHEST 3+V*R*
3 series · 3 of 3 positions shown · non-contrast
Comparison: Chest radiograph and chest CT [DATE]

CLINICAL DATA: Pain

EXAM:
RIGHT RIBS AND CHEST - 3+ VIEW

[w chest pa]
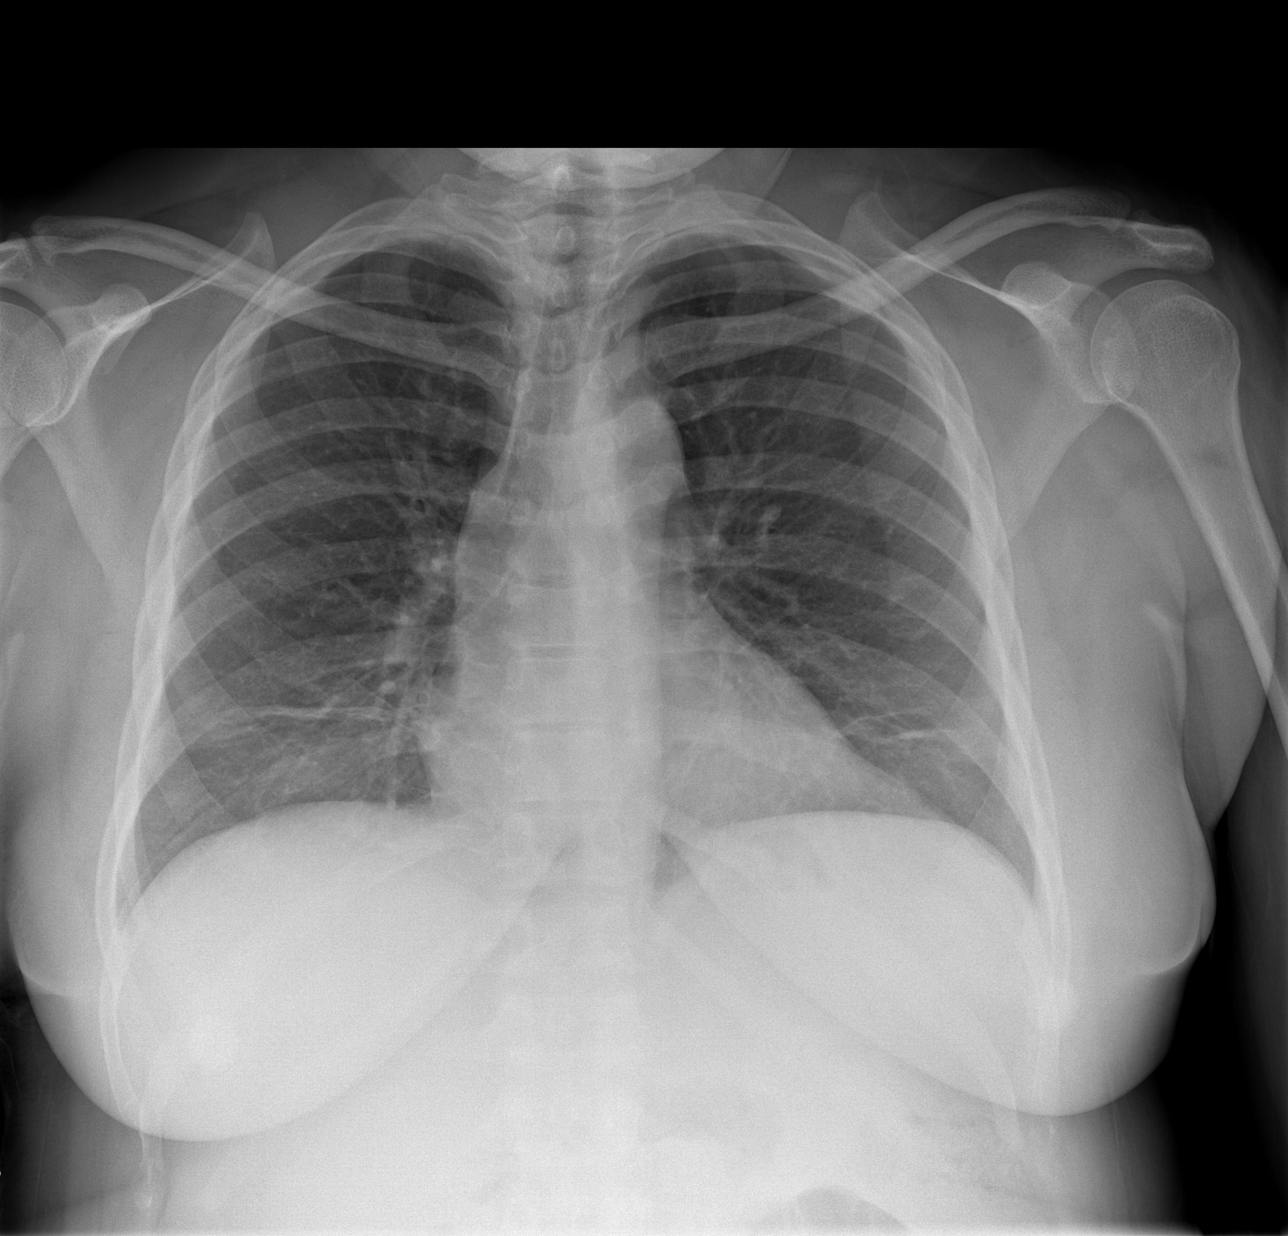

[w ribs ap lower right]
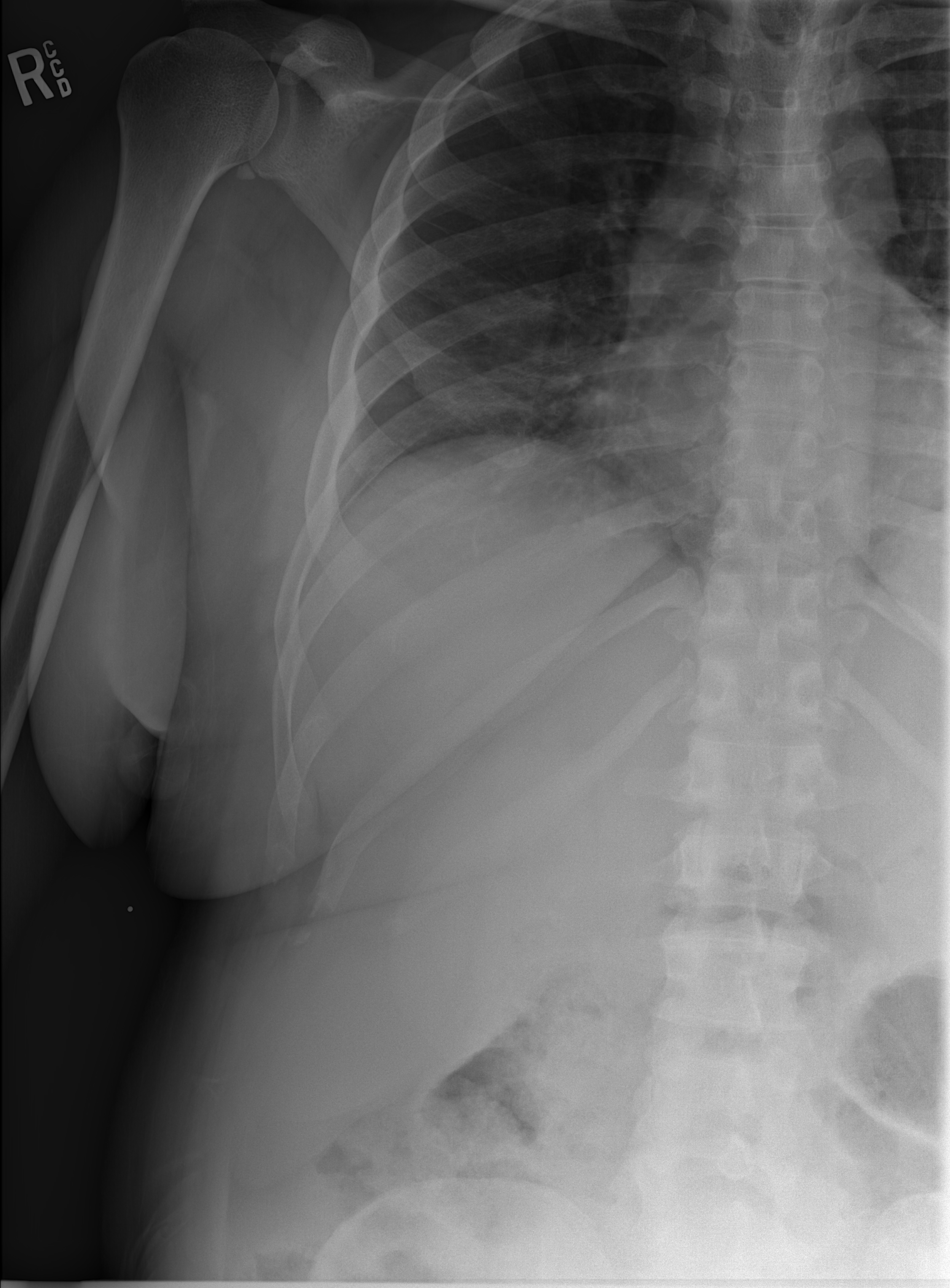

[w ribs obl right]
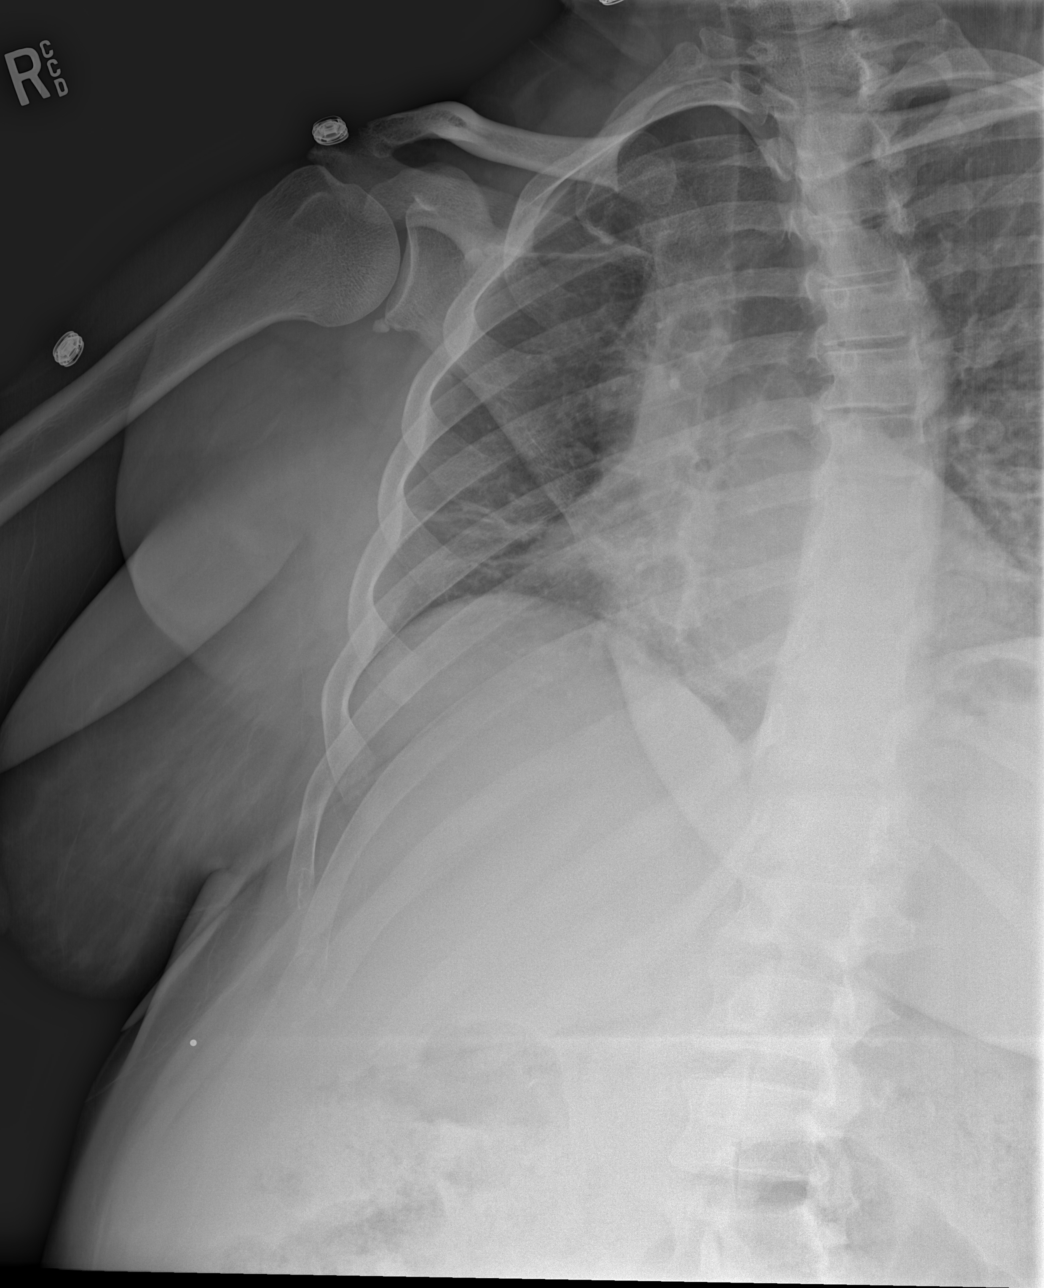

[3 of 3 positions shown; findings below may reference images not displayed]

FINDINGS: Frontal chest as well as frontal and oblique views were obtained.
There is apparent bibasilar scarring. No edema or airspace opacity.
Heart size and pulmonary vascularity are normal. No adenopathy.

No evident pneumothorax or pleural effusion. No appreciable rib
fractures. Stable focal calcification immediately inferior to the
right glenoid which may represent residua of previous trauma.
IMPRESSION: No evident rib fracture. No pneumothorax or pleural effusion.
Scarring in each lung base. Heart size within normal limits.

## 2020-10-08 MED ORDER — HYDROCODONE-ACETAMINOPHEN 5-325 MG PO TABS
1.0000 | ORAL_TABLET | Freq: Once | ORAL | Status: AC
  Administered 2020-10-08: 1 via ORAL
  Filled 2020-10-08: qty 1

## 2020-10-08 MED ORDER — LIDOCAINE 5 % EX PTCH
1.0000 | MEDICATED_PATCH | CUTANEOUS | Status: DC
  Administered 2020-10-08: 1 via TRANSDERMAL
  Filled 2020-10-08: qty 1

## 2020-10-08 MED ORDER — LIDOCAINE 5 % EX PTCH: 1.0000 | MEDICATED_PATCH | CUTANEOUS | 0 refills | Status: DC

## 2020-10-08 MED ORDER — MORPHINE SULFATE (PF) 4 MG/ML IV SOLN
6.0000 mg | Freq: Once | INTRAVENOUS | Status: AC
  Administered 2020-10-08: 6 mg via INTRAMUSCULAR
  Filled 2020-10-08: qty 2

## 2020-10-08 MED ORDER — HYDROCODONE-ACETAMINOPHEN 5-325 MG PO TABS
1.0000 | ORAL_TABLET | Freq: Four times a day (QID) | ORAL | 0 refills | Status: DC | PRN
Start: 2020-10-08 — End: 2020-12-09

## 2020-10-08 NOTE — ED Notes (Signed)
Social worker consulted for ride home and to pharmacy

## 2020-10-08 NOTE — ED Notes (Signed)
Social worker and MD working together to fix medication due to we cant pay for narcotics.

## 2020-10-08 NOTE — ED Notes (Signed)
Patient to lobby to wait on social worker

## 2020-10-08 NOTE — ED Provider Notes (Signed)
[facility] DEPT Provider Note   CSN: [id number] Arrival date & time: 10/08/20  [id number]     History Chief Complaint  Patient presents with  . Flank Pain    [facility] is a 50 y.o. female.  Patient is a 50 year old female with multiple medical problems including ongoing tobacco use, diabetes, CAD status post stent in 2017, CHF/ischemic cardiomyopathy, hypertension, high cholesterol, prior cocaine abuse and CVA who is presenting today with right side pain.  Patient reports 4 days ago is when it started.  She describes it as pain in the right side only just below the ribs.  She denies any new shortness of breath, cough or chest pain.  The pain is worse with any type of movement but it is present at rest as well.  It has not affected her eating or drinking.  She has not noticed any urinary changes.  She denies any known injury and does not do excessive lifting.  She has been taking her medications as prescribed and following up with her doctors as scheduled.  She does report having pain like this in the past last time she was seen for this was July 2021 and at that time she reports they could not find anything.  Looking at the notes it appeared that they were suspicious that she had musculoskeletal pain at the time.  She has no prior history of blood clots and denies any unilateral leg pain or numbness or swelling.  No recent immobilization.  She has been trying to take Tylenol for the pain which does help a little bit.  Due to her extensive heart history she does not take NSAIDs.  The history is provided by the patient.  Flank Pain       Past Medical History:  Diagnosis Date  . Acid reflux   . Cocaine abuse (HCC)   . Coronary artery disease    s/p STEMI 11/2019; PCI/DES to mLAD in 2017 and PCI/DES to dLAD in 11/2019  . Diabetes mellitus without complication (HCC)   . Heart attack (HCC) 04/2016  . High cholesterol   . Hypertension   . Ischemic  cardiomyopathy 11/2019  . Tobacco abuse     Patient Active Problem List   Diagnosis Date Noted  . Coronary artery disease involving native coronary artery of native heart with angina pectoris (HCC)   . Acute CVA (cerebrovascular accident) (HCC) 03/23/2020  . Hyperglycemia due to type 2 diabetes mellitus (HCC) 03/23/2020  . Essential hypertension 11/28/2019  . UTI (urinary tract infection) 11/28/2019  . Hyperlipidemia associated with type 2 diabetes mellitus (HCC)   . Tobacco abuse   . Type II diabetes mellitus with neurological manifestations, uncontrolled (HCC) 11/25/2019  . ST elevation myocardial infarction (STEMI) of anterior wall (HCC) 11/23/2019  . CAD S/P percutaneous coronary angioplasty 11/23/2019  . Acute ST elevation myocardial infarction (STEMI) of anteroseptal wall (HCC) 11/23/2019  . Sepsis (HCC) 09/19/2019  . Cocaine abuse with cocaine-induced mood disorder (HCC) 06/10/2017    Past Surgical History:  Procedure Laterality Date  . CORONARY/GRAFT ACUTE MI REVASCULARIZATION N/A 11/23/2019   Procedure: CORONARY/GRAFT ACUTE MI REVASCULARIZATION;  Surgeon: Marykay LexHarding, David W, MD;  [facility];  Service: Cardiovascular;  Laterality: N/A;  . LEFT HEART CATH AND CORONARY ANGIOGRAPHY N/A 11/23/2019   Procedure: LEFT HEART CATH AND CORONARY ANGIOGRAPHY;  Surgeon: Marykay LexHarding, David W, MD;  [facility];  Service: Cardiovascular;  Laterality: N/A;     OB History  No obstetric history on file.     Family History  Problem Relation Age of Onset  . Diabetes Mother   . Diabetes Father   . Diabetes Maternal Grandmother   . Diabetes Maternal Grandfather     Social History   Tobacco Use  . Smoking status: Current Every Day Smoker    Packs/day: 1.50    Years: 20.00    Pack years: 30.00    Types: Cigarettes  . Smokeless tobacco: Never Used  Vaping Use  . Vaping Use: Never used  Substance Use Topics  . Alcohol use: Not Currently  . Drug use: Yes     Types: "Crack" cocaine    Comment: last used yesterday. States " I do it everyday"     Home Medications Prior to Admission medications   Medication Sig Start Date End Date Taking? Authorizing Provider  albuterol (VENTOLIN HFA) 108 (90 Base) MCG/ACT inhaler Inhale 2 puffs into the lungs every 6 (six) hours as needed for wheezing or shortness of breath.    [provider]  amLODipine (NORVASC) 5 MG tablet Take 1 tablet (5 mg total) by mouth daily. 09/26/20 12/25/20  Kroeger, Ovidio Kin., PA-C  aspirin 81 MG chewable tablet Chew 81 mg by mouth daily.    [provider]  atorvastatin (LIPITOR) 80 MG tablet Take 1 tablet (80 mg total) by mouth daily at 6 PM. 07/15/20   Kroeger, Ovidio Kin., PA-C  carvedilol (COREG) 25 MG tablet Take 1 tablet (25 mg total) by mouth 2 (two) times daily with a meal. 07/15/20   Kroeger, Ovidio Kin., PA-C  cyclobenzaprine (FLEXERIL) 10 MG tablet Take 10 mg by mouth 3 (three) times daily as needed for muscle spasms.     [provider]  empagliflozin (JARDIANCE) 10 MG TABS tablet Take 1 tablet (10 mg total) by mouth daily before breakfast. 07/15/20   Kroeger, Ovidio Kin., PA-C  Evolocumab (REPATHA SURECLICK) 140 MG/ML SOAJ Inject 140 mg into the skin every 14 (fourteen) days. 09/26/20   Kroeger, Ovidio Kin., PA-C  gabapentin (NEURONTIN) 300 MG capsule Take 300 mg by mouth 3 (three) times daily.    [provider]  HUMULIN R 100 UNIT/ML injection Inject 17 Units into the skin 2 (two) times daily before a meal.  06/13/18   [provider]  insulin NPH-regular Human (70-30) 100 UNIT/ML injection Inject 35 Units into the skin 2 (two) times daily.  07/01/17   [provider]  isosorbide mononitrate (IMDUR) 60 MG 24 hr tablet Take 1.5 tablets (90 mg total) by mouth daily. 07/15/20   Kroeger, Ovidio Kin., PA-C  loratadine (CLARITIN) 10 MG tablet Take 10 mg by mouth daily.    [provider]  nitroGLYCERIN (NITROSTAT) 0.4 MG SL  tablet Place 1 tablet (0.4 mg total) under the tongue as needed for chest pain. 07/15/20   Kroeger, Ovidio Kin., PA-C  pantoprazole (PROTONIX) 40 MG tablet Take 40 mg by mouth 2 (two) times daily.     [provider]  polyethylene glycol powder (GLYCOLAX/MIRALAX) powder Take 17 g by mouth daily as needed for moderate constipation.  04/05/18   [provider]  QUEtiapine (SEROQUEL) 200 MG tablet Take 200 mg by mouth daily.  04/05/18   [provider]  QUEtiapine (SEROQUEL) 400 MG tablet Take 400 mg by mouth at bedtime. 04/05/18   [provider]  ticagrelor (BRILINTA) 60 MG TABS tablet Take 1 tablet (60 mg total) by mouth 2 (two) times daily. 09/26/20  Kroeger, Dot Lanes M., PA-C    Allergies    Bee venom, Contrast media [iodinated diagnostic agents], and Tomato  Review of Systems   Review of Systems  Genitourinary: Positive for flank pain.  All other systems reviewed and are negative.   Physical Exam Updated Vital Signs BP (!) 149/91 (BP Location: Left Arm)   Pulse 96   Temp (!) 97.5 F (36.4 C) (Oral)   Resp 18   LMP 10/07/2020   SpO2 100%   Physical Exam Vitals and nursing note reviewed.  Constitutional:      General: She is in acute distress.     Appearance: She is well-developed and well-nourished.  HENT:     Head: Normocephalic and atraumatic.     Mouth/Throat:     Mouth: Mucous membranes are moist.  Eyes:     Extraocular Movements: EOM normal.     Pupils: Pupils are equal, round, and reactive to light.  Cardiovascular:     Rate and Rhythm: Normal rate and regular rhythm.     Pulses: Normal pulses and intact distal pulses.     Heart sounds: Murmur heard.   Systolic murmur is present with a grade of 2/6. No friction rub.  Pulmonary:     Effort: Pulmonary effort is normal.     Breath sounds: Normal breath sounds. No wheezing or rales.  Abdominal:     General: Bowel sounds are normal. There is no distension.     Palpations: Abdomen is soft.      Tenderness: There is abdominal tenderness. There is no right CVA tenderness, guarding or rebound.    Musculoskeletal:        General: No tenderness. Normal range of motion.     Right lower leg: No edema.     Left lower leg: No edema.     Comments: No edema  Skin:    General: Skin is warm and dry.     Capillary Refill: Capillary refill takes less than 2 seconds.     Findings: No rash.  Neurological:     Mental Status: She is alert and oriented to person, place, and time. Mental status is at baseline.     Cranial Nerves: No cranial nerve deficit.  Psychiatric:        Mood and Affect: Mood and affect and mood normal.        Behavior: Behavior normal.        Thought Content: Thought content normal.     ED Results / Procedures / Treatments   Labs (all labs ordered are listed, but only abnormal results are displayed) Labs Reviewed  CBC WITH DIFFERENTIAL/PLATELET - Abnormal; Notable for the following components:      Result Value   RBC 3.64 (*)    Hemoglobin 11.5 (*)    HCT 35.8 (*)    All other components within normal limits  COMPREHENSIVE METABOLIC PANEL - Abnormal; Notable for the following components:   CO2 19 (*)    Glucose, Bld 360 (*)    Creatinine, Ser 1.17 (*)    AST 13 (*)    GFR, Estimated 58 (*)    All other components within normal limits  URINALYSIS, ROUTINE W REFLEX MICROSCOPIC - Abnormal; Notable for the following components:   Color, Urine RED (*)    APPearance CLOUDY (*)    Glucose, UA >=500 (*)    Hgb urine dipstick LARGE (*)    Protein, ur 30 (*)    Nitrite POSITIVE (*)    RBC /  HPF >50 (*)    Bacteria, UA RARE (*)    All other components within normal limits  I-STAT BETA HCG BLOOD, ED ([facility], [facility], [facility] ONLY)    EKG None  Radiology DG Ribs Unilateral W/Chest Right  Result Date: 10/08/2020 CLINICAL DATA:  Pain EXAM: RIGHT RIBS AND CHEST - 3+ VIEW COMPARISON:  Chest radiograph and chest CT September 19, 2019 FINDINGS: Frontal chest as well as frontal  and oblique views were obtained. There is apparent bibasilar scarring. No edema or airspace opacity. Heart size and pulmonary vascularity are normal. No adenopathy. No evident pneumothorax or pleural effusion. No appreciable rib fractures. Stable focal calcification immediately inferior to the right glenoid which may represent residua of previous trauma. IMPRESSION: No evident rib fracture. No pneumothorax or pleural effusion. Scarring in each lung base. Heart size within normal limits. Electronically Signed   By: Bretta Bang III M.D.   On: 10/08/2020 08:56   CT Renal Stone Study  Result Date: 10/08/2020 CLINICAL DATA:  50 year old female with flank pain on the right for 4 days. EXAM: CT ABDOMEN AND PELVIS WITHOUT CONTRAST TECHNIQUE: Multidetector CT imaging of the abdomen and pelvis was performed following the standard protocol without IV contrast. COMPARISON:  CT Abdomen and Pelvis 02/06/2020, 09/19/2019. FINDINGS: Lower chest: Mild platelike atelectasis at both lung bases is increased compared to July, with otherwise mild postinflammatory ground-glass opacity suspected in both lower lobes (note lower lobe appearance in February last year). No pericardial or pleural effusion. Hepatobiliary: Negative noncontrast liver and gallbladder. Pancreas: Negative. Spleen: Negative. Adrenals/Urinary Tract: Normal adrenal glands. Chronic bilateral renal vascular calcifications. No hydronephrosis or perinephric stranding. Chronic left lateral renal cortical scarring is stable (coronal image 105). Both proximal ureters are decompressed. Distal ureters are difficult to delineate. Multiple chronic pelvic phleboliths suspected and stable since last year. Distended urinary bladder (estimated at 230 mL) but otherwise unremarkable. Stomach/Bowel: Redundant large bowel with retained stool increased from last year. The cecum is on a lax mesentery with normal appendix (series 2, image 45). No large bowel inflammation. Terminal  ileum is decompressed and negative. No dilated small bowel. Decompressed stomach. Negative duodenum. No free air, free fluid, mesenteric inflammation. Vascular/Lymphatic: Normal caliber abdominal aorta. Aortoiliac calcified atherosclerosis. Vascular patency is not evaluated in the absence of IV contrast. No lymphadenopathy. Reproductive: Stable mild uterine lobulation suggestive of 1 or more small chronic fibroids. Physiologic right ovarian cysts suspected on series 2, image 52. Other: No pelvic free fluid. Musculoskeletal: Stable, negative osseous structures. Ventral abdominal wall probable subcutaneous injection site on series 2, image 46. IMPRESSION: 1. Renal vascular calcifications. No obstructive uropathy. No obvious urinary calculus. Distended urinary bladder (230 mL), query urinary retention. 2. Increased retained stool throughout redundant large bowel since last year. Normal appendix. 3. Mild postinflammatory scarring and atelectasis at both lung bases. 4. Aortic Atherosclerosis (ICD10-I70.0). Electronically Signed   By: Odessa Fleming M.D.   On: 10/08/2020 10:25    Procedures Procedures   Medications Ordered in ED Medications  HYDROcodone-acetaminophen (NORCO/VICODIN) 5-325 MG per tablet 1 tablet (has no administration in time range)    ED Course  I have reviewed the triage vital signs and the nursing notes.  Pertinent labs & imaging results that were available during my care of the patient were reviewed by me and considered in my medical decision making (see chart for details).    MDM Rules/Calculators/A&P  50 year old female with multiple medical problems presenting today with well localized right upper quadrant/rib pain.  This started 4 days ago.  Is is exacerbated by movement and with palpation.  She has no skin changes consistent with shingles.  She denies any respiratory symptoms such as cough or worsening shortness of breath.  She does continue to smoke but is  denying any new infectious symptoms with lower suspicion for pneumonia.  No prior history of PE and she is low risk with her only risk factor being smoking but she is also taking Brilinta twice daily with lower suspicion for PE at this time.  Suspect most likely patient's symptoms are musculoskeletal.  Seen in July 2021 with very similar story and at that time had a negative CT renal study and labs except for questionable urine.  Will check labs today including a chest x-ray.  Patient given pain control. 9:19 AM CBC and CMP without acute issues other than recurrent hyperglycemia which is not uncommon for this patient.  UA with nitrite positive, large hb and >50RBC.  WBC were not reported but when calling the lab wbc 0-5 . RIb imaging was neg.  Pt still having pain and given UA results will get CT to r/o stone .  10:48 AM CT negative for acute findings.  Patient is not complaining of any symptoms significant for urinary retention.  She does report she recently finished her menses which is most likely the cause of her hematuria.  Feel most likely this pain is musculoskeletal.  Patient given short course of pain control.  To follow-up with her PCP.  MDM Number of Diagnoses or Management Options   Amount and/or Complexity of Data Reviewed Clinical lab tests: ordered and reviewed Tests in the radiology section of CPT: ordered and reviewed Independent visualization of images, tracings, or specimens: yes    Final Clinical Impression(s) / ED Diagnoses Final diagnoses:  Right sided abdominal pain    Rx / DC Orders ED Discharge Orders         Ordered    HYDROcodone-acetaminophen (NORCO/VICODIN) 5-325 MG tablet  Every 6 hours PRN        10/08/20 1045    lidocaine (LIDODERM) 5 %  Every 24 hours        10/08/20 1045           Gwyneth Sprout, MD 10/08/20 1049

## 2020-10-08 NOTE — ED Notes (Signed)
Discharge instructions reviewed. Waiting on Child psychotherapist.

## 2020-10-08 NOTE — ED Notes (Signed)
MD notified that patient states pain still 10/10 and medication hasn't helped any.

## 2020-10-08 NOTE — Progress Notes (Signed)
..   Transition of Care [facility]) - Emergency Department Mini Assessment   Patient Details  Name: [facility] MRN: [id number] Date of Birth: 1971/01/25  Transition of Care [facility]) CM/SW Contact:    Sapir Lavey C Tarpley-Carter, LCSWA Phone Number: 10/08/2020, 12:04 PM   Clinical Narrative: TOC CM/CSW spoke with pt and pts husband/Wilbur at bedside. Pt states she needs assistance with medication and transportation to pharmacy and home.  Antwann Preziosi Tarpley-Carter, MSW, LCSW-A Pronouns:  She, Her, Hers                  [facility] ED Transitions of CareClinical Social Worker [email or web address] [phone]   ED Mini Assessment: What brought you to the Emergency Department? : Flank Pain  Barriers to Discharge: No Barriers Identified     Means of departure: Not know  Interventions which prevented an admission or readmission: Transportation Screening,Medication Review    Patient Contact and Communications     Spoke with: [name] Contact Date: 10/08/20,          Patient states their goals for this hospitalization and ongoing recovery are:: Pt needs assistance with medication and transportation to pharmacy and home.   Choice offered to / list presented to : NA  Admission diagnosis:  back pain Patient Active Problem List   Diagnosis Date Noted  . Coronary artery disease involving native coronary artery of native heart with angina pectoris (HCC)   . Acute CVA (cerebrovascular accident) (HCC) 03/23/2020  . Hyperglycemia due to type 2 diabetes mellitus (HCC) 03/23/2020  . Essential hypertension 11/28/2019  . UTI (urinary tract infection) 11/28/2019  . Hyperlipidemia associated with type 2 diabetes mellitus (HCC)   . Tobacco abuse   . Type II diabetes mellitus with neurological manifestations, uncontrolled (HCC) 11/25/2019  . ST elevation myocardial infarction (STEMI) of anterior wall (HCC) 11/23/2019  . CAD S/P percutaneous coronary angioplasty  11/23/2019  . Acute ST elevation myocardial infarction (STEMI) of anteroseptal wall (HCC) 11/23/2019  . Sepsis (HCC) 09/19/2019  . Cocaine abuse with cocaine-induced mood disorder (HCC) 06/10/2017   PCP:  Lavinia Sharps, NP Pharmacy:   [facility] [phone] - [place], [place] - [phone] [facility] [address] AT [facility] [address] [place] [address] Phone: [phone] Fax: [phone]  [facility] - [place], [place] - [address] [place] [address] Phone: [phone] Fax: [phone]  [facility] [address] [place] - [address] [place] [address] Phone: [phone] Fax: [phone]  [facility] - [place], [place] - [address] [place] [address] Phone: [phone] Fax: [phone]

## 2020-10-08 NOTE — ED Triage Notes (Signed)
Pt complains of right flank pain x 4 days. Denies injury. No fevers/urinary symptoms.

## 2020-10-08 NOTE — Progress Notes (Signed)
TOC CM/CSW spoke with pt about medications and transportation.  CSW went over [employer] with pt.  Pt signed Therapist, nutritional.  CSW will fax [employer], and call transportation.  CSW will continue to follow for dc needs.  Ricquita Tarpley-Carter, MSW, LCSW-A Pronouns:  She, Her, Hers                  [facility] Transitions of CareClinical Social Worker [email or web address] [phone]

## 2020-10-08 NOTE — Discharge Instructions (Signed)
Everything with your kidneys and liver look normal today.  Your CAT scan did not show any problems with your kidney.  This is most likely something in the muscle which will get better on its own.  However it if is not better by next week you should follow-up with your doctor.

## 2020-10-19 ENCOUNTER — Encounter (HOSPITAL_COMMUNITY): Payer: Self-pay

## 2020-10-19 ENCOUNTER — Emergency Department (HOSPITAL_COMMUNITY): Payer: Self-pay

## 2020-10-19 ENCOUNTER — Emergency Department (HOSPITAL_COMMUNITY)
Admission: EM | Admit: 2020-10-19 | Discharge: 2020-10-19 | Disposition: A | Discharge status: Home / Self Care | Payer: Self-pay | Attending: Emergency Medicine | Admitting: Emergency Medicine

## 2020-10-19 ENCOUNTER — Other Ambulatory Visit: Payer: Self-pay

## 2020-10-19 ENCOUNTER — Encounter: Payer: Self-pay | Admitting: Cardiology

## 2020-10-19 ENCOUNTER — Other Ambulatory Visit: Payer: Self-pay | Admitting: Physician Assistant

## 2020-10-19 DIAGNOSIS — I10 Essential (primary) hypertension: Secondary | ICD-10-CM | POA: Insufficient documentation

## 2020-10-19 DIAGNOSIS — Z794 Long term (current) use of insulin: Secondary | ICD-10-CM | POA: Insufficient documentation

## 2020-10-19 DIAGNOSIS — F17210 Nicotine dependence, cigarettes, uncomplicated: Secondary | ICD-10-CM | POA: Insufficient documentation

## 2020-10-19 DIAGNOSIS — D649 Anemia, unspecified: Secondary | ICD-10-CM

## 2020-10-19 DIAGNOSIS — Z7982 Long term (current) use of aspirin: Secondary | ICD-10-CM | POA: Insufficient documentation

## 2020-10-19 DIAGNOSIS — I25119 Atherosclerotic heart disease of native coronary artery with unspecified angina pectoris: Secondary | ICD-10-CM | POA: Insufficient documentation

## 2020-10-19 DIAGNOSIS — Z955 Presence of coronary angioplasty implant and graft: Secondary | ICD-10-CM | POA: Insufficient documentation

## 2020-10-19 DIAGNOSIS — E1165 Type 2 diabetes mellitus with hyperglycemia: Secondary | ICD-10-CM | POA: Insufficient documentation

## 2020-10-19 DIAGNOSIS — Z79899 Other long term (current) drug therapy: Secondary | ICD-10-CM | POA: Insufficient documentation

## 2020-10-19 DIAGNOSIS — E1149 Type 2 diabetes mellitus with other diabetic neurological complication: Secondary | ICD-10-CM | POA: Insufficient documentation

## 2020-10-19 DIAGNOSIS — N3001 Acute cystitis with hematuria: Secondary | ICD-10-CM

## 2020-10-19 DIAGNOSIS — N939 Abnormal uterine and vaginal bleeding, unspecified: Secondary | ICD-10-CM

## 2020-10-19 DIAGNOSIS — R102 Pelvic and perineal pain unspecified side: Secondary | ICD-10-CM

## 2020-10-19 DIAGNOSIS — D689 Coagulation defect, unspecified: Secondary | ICD-10-CM

## 2020-10-19 LAB — CBC WITH DIFFERENTIAL/PLATELET
Abs Immature Granulocytes: 0.06 10*3/uL (ref 0.00–0.07)
Basophils Absolute: 0 10*3/uL (ref 0.0–0.1)
Basophils Relative: 0 %
Eosinophils Absolute: 0 10*3/uL (ref 0.0–0.5)
Eosinophils Relative: 0 %
HCT: 26.7 % — ABNORMAL LOW (ref 36.0–46.0)
Hemoglobin: 8.5 g/dL — ABNORMAL LOW (ref 12.0–15.0)
Immature Granulocytes: 1 %
Lymphocytes Relative: 28 %
Lymphs Abs: 2.3 10*3/uL (ref 0.7–4.0)
MCH: 31.3 pg (ref 26.0–34.0)
MCHC: 31.8 g/dL (ref 30.0–36.0)
MCV: 98.2 fL (ref 80.0–100.0)
Monocytes Absolute: 0.7 10*3/uL (ref 0.1–1.0)
Monocytes Relative: 8 %
Neutro Abs: 5.3 10*3/uL (ref 1.7–7.7)
Neutrophils Relative %: 63 %
Platelets: 335 10*3/uL (ref 150–400)
RBC: 2.72 MIL/uL — ABNORMAL LOW (ref 3.87–5.11)
RDW: 15 % (ref 11.5–15.5)
WBC: 8.5 10*3/uL (ref 4.0–10.5)
nRBC: 0.2 % (ref 0.0–0.2)

## 2020-10-19 LAB — BASIC METABOLIC PANEL
Anion gap: 12 (ref 5–15)
BUN: 19 mg/dL (ref 6–20)
CO2: 19 mmol/L — ABNORMAL LOW (ref 22–32)
Calcium: 10 mg/dL (ref 8.9–10.3)
Chloride: 105 mmol/L (ref 98–111)
Creatinine, Ser: 1.33 mg/dL — ABNORMAL HIGH (ref 0.44–1.00)
GFR, Estimated: 50 mL/min — ABNORMAL LOW (ref >60)
Glucose, Bld: 286 mg/dL — ABNORMAL HIGH (ref 70–99)
Potassium: 4.1 mmol/L (ref 3.5–5.1)
Sodium: 136 mmol/L (ref 135–145)

## 2020-10-19 LAB — URINALYSIS, ROUTINE W REFLEX MICROSCOPIC
Bilirubin Urine: NEGATIVE
Glucose, UA: >=500 mg/dL — AB
Ketones, ur: NEGATIVE mg/dL
Leukocytes,Ua: NEGATIVE
Nitrite: POSITIVE — AB
Protein, ur: 30 mg/dL — AB
Specific Gravity, Urine: 1.024 (ref 1.005–1.030)
pH: 5 (ref 5.0–8.0)

## 2020-10-19 LAB — WET PREP, GENITAL
Clue Cells Wet Prep HPF POC: NONE SEEN
Sperm: NONE SEEN
Trich, Wet Prep: NONE SEEN
Yeast Wet Prep HPF POC: NONE SEEN

## 2020-10-19 LAB — I-STAT BETA HCG BLOOD, ED (MC, WL, AP ONLY): I-stat hCG, quantitative: <5 m[IU]/mL (ref <5)

## 2020-10-19 LAB — PROTIME-INR
INR: 1 (ref 0.8–1.2)
Prothrombin Time: 12.8 seconds (ref 11.4–15.2)

## 2020-10-19 IMAGING — US US PELVIS COMPLETE TRANSABD/TRANSVAG W DUPLEX
1 series · 13 of 25 positions shown · non-contrast
Comparison: Noncontrast CT Abdomen and Pelvis [DATE].

CLINICAL DATA: 49-year-old female with pelvic pain and vaginal
bleeding.

EXAM:
TRANSABDOMINAL AND TRANSVAGINAL ULTRASOUND OF PELVIS
DOPPLER ULTRASOUND OF OVARIES
TECHNIQUE: Both transabdominal and transvaginal ultrasound examinations of the
pelvis were performed. Transabdominal technique was performed for
global imaging of the pelvis including uterus, ovaries, adnexal
regions, and pelvic cul-de-sac.
It was necessary to proceed with endovaginal exam following the
transabdominal exam to visualize the ovaries. Color and duplex
Doppler ultrasound was utilized to evaluate blood flow to the
ovaries.

[Series 1: us pelvis complete transabd/transvag w duplex · 123 acquisitions, 13 frames shown]
[im 1/123]
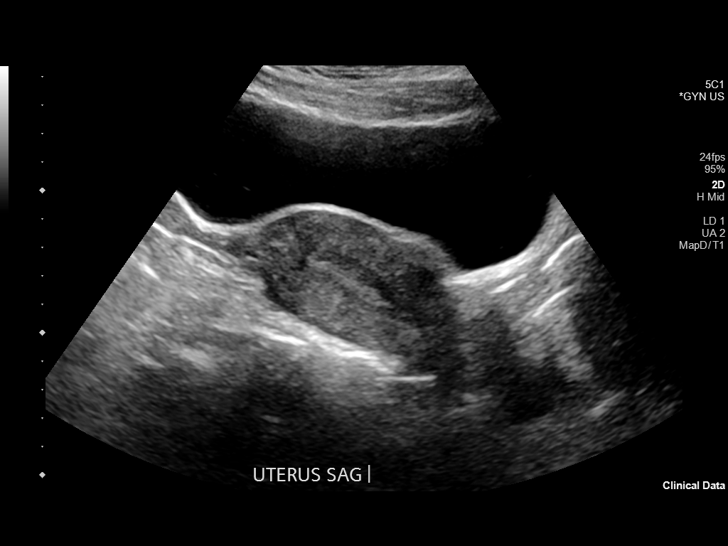
[im 11/123]
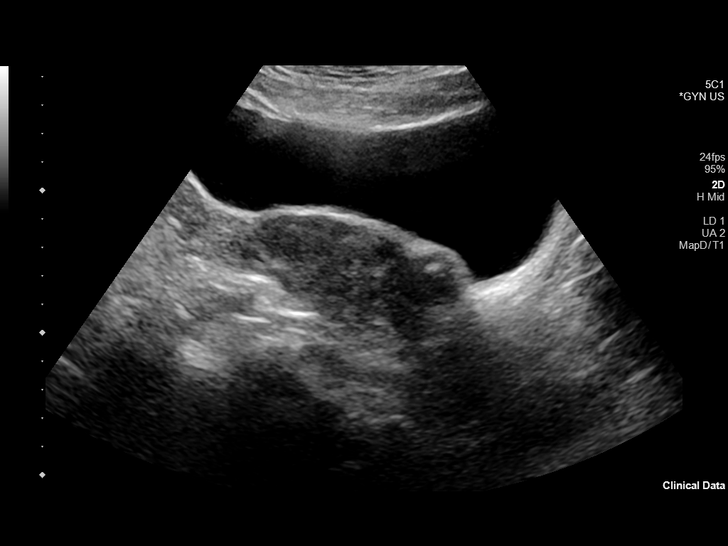
[im 21/123]
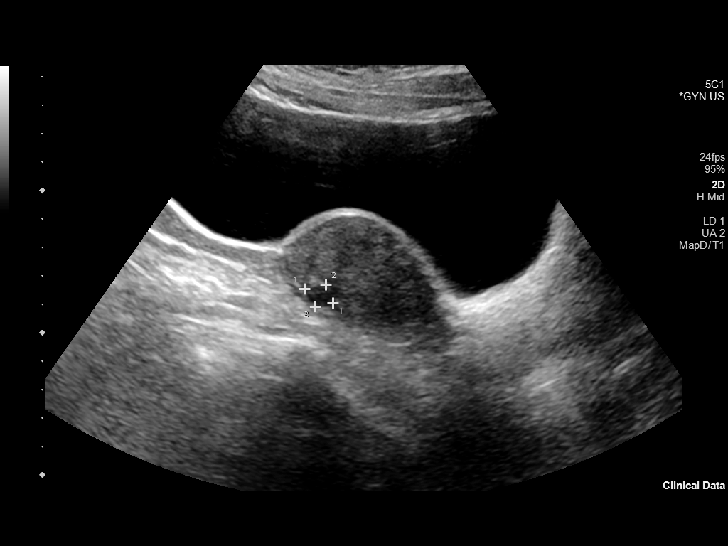
[im 31/123]
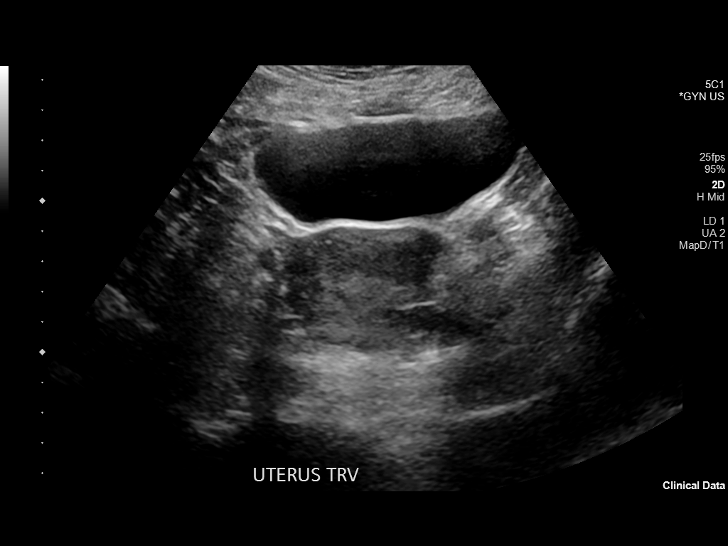
[im 41/123]
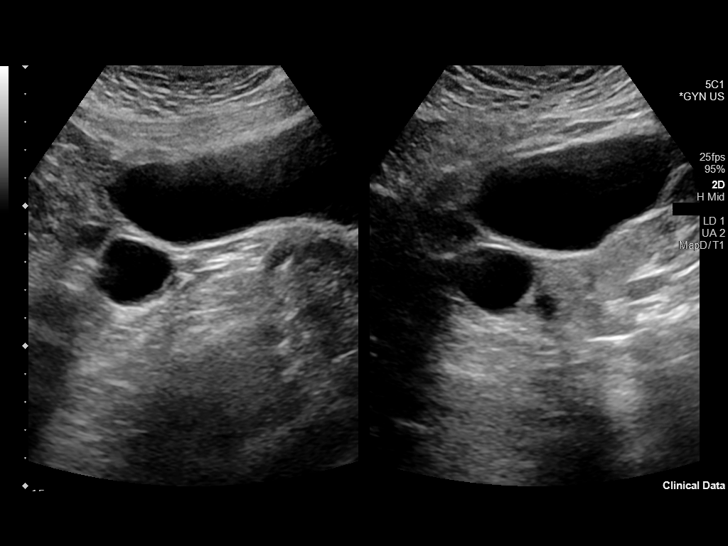
[im 51/123]
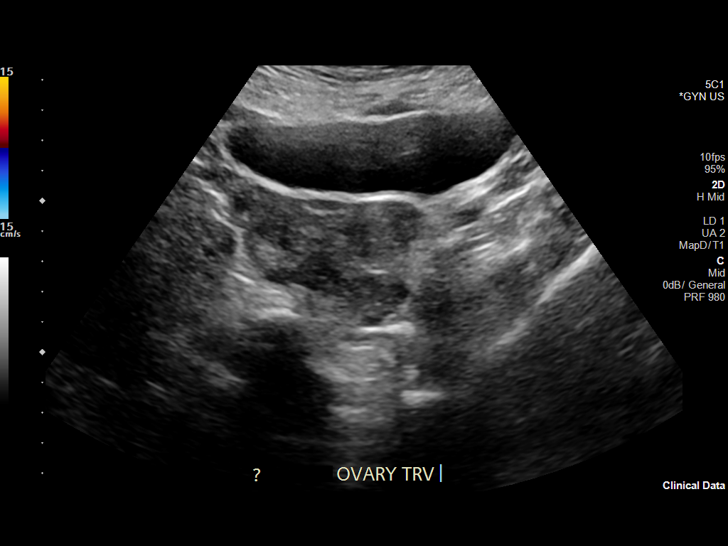
[im 62/123]
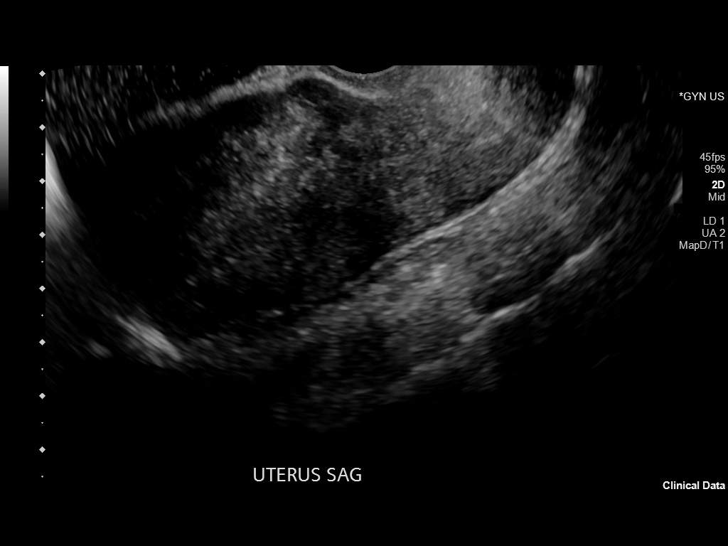
[im 72/123]
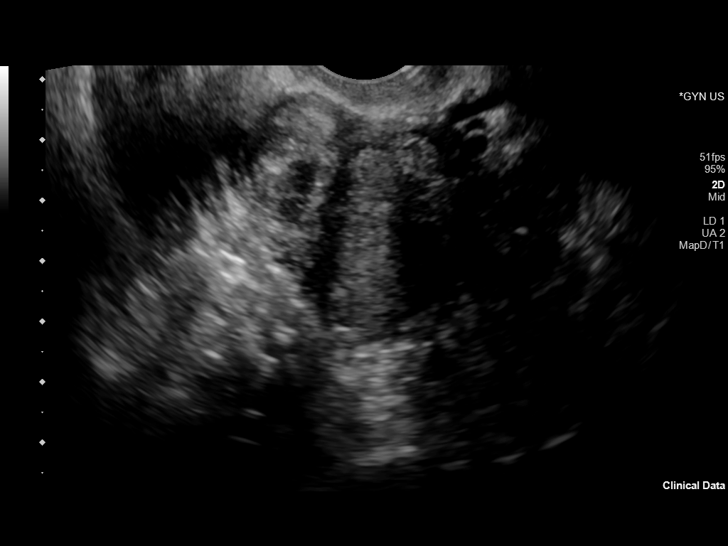
[im 82/123]
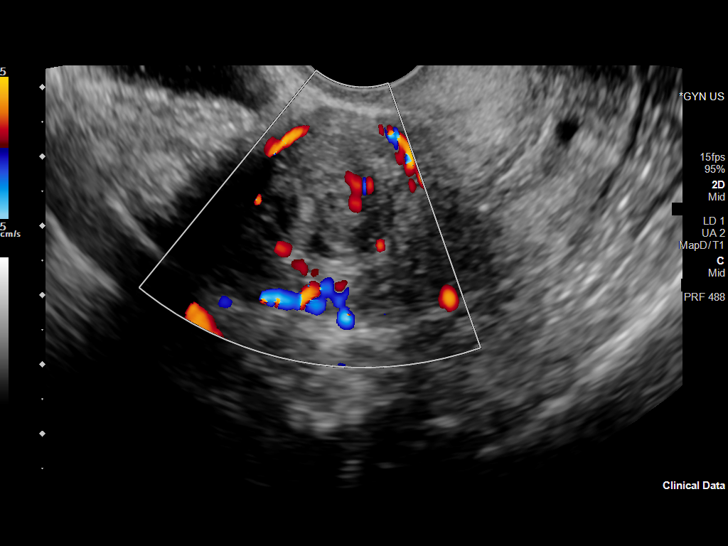
[im 92/123]
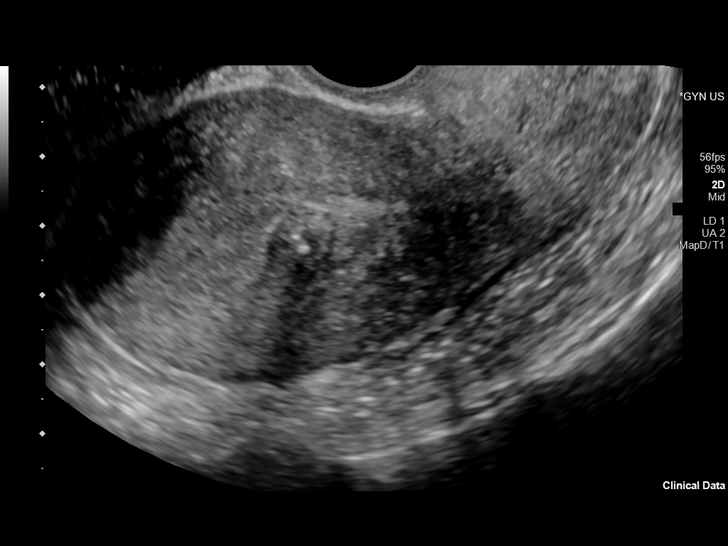
[im 102/123]
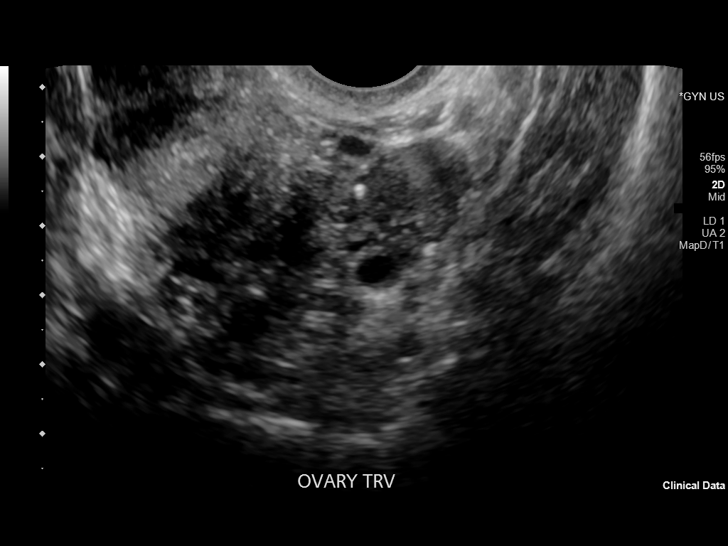
[im 112/123]
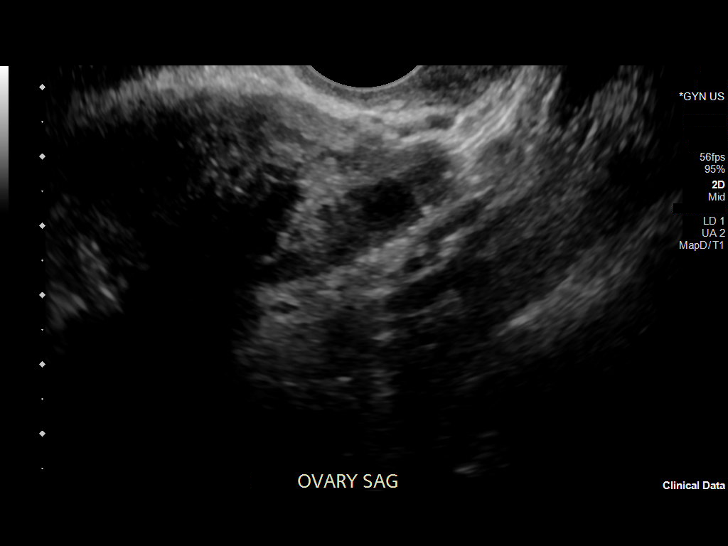
[im 123/123]
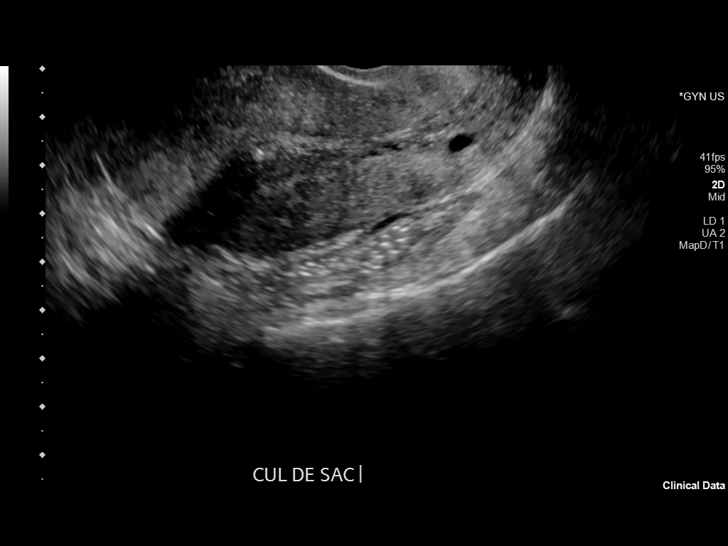

[13 of 25 positions shown; findings below may reference images not displayed]

FINDINGS: Uterus

Measurements: 10.1 x 4.2 x 4.7 cm = volume: 106 mL. Multiple small
uterine fibroids as suspected on the CT earlier this month,
measuring up to 2.4 cm diameter (left fundus images 16 and 88).
Probable small submucosal fibroid measuring 10 mm on image 93. The
rest appear to be intramural.

Endometrium

Thickness: 14 mm. Mild distortion of the endometrial contour from
suspected submucosal fibroid (image 93). Mildly heterogeneous
endometrium echogenicity (image 98).

Right ovary

Measurements: 4.7 x 2.3 x 3.0 cm = volume: 17 mL. Normal
appearance/no adnexal mass.

Left ovary

Measurements: 3.4 x 1.6 x 1.9 cm = volume: 5 mL. Within normal
limits. Several small echogenic foci associated with the left ovary
such as on image 102 appear benign. Occasional small cyst or
follicle also noted.

Pulsed Doppler evaluation of both ovaries demonstrates normal
low-resistance arterial and venous waveforms.

Other findings

Trace pelvic free fluid.

Echogenic urinary debris within the bladder (image 111).
IMPRESSION: 1. Heterogeneous endometrium, felt in part related to submucosal 1
cm fibroid. If bleeding remains unresponsive to hormonal or medical
therapy, sonohysterogram should be considered for focal lesion
work-up. (Ref: Radiological Reasoning: Algorithmic Workup of
Abnormal Vaginal Bleeding with Endovaginal Sonography and
Sonohysterography. AJR [X9]; 191:S68-73).
2. Multiple additional small uterine fibroids up to 2.4 cm appear
intramural.
3. Ovaries are within normal limits. No evidence of ovarian torsion.
4. Abnormal echogenic debris in the urinary bladder. Consider UTI,
correlate with urinalysis.

## 2020-10-19 MED ORDER — SODIUM CHLORIDE 0.9 % IV SOLN
1.0000 g | Freq: Once | INTRAVENOUS | Status: AC
  Administered 2020-10-19: 1 g via INTRAVENOUS
  Filled 2020-10-19: qty 10

## 2020-10-19 MED ORDER — SODIUM CHLORIDE 0.9 % IV BOLUS
1000.0000 mL | Freq: Once | INTRAVENOUS | Status: AC
  Administered 2020-10-19: 1000 mL via INTRAVENOUS

## 2020-10-19 MED ORDER — MEGESTROL ACETATE 40 MG PO TABS
80.0000 mg | ORAL_TABLET | Freq: Two times a day (BID) | ORAL | Status: DC
  Administered 2020-10-19: 80 mg via ORAL
  Filled 2020-10-19: qty 2

## 2020-10-19 MED ORDER — CEPHALEXIN 500 MG PO CAPS: 500.0000 mg | ORAL_CAPSULE | Freq: Two times a day (BID) | ORAL | 0 refills | Status: AC

## 2020-10-19 MED ORDER — MORPHINE SULFATE (PF) 4 MG/ML IV SOLN: 4.0000 mg | Freq: Once | INTRAVENOUS | Status: DC

## 2020-10-19 MED ORDER — FERROUS SULFATE 325 (65 FE) MG PO TABS: 325.0000 mg | ORAL_TABLET | Freq: Every day | ORAL | 0 refills | Status: DC

## 2020-10-19 MED ORDER — MEGESTROL ACETATE 40 MG PO TABS: 80.0000 mg | ORAL_TABLET | Freq: Two times a day (BID) | ORAL | 0 refills | Status: DC

## 2020-10-19 MED ORDER — HYDROCODONE-ACETAMINOPHEN 5-325 MG PO TABS
1.0000 | ORAL_TABLET | Freq: Once | ORAL | Status: AC
  Administered 2020-10-19: 1 via ORAL
  Filled 2020-10-19: qty 1

## 2020-10-19 MED ORDER — ONDANSETRON HCL 4 MG/2ML IJ SOLN
4.0000 mg | Freq: Once | INTRAMUSCULAR | Status: DC
Start: 2020-10-19 — End: 2020-10-19

## 2020-10-19 MED ORDER — CEPHALEXIN 500 MG PO CAPS: 500.0000 mg | ORAL_CAPSULE | Freq: Two times a day (BID) | ORAL | 0 refills | Status: DC

## 2020-10-19 MED ORDER — MEGESTROL ACETATE 40 MG PO TABS: 80.0000 mg | ORAL_TABLET | Freq: Two times a day (BID) | ORAL | 0 refills | Status: AC

## 2020-10-19 NOTE — ED Triage Notes (Signed)
C/o vaginal bleeding since "first of the month". States her period was late last month and now it has not stopped. Admits to using 24 pads in 2 days

## 2020-10-19 NOTE — ED Notes (Signed)
Ambulated in room, gait steady, no complaints voiced.Denies dizziness.

## 2020-10-19 NOTE — ED Provider Notes (Signed)
[facility]-EMERGENCY DEPT Provider Note   CSN: [id number] Arrival date & time: 10/19/20  4098    History Chief Complaint  Patient presents with  . Vaginal Bleeding    Maria Harper is a 50 y.o. female with past medical history significant for CAD, cocaine use, prior MI, diabetes who presents for evaluation of vaginal bleeding.  Patient states she has had vaginal bleeding, intermittent since the first of this month.  Is changing a pad every 2-3 hours.  Initially had some clots and her bleeding was bright red however she is no longer having clots and her bleeding is turned to dark red.  States she is not currently sexually active.  Denies chance of pregnancy.  No vaginal discharge.  She has no social abdominal pain.  Denies fever, chills, nausea, vomiting, chest pain, shortness of breath abdominal pain, pelvic pain, dysuria, hematuria, diarrhea or constipation.  States she feels generally weak.  No dizziness.  Denies additional aggravating or alleviating factors.  She was seen 10 days ago for right lower quadrant pain and did not mention her bleeding at that time.  States she is on blood thinner however doe snot recall name. Per chart review on Brilinta however no other similar medications listed  History obtained from patient and past medical records.  No interpreter used  HPI     Past Medical History:  Diagnosis Date  . Acid reflux   . Cocaine abuse (HCC)   . Coronary artery disease    s/p STEMI 11/2019; PCI/DES to mLAD in 2017 and PCI/DES to dLAD in 11/2019  . Diabetes mellitus without complication (HCC)   . Heart attack (HCC) 04/2016  . High cholesterol   . Hypertension   . Ischemic cardiomyopathy 11/2019  . Tobacco abuse     Patient Active Problem List   Diagnosis Date Noted  . Coronary artery disease involving native coronary artery of native heart with angina pectoris (HCC)   . Acute CVA (cerebrovascular accident) (HCC) 03/23/2020  . Hyperglycemia  due to type 2 diabetes mellitus (HCC) 03/23/2020  . Essential hypertension 11/28/2019  . UTI (urinary tract infection) 11/28/2019  . Hyperlipidemia associated with type 2 diabetes mellitus (HCC)   . Tobacco abuse   . Type II diabetes mellitus with neurological manifestations, uncontrolled (HCC) 11/25/2019  . ST elevation myocardial infarction (STEMI) of anterior wall (HCC) 11/23/2019  . CAD S/P percutaneous coronary angioplasty 11/23/2019  . Acute ST elevation myocardial infarction (STEMI) of anteroseptal wall (HCC) 11/23/2019  . Sepsis (HCC) 09/19/2019  . Cocaine abuse with cocaine-induced mood disorder (HCC) 06/10/2017    Past Surgical History:  Procedure Laterality Date  . CORONARY/GRAFT ACUTE MI REVASCULARIZATION N/A 11/23/2019   Procedure: CORONARY/GRAFT ACUTE MI REVASCULARIZATION;  Surgeon: [name], MD;  Location: [facility] INVASIVE CV LAB;  Service: Cardiovascular;  Laterality: N/A;  . LEFT HEART CATH AND CORONARY ANGIOGRAPHY N/A 11/23/2019   Procedure: LEFT HEART CATH AND CORONARY ANGIOGRAPHY;  Surgeon: [name], MD;  Location: [facility] INVASIVE CV LAB;  Service: Cardiovascular;  Laterality: N/A;     OB History   No obstetric history on file.     Family History  Problem Relation Age of Onset  . Diabetes Mother   . Diabetes Father   . Diabetes Maternal Grandmother   . Diabetes Maternal Grandfather     Social History   Tobacco Use  . Smoking status: Current Every Day Smoker    Packs/day: 1.50    Years: 20.00  Pack years: 30.00    Types: Cigarettes  . Smokeless tobacco: Never Used  Vaping Use  . Vaping Use: Never used  Substance Use Topics  . Alcohol use: Not Currently  . Drug use: Yes    Types: "Crack" cocaine    Comment: last used yesterday. States " I do it everyday"     Home Medications Prior to Admission medications   Medication Sig Start Date End Date Taking? Authorizing Provider  acetaminophen (TYLENOL) 500 MG tablet Take 1,000 mg by mouth in the  morning, at noon, and at bedtime. 09/23/20  Yes [provider]  aspirin 81 MG chewable tablet Chew 81 mg by mouth daily.   Yes [provider]  atorvastatin (LIPITOR) 80 MG tablet Take 1 tablet (80 mg total) by mouth daily at 6 PM. 07/15/20  Yes Kroeger, Dot LanesKrista M., PA-C  buPROPion [facility](WELLBUTRIN SR) 100 MG 12 hr tablet Take 100 mg by mouth 2 (two) times daily.   Yes [provider]  carvedilol (COREG) 25 MG tablet Take 1 tablet (25 mg total) by mouth 2 (two) times daily with a meal. 07/15/20  Yes Kroeger, Dot LanesKrista M., PA-C  cephALEXin (KEFLEX) 500 MG capsule Take 1 capsule (500 mg total) by mouth 2 (two) times daily for 7 days. 10/19/20 10/26/20 Yes Henderly, Britni A, PA-C  cyclobenzaprine (FLEXERIL) 10 MG tablet Take 10 mg by mouth 3 (three) times daily as needed for muscle spasms.    Yes [provider]  doxepin (SINEQUAN) 50 MG capsule Take 50 mg by mouth in the morning, at noon, and at bedtime.   Yes [provider]  empagliflozin (JARDIANCE) 10 MG TABS tablet Take 1 tablet (10 mg total) by mouth daily before breakfast. 07/15/20  Yes Kroeger, Dot LanesKrista M., PA-C  Evolocumab (REPATHA SURECLICK) 140 MG/ML SOAJ Inject 140 mg into the skin every 14 (fourteen) days. Patient taking differently: Inject 140 mg into the skin every 21 ( twenty-one) days. 09/26/20  Yes Kroeger, Dot LanesKrista M., PA-C  ferrous sulfate 325 (65 FE) MG tablet Take 1 tablet (325 mg total) by mouth daily. 10/19/20  Yes Henderly, Britni A, PA-C  gabapentin (NEURONTIN) 300 MG capsule Take 300 mg by mouth 3 (three) times daily.   Yes [provider]  HUMULIN R 100 UNIT/ML injection Inject 16 Units into the skin 2 (two) times daily before a meal. 06/13/18  Yes [provider]  insulin NPH-regular Human (70-30) 100 UNIT/ML injection Inject 40 Units into the skin 2 (two) times daily. 07/01/17  Yes [provider]  isosorbide mononitrate (IMDUR) 60 MG 24 hr tablet Take 1.5 tablets (90  mg total) by mouth daily. 07/15/20  Yes Kroeger, Dot LanesKrista M., PA-C  loratadine (CLARITIN) 10 MG tablet Take 10 mg by mouth daily.   Yes [provider]  losartan (COZAAR) 50 MG tablet Take 50 mg by mouth daily.   Yes [provider]  megestrol (MEGACE) 40 MG tablet Take 2 tablets (80 mg total) by mouth 2 (two) times daily for 14 days. 10/19/20 11/02/20 Yes Henderly, Britni A, PA-C  nitroGLYCERIN (NITROSTAT) 0.4 MG SL tablet Place 1 tablet (0.4 mg total) under the tongue as needed for chest pain. 07/15/20  Yes Kroeger, Dot LanesKrista M., PA-C  pantoprazole (PROTONIX) 40 MG tablet Take 40 mg by mouth 2 (two) times daily.    Yes [provider]  polyethylene glycol powder (GLYCOLAX/MIRALAX) powder Take 17 g by mouth daily as needed for moderate constipation.  04/05/18  Yes [provider]  PRESCRIPTION MEDICATION Inhale 1-2 puffs into the lungs as needed (Shortness of Breath / Breathing.). "patient calls it smokers inhaler"   Yes [provider]  QUEtiapine (SEROQUEL) 200 MG tablet Take 200 mg by mouth daily.  04/05/18  Yes [provider]  QUEtiapine (SEROQUEL) 400 MG tablet Take 400 mg by mouth at bedtime. 04/05/18  Yes [provider]  ticagrelor (BRILINTA) 60 MG TABS tablet Take 1 tablet (60 mg total) by mouth 2 (two) times daily. 09/26/20  Yes Kroeger, Ovidio Kin., PA-C  HYDROcodone-acetaminophen (NORCO/VICODIN) 5-325 MG tablet Take 1 tablet by mouth every 6 (six) hours as needed. 10/08/20   Gwyneth Sprout, MD  lidocaine (LIDODERM) 5 % Place 1 patch onto the skin daily. Remove & Discard patch within 12 hours or as directed by MD 10/08/20   Gwyneth Sprout, MD  amLODipine (NORVASC) 5 MG tablet Take 1 tablet (5 mg total) by mouth daily. Patient not taking: No sig reported 09/26/20 10/19/20  Beatriz Stallion., PA-C    Allergies    Bee venom, Contrast media [iodinated diagnostic agents], and Tomato  Review of Systems   Review of Systems  Constitutional:  Negative.   HENT: Negative.   Respiratory: Negative.   Cardiovascular: Negative.   Gastrointestinal: Negative.   Genitourinary: Positive for vaginal bleeding. Negative for decreased urine volume, difficulty urinating, dyspareunia, dysuria, frequency, genital sores, hematuria, menstrual problem, pelvic pain, vaginal discharge and vaginal pain.  Musculoskeletal: Negative.   Skin: Negative.   Neurological: Positive for weakness (Generalized). Negative for dizziness, facial asymmetry, light-headedness, numbness and headaches.  All other systems reviewed and are negative.   Physical Exam Updated Vital Signs BP 121/71   Pulse 83   Temp 97.7 F (36.5 C) (Oral)   Resp 18   LMP 10/07/2020   SpO2 100%   Physical Exam Vitals and nursing note reviewed.  Constitutional:      General: She is not in acute distress.    Appearance: She is well-developed. She is not ill-appearing, toxic-appearing or diaphoretic.  HENT:     Head: Normocephalic and atraumatic.     Nose: Nose normal.     Mouth/Throat:     Mouth: Mucous membranes are moist.  Eyes:     Pupils: Pupils are equal, round, and reactive to light.  Cardiovascular:     Rate and Rhythm: Normal rate.     Pulses: Normal pulses.     Heart sounds: Normal heart sounds.  Pulmonary:     Effort: Pulmonary effort is normal. No respiratory distress.     Breath sounds: Normal breath sounds.     Comments: Speaks in full sentences without difficulty Abdominal:     General: Bowel sounds are normal.     Palpations: Abdomen is soft.     Tenderness: There is no abdominal tenderness. There is no guarding or rebound.     Comments: Soft, nontender  Genitourinary:    Comments: Normal appearing external female genitalia without rashes or lesions, normal vaginal epithelium. Normal appearing cervix without discharge or petechiae. There is moderate bleeding noted at the os. No odor. Bimanual: No CMT,  nontender.  No palpable adnexal masses or tenderness.  Uterus midline and not fixed. Rectovaginal exam was deferred.  No cystocele or rectocele noted. No pelvic lymphadenopathy noted. Wet prep was obtained.  Cultures for gonorrhea and chlamydia collected. Exam performed with chaperone in room. Musculoskeletal:        General: Normal range of motion.     Cervical back: Normal range of  motion.     Comments: Moves all 4 extremities without difficulty.  Compartments soft.  No bony tenderness  Skin:    General: Skin is warm and dry.     Capillary Refill: Capillary refill takes less than 2 seconds.     Comments: No erythema or warmth.  No pallor  Neurological:     General: No focal deficit present.     Mental Status: She is alert and oriented to person, place, and time.     Comments: Cranial nerves II 12 grossly intact Ambulatory without difficulty     ED Results / Procedures / Treatments   Labs (all labs ordered are listed, but only abnormal results are displayed) Labs Reviewed  WET PREP, GENITAL - Abnormal; Notable for the following components:      Result Value   WBC, Wet Prep HPF POC FEW (*)    All other components within normal limits  CBC WITH DIFFERENTIAL/PLATELET - Abnormal; Notable for the following components:   RBC 2.72 (*)    Hemoglobin 8.5 (*)    HCT 26.7 (*)    All other components within normal limits  BASIC METABOLIC PANEL - Abnormal; Notable for the following components:   CO2 19 (*)    Glucose, Bld 286 (*)    Creatinine, Ser 1.33 (*)    GFR, Estimated 50 (*)    All other components within normal limits  URINALYSIS, ROUTINE W REFLEX MICROSCOPIC - Abnormal; Notable for the following components:   APPearance CLOUDY (*)    Glucose, UA >=500 (*)    Hgb urine dipstick LARGE (*)    Protein, ur 30 (*)    Nitrite POSITIVE (*)    Bacteria, UA MANY (*)    All other components within normal limits  URINE CULTURE  PROTIME-INR  I-STAT BETA HCG BLOOD, ED ([facility], [facility], [facility] ONLY)  GC/CHLAMYDIA PROBE AMP ([place]) NOT AT [facility]     EKG None  Radiology US PELVIC COMPLETE W TRANSVAGINAL AND TORSION R/O  Result Date: 10/19/2020 CLINICAL DATA:  50 year old female with pelvic pain and vaginal bleeding. EXAM: TRANSABDOMINAL AND TRANSVAGINAL ULTRASOUND OF PELVIS DOPPLER ULTRASOUND OF OVARIES TECHNIQUE: Both transabdominal and transvaginal ultrasound examinations of the pelvis were performed. Transabdominal technique was performed for global imaging of the pelvis including uterus, ovaries, adnexal regions, and pelvic cul-de-sac. It was necessary to proceed with endovaginal exam following the transabdominal exam to visualize the ovaries. Color and duplex Doppler ultrasound was utilized to evaluate blood flow to the ovaries. COMPARISON:  Noncontrast CT Abdomen and Pelvis 10/08/2020. FINDINGS: Uterus Measurements: 10.1 x 4.2 x 4.7 cm = volume: 106 mL. Multiple small uterine fibroids as suspected on the CT earlier this month, measuring up to 2.4 cm diameter (left fundus images 16 and 88). Probable small submucosal fibroid measuring 10 mm on image 93. The rest appear to be intramural. Endometrium Thickness: 14 mm. Mild distortion of the endometrial contour from suspected submucosal fibroid (image 93). Mildly heterogeneous endometrium echogenicity (image 98). Right ovary Measurements: 4.7 x 2.3 x 3.0 cm = volume: 17 mL. Normal appearance/no adnexal mass. Left ovary Measurements: 3.4 x 1.6 x 1.9 cm = volume: 5 mL. Within normal limits. Several small echogenic foci associated with the left ovary such as on image 102 appear benign. Occasional small cyst or follicle also noted. Pulsed Doppler evaluation of both ovaries demonstrates normal low-resistance arterial and venous waveforms. Other findings Trace pelvic free fluid. Echogenic urinary debris within the bladder (image 111). IMPRESSION: 1. Heterogeneous endometrium, felt  in part related to submucosal 1 cm fibroid. If bleeding remains unresponsive to hormonal or medical therapy, sonohysterogram  should be considered for focal lesion work-up. (Ref: Radiological Reasoning: Algorithmic Workup of Abnormal Vaginal Bleeding with Endovaginal Sonography and Sonohysterography. AJR 2008; 161:W96-04). 2. Multiple additional small uterine fibroids up to 2.4 cm appear intramural. 3. Ovaries are within normal limits. No evidence of ovarian torsion. 4. Abnormal echogenic debris in the urinary bladder. Consider UTI, correlate with urinalysis. Electronically Signed   By: Odessa Fleming M.D.   On: 10/19/2020 10:31    Procedures Procedures   Medications Ordered in ED Medications  megestrol (MEGACE) tablet 80 mg (80 mg Oral Given 10/19/20 1218)  HYDROcodone-acetaminophen (NORCO/VICODIN) 5-325 MG per tablet 1 tablet (1 tablet Oral Given 10/19/20 0855)  sodium chloride 0.9 % bolus 1,000 mL (1,000 mLs Intravenous Bolus 10/19/20 1146)  cefTRIAXone (ROCEPHIN) 1 g in sodium chloride 0.9 % 100 mL IVPB (1 g Intravenous New Bag/Given 10/19/20 1145)    ED Course  I have reviewed the triage vital signs and the nursing notes.  Pertinent labs & imaging results that were available during my care of the patient were reviewed by me and considered in my medical decision making (see chart for details).  50 year old here for evaluation of vaginal bleeding.  Intermittent since the first of the month.  She is afebrile, nonseptic, non-ill-appearing.  Changing a pad every 2-3 hours.  Initially had clots however none currently.  Has some generalized weakness for no lightheadedness or dizziness.  Seen here in ED for right lower quadrant abdominal pain approximately 10 days ago and did not have mention of this at that time.  We will plan on labs, imaging and reassess.  Labs and imaging personally reviewed and interpreted:  Preg negative CBC without leukocytosis, Hgb 8.5, 3 point drop in 10 days Metabolic panel with creatinine 1.33, glucose 286, GFR 50, labs at baseline INR 1.0 Wet prep with few WBC  [place] with fibroids.  No torsion, no  thickened endometrium.  CONSULT with ObGyn, see note in Epic personally reviewed patient's labs, imaging and chart.  We will plan on treating UTI, IV fluid bolus, Megace per recommendation.  Reassessed after treatment. Ambulatory without difficulty.  No lightheadedness, dizziness.  No tachycardia, hypotension.  Feels improved, would like to go home.  Will hold on transfusion at this time.  Prescription sent for Megace, Keflex and will start on p.o. iron.  She will keep a close follow-up with cardiology given bleeding as a side effect of her Brilinta.  Patient agreeable to follow-up outpatient and feels much improved here in ED.  Close follow-up with OB within the next week for reevaluation and for repeat labs which she is agreeable for this.  She will return for new or worsening symptoms.  TOC consult placed due to patient unable to afford medications prescribed today.  Patient is nontoxic, nonseptic appearing, in no apparent distress.  Patient's pain and other symptoms adequately managed in emergency department.  Fluid bolus given.  Labs, imaging and vitals reviewed.  Patient does not meet the SIRS or Sepsis criteria.  On repeat exam patient does not have a surgical abdomin and there are no peritoneal signs.  No indication of appendicitis, bowel obstruction, bowel perforation, cholecystitis, diverticulitis, PID, torsion or ectopic pregnancy.   The patient has been appropriately medically screened and/or stabilized in the ED. I have low suspicion for any other emergent medical condition which would require further screening, evaluation or treatment in the ED or  require inpatient management.  Patient is hemodynamically stable and in no acute distress.  Patient able to ambulate in department prior to ED.  Evaluation does not show acute pathology that would require ongoing or additional emergent interventions while in the emergency department or further inpatient treatment.  I have discussed the diagnosis  with the patient and answered all questions.  Pain is been managed while in the emergency department and patient has no further complaints prior to discharge.  Patient is comfortable with plan discussed in room and is stable for discharge at this time.  I have discussed strict return precautions for returning to the emergency department.  Patient was encouraged to follow-up with PCP/specialist refer to at discharge.    MDM Rules/Calculators/A&P                           Final Clinical Impression(s) / ED Diagnoses Final diagnoses:  Vaginal bleeding  Pelvic pain  Acute cystitis with hematuria  Anemia, unspecified type    Rx / DC Orders ED Discharge Orders         Ordered    megestrol (MEGACE) 40 MG tablet  2 times daily        10/19/20 1245    cephALEXin (KEFLEX) 500 MG capsule  2 times daily        10/19/20 1245    ferrous sulfate 325 (65 FE) MG tablet  Daily        10/19/20 1245    Ambulatory referral to Obstetrics / Gynecology        10/19/20 1252           Henderly, Britni A, PA-C 10/19/20 1330    Little, Ambrose [place], MD 10/19/20 1525

## 2020-10-19 NOTE — Progress Notes (Incomplete)
Primary Care Provider: Lavinia Sharps, NP Cardiologist: Bryan Lemma, MD Electrophysiologist: None  Clinic Note: No chief complaint on file.   ===================================  ASSESSMENT/PLAN   Problem List Items Addressed This Visit    ST elevation myocardial infarction (STEMI) of anterior wall (HCC) (Chronic)   Relevant Medications   Evolocumab (REPATHA SURECLICK) 140 MG/ML SOAJ   CAD S/P percutaneous coronary angioplasty (Chronic)   Relevant Medications   Evolocumab (REPATHA SURECLICK) 140 MG/ML SOAJ   Coronary artery disease involving native coronary artery of native heart with angina pectoris (HCC) (Chronic)   Relevant Medications   Evolocumab (REPATHA SURECLICK) 140 MG/ML SOAJ   Hyperlipidemia associated with type 2 diabetes mellitus (HCC) (Chronic)   Relevant Medications   Evolocumab (REPATHA SURECLICK) 140 MG/ML SOAJ    Other Visit Diagnoses    Coronary artery disease involving native coronary artery of native heart without angina pectoris    -  Primary   Relevant Medications   Evolocumab (REPATHA SURECLICK) 140 MG/ML SOAJ   Other Relevant Orders   EKG 12-Lead (Completed)   Lipid panel   Hepatic function panel   Ischemic cardiomyopathy       Relevant Medications   Evolocumab (REPATHA SURECLICK) 140 MG/ML SOAJ   Other Relevant Orders   EKG 12-Lead (Completed)   Lipid panel   Hepatic function panel   Chronic combined systolic and diastolic CHF (congestive heart failure) (HCC)       Relevant Medications   Evolocumab (REPATHA SURECLICK) 140 MG/ML SOAJ   Other Relevant Orders   EKG 12-Lead (Completed)   Lipid panel   Hepatic function panel      ===================================  HPI:    Maria Harper is a 50 y.o. female with a PMH below who presents today for ***.  Cardiac History  CAD:   PCI/DES LAD in 2018 [facility])  s/p STEMI 11/2019: Widely patent mid to distal LAD stent, distal LAD is 100% stenosed associated with the  third diagonal.  Unsuccessful PTCA-unable to cross.  Only 70% residual but occluded D3.  RPDA tapers to a small caliber vessel with 80% stenosis.  Severely elevated LVEDP.  Resolved ICM - Chronic HFpEF => post MI EF 45 to 50%.  GR 1 DD. => EF now improved to 55-60%.moderate LVH  HTN, HLD,   DM type 2,   Cocaine Abuse, Tobacco Abuse,   Recent CVA 03/2020,  SALEHA KALP was last seen on ***  Recent Hospitalizations: ***  Reviewed  CV studies:    The following studies were reviewed today: (if available, images/films reviewed: From Epic Chart or Care Everywhere) . April 2021 Cardiac Cath-PTCA (see above) . TTE 11/24/2019-post anterior STEMI: EF mildly reduced 45 to 50%.  GR 1 DD.  Elevated LAP.  No obvious heart WMA.  Moderate-severe AI with mild-moderate Aortic Sclerosis but no Stenosis. . TTE 03/23/2020 (stroke evaluation): EF improved to 55 to 60%.  Significant LVH-notable apical.  Consider cardiac MRI.  Moderate concentric LVH.  GR 1 DD.  Elevated LAP.  Mild to moderate Aortic Sclerosis but no Stenosis.  Moderate AI.  Carotid Dopplers 03/24/2020: Bilateral ICA 1-39%.  Bilateral vertebral arteries normal flow.  Normal bilateral subclavian arteries.  Myoview  Interval History:   Geralyn D Remmel   CV Review of Symptoms (Summary) Cardiovascular ROS: {roscv:310661}  The patient {does/does not:200015} have symptoms concerning for COVID-19 infection (fever, chills, cough, or new shortness of breath).   REVIEWED OF SYSTEMS   ROS   I have reviewed  and (if needed) personally updated the patient's problem list, medications, allergies, past medical and surgical history, social and family history.   PAST MEDICAL HISTORY   Past Medical History:  Diagnosis Date  . Acid reflux   . Cocaine abuse (HCC)   . Coronary artery disease    s/p STEMI 11/2019; PCI/DES to mLAD in 2017 and PCI/DES to dLAD in 11/2019  . Diabetes mellitus without complication (HCC)   . Heart attack (HCC)  04/2016  . High cholesterol   . Hypertension   . Ischemic cardiomyopathy 11/2019  . Tobacco abuse     PAST SURGICAL HISTORY   Past Surgical History:  Procedure Laterality Date  . CORONARY/GRAFT ACUTE MI REVASCULARIZATION N/A 11/23/2019   Procedure: CORONARY/GRAFT ACUTE MI REVASCULARIZATION;  Surgeon: [name], MD;  Location: [facility] INVASIVE CV LAB;  Service: Cardiovascular;  Laterality: N/A;  . LEFT HEART CATH AND CORONARY ANGIOGRAPHY N/A 11/23/2019   Procedure: LEFT HEART CATH AND CORONARY ANGIOGRAPHY;  Surgeon: [name], MD;  Location: [facility] INVASIVE CV LAB;  Service: Cardiovascular;  Laterality: N/A;   April 2021:  Intervention      There is no immunization history on file for this patient.  MEDICATIONS/ALLERGIES   Current Meds  Medication Sig  . Evolocumab (REPATHA SURECLICK) 140 MG/ML SOAJ Inject 140 mg into the skin every 14 (fourteen) days. (Patient taking differently: Inject 140 mg into the skin every 21 ( twenty-one) days.)  . ticagrelor (BRILINTA) 60 MG TABS tablet Take 1 tablet (60 mg total) by mouth 2 (two) times daily.  . [DISCONTINUED] amLODipine (NORVASC) 5 MG tablet Take 1 tablet (5 mg total) by mouth daily. (Patient not taking: No sig reported)    Allergies  Allergen Reactions  . Bee Venom Anaphylaxis  . Contrast Media [Iodinated Diagnostic Agents] Hives and Itching  . Tomato Itching and Rash    SOCIAL HISTORY/FAMILY HISTORY   Reviewed in Epic:  Pertinent findings:  Social History   Tobacco Use  . Smoking status: Current Every Day Smoker    Packs/day: 1.50    Years: 20.00    Pack years: 30.00    Types: Cigarettes  . Smokeless tobacco: Never Used  Vaping Use  . Vaping Use: Never used  Substance Use Topics  . Alcohol use: Not Currently  . Drug use: Yes    Types: "Crack" cocaine    Comment: last used yesterday. States " I do it everyday"    Social History   Social History Narrative  . Not on file    OBJCTIVE -PE, EKG, labs    Wt Readings from Last 3 Encounters:  09/26/20 179 lb 9.6 oz (81.5 kg)  08/27/20 180 lb (81.6 kg)  07/15/20 180 lb 9.6 oz (81.9 kg)    Physical Exam: BP 130/72 (BP Location: Left Arm, Patient Position: Sitting, Cuff Size: Large)   Pulse 80   Ht 5\' 2"  (1.575 m)   Wt 179 lb 9.6 oz (81.5 kg)   BMI 32.85 kg/m  Physical Exam   Adult ECG Report  Rate: *** ;  Rhythm: {rhythm:17366};   Narrative Interpretation: ***  Recent Labs:  ***  Lab Results  Component Value Date   CHOL 166 03/23/2020   HDL 37 (L) 03/23/2020   LDLCALC 90 03/23/2020   TRIG 193 (H) 03/23/2020   CHOLHDL 4.5 03/23/2020   Lab Results  Component Value Date   CREATININE 1.33 (H) 10/19/2020   BUN 19 10/19/2020   NA 136 10/19/2020   K 4.1 10/19/2020  CL 105 10/19/2020   CO2 19 (L) 10/19/2020   CBC Latest Ref Rng & Units 10/19/2020 10/08/2020 03/22/2020  WBC 4.0 - 10.5 K/uL 8.5 6.3 -  Hemoglobin 12.0 - 15.0 g/dL 4.4(I) 11.5(L) 11.6(L)  Hematocrit 36.0 - 46.0 % 26.7(L) 35.8(L) 34.0(L)  Platelets 150 - 400 K/uL 335 198 -    No results found for: TSH  ==================================================  COVID-19 Education: The signs and symptoms of COVID-19 were discussed with the patient and how to seek care for testing (follow up with PCP or arrange E-visit).   The importance of social distancing and COVID-19 vaccination was discussed today. The patient {ACTION; IS/IS [id number]} practicing social distancing & Masking.   I spent a total of ***minutes with the patient spent in direct patient consultation.  Additional time spent with chart review  / charting (studies, outside notes, etc): *** min Total Time: *** min   Current medicines are reviewed at length with the patient today.  (+/- concerns) ***  This visit occurred during the SARS-CoV-2 public health emergency.  Safety protocols were in place, including screening questions prior to the visit, additional usage of staff PPE, and extensive cleaning of  exam room while observing appropriate contact time as indicated for disinfecting solutions.  Notice: This dictation was prepared with Dragon dictation along with smaller phrase technology. Any transcriptional errors that result from this process are unintentional and may not be corrected upon review.  Patient Instructions / Medication Changes & Studies & Tests Ordered   Patient Instructions  Medication Instructions:   Stop losartan  Start Amlodipine 5 mg one tablet daily   Complete current bottle of  Brilinta 90 mg then  Start brilinta 60 mg twice a day  *If you need a refill on your cardiac medications before your next appointment, please call your pharmacy*   Lab Work: Lipid  liver panel If you have labs (blood work) drawn today and your tests are completely normal, you will receive your results only by: Marland Kitchen MyChart Message (if you have MyChart) OR . A paper copy in the mail If you have any lab test that is abnormal or we need to change your treatment, we will call you to review the results.   Testing/Procedures:  Not needed  Follow-Up: At [facility], you and your health needs are our priority.  As part of our continuing mission to provide you with exceptional heart care, we have created designated Provider Care Teams.  These Care Teams include your primary Cardiologist (physician) and Advanced Practice Providers (APPs -  Physician Assistants and Nurse Practitioners) who all work together to provide you with the care you need, when you need it.     Your next appointment:   3 to 4 month(s)  The format for your next appointment:   In Person  Provider:   Bryan Lemma, MD    Studies Ordered:   Orders Placed This Encounter  Procedures  . Lipid panel  . Hepatic function panel  . EKG 12-Lead     Bryan Lemma, M.D., M.S. Interventional Cardiologist   Pager # [phone] Phone # [phone] [address] [place], [place] [address]   Thank you  for choosing Heartcare at [facility]!!

## 2020-10-19 NOTE — Progress Notes (Addendum)
..   Transition of Care [facility]) - Emergency Department Mini Assessment   Patient Details  Name: Maria Harper MRN: [id number] Date of Birth: 08-Jan-1971  Transition of Care [facility]) CM/SW Contact:    Elliot Cousin, RN Phone Number:  [phone] 10/19/2020, 1:33 PM   Clinical Narrative:  TOC CM spoke to pt and states she receives meds with her discount set up with [facility]. She continues to see PCP, Lavinia Sharps NP at [facility]. TOC CSW will assist with transportation home.   Spoke to pt and states she does not have copay for meds at [facility]. Rx sent to [facility] and pt can use one time free fill at [facility] pharmacy. Provided pt with brochure for [facility] with address. Ed provider and RN updated. Pt will was provided bus pass to get to pharmacy tomorrow.   ED Mini Assessment: What brought you to the Emergency Department? : heavy bleeding  Barriers to Discharge: No Barriers Identified  Barrier interventions: transportation arranged  Means of departure: Hospital Transport  Interventions which prevented an admission or readmission: Transportation Screening,Medication Review    Patient Contact and Communications        ,          Patient states their goals for this hospitalization and ongoing recovery are:: transportation      Admission diagnosis:  vaginal bleeding Patient Active Problem List   Diagnosis Date Noted  . Coronary artery disease involving native coronary artery of native heart with angina pectoris (HCC)   . Acute CVA (cerebrovascular accident) (HCC) 03/23/2020  . Hyperglycemia due to type 2 diabetes mellitus (HCC) 03/23/2020  . Essential hypertension 11/28/2019  . UTI (urinary tract infection) 11/28/2019  . Hyperlipidemia associated with type 2 diabetes mellitus (HCC)   . Tobacco abuse   . Type II diabetes mellitus with neurological manifestations, uncontrolled (HCC) 11/25/2019  . ST elevation myocardial  infarction (STEMI) of anterior wall (HCC) 11/23/2019  . CAD S/P percutaneous coronary angioplasty 11/23/2019  . Acute ST elevation myocardial infarction (STEMI) of anteroseptal wall (HCC) 11/23/2019  . Sepsis (HCC) 09/19/2019  . Cocaine abuse with cocaine-induced mood disorder (HCC) 06/10/2017   PCP:  Lavinia Sharps, NP Pharmacy:   [facility] [phone] - [place], [place] - [phone] AT [facility] OF [address] & [address] [place] [address] Phone: [phone] Fax: [phone]  [facility] [place], [place] - [address] [place] [address] Phone: [phone] Fax: [phone]  [facility] [address] [place] - [address] [place] [address] Phone: [phone] Fax: [phone]  [facility] - [place], [place] - [address] [place] [address] Phone: [phone] Fax: [phone]

## 2020-10-19 NOTE — Consult Note (Addendum)
OBSTETRICS AND GYNECOLOGY ATTENDING TELEPHONE CONSULT NOTE   Consult Date: 10/19/2020   Reason for Consult:  Vaginal bleeding, on anticoagulation,  complicated medical history Consulting Provider: Ralph Leyden, PA-C   Consultation Details (from provider and chart review):  Maria Harper is a 50 yo F at [facility] ER who presented with vaginal bleeding since 09/30/2020, reported using 24 pads in 2 days.  PMH remarkable for CAD s/p percutaneous coronary angioplasty, cocaine, prior MI, ischemic cardiomyopathy, CVA, IDDM, long term anticoagulation with empagliflozin. Reported general fatigue.  On evaluation, she was noted to have dark red bleeding, hemoglobin 8.5 (down from 11.5 on 10/08/20).   The provider had a clinical question about management.  I performed a chart review on the patient and reviewed available documentation.  BP 114/71    Pulse 88    Temp 97.7 F (36.5 C) (Oral)    Resp 16    LMP 10/07/2020    SpO2 100%   Exam performed by consulting provider and remarkable for moderate vaginal bleeding  Pertinent labs and imaging:  CBC Latest Ref Rng & Units 10/19/2020 10/08/2020 03/22/2020  WBC 4.0 - 10.5 K/uL 8.5 6.3 -  Hemoglobin 12.0 - 15.0 g/dL 8.4(O) 11.5(L) 11.6(L)  Hematocrit 36.0 - 46.0 % 26.7(L) 35.8(L) 34.0(L)  Platelets 150 - 400 K/uL 335 198 -   US PELVIC COMPLETE W TRANSVAGINAL AND TORSION R/O  Result Date: 10/19/2020 CLINICAL DATA:  50 year old female with pelvic pain and vaginal bleeding. EXAM: TRANSABDOMINAL AND TRANSVAGINAL ULTRASOUND OF PELVIS DOPPLER ULTRASOUND OF OVARIES TECHNIQUE: Both transabdominal and transvaginal ultrasound examinations of the pelvis were performed. Transabdominal technique was performed for global imaging of the pelvis including uterus, ovaries, adnexal regions, and pelvic cul-de-sac. It was necessary to proceed with endovaginal exam following the transabdominal exam to visualize the ovaries. Color and duplex Doppler ultrasound  was utilized to evaluate blood flow to the ovaries. COMPARISON:  Noncontrast CT Abdomen and Pelvis 10/08/2020. FINDINGS: Uterus Measurements: 10.1 x 4.2 x 4.7 cm = volume: 106 mL. Multiple small uterine fibroids as suspected on the CT earlier this month, measuring up to 2.4 cm diameter (left fundus images 16 and 88). Probable small submucosal fibroid measuring 10 mm on image 93. The rest appear to be intramural. Endometrium Thickness: 14 mm. Mild distortion of the endometrial contour from suspected submucosal fibroid (image 93). Mildly heterogeneous endometrium echogenicity (image 98). Right ovary Measurements: 4.7 x 2.3 x 3.0 cm = volume: 17 mL. Normal appearance/no adnexal mass. Left ovary Measurements: 3.4 x 1.6 x 1.9 cm = volume: 5 mL. Within normal limits. Several small echogenic foci associated with the left ovary such as on image 102 appear benign. Occasional small cyst or follicle also noted. Pulsed Doppler evaluation of both ovaries demonstrates normal low-resistance arterial and venous waveforms. Other findings Trace pelvic free fluid. Echogenic urinary debris within the bladder (image 111). IMPRESSION: 1. Heterogeneous endometrium, felt in part related to submucosal 1 cm fibroid. If bleeding remains unresponsive to hormonal or medical therapy, sonohysterogram should be considered for focal lesion work-up. (Ref: Radiological Reasoning: Algorithmic Workup of Abnormal Vaginal Bleeding with Endovaginal Sonography and Sonohysterography. AJR 2008; 962:X52-84). 2. Multiple additional small uterine fibroids up to 2.4 cm appear intramural. 3. Ovaries are within normal limits. No evidence of ovarian torsion. 4. Abnormal echogenic debris in the urinary bladder. Consider UTI, correlate with urinalysis. Electronically Signed   By: Odessa Fleming M.D.   On: 10/19/2020 10:31    Recommendations: This is abnormal  uterine bleeding exacerbated by underlying anticoagulation, leading to symptomatic anemia. Extensive medical  history.  - Megace 80 mg po bid recommended. This will help lessen the blood flow. She is to remain on this until evaluation by GYN in the outpatient setting. Can titrate dose as needed. No need to alter anticoagulation dosage.  - No need for any acute surgical intervention at this time. - Will defer to medical team about blood transfusion. If still symptomatic after IV fluids, she can be transfused with 1-2 pRBCs, can be observed in house if indicated and we can be consulted for recommendations.  -Patient will be referred to the Center for [facility] (any office) for follow up in 1-2 weeks.   Thank you for this telephone consult.  If additional recommendations are needed, please call [phone] [facility] OB/GYN Attending On Call all day, every day) or [phone] [facility] OB/GYN Consult Attending Monday-Friday 8am - 5pm).   I spent approximately 5 minutes directly consulting with the provider and verbally discussing this case. Additional 20 minutes was spent performing chart review and documentation.    Jaynie Collins, MD Obstetrician & Gynecologist, [facility] for [employer], [facility]

## 2020-10-19 NOTE — Discharge Instructions (Signed)
Your ultrasound did show fibroids in your uterus.  Your urine did show urinary tract infection I have written you for antibiotics, Keflex.  I started a medication called Megace as well as iron.  Take as prescribed.  Stay on the Megace until you follow-up with OB/GYN.

## 2020-10-20 ENCOUNTER — Encounter: Payer: Self-pay | Admitting: Cardiology

## 2020-10-20 LAB — GC/CHLAMYDIA PROBE AMP (~~LOC~~) NOT AT ARMC
Chlamydia: NEGATIVE
Comment: NEGATIVE
Comment: NORMAL
Neisseria Gonorrhea: NEGATIVE

## 2020-10-20 MED FILL — FERROUS SULFATE 325 MG TAB: 325 (65 FE) | 30 days supply | Qty: 30 | Fill #0

## 2020-10-20 MED FILL — MEGESTROL 40 MG TABLET: 40 | 14 days supply | Qty: 56 | Fill #0

## 2020-10-20 MED FILL — CEPHALEXIN 500 MG CAPSULE: 500 | 7 days supply | Qty: 14 | Fill #0

## 2020-10-20 NOTE — Assessment & Plan Note (Signed)
She says she is not used any recently.  I explained her that going back to using would be a very very poor decision.

## 2020-10-20 NOTE — Assessment & Plan Note (Addendum)
Supposedly she is still taking her Repatha which was restarted in December.  She should have lipids checked soon.  Continue atorvastatin if tolerated.  I do not know if she was taking it. We will order lipids and chemistry.  Being followed by our Lipid Clinic Pharmacists  She is on empagliflozin (GLP-1 agonist) for diabetes along with insulin 70-30 as well as meal coverage

## 2020-10-20 NOTE — Assessment & Plan Note (Signed)
Per neurology, remains on aspirin and Brilinta.

## 2020-10-20 NOTE — Assessment & Plan Note (Addendum)
In the distal anterior wall is pretty much not reperfused following the last MI.  Interestingly, there is increased thickness in that region and no obvious wall motion normality on echo.  The Myoview read was a little unusual.  There is definitely what appears to be a fixed/not reversible perfusion defect in the distal anterior apical wall and apex.  This would go along with her known anatomy.  Interestingly though although it appears to be full-thickness, the echo does not show wall motion normality in that distribution.  I personally reviewed the echo and I do think that there is a little bit of apical dyskinesis.  Regardless the apex is very thick and this could be why this is found.  I think her symptoms are probably spasm related-she is has a history of cocaine use and is still smoking.  She has a distal LAD distribution and diagonal branch which are probably getting some collateral flow.

## 2020-10-20 NOTE — Assessment & Plan Note (Addendum)
Still having chest pain now her Myoview really did not show any ischemia.  As I discussed in the MI section, I think the large fixed defect seen on my view is consistent with what we would know about her known anatomy and anterior wall infarct.  Her LAD distally was not revascularizable and nor was the distal diagonal branch.  It was stated to reason that there would be an infarct in this distribution.  I suspect her pain is related to spasm and microvascular disease.  Plan:  Continue Imdur 90 mg daily  DC losartan 25 mg and start amlodipine 5 mg daily.  (Not sure why the says it is discontinued)  Continue carvedilol at current dose.  With her body habitus, this is probably max dose.  Continue empagliflozin  Continue aspirin and Brilinta, but okay to reduce to 60 mg daily -> with recent stroke, will continue aspirin plus Brilinta, but as of May 2021, okay to hold for procedures 5 days preop

## 2020-10-20 NOTE — Assessment & Plan Note (Signed)
She is still on aspirin and Brilinta.  We are reducing Brilinta to 60 mg now -> when she completes her current bottle.  Continue aspirin plus Brilinta because of recent stroke.  But okay to hold for procedures (5 days preop) as of May 2021

## 2020-10-20 NOTE — Assessment & Plan Note (Signed)
Blood pressure is pretty well controlled.  My plan was to add amlodipine for antianginal benefit and discontinue losartan.

## 2020-10-20 NOTE — Assessment & Plan Note (Addendum)
I counseled her on smoking cessation.  Does not seem to be interested in stopping.

## 2020-10-21 ENCOUNTER — Telehealth: Payer: Self-pay | Admitting: Cardiology

## 2020-10-21 LAB — URINE CULTURE: Culture: >=100000 — AB

## 2020-10-21 NOTE — Telephone Encounter (Signed)
Spoke to patient's husband Dr.Harding's advice given.She will hold Brilinta for 7 days then restart.

## 2020-10-21 NOTE — Telephone Encounter (Signed)
Returned call to patient's husband.Stated wife went to [facility] ED this past Sunday with vaginal bleeding.Stated she did not have her period in Feb.Stated she started her period in early March and she continues to bleed.Stated she was told to call Dr.Harding to see if she needs to continue Brilinta.Advised to keep taking Brilinta.She has appointment with GYN 11/10/20.Advised I will send message to Dr.Harding.

## 2020-10-21 NOTE — Telephone Encounter (Signed)
I think if she is having abnormal bleeding, it is okay to hold Brilinta for 7 days.  She is almost a complete year out from her last event.  This way, all bleeding can heal up, and then we can restart.  We will eventually be reducing dose to 60 mg twice daily.  Bryan Lemma, MD

## 2020-10-21 NOTE — Telephone Encounter (Signed)
New message:    Patient went to the ER and states that she has been bleeding since being of march and the doctors there told them to call to see if patient should be taking Brilinta

## 2020-10-22 ENCOUNTER — Telehealth: Payer: Self-pay | Admitting: Emergency Medicine

## 2020-10-22 NOTE — Telephone Encounter (Signed)
Post ED Visit - Positive Culture Follow-up  Culture report reviewed by antimicrobial stewardship pharmacist: [facility] []  Nathan Batchelder, Pharm.D. []  , .D., BCPS AQ-ID []  Celedonio Miyamoto, Pharm.D., BCPS []  [address], Pharm.D., BCPS []  [place], Garvin Fila.D., BCPS, AAHIVP []  , Pharm.D., BCPS, AAHIVP []  Georgina Pillion, PharmD, BCPS []  , PharmD, BCPS []  [place], PharmD, BCPS []  [place], PharmD []  , PharmD, BCPS []  Estella Husk, PharmD  Pharmacy Team []  Lysle Pearl, PharmD []  , PharmD []  Phillips Climes, PharmD []  , Rph []  Agapito Games) , PharmD []  Verlan Friends, PharmD []  , PharmD []  Mervyn Gay, PharmD []  , PharmD []  Vinnie Level, PharmD []  [facility], PharmD []  , PharmD []  Len Childs, PharmD   Positive urine culture Treated with cephalexin, symptom check, no answer due to patient being at her PCP for followup appt, no further patient follow-up is required at this time.  10/22/2020, 10:44 AM

## 2020-10-24 ENCOUNTER — Encounter: Payer: Self-pay | Admitting: Cardiology

## 2020-10-24 NOTE — Telephone Encounter (Signed)
Left message for patients husband, patient will need to follow up with her GYN or medical doctor for the bleeding, should not be coming from the brilinta now. He is to call back with questions.

## 2020-10-24 NOTE — Telephone Encounter (Signed)
Patient's husband states the patient has not taken the brilinta for a few days and is still having symptoms. He would like to know what to do next and whether she needs to be seen.

## 2020-10-24 NOTE — Telephone Encounter (Signed)
error 

## 2020-11-10 ENCOUNTER — Encounter: Payer: Self-pay | Admitting: Obstetrics and Gynecology

## 2020-11-11 NOTE — Progress Notes (Signed)
Patient did not keep her GYN ED referral appointment for 11/10/2020.  Cornelia Copa MD Attending Center for [employer] [facility])

## 2020-11-12 ENCOUNTER — Ambulatory Visit: Payer: Self-pay

## 2020-11-12 NOTE — Progress Notes (Deleted)
Patient ID: ETHYLENE REZNICK                 DOB: 1970/09/02                      MRN: [id number]     HPI:  CATALYNA REILLY is a 50 y.o. female referred by Dr. Georgiann Hahn to HTN clinic. PMH includes cocaine abuse, and cocaine-induced mood disorder, tobacco abuse,CAD, CVA, STEMI, ischemic cardiomyopathy hyperlipidemia associated with DM-II, and hypertension.  Current HTN meds:  Amlodipine 5mg  daily ?? Carvedilol 25mg  twice daily Isosorbide mononitrate 60mg  daily Losartan 25mg  daily?  BP goal: <130/80  Family History:   Social History:   Diet:   Exercise:   Home BP readings:   Wt Readings from Last 3 Encounters:  09/26/20 179 lb 9.6 oz (81.5 kg)  08/27/20 180 lb (81.6 kg)  07/15/20 180 lb 9.6 oz (81.9 kg)   BP Readings from Last 3 Encounters:  10/19/20 93/79  10/08/20 (!) 147/92  09/26/20 130/72   Pulse Readings from Last 3 Encounters:  10/19/20 81  10/08/20 (!) 52  09/26/20 80    Past Medical History:  Diagnosis Date  . Acid reflux   . Cocaine abuse (HCC)   . Coronary artery disease, occlusive 2017   2017 or 2018-PCI to LAD (mid-distal); -> anterior STEMI April 2021 -> LAD stent patent, however distal LAD at D3 occluded, only able to reduce to 70% stenosis.  Small caliber RPDA with 80% stenosis.  . Diabetes mellitus without complication (HCC)   . Heart attack (HCC) 04/2016  . High cholesterol   . Hypertension   . Ischemic cardiomyopathy 11/2019  . Tobacco abuse     Current Outpatient Medications on File Prior to Visit  Medication Sig Dispense Refill  . acetaminophen (TYLENOL) 500 MG tablet Take 1,000 mg by mouth in the morning, at noon, and at bedtime.    2018 aspirin 81 MG chewable tablet Chew 81 mg by mouth daily.    May 2021 atorvastatin (LIPITOR) 80 MG tablet Take 1 tablet (80 mg total) by mouth daily at 6 PM. 90 tablet 3  . buPROPion (WELLBUTRIN SR) 100 MG 12 hr tablet Take 100 mg by mouth 2 (two) times daily.    . carvedilol (COREG) 25 MG tablet Take 1 tablet  (25 mg total) by mouth 2 (two) times daily with a meal. 180 tablet 3  . cephALEXin (KEFLEX) 500 MG capsule TAKE 1 CAPSULE (500 MG TOTAL) BY MOUTH 2 (TWO) TIMES DAILY FOR 7 DAYS. 14 capsule 0  . cyclobenzaprine (FLEXERIL) 10 MG tablet Take 10 mg by mouth 3 (three) times daily as needed for muscle spasms.     05/2016 doxepin (SINEQUAN) 50 MG capsule Take 50 mg by mouth in the morning, at noon, and at bedtime.    . empagliflozin (JARDIANCE) 10 MG TABS tablet Take 1 tablet (10 mg total) by mouth daily before breakfast. 90 tablet 3  . Evolocumab (REPATHA SURECLICK) 140 MG/ML SOAJ Inject 140 mg into the skin every 14 (fourteen) days. (Patient taking differently: Inject 140 mg into the skin every 21 ( twenty-one) days.) 6 mL 4  . ferrous sulfate 325 (65 FE) MG tablet Take 1 tablet (325 mg total) by mouth daily. 30 tablet 0  . ferrous sulfate 325 (65 FE) MG tablet TAKE 1 TABLET (325 MG TOTAL) BY MOUTH DAILY. 30 tablet 0  . gabapentin (NEURONTIN) 300 MG capsule Take 300 mg by mouth 3 (three) times daily.    12/2019  HUMULIN R 100 UNIT/ML injection Inject 16 Units into the skin 2 (two) times daily before a meal.  0  . HYDROcodone-acetaminophen (NORCO/VICODIN) 5-325 MG tablet Take 1 tablet by mouth every 6 (six) hours as needed. 10 tablet 0  . insulin NPH-regular Human (70-30) 100 UNIT/ML injection Inject 40 Units into the skin 2 (two) times daily.    . isosorbide mononitrate (IMDUR) 60 MG 24 hr tablet Take 1.5 tablets (90 mg total) by mouth daily. 135 tablet 3  . lidocaine (LIDODERM) 5 % Place 1 patch onto the skin daily. Remove & Discard patch within 12 hours or as directed by MD 30 patch 0  . loratadine (CLARITIN) 10 MG tablet Take 10 mg by mouth daily.    Marland Kitchen losartan (COZAAR) 50 MG tablet Take 50 mg by mouth daily.    . megestrol (MEGACE) 40 MG tablet TAKE 2 TABLETS (80 MG TOTAL) BY MOUTH 2 (TWO) TIMES DAILY FOR 14 DAYS. 56 tablet 0  . nitroGLYCERIN (NITROSTAT) 0.4 MG SL tablet Place 1 tablet (0.4 mg total) under the  tongue as needed for chest pain. 25 tablet 2  . pantoprazole (PROTONIX) 40 MG tablet Take 40 mg by mouth 2 (two) times daily.     . polyethylene glycol powder (GLYCOLAX/MIRALAX) powder Take 17 g by mouth daily as needed for moderate constipation.     Marland Kitchen PRESCRIPTION MEDICATION Inhale 1-2 puffs into the lungs as needed (Shortness of Breath / Breathing.). "patient calls it smokers inhaler"    . QUEtiapine (SEROQUEL) 200 MG tablet Take 200 mg by mouth daily.     . QUEtiapine (SEROQUEL) 400 MG tablet Take 400 mg by mouth at bedtime.    . ticagrelor (BRILINTA) 60 MG TABS tablet Take 1 tablet (60 mg total) by mouth 2 (two) times daily. 180 tablet 3  . [DISCONTINUED] amLODipine (NORVASC) 5 MG tablet Take 1 tablet (5 mg total) by mouth daily. (Patient not taking: No sig reported) 90 tablet 3   No current facility-administered medications on file prior to visit.    Allergies  Allergen Reactions  . Bee Venom Anaphylaxis  . Contrast Media [Iodinated Diagnostic Agents] Hives and Itching  . Tomato Itching and Rash    There were no vitals taken for this visit.  No problem-specific Assessment & Plan notes found for this encounter.    Namiko Pritts Rodriguez-Guzman PharmD, BCPS, CPP [facility] [address] [place] [address] 11/12/2020 8:32 AM

## 2020-11-20 ENCOUNTER — Telehealth: Payer: Self-pay | Admitting: Cardiology

## 2020-11-20 NOTE — Telephone Encounter (Signed)
Left message to call back  

## 2020-11-20 NOTE — Telephone Encounter (Signed)
Would hold brilinta indefinitely given recurrent bleeding and anemia; not on for stent but for h/o CVA; needs fu with GYN Olga Millers

## 2020-11-20 NOTE — Telephone Encounter (Signed)
New Message:     Pt had a CBC test yesterday. She was tod her Hemoglobin was real low, below 8. She was told to contact he Cardiologist asap.

## 2020-11-20 NOTE — Telephone Encounter (Signed)
Spoke to husband who reports patient went to PCP yesterday due to vaginal bleeding and lab work showed her Hmg to be less than 8( he is unsure of exact level ~7 range).  He states PCP recommended ER evaluation or starting iron pills to see if this helped.  They started iron pills.  PCP wanted them to contact cardiology to discuss Brilinta.  Husband reports patient being SOB and dizzy, reports this has been ongoing.    Per chart review:   ED visit 3/20 for vaginal bleeding, hmg 8.5 Phone note 3/22-Dr. Herbie Baltimore recommended holding x 7 days and follow up with OBGYN on 4/11 Patient held Brilinta, bleeding stopped, restarted medication, bleeding has restarted.  Not heavy bleeding as previously.   Blood on toilet paper every time she wipes.  Denies blood in stool or dark stools.      Note in chart 4/11-patient did not keep her GYN ED referral.     Husband is wondering if they need to stop Brilinta again.    Advised would route to MD and DOD (for urgent recommendations) and to proceed to ER if patient is symptomatic.

## 2020-11-21 NOTE — Telephone Encounter (Signed)
PT is calling [employer] back.Please

## 2020-11-21 NOTE — Telephone Encounter (Signed)
Pt's husband updated and verbalized understanding.

## 2020-11-26 ENCOUNTER — Other Ambulatory Visit: Payer: Self-pay

## 2020-11-26 ENCOUNTER — Emergency Department (HOSPITAL_COMMUNITY): Payer: Self-pay

## 2020-11-26 ENCOUNTER — Encounter (HOSPITAL_COMMUNITY): Payer: Self-pay

## 2020-11-26 ENCOUNTER — Emergency Department (HOSPITAL_COMMUNITY)
Admission: EM | Admit: 2020-11-26 | Discharge: 2020-11-26 | Disposition: A | Discharge status: Home / Self Care | Payer: Self-pay | Attending: Emergency Medicine | Admitting: Emergency Medicine

## 2020-11-26 ENCOUNTER — Other Ambulatory Visit (HOSPITAL_COMMUNITY): Payer: Self-pay

## 2020-11-26 DIAGNOSIS — E119 Type 2 diabetes mellitus without complications: Secondary | ICD-10-CM | POA: Insufficient documentation

## 2020-11-26 DIAGNOSIS — Z7982 Long term (current) use of aspirin: Secondary | ICD-10-CM | POA: Insufficient documentation

## 2020-11-26 DIAGNOSIS — I10 Essential (primary) hypertension: Secondary | ICD-10-CM | POA: Insufficient documentation

## 2020-11-26 DIAGNOSIS — R110 Nausea: Secondary | ICD-10-CM | POA: Insufficient documentation

## 2020-11-26 DIAGNOSIS — F17210 Nicotine dependence, cigarettes, uncomplicated: Secondary | ICD-10-CM | POA: Insufficient documentation

## 2020-11-26 DIAGNOSIS — J189 Pneumonia, unspecified organism: Secondary | ICD-10-CM

## 2020-11-26 DIAGNOSIS — I25119 Atherosclerotic heart disease of native coronary artery with unspecified angina pectoris: Secondary | ICD-10-CM | POA: Insufficient documentation

## 2020-11-26 DIAGNOSIS — R0789 Other chest pain: Secondary | ICD-10-CM

## 2020-11-26 DIAGNOSIS — J181 Lobar pneumonia, unspecified organism: Secondary | ICD-10-CM | POA: Insufficient documentation

## 2020-11-26 DIAGNOSIS — Z955 Presence of coronary angioplasty implant and graft: Secondary | ICD-10-CM | POA: Insufficient documentation

## 2020-11-26 DIAGNOSIS — Z79899 Other long term (current) drug therapy: Secondary | ICD-10-CM | POA: Insufficient documentation

## 2020-11-26 DIAGNOSIS — Z794 Long term (current) use of insulin: Secondary | ICD-10-CM | POA: Insufficient documentation

## 2020-11-26 LAB — TROPONIN I (HIGH SENSITIVITY): Troponin I (High Sensitivity): 7 ng/L (ref <18)

## 2020-11-26 LAB — BASIC METABOLIC PANEL
Anion gap: 13 (ref 5–15)
BUN: 11 mg/dL (ref 6–20)
CO2: 19 mmol/L — ABNORMAL LOW (ref 22–32)
Calcium: 9 mg/dL (ref 8.9–10.3)
Chloride: 104 mmol/L (ref 98–111)
Creatinine, Ser: 1.25 mg/dL — ABNORMAL HIGH (ref 0.44–1.00)
GFR, Estimated: 53 mL/min — ABNORMAL LOW (ref >60)
Glucose, Bld: 170 mg/dL — ABNORMAL HIGH (ref 70–99)
Potassium: 3.5 mmol/L (ref 3.5–5.1)
Sodium: 136 mmol/L (ref 135–145)

## 2020-11-26 LAB — CBC
HCT: 27.3 % — ABNORMAL LOW (ref 36.0–46.0)
Hemoglobin: 7.9 g/dL — ABNORMAL LOW (ref 12.0–15.0)
MCH: 26.2 pg (ref 26.0–34.0)
MCHC: 28.9 g/dL — ABNORMAL LOW (ref 30.0–36.0)
MCV: 90.7 fL (ref 80.0–100.0)
Platelets: 287 10*3/uL (ref 150–400)
RBC: 3.01 MIL/uL — ABNORMAL LOW (ref 3.87–5.11)
RDW: 22.9 % — ABNORMAL HIGH (ref 11.5–15.5)
WBC: 20.3 10*3/uL — ABNORMAL HIGH (ref 4.0–10.5)
nRBC: 1.2 % — ABNORMAL HIGH (ref 0.0–0.2)

## 2020-11-26 LAB — I-STAT BETA HCG BLOOD, ED (MC, WL, AP ONLY): I-stat hCG, quantitative: <5 m[IU]/mL (ref <5)

## 2020-11-26 IMAGING — DX DG CHEST 2V
2 series · 2 of 2 positions shown · non-contrast
Comparison: [DATE].

CLINICAL DATA: Chest pain.

EXAM:
CHEST - 2 VIEW

[w chest pa]
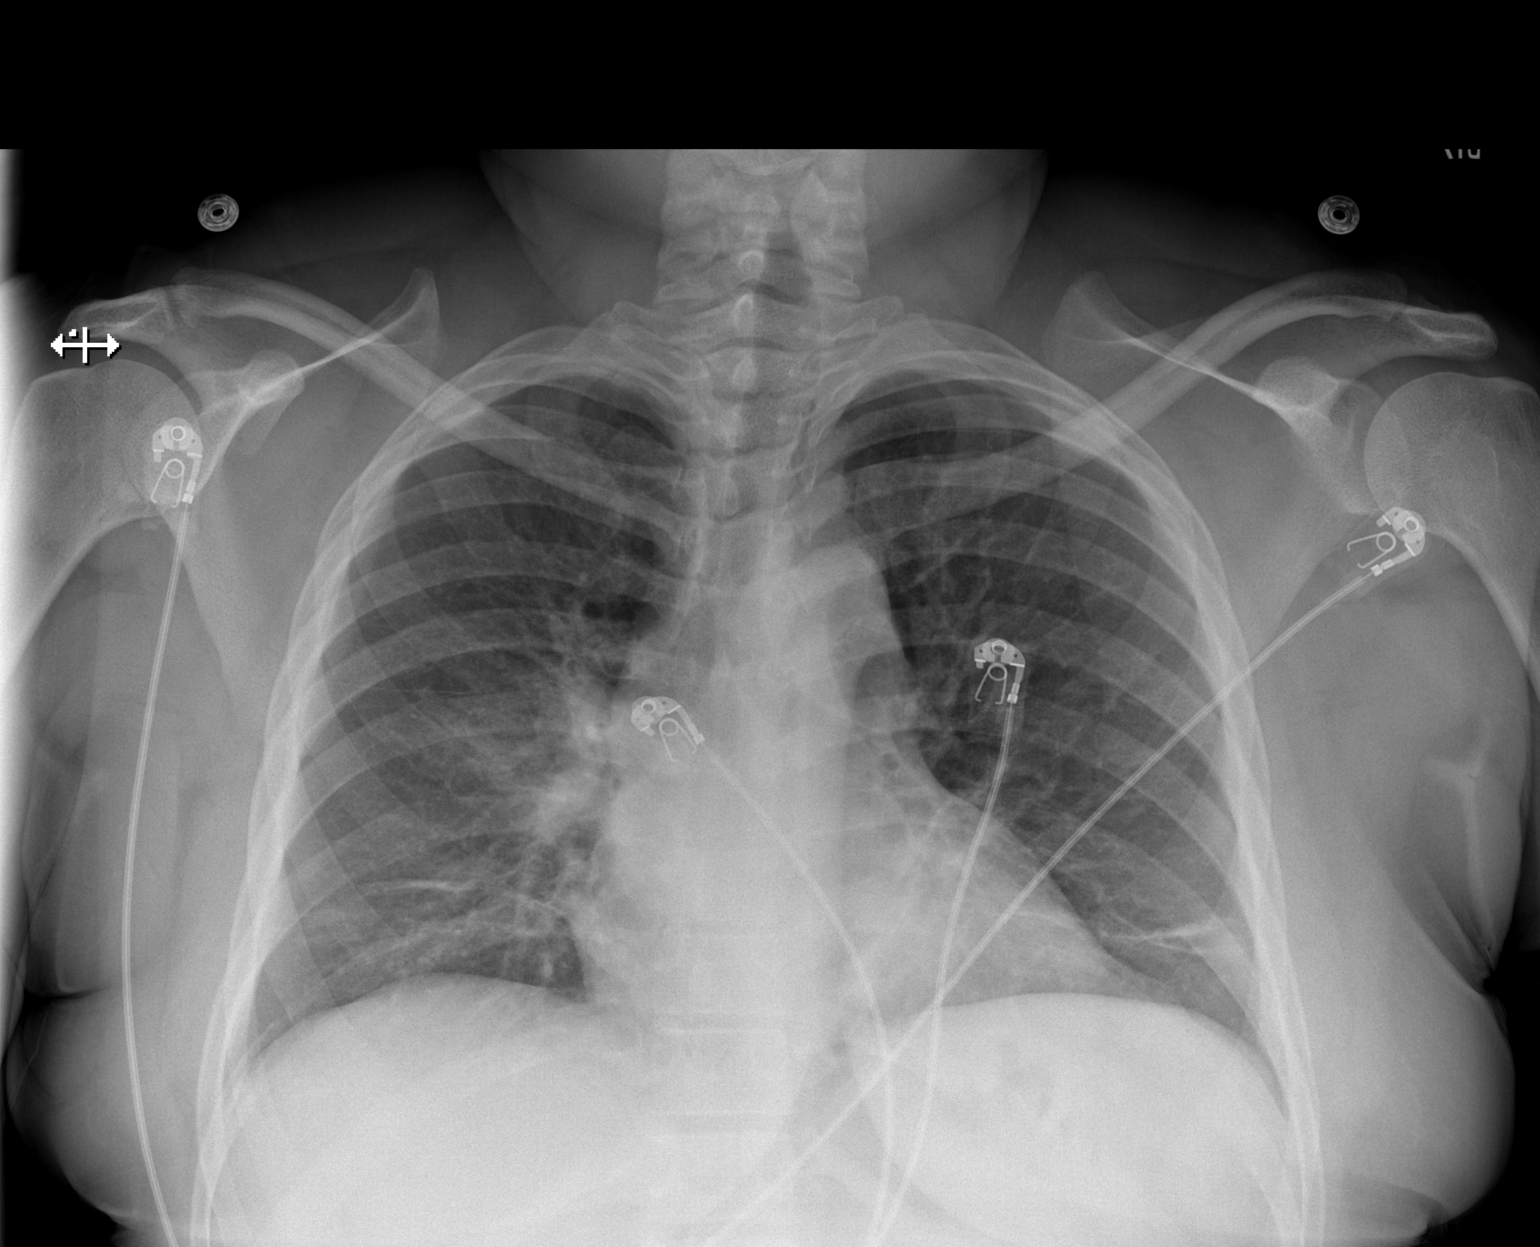

[w chest lat]
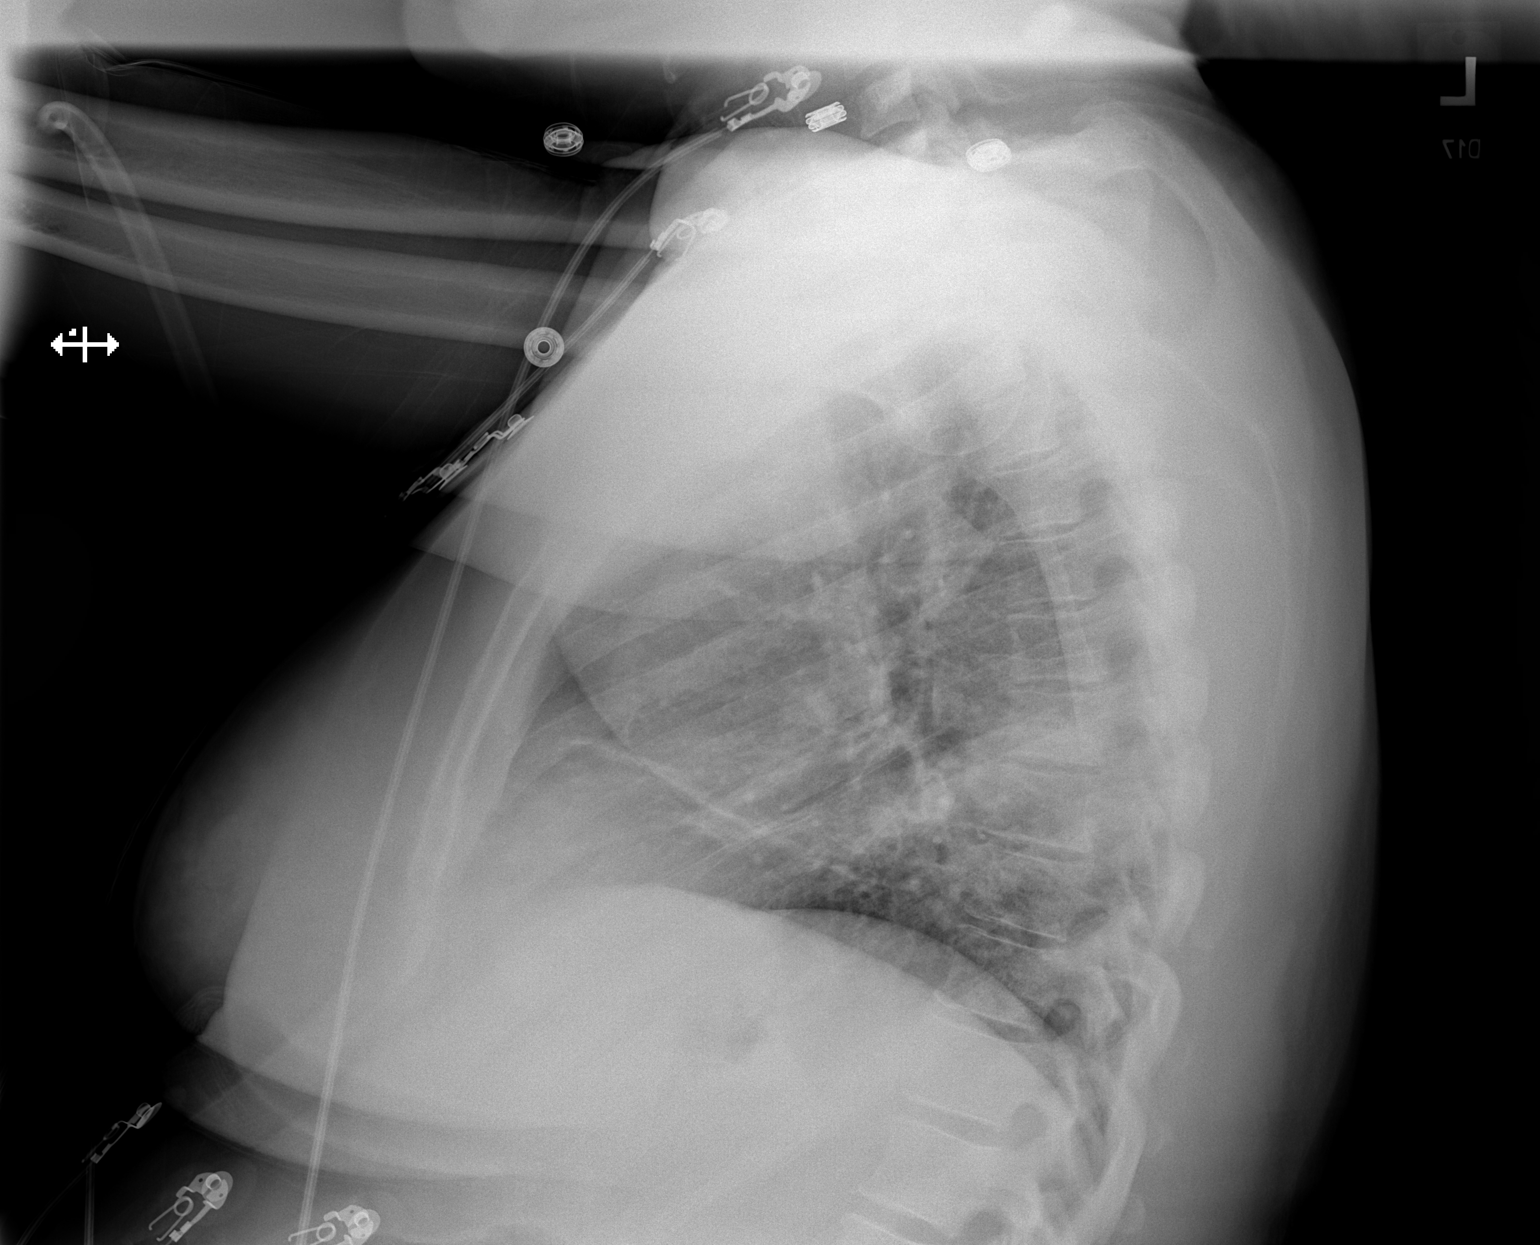

[2 of 2 positions shown; findings below may reference images not displayed]

FINDINGS: Mediastinum hilar structures normal. Heart size normal. Right
perihilar and bibasilar infiltrates. Bibasilar atelectasis and or
scarring again noted. No pleural effusion or pneumothorax. No acute
bony abnormality.
IMPRESSION: Right perihilar and bibasilar infiltrates most consistent with
pneumonia. Bibasilar atelectasis and or scarring again noted.

## 2020-11-26 MED ORDER — AMOXICILLIN-POT CLAVULANATE 875-125 MG PO TABS
1.0000 | ORAL_TABLET | Freq: Once | ORAL | Status: AC
  Administered 2020-11-26: 1 via ORAL
  Filled 2020-11-26: qty 1

## 2020-11-26 MED ORDER — AZITHROMYCIN 250 MG PO TABS
500.0000 mg | ORAL_TABLET | Freq: Once | ORAL | Status: AC
  Administered 2020-11-26: 500 mg via ORAL
  Filled 2020-11-26: qty 2

## 2020-11-26 MED ORDER — AZITHROMYCIN 250 MG PO TABS
250.0000 mg | ORAL_TABLET | Freq: Every day | ORAL | 0 refills | Status: AC
  Filled 2020-11-26: qty 4, 4d supply, fill #0

## 2020-11-26 MED ORDER — AMOXICILLIN-POT CLAVULANATE 875-125 MG PO TABS
1.0000 | ORAL_TABLET | Freq: Two times a day (BID) | ORAL | 0 refills | Status: DC
  Filled 2020-11-26: qty 14, 7d supply, fill #0

## 2020-11-26 NOTE — ED Notes (Signed)
PT requested to speak with Social Worker for medication help. Social Work was contacted, but PT decided she didn't want to wait. PT got up and walked out.

## 2020-11-26 NOTE — Discharge Instructions (Signed)
As discussed, you have been diagnosed with pneumonia.  Susceptibility for diseases like pneumonia increases with smoking cigarettes.  As you complete your therapy, and recover, please focus on smoking cessation.  Please take all medication as prescribed and follow-up with our community health clinic.  Return here for concerning changes in your condition.

## 2020-11-26 NOTE — ED Provider Notes (Signed)
[facility] EMERGENCY DEPARTMENT Provider Note   CSN: [id number] Arrival date & time: 11/26/20  1203     History Chief Complaint  Patient presents with  . Chest Pain    Maria Harper is a 50 y.o. female.  HPI Patient with a history notable for prior stroke presents with chest pain.  Pain began about 8 hours prior to ED arrival, spontaneously.  Pain awakens her from sleeping.  Since that time pain has been persistent, though slightly improved with aspirin and nitroglycerin.  Pain is sternal, sharp, pressure-like, nonradiating.  There is some associated nausea, but no vomiting, no abdominal pain. Patient smokes, has a noted history of cocaine abuse.  Patient also has history of prior cardiac stenting.    Past Medical History:  Diagnosis Date  . Acid reflux   . Cocaine abuse (HCC)   . Coronary artery disease, occlusive 2017   2017 or 2018-PCI to LAD (mid-distal); -> anterior STEMI April 2021 -> LAD stent patent, however distal LAD at D3 occluded, only able to reduce to 70% stenosis.  Small caliber RPDA with 80% stenosis.  . Diabetes mellitus without complication (HCC)   . Heart attack (HCC) 04/2016  . High cholesterol   . Hypertension   . Ischemic cardiomyopathy 11/2019  . Tobacco abuse     Patient Active Problem List   Diagnosis Date Noted  . Coronary artery disease involving native coronary artery of native heart with angina pectoris (HCC)   . Acute CVA (cerebrovascular accident) (HCC) 03/23/2020  . Hyperglycemia due to type 2 diabetes mellitus (HCC) 03/23/2020  . Essential hypertension 11/28/2019  . UTI (urinary tract infection) 11/28/2019  . Hyperlipidemia associated with type 2 diabetes mellitus (HCC)   . Tobacco abuse   . Type II diabetes mellitus with neurological manifestations, uncontrolled (HCC) 11/25/2019  . ST elevation myocardial infarction (STEMI) of anterior wall (HCC) 11/23/2019  . CAD S/P percutaneous coronary angioplasty 11/23/2019   . Acute ST elevation myocardial infarction (STEMI) of anteroseptal wall (HCC) 11/23/2019  . Sepsis (HCC) 09/19/2019  . Cocaine abuse with cocaine-induced mood disorder (HCC) 06/10/2017    Past Surgical History:  Procedure Laterality Date  . CORONARY/GRAFT ACUTE MI REVASCULARIZATION N/A 11/23/2019   Procedure: CORONARY/GRAFT ACUTE MI REVASCULARIZATION;  Surgeon: [name], MD;  Location: [facility] INVASIVE CV LAB;  Service: Cardiovascular; unsuccessful PTCA of distal LAD 100%.  O able to restore only minimal flow with 70% stenosis  . LEFT HEART CATH AND CORONARY ANGIOGRAPHY N/A 11/23/2019   Procedure: LEFT HEART CATH AND CORONARY ANGIOGRAPHY;  Surgeon: [name], MD;  Location: [facility] INVASIVE CV LAB;  Service: Cardiovascular; ANTERIOR STEMI: Widely patent mid to distal LAD stent, distal LAD is 100% stenosed associated with the third diagonal.  (Post PTCA - 70% residual but 100% D3).  RPDA tapers to a small vessel -80% stenosis.  Severely elevated LVEDP  . NM MYOVIEW LTD  08/27/2020   (recurrent chest pain): Poor study due to gut attenuation.  EF estimated 50 and 55%.  No EKG changes.  Large size severe defect in the basal inferior, mid anterior mid inferior, mid inferolateral, apical anterior, apical inferior and apical location either related to artifact due to get uptake vs large infarct.  COMPARED to TTE (03/2020), EF s/ nl EF & no RWA - contradicts this finding. ->> Cor CTA  . TRANSTHORACIC ECHOCARDIOGRAM  11/24/2019   -post anterior STEMI: EF mildly reduced 45 to 50%.  GR 1 DD.  Elevated LAP.  No  obvious heart WMA.  Moderate-severe AI with mild-moderate Aortic Sclerosis but no Stenosis.  . TRANSTHORACIC ECHOCARDIOGRAM  03/23/2020   (stroke evaluation): EF improved to 55 to 60%.  Significant LVH-notable apical.  Consider cardiac MRI.  Moderate concentric LVH.  GR 1 DD.  Elevated LAP.  Mild to moderate Aortic Sclerosis but no Stenosis.  Moderate AI.      OB History   No obstetric history on  file.     Family History  Problem Relation Age of Onset  . Diabetes Mother   . Diabetes Father   . Diabetes Maternal Grandmother   . Diabetes Maternal Grandfather     Social History   Tobacco Use  . Smoking status: Current Every Day Smoker    Packs/day: 1.50    Years: 20.00    Pack years: 30.00    Types: Cigarettes  . Smokeless tobacco: Never Used  Vaping Use  . Vaping Use: Never used  Substance Use Topics  . Alcohol use: Not Currently  . Drug use: Yes    Types: "Crack" cocaine    Comment: last used yesterday. States " I do it everyday"     Home Medications Prior to Admission medications   Medication Sig Start Date End Date Taking? Authorizing Provider  amoxicillin-clavulanate (AUGMENTIN) 875-125 MG tablet Take 1 tablet by mouth every 12 (twelve) hours. 11/26/20  Yes Gerhard Munch, MD  azithromycin (ZITHROMAX) 250 MG tablet Take 1 tablet (250 mg total) by mouth daily for 4 days. Take 1 every day until finished. 11/26/20 11/30/20 Yes Gerhard Munch, MD  acetaminophen (TYLENOL) 500 MG tablet Take 1,000 mg by mouth in the morning, at noon, and at bedtime. 09/23/20   [provider]  aspirin 81 MG chewable tablet Chew 81 mg by mouth daily.    [provider]  atorvastatin (LIPITOR) 80 MG tablet Take 1 tablet (80 mg total) by mouth daily at 6 PM. 07/15/20   Kroeger, Ovidio Kin., PA-C  buPROPion [facility] SR) 100 MG 12 hr tablet Take 100 mg by mouth 2 (two) times daily.    [provider]  carvedilol (COREG) 25 MG tablet Take 1 tablet (25 mg total) by mouth 2 (two) times daily with a meal. 07/15/20   Kroeger, Dot Lanes M., PA-C  cephALEXin (KEFLEX) 500 MG capsule TAKE 1 CAPSULE (500 MG TOTAL) BY MOUTH 2 (TWO) TIMES DAILY FOR 7 DAYS. 10/19/20 10/19/21  Henderly, Britni A, PA-C  cyclobenzaprine (FLEXERIL) 10 MG tablet Take 10 mg by mouth 3 (three) times daily as needed for muscle spasms.     [provider]  doxepin (SINEQUAN) 50 MG capsule Take 50 mg  by mouth in the morning, at noon, and at bedtime.    [provider]  empagliflozin (JARDIANCE) 10 MG TABS tablet Take 1 tablet (10 mg total) by mouth daily before breakfast. 07/15/20   Kroeger, Ovidio Kin., PA-C  Evolocumab (REPATHA SURECLICK) 140 MG/ML SOAJ Inject 140 mg into the skin every 14 (fourteen) days. Patient taking differently: Inject 140 mg into the skin every 21 ( twenty-one) days. 09/26/20   Kroeger, Ovidio Kin., PA-C  ferrous sulfate 325 (65 FE) MG tablet Take 1 tablet (325 mg total) by mouth daily. 10/19/20   Henderly, Britni A, PA-C  ferrous sulfate 325 (65 FE) MG tablet TAKE 1 TABLET (325 MG TOTAL) BY MOUTH DAILY. 10/19/20 10/19/21  Henderly, Britni A, PA-C  gabapentin (NEURONTIN) 300 MG capsule Take 300 mg by mouth 3 (three) times daily.    [provider]  HUMULIN R 100 UNIT/ML injection Inject 16 Units into the skin 2 (two) times daily before a meal. 06/13/18   [provider]  HYDROcodone-acetaminophen (NORCO/VICODIN) 5-325 MG tablet Take 1 tablet by mouth every 6 (six) hours as needed. 10/08/20   Gwyneth Sprout, MD  insulin NPH-regular Human (70-30) 100 UNIT/ML injection Inject 40 Units into the skin 2 (two) times daily. 07/01/17   [provider]  isosorbide mononitrate (IMDUR) 60 MG 24 hr tablet Take 1.5 tablets (90 mg total) by mouth daily. 07/15/20   Kroeger, Ovidio Kin., PA-C  lidocaine (LIDODERM) 5 % Place 1 patch onto the skin daily. Remove & Discard patch within 12 hours or as directed by MD 10/08/20   Gwyneth Sprout, MD  loratadine (CLARITIN) 10 MG tablet Take 10 mg by mouth daily.    [provider]  losartan (COZAAR) 50 MG tablet Take 50 mg by mouth daily.    [provider]  megestrol (MEGACE) 40 MG tablet TAKE 2 TABLETS (80 MG TOTAL) BY MOUTH 2 (TWO) TIMES DAILY FOR 14 DAYS. 10/19/20 10/19/21  Henderly, Britni A, PA-C  nitroGLYCERIN (NITROSTAT) 0.4 MG SL tablet Place 1 tablet (0.4 mg total) under the tongue as needed for  chest pain. 07/15/20   Kroeger, Ovidio Kin., PA-C  pantoprazole (PROTONIX) 40 MG tablet Take 40 mg by mouth 2 (two) times daily.     [provider]  polyethylene glycol powder (GLYCOLAX/MIRALAX) powder Take 17 g by mouth daily as needed for moderate constipation.  04/05/18   [provider]  PRESCRIPTION MEDICATION Inhale 1-2 puffs into the lungs as needed (Shortness of Breath / Breathing.). "patient calls it smokers inhaler"    [provider]  QUEtiapine (SEROQUEL) 200 MG tablet Take 200 mg by mouth daily.  04/05/18   [provider]  QUEtiapine (SEROQUEL) 400 MG tablet Take 400 mg by mouth at bedtime. 04/05/18   [provider]  amLODipine (NORVASC) 5 MG tablet Take 1 tablet (5 mg total) by mouth daily. Patient not taking: No sig reported 09/26/20 10/19/20  Beatriz Stallion., PA-C    Allergies    Bee venom, Contrast media [iodinated diagnostic agents], and Tomato  Review of Systems   Review of Systems  Constitutional:       Per HPI, otherwise negative  HENT:       Per HPI, otherwise negative  Respiratory:       Per HPI, otherwise negative  Cardiovascular:       Per HPI, otherwise negative  Gastrointestinal: Negative for vomiting.  Endocrine:       Negative aside from HPI  Genitourinary:       Neg aside from HPI   Musculoskeletal:       Per HPI, otherwise negative  Skin: Negative.   Neurological: Negative for syncope.    Physical Exam Updated Vital Signs BP 122/76   Pulse 81   Temp 97.9 F (36.6 C) (Oral)   Resp 19   Ht 5\' 2"  (1.575 m)   Wt 80.3 kg   SpO2 99%   BMI 32.37 kg/m   Physical Exam Vitals and nursing note reviewed.  Constitutional:      General: She is not in acute distress.    Appearance: She is well-developed.  HENT:     Head: Normocephalic and atraumatic.  Eyes:     Conjunctiva/sclera: Conjunctivae normal.  Cardiovascular:     Rate and Rhythm: Normal rate and regular rhythm.  Pulmonary:  Effort:  Pulmonary effort is normal. No respiratory distress.     Breath sounds: Normal breath sounds. No stridor.  Abdominal:     General: There is no distension.  Skin:    General: Skin is warm and dry.  Neurological:     Mental Status: She is alert and oriented to person, place, and time.     Cranial Nerves: No cranial nerve deficit.     ED Results / Procedures / Treatments   Labs (all labs ordered are listed, but only abnormal results are displayed) Labs Reviewed  BASIC METABOLIC PANEL - Abnormal; Notable for the following components:      Result Value   CO2 19 (*)    Glucose, Bld 170 (*)    Creatinine, Ser 1.25 (*)    GFR, Estimated 53 (*)    All other components within normal limits  CBC - Abnormal; Notable for the following components:   WBC 20.3 (*)    RBC 3.01 (*)    Hemoglobin 7.9 (*)    HCT 27.3 (*)    MCHC 28.9 (*)    RDW 22.9 (*)    nRBC 1.2 (*)    All other components within normal limits  I-STAT BETA HCG BLOOD, ED ([facility], [facility], [facility] ONLY)  TROPONIN I (HIGH SENSITIVITY)  TROPONIN I (HIGH SENSITIVITY)    EKG EKG Interpretation  Date/Time:  Wednesday November 26 2020 12:15:33 EDT Ventricular Rate:  89 PR Interval:  140 QRS Duration: 74 QT Interval:  408 QTC Calculation: 497 R Axis:   -27 Text Interpretation: Sinus rhythm Probable left atrial enlargement Inferior infarct, old Probable anterior infarct, age indeterminate No significant change since last tracing Abnormal ECG Confirmed by Gerhard Munch [phone]) on 11/26/2020 12:27:26 PM   EMS rhythm strip rate 94, sinus, T wave flattening, otherwise unremarkable  Radiology DG Chest 2 View  Result Date: 11/26/2020 CLINICAL DATA:  Chest pain. EXAM: CHEST - 2 VIEW COMPARISON:  10/08/2020. FINDINGS: Mediastinum hilar structures normal. Heart size normal. Right perihilar and bibasilar infiltrates. Bibasilar atelectasis and or scarring again noted. No pleural effusion or pneumothorax. No acute bony abnormality. IMPRESSION: Right  perihilar and bibasilar infiltrates most consistent with pneumonia. Bibasilar atelectasis and or scarring again noted. Electronically Signed   By: Maisie Fus  Register   On: 11/26/2020 13:33    Procedures Procedures   Medications Ordered in ED Medications  azithromycin (ZITHROMAX) tablet 500 mg (has no administration in time range)  amoxicillin-clavulanate (AUGMENTIN) 875-125 MG per tablet 1 tablet (has no administration in time range)    ED Course  I have reviewed the triage vital signs and the nursing notes.  Pertinent labs & imaging results that were available during my care of the patient were reviewed by me and considered in my medical decision making (see chart for details).  Cardiac monitor 90s sinus unremarkable Pulse ox 97% room air normal   2:37 PM On repeat exam the patient is accompanied by female companion.  We discussed all findings, and I have reviewed her x-ray, labs, EKG.  EKG, labs not consistent with atypical ACS, though she does have notable risk profile.  Alternative diagnosis of pneumonia, given the patient's ongoing cough, abnormal x-ray, smoking addiction of pneumonia likely contributing to her chest pain.  Patient requests assistance with medication, and social work has been consulted for this. Without evidence for bacteremia, sepsis, atypical ACS, pneumothorax, patient appropriate for discharge with ongoing outpatient oral antibiotic therapy, smoking cessation assistance.  Final Clinical Impression(s) / ED Diagnoses Final  diagnoses:  Atypical chest pain  Community acquired pneumonia of left lower lobe of lung    Rx / DC Orders ED Discharge Orders         Ordered    azithromycin (ZITHROMAX) 250 MG tablet  Daily        11/26/20 1433    amoxicillin-clavulanate (AUGMENTIN) 875-125 MG tablet  Every 12 hours        11/26/20 1433           Gerhard MunchLockwood, Orvil Faraone, MD 11/26/20 1438

## 2020-11-26 NOTE — ED Triage Notes (Signed)
Per EMS: 0300 chest pressure woke her up. Nitro gave some relief. Took 324 ASA and another Nitro this morning. Pain worsening as day goes on. Received another 324 ASA. Slight SOB/dizzy. EMS gave Nitro, 20g R Hand. HX: MI 2021

## 2020-12-09 ENCOUNTER — Other Ambulatory Visit: Payer: Self-pay

## 2020-12-09 ENCOUNTER — Ambulatory Visit (INDEPENDENT_AMBULATORY_CARE_PROVIDER_SITE_OTHER): Payer: Self-pay | Admitting: Pharmacist

## 2020-12-09 VITALS — BP 114/76 | HR 83 | Resp 15 | Ht 62.0 in | Wt 179.2 lb

## 2020-12-09 DIAGNOSIS — I2109 ST elevation (STEMI) myocardial infarction involving other coronary artery of anterior wall: Secondary | ICD-10-CM

## 2020-12-09 DIAGNOSIS — I639 Cerebral infarction, unspecified: Secondary | ICD-10-CM

## 2020-12-09 DIAGNOSIS — I10 Essential (primary) hypertension: Secondary | ICD-10-CM

## 2020-12-09 NOTE — Patient Instructions (Signed)
It was nice meeting you today!  We would like to keep your blood pressure less than 130/80  Continue your losartan 100 mg and your carvedilol 25 mg twice a day  Eliminate sugary drinks and sweets  Try to cut down on smoking  Laural Golden, PharmD, BCACP, CDCES, CPP [facility] [address], [place], [place] [address] Phone: [phone]; Fax: [phone] 12/09/2020 11:55 AM

## 2020-12-09 NOTE — Progress Notes (Signed)
Patient ID: [name]                 DOB: 12-28-1970                      MRN: [id number]     HPI: Maria Harper is a 50 y.o. female referred by Dr. Herbie Baltimore to HTN clinic. PMH is significant for STEMI, CAD, HTN, CVA, and T2DM.  Patient has a history of substance and tobacco abuse.  Patient presents today with partner in good spirits.  Is not following a cardiac or diabetic friendly diet.  Drinks a lot of sugary beverages such as sweet tea and diet is heavy on processed foods.  Smokes between 10-20 cigarettes a day.  Unclear on medication compliance.  Patient does not have any insurance coverage and [facility] her [facility].  She stopped her Brilinta due to abnormal vaginal bleeding which has since resolved.  Is not checking her blood pressure or blood sugar at home.  Current HTN meds: carvedilol 25mg  BID, isiosorbide 60mg  daily, losartan 100mg  daily  BP goal: <130/80  Social History:  Smoking between 10-20 cigarettes a day   Wt Readings from Last 3 Encounters:  11/26/20 177 lb (80.3 kg)  09/26/20 179 lb 9.6 oz (81.5 kg)  08/27/20 180 lb (81.6 kg)   BP Readings from Last 3 Encounters:  11/26/20 (!) 102/57  10/19/20 93/79  10/08/20 (!) 147/92   Pulse Readings from Last 3 Encounters:  11/26/20 80  10/19/20 81  10/08/20 (!) 52    Renal function: Estimated Creatinine Clearance: 54.6 mL/min (A) (by C-G formula based on SCr of 1.25 mg/dL (H)).  Past Medical History:  Diagnosis Date  . Acid reflux   . Cocaine abuse (HCC)   . Coronary artery disease, occlusive 2017   2017 or 2018-PCI to LAD (mid-distal); -> anterior STEMI April 2021 -> LAD stent patent, however distal LAD at D3 occluded, only able to reduce to 70% stenosis.  Small caliber RPDA with 80% stenosis.  . Diabetes mellitus without complication (HCC)   . Heart attack (HCC) 04/2016  . High cholesterol   . Hypertension   . Ischemic cardiomyopathy 11/2019  . Tobacco abuse      Current Outpatient Medications on File Prior to Visit  Medication Sig Dispense Refill  . acetaminophen (TYLENOL) 500 MG tablet Take 1,000 mg by mouth in the morning, at noon, and at bedtime.    May 2021 amoxicillin-clavulanate (AUGMENTIN) 875-125 MG tablet Take 1 tablet by mouth every 12 (twelve) hours. 14 tablet 0  . aspirin 81 MG chewable tablet Chew 81 mg by mouth daily.    05/2016 atorvastatin (LIPITOR) 80 MG tablet Take 1 tablet (80 mg total) by mouth daily at 6 PM. 90 tablet 3  . buPROPion (WELLBUTRIN SR) 100 MG 12 hr tablet Take 100 mg by mouth 2 (two) times daily.    . carvedilol (COREG) 25 MG tablet Take 1 tablet (25 mg total) by mouth 2 (two) times daily with a meal. 180 tablet 3  . cephALEXin (KEFLEX) 500 MG capsule TAKE 1 CAPSULE (500 MG TOTAL) BY MOUTH 2 (TWO) TIMES DAILY FOR 7 DAYS. 14 capsule 0  . cyclobenzaprine (FLEXERIL) 10 MG tablet Take 10 mg by mouth 3 (three) times daily as needed for muscle spasms.     12/2019 doxepin (SINEQUAN) 50 MG capsule Take 50 mg by mouth in the morning, at noon, and at bedtime.    . empagliflozin (  JARDIANCE) 10 MG TABS tablet Take 1 tablet (10 mg total) by mouth daily before breakfast. 90 tablet 3  . Evolocumab (REPATHA SURECLICK) 140 MG/ML SOAJ Inject 140 mg into the skin every 14 (fourteen) days. (Patient taking differently: Inject 140 mg into the skin every 21 ( twenty-one) days.) 6 mL 4  . ferrous sulfate 325 (65 FE) MG tablet Take 1 tablet (325 mg total) by mouth daily. 30 tablet 0  . ferrous sulfate 325 (65 FE) MG tablet TAKE 1 TABLET (325 MG TOTAL) BY MOUTH DAILY. 30 tablet 0  . gabapentin (NEURONTIN) 300 MG capsule Take 300 mg by mouth 3 (three) times daily.    Marland Kitchen HUMULIN R 100 UNIT/ML injection Inject 16 Units into the skin 2 (two) times daily before a meal.  0  . HYDROcodone-acetaminophen (NORCO/VICODIN) 5-325 MG tablet Take 1 tablet by mouth every 6 (six) hours as needed. 10 tablet 0  . insulin NPH-regular Human (70-30) 100 UNIT/ML injection Inject 40  Units into the skin 2 (two) times daily.    . isosorbide mononitrate (IMDUR) 60 MG 24 hr tablet Take 1.5 tablets (90 mg total) by mouth daily. 135 tablet 3  . lidocaine (LIDODERM) 5 % Place 1 patch onto the skin daily. Remove & Discard patch within 12 hours or as directed by MD 30 patch 0  . loratadine (CLARITIN) 10 MG tablet Take 10 mg by mouth daily.    Marland Kitchen losartan (COZAAR) 50 MG tablet Take 50 mg by mouth daily.    . megestrol (MEGACE) 40 MG tablet TAKE 2 TABLETS (80 MG TOTAL) BY MOUTH 2 (TWO) TIMES DAILY FOR 14 DAYS. 56 tablet 0  . nitroGLYCERIN (NITROSTAT) 0.4 MG SL tablet Place 1 tablet (0.4 mg total) under the tongue as needed for chest pain. 25 tablet 2  . pantoprazole (PROTONIX) 40 MG tablet Take 40 mg by mouth 2 (two) times daily.     . polyethylene glycol powder (GLYCOLAX/MIRALAX) powder Take 17 g by mouth daily as needed for moderate constipation.     Marland Kitchen PRESCRIPTION MEDICATION Inhale 1-2 puffs into the lungs as needed (Shortness of Breath / Breathing.). "patient calls it smokers inhaler"    . QUEtiapine (SEROQUEL) 200 MG tablet Take 200 mg by mouth daily.     . QUEtiapine (SEROQUEL) 400 MG tablet Take 400 mg by mouth at bedtime.    . [DISCONTINUED] amLODipine (NORVASC) 5 MG tablet Take 1 tablet (5 mg total) by mouth daily. (Patient not taking: No sig reported) 90 tablet 3   No current facility-administered medications on file prior to visit.    Allergies  Allergen Reactions  . Bee Venom Anaphylaxis  . Contrast Media [Iodinated Diagnostic Agents] Hives and Itching  . Tomato Itching and Rash     Assessment/Plan:  1. Hypertension - Patient BP today 114/76 which is at goal of <130/80.    Counseled patient in depth today about importance of lifestyle changes.  Recommend she decrease/quit smoking as soon as possible due to CAD and history of CVA and MI.    Using [employer] handout, discussed with patient which foods were high in carbohydrates and salt.  Encouraged  patient to increase vegetables, whole grains, lean proteins, and healthy fats.  Patient voiced understanding.  No medication changes needed at this time.  Has recheck with Dr. Herbie Baltimore in 1 month.  Continue losartan 100mg  daily Continue carvedilol 25mg  BID Continue isosorbide 60mg  daily Recheck with cardiologist in 1 month  , PharmD, BCACP, CDCES, CPP  [facility] [address] [place], [place] [address] Phone: [phone]; Fax: [phone] 12/11/2020 5:24 PM

## 2021-01-26 ENCOUNTER — Encounter: Payer: Self-pay | Admitting: Cardiology

## 2021-01-26 ENCOUNTER — Other Ambulatory Visit: Payer: Self-pay

## 2021-01-26 ENCOUNTER — Ambulatory Visit (INDEPENDENT_AMBULATORY_CARE_PROVIDER_SITE_OTHER): Payer: Self-pay | Admitting: Cardiology

## 2021-01-26 VITALS — BP 108/78 | HR 80 | Ht 66.0 in | Wt 178.2 lb

## 2021-01-26 DIAGNOSIS — Z720 Tobacco use: Secondary | ICD-10-CM

## 2021-01-26 DIAGNOSIS — I2510 Atherosclerotic heart disease of native coronary artery without angina pectoris: Secondary | ICD-10-CM

## 2021-01-26 DIAGNOSIS — I5032 Chronic diastolic (congestive) heart failure: Secondary | ICD-10-CM

## 2021-01-26 DIAGNOSIS — I25119 Atherosclerotic heart disease of native coronary artery with unspecified angina pectoris: Secondary | ICD-10-CM

## 2021-01-26 DIAGNOSIS — I10 Essential (primary) hypertension: Secondary | ICD-10-CM

## 2021-01-26 DIAGNOSIS — E785 Hyperlipidemia, unspecified: Secondary | ICD-10-CM

## 2021-01-26 DIAGNOSIS — I639 Cerebral infarction, unspecified: Secondary | ICD-10-CM

## 2021-01-26 DIAGNOSIS — Z9861 Coronary angioplasty status: Secondary | ICD-10-CM

## 2021-01-26 DIAGNOSIS — E1169 Type 2 diabetes mellitus with other specified complication: Secondary | ICD-10-CM

## 2021-01-26 MED ORDER — CLOPIDOGREL BISULFATE 75 MG PO TABS: 75.0000 mg | ORAL_TABLET | Freq: Every day | ORAL | 3 refills | Status: DC

## 2021-01-26 MED ORDER — AMLODIPINE BESYLATE 2.5 MG PO TABS: 2.5000 mg | ORAL_TABLET | Freq: Every day | ORAL | 3 refills | Status: DC

## 2021-01-26 NOTE — Patient Instructions (Signed)
Medication Instructions:  Continue taking Isosorbide mono ( Imdur 90 mg )  1 and 1/2 tablets  Stop taking Aspirin daily  Start taking Clopidogrel (Plavix) 75 mg one tablet  daily Start Amlodipine 2.5 mg one tablet daily    Take the  above 2  new prescription to pharmacy   *If you need a refill on your cardiac medications before your next appointment, please call your pharmacy*   Lab Work: Lipid- fasting  hepatic If you have labs (blood work) drawn today and your tests are completely normal, you will receive your results only by: MyChart Message (if you have MyChart) OR A paper copy in the mail If you have any lab test that is abnormal or we need to change your treatment, we will call you to review the results.   Testing/Procedures: Not needed    Follow-Up: At [facility], you and your health needs are our priority.  As part of our continuing mission to provide you with exceptional heart care, we have created designated Provider Care Teams.  These Care Teams include your primary Cardiologist (physician) and Advanced Practice Providers (APPs -  Physician Assistants and Nurse Practitioners) who all work together to provide you with the care you need, when you need it.  We recommend signing up for the patient portal called "MyChart".  Sign up information is provided on this After Visit Summary.  MyChart is used to connect with patients for Virtual Visits (Telemedicine).  Patients are able to view lab/test results, encounter notes, upcoming appointments, etc.  Non-urgent messages can be sent to your provider as well.   To learn more about what you can do with MyChart, go to [email or web address].    Your next appointment:   4 month(s)  The format for your next appointment:   In Person  Provider:   You will see one of the following Advanced Practice Providers on your designated Care Team:   Oris Drone NP Joni Reining, DNP, ANP Marjie Skiff PA  Then, Bryan Lemma, MD will plan to see you again in  7 to 8 month(s).   Other Instructions

## 2021-01-26 NOTE — Progress Notes (Signed)
Primary Care Provider: Lavinia Sharps, NP Cardiologist: Bryan Lemma, MD Electrophysiologist: None  Clinic Note: Chief Complaint  Patient presents with   Follow-up    4 months.  Has had several ER visits.   Coronary Artery Disease    Still has off-and-on chest pain, not different than previous evaluation with nonischemic Myoview. Recently treated with for pneumonia when presented with chest pain.     ===================================  ASSESSMENT/PLAN   Problem List Items Addressed This Visit     CAD S/P percutaneous coronary angioplasty (Chronic)    She does have a LAD stent that is patent but has significant downstream disease.  Had been on Brilinta which was supposed to have been reduced to 60 mg maintenance dose.  This was discontinued because of continued vaginal bleeding.  She was started on aspirin 81 mg.  With no recurrent bleeding in the last few weeks, I would like to reinitiate Thienopyridine--DC aspirin, start Plavix 75 mg daily.  Okay to hold Plavix 5-7 days preop for surgery procedures.  Also okay to hold for up to 1 month if necessary for bleeding.       Relevant Medications   isosorbide mononitrate (IMDUR) 60 MG 24 hr tablet   amLODipine (NORVASC) 2.5 MG tablet   Other Relevant Orders   Hepatic function panel (Completed)   Lipid panel (Completed)   Coronary artery disease involving native coronary artery of native heart with angina pectoris (HCC) - Primary (Chronic)    She still has off-and-on chest pain and I really think it is probably not cardiac in nature.  She could still have spasm.  Again reviewing the Myoview and her cath films, I truly believe that the apical defect is consistent with known anatomy.  She may have some apical thickness/LVH which throws off the wall motion evaluation.  Unfortunately, all of her disease is distal and not revascularizable.  Medical management only. During my last visit, my plan was to discontinue her losartan  which has been reduced to 25 mg.  Is not currently listed on her med list although a month ago it was felt that she was taking 100 mg.  I try to confirm this with her and it appears that she has not taken losartan.  Plan:  ADD low-dose amlodipine 2.5 mg daily in lieu of ARB (which appears to have been taken off her list) for potential coronary spasm. Continue current dose of carvedilol Continue Imdur 90 mg. Continue empagliflozin. Since she is no longer taking Brilinta, she will stop aspirin and start on Plavix 75 mg daily. ->  This can be interrupted as necessary for bleeding or for procedures.  With her existing CAD and -> stroke, needs to be on Plavix over aspirin.Molli Knock to hold 5 to 7 days preop for surgery or procedures.        Relevant Medications   isosorbide mononitrate (IMDUR) 60 MG 24 hr tablet   amLODipine (NORVASC) 2.5 MG tablet   Hyperlipidemia associated with type 2 diabetes mellitus (HCC) (Chronic)    She is currently on Repatha and atorvastatin.  Was due for labs to be checked, and were ordered today.  Reviewed above.  Outstanding LDL.  Triglycerides are still elevated which probably goes more along with her diabetes.  She is on empagliflozin along with insulin 70/30 and meal coverage.  Will defer to PCP.       Relevant Orders   Hepatic function panel (Completed)   Lipid panel (Completed)   Essential hypertension (Chronic)  Her blood pressure is pretty well controlled today.  In fact may be a little on the low side.  She supposedly was on losartan as of last month.  Not sure what happened to it.  Since she has not currently on losartan, I will take this opportunity to start low-dose amlodipine 2.5 mg  If blood pressure starts to climb back up again, would probably titrate amlodipine to 5 mg and only restart ARB if pressures are high beyond that.       Relevant Medications   isosorbide mononitrate (IMDUR) 60 MG 24 hr tablet   amLODipine (NORVASC) 2.5 MG tablet    Chronic heart failure with preserved ejection fraction (HFpEF) (HCC) (Chronic)    Thankfully, her ischemic cardiomyopathy symptoms resolved with titration of medications.  Although she suffered an anterior MI, it was mostly apical and her EF has recovered back to baseline 50 to 60%.  She is not actively having heart failure symptoms of PND or orthopnea.  Does not requiring diuretic as she is euvolemic on exam.    She is on carvedilol and empagliflozin.  I purposely have converted from ARB to calcium channel blocker to help treat potential spasm.       Relevant Medications   isosorbide mononitrate (IMDUR) 60 MG 24 hr tablet   amLODipine (NORVASC) 2.5 MG tablet   Tobacco abuse (Chronic)    She does not seem to be all that interested in stopping smoking.  We had another conversation.  I talked about potentially trying to cut back several cigarettes each week or even potentially considering patches etc.  She is potentially starting to think about it.  Smoking cessation instruction/counseling given:  counseled patient on the dangers of tobacco use, advised patient to stop smoking, and reviewed strategies to maximize success -- 4 min        Acute CVA (cerebrovascular accident) (HCC) (Chronic)    Per neurology, she had been on aspirin and Brilinta.  Brilinta has been stopped because of bleeding. With combination of CAD and prior stroke, we will convert from aspirin to Plavix as monotherapy.       Relevant Medications   isosorbide mononitrate (IMDUR) 60 MG 24 hr tablet   amLODipine (NORVASC) 2.5 MG tablet    ===================================  HPI:    Maria Harper is a 50 y.o. female with a PMH notable for CAD,  Resolved ICM with CHRONIC HFpEF, CVA, HTN, HLD and DM-2 & history of cocaine and tobacco abuse, who presents today for 71-month and hospital follow-up.  Cardiac History CAD:  2018 ]: PCI/DES LAD in [facility] [place]) Anterior STEMI 11/2019:  mid-distal LAD patent, but  distal LAD 100% @ D3 -> PTCA reduced LAD to ~70%, but unable to cross lesion beyond 100% D3.  Tapering rPDA with distal 80% (too small for PCI).  Severely Elevated LVEDP.  -> no revascularization options. Myoview 08/2020 - poor quality, but suggest large apical infarct c/w known anatomy - however Echo w/ no RWMA contradicts this - ? B/c underlying apical LVH.  Chronic HFpEF: Resolved ICM => post MI EF 45 to 50%.  GR 1 DD. => EF 45% up to 55-60% with Mod LVH. Gr1 DD.  On empagliflozin, carvedilol with no diuretic requirement. HTN, HLD, DM type 2,  Cocaine Abuse, Tobacco Abuse,  Recent CVA 03/2020: Left-sided thalamic stroke: MRA showed 70% right supraclinoid ICA and high-grade narrowing of left petrous cavernous junction.  A1c was 9.1, LDL 90.  Neurology recommended continued DAPT.  Edrick Oh  was last seen on September 26, 2020 -stable off-and-on chest pain.  Not necessarily worse with exertion.  Was taking NTG maybe once or twice a week. Myoview results reviewed.  No obvious ischemia.   Minimal residual symptoms from stroke. CP potentially spasm --> Imdur 90 mg, ARB d/c'd & Amlodipine 5 mg added, Reduced Brilinta to 60 mg, Repatha Rx refilled along with atorvastatin.  Unfortunately, the a amlodipine prescription did not go through or was not refilled.  She is therefore not on ARB or calcium channel blocker.  Recent Hospitalizations:  10/08/2020 [facility], ER: Right (RUQ)-sided abdominal pain just below the ribs.  Worse with movement but also present at rest. ->  CT scan was relatively normal.  UA normal.  Pain felt to be MSK related. 10/19/2020 [facility], ER: Noting vaginal bleeding and hematuria. ->  Treated for UTI We were contacted about her bleeding - OK to hold Brilinta x 7 days.  With recurrent bleeding, was told to stop Brilinta indefinitely until bleeding stops. 11/26/2020-ER visit for atypical chest pain-> CP x ~ 8 hrs that awoke her from sleep. Persistent, sharp but pressure-like.   R/o for MI / ACS with negative Troponin.   CXR suggested CAP -- d/c on Z-pack & Augmentin  Was seen by Laural Golden, Pharm.D. in CVRR HTN/LIPID clinic  on 5/10 --> was on Carvedilol 25 mg BID, Imdur 90 mg (although his note indicated 60 mg), Losartan 50 mg (although his note indicated 100 mg) -> BP was well controlled.   Smoking Cessation counseling provided.    Reviewed  CV studies:    The following studies were reviewed today: (if available, images/films reviewed: From Epic Chart or Care Everywhere) No new studies  Interval History:   Maria Harper returns here today for follow-up of stating that the coughing and shortness of breath as well as chest discomfort has improved since starting antibiotics.  She notes that she gets short of breath sometimes when she is drinking or eating.  This usually leads to coughing -> suggests possible aspiration.  She has not any further bleeding issues, but is not on antiplatelet agent other than aspirin.  She still has off-and-on mild chest discomfort which is not necessarily associated with activity.  Can happen at rest, at sleep, or with mild exertion.  No real PND, orthopnea or edema.  No symptoms of arrhythmia.  CV Review of Symptoms (Summary) Cardiovascular ROS: positive for - chest pain, dyspnea on exertion, shortness of breath, and chest pain is atypical, happens regardless of activity.  No exacerbation with activity.  Exertional dyspnea stable if not improved. negative for - edema, irregular heartbeat, orthopnea, palpitations, paroxysmal nocturnal dyspnea, rapid heart rate, or other than the getting lightheaded with coughing, no syncope or near syncope, no recurrent CVA/TIA or amaurosis fugax symptoms, no claudication  REVIEWED OF SYSTEMS   Review of Systems  Constitutional:  Positive for malaise/fatigue. Negative for chills, fever and weight loss.  HENT:  Negative for congestion and nosebleeds.   Respiratory:  Positive for cough and  shortness of breath.        Has improved with antibiotics.  Gastrointestinal:  Negative for blood in stool and melena.  Genitourinary:  Negative for hematuria (None in the last few weeks).       No recurrent vaginal bleeding  Musculoskeletal:  Positive for back pain. Negative for falls, joint pain and myalgias.  Neurological:  Positive for dizziness (With poor balance.), focal weakness (Still has some residual right-handed weakness from  her stroke) and weakness (Generalized).  Psychiatric/Behavioral:  Positive for memory loss. Negative for depression. The patient is nervous/anxious. The patient does not have insomnia.    I have reviewed and (if needed) personally updated the patient's problem list, medications, allergies, past medical and surgical history, social and family history.   PAST MEDICAL HISTORY   Past Medical History:  Diagnosis Date   Acid reflux    Cocaine abuse (HCC)    Coronary artery disease, occlusive 2017   2017 or 2018-PCI to LAD (mid-distal); -> anterior STEMI April 2021 -> LAD stent patent, however distal LAD at D3 occluded, only able to reduce to 70% stenosis.  Small caliber RPDA with 80% stenosis.   Diabetes mellitus without complication (HCC)    Heart attack (HCC) 04/2016   High cholesterol    Hypertension    Ischemic cardiomyopathy 11/2019   Tobacco abuse     PAST SURGICAL HISTORY   Past Surgical History:  Procedure Laterality Date   CORONARY/GRAFT ACUTE MI REVASCULARIZATION N/A 11/23/2019   Procedure: CORONARY/GRAFT ACUTE MI REVASCULARIZATION;  Surgeon: [name], MD;  Location: [facility] INVASIVE CV LAB;  Service: Cardiovascular; unsuccessful PTCA of distal LAD 100%.  O able to restore only minimal flow with 70% stenosis   LEFT HEART CATH AND CORONARY ANGIOGRAPHY N/A 11/23/2019   Procedure: LEFT HEART CATH AND CORONARY ANGIOGRAPHY;  Surgeon: [name], MD;  Location: [facility] INVASIVE CV LAB;  Service: Cardiovascular; ANTERIOR STEMI: Widely patent mid to  distal LAD stent, distal LAD is 100% stenosed associated with the third diagonal.  (Post PTCA - 70% residual but 100% D3).  RPDA tapers to a small vessel -80% stenosis.  Severely elevated LVEDP   NM MYOVIEW LTD  08/27/2020   (recurrent chest pain): Poor study due to gut attenuation.  EF ~50-55%.  No EKG changes.  MLSTLY FIXED LARGE/SEVRE perfusion defect in the basal-apical inferior, mid inferolateral & mid-apical anterior / apical walls: Findings are c/w known anatomy (apical LAD occlusion & PDA 80%, with gut attenuation enhancing inferior involvement -> but Echo with normal EF & no RWA - contradicts this finding.   TRANSTHORACIC ECHOCARDIOGRAM  11/24/2019   -post anterior STEMI: EF mildly reduced 45 to 50%.  GR 1 DD.  Elevated LAP.  No obvious heart WMA.  Moderate-severe AI with mild-moderate Aortic Sclerosis but no Stenosis.   TRANSTHORACIC ECHOCARDIOGRAM  03/23/2020   (stroke evaluation): EF improved to 55 to 60%.  Significant LVH-notable apical.  Consider cardiac MRI.  Moderate concentric LVH.  GR 1 DD.  Elevated LAP.  Mild to moderate Aortic Sclerosis but no Stenosis.  Moderate AI.    April 2021: Anterior STEMI - 100% distal LAD @ D3 - unable to cross safely beyond D3 - PTCA only to a short portion of LAD - reduced to 70%, but diffusely diseased 99% distal LAD & 100% D3, 80 % small rPDA.  Myoview 08/27/2020 (recurrent chest pain): EF 50-55%. Large size severe defect in the basal inferior, mid anterior mid inferior, mid inferolateral, apical anterior, apical inferior and apical location either related to artifact due to get uptake versus large infarct. RWMA not seen on Echo? Regardless - would be c/w large apical infarct with minimal per-infarct ischemia - c/w known anatomy.   There is no immunization history on file for this patient.  MEDICATIONS/ALLERGIES   Current Meds  Medication Sig   acetaminophen (TYLENOL) 500 MG tablet Take 1,000 mg by mouth in the morning, at noon, and at bedtime.  atorvastatin (LIPITOR) 80 MG tablet Take 1 tablet (80 mg total) by mouth daily at 6 PM.   carvedilol (COREG) 25 MG tablet Take 1 tablet (25 mg total) by mouth 2 (two) times daily with a meal.   cyclobenzaprine (FLEXERIL) 10 MG tablet Take 10 mg by mouth 3 (three) times daily as needed for muscle spasms.    doxepin (SINEQUAN) 50 MG capsule Take 50 mg by mouth in the morning, at noon, and at bedtime.   empagliflozin (JARDIANCE) 10 MG TABS tablet Take 1 tablet (10 mg total) by mouth daily before breakfast.   Evolocumab (REPATHA SURECLICK) 140 MG/ML SOAJ Inject 140 mg into the skin every 14 (fourteen) days. (Patient taking differently: Inject 140 mg into the skin every 21 ( twenty-one) days.)   gabapentin (NEURONTIN) 300 MG capsule Take 300 mg by mouth 3 (three) times daily.   HUMULIN R 100 UNIT/ML injection Inject 16 Units into the skin 2 (two) times daily before a meal.   insulin NPH-regular Human (70-30) 100 UNIT/ML injection Inject 40 Units into the skin 2 (two) times daily.   ipratropium (ATROVENT HFA) 17 MCG/ACT inhaler Inhale 2 puffs into the lungs every 6 (six) hours.   isosorbide mononitrate (IMDUR) 60 MG 24 hr tablet Take 1 and 1/2 tablet ( total 90 mg)  by mouth daily .   nitroGLYCERIN (NITROSTAT) 0.4 MG SL tablet Place 1 tablet (0.4 mg total) under the tongue as needed for chest pain.   pantoprazole (PROTONIX) 40 MG tablet Take 40 mg by mouth 2 (two) times daily.    polyethylene glycol powder (GLYCOLAX/MIRALAX) powder Take 17 g by mouth daily as needed for moderate constipation.    QUEtiapine (SEROQUEL) 400 MG tablet Take 400 mg by mouth at bedtime.   aspirin 81 MG chewable tablet Chew 81 mg by mouth daily.    Allergies  Allergen Reactions   Bee Venom Anaphylaxis   Contrast Media [Iodinated Diagnostic Agents] Hives and Itching   Tomato Itching and Rash    SOCIAL HISTORY/FAMILY HISTORY   Reviewed in Epic:  Pertinent findings:  Social History   Tobacco Use   Smoking status:  Every Day    Packs/day: 1.50    Years: 20.00    Pack years: 30.00    Types: Cigarettes   Smokeless tobacco: Never  Vaping Use   Vaping Use: Never used  Substance Use Topics   Alcohol use: Not Currently   Drug use: Yes    Types: "Crack" cocaine    Comment: last used yesterday. States " I do it everyday"    Social History   Social History Narrative   Not on file    OBJCTIVE -PE, EKG, labs   Wt Readings from Last 3 Encounters:  01/26/21 178 lb 3.2 oz (80.8 kg)  12/09/20 179 lb 3.2 oz (81.3 kg)  11/26/20 177 lb (80.3 kg)    Physical Exam: BP 108/78 (BP Location: Right Arm, Patient Position: Sitting, Cuff Size: Normal)   Pulse 80   Ht 5\' 6"  (1.676 m)   Wt 178 lb 3.2 oz (80.8 kg)   SpO2 99%   BMI 28.76 kg/m  Physical Exam Vitals reviewed.  Constitutional:      General: She is not in acute distress.    Appearance: Normal appearance. She is normal weight. She is not toxic-appearing.  HENT:     Head: Normocephalic and atraumatic.  Neck:     Vascular: No carotid bruit (Radiated aortic murmur) or JVD.  Cardiovascular:  Rate and Rhythm: Normal rate and regular rhythm. Occasional Extrasystoles are present.    Chest Wall: PMI is not displaced.     Pulses: Normal pulses and intact distal pulses.     Heart sounds: S1 normal and S2 normal. Heart sounds are distant. Murmur heard.  Harsh crescendo-decrescendo early systolic murmur is present with a grade of 2/6 at the upper right sternal border radiating to the neck.  Low-pitched blowing early diastolic murmur is present with a grade of 1/4 at the upper left sternal border.    No friction rub. No gallop. No S4 sounds.  Pulmonary:     Effort: Pulmonary effort is normal. No respiratory distress.     Breath sounds: Normal breath sounds. No wheezing, rhonchi or rales.  Chest:     Chest wall: No tenderness.  Musculoskeletal:        General: No swelling (Trivial).     Cervical back: Normal range of motion and neck supple.   Neurological:     Mental Status: She is alert and oriented to person, place, and time. Mental status is at baseline.  Psychiatric:        Mood and Affect: Mood normal.        Behavior: Behavior normal.        Thought Content: Thought content normal.        Judgment: Judgment normal.     Adult ECG Report Not checked  Recent Labs:  Checked today  Lab Results  Component Value Date   CHOL 108 01/26/2021   HDL 43 01/26/2021   LDLCALC 28 01/26/2021   TRIG 241 (H) 01/26/2021   CHOLHDL 2.5 01/26/2021   Lab Results  Component Value Date   CREATININE 1.25 (H) 11/26/2020   BUN 11 11/26/2020   NA 136 11/26/2020   K 3.5 11/26/2020   CL 104 11/26/2020   CO2 19 (L) 11/26/2020   CBC Latest Ref Rng & Units 11/26/2020 10/19/2020 10/08/2020  WBC 4.0 - 10.5 K/uL 20.3(H) 8.5 6.3  Hemoglobin 12.0 - 15.0 g/dL 7.9(L) 8.5(L) 11.5(L)  Hematocrit 36.0 - 46.0 % 27.3(L) 26.7(L) 35.8(L)  Platelets 150 - 400 K/uL 287 335 198    No results found for: TSH  ==================================================  COVID-19 Education: The signs and symptoms of COVID-19 were discussed with the patient and how to seek care for testing (follow up with PCP or arrange E-visit).    I spent a total of 34 minutes with the patient spent in direct patient consultation.  Additional time spent with chart review  / charting (studies, outside notes, etc): 30 She has had several ER visits and clinic visits that were reviewed.  I went back and reviewed her Myoview again in comparison with cath films.  Also had to try to ascertain what happened to her medications including Brilinta and losartan/amlodipine. Total Time: 64 min  Current medicines are reviewed at length with the patient today.  (+/- concerns) n/a  This visit occurred during the SARS-CoV-2 public health emergency.  Safety protocols were in place, including screening questions prior to the visit, additional usage of staff PPE, and extensive cleaning of exam room  while observing appropriate contact time as indicated for disinfecting solutions.  Notice: This dictation was prepared with Dragon dictation along with smaller phrase technology. Any transcriptional errors that result from this process are unintentional and may not be corrected upon review.  Patient Instructions / Medication Changes & Studies & Tests Ordered   Patient Instructions  Medication Instructions:  Continue taking  Isosorbide mono ( Imdur 90 mg )  1 and 1/2 tablets  Stop taking Aspirin daily  Start taking Clopidogrel (Plavix) 75 mg one tablet  daily Start Amlodipine 2.5 mg one tablet daily    Take the  above 2  new prescription to pharmacy   *If you need a refill on your cardiac medications before your next appointment, please call your pharmacy*   Lab Work: Lipid- fasting  hepatic If you have labs (blood work) drawn today and your tests are completely normal, you will receive your results only by: MyChart Message (if you have MyChart) OR A paper copy in the mail If you have any lab test that is abnormal or we need to change your treatment, we will call you to review the results.   Testing/Procedures: Not needed    Follow-Up: At [facility], you and your health needs are our priority.  As part of our continuing mission to provide you with exceptional heart care, we have created designated Provider Care Teams.  These Care Teams include your primary Cardiologist (physician) and Advanced Practice Providers (APPs -  Physician Assistants and Nurse Practitioners) who all work together to provide you with the care you need, when you need it.  We recommend signing up for the patient portal called "MyChart".  Sign up information is provided on this After Visit Summary.  MyChart is used to connect with patients for Virtual Visits (Telemedicine).  Patients are able to view lab/test results, encounter notes, upcoming appointments, etc.  Non-urgent messages can be sent to your  provider as well.   To learn more about what you can do with MyChart, go to [email or web address].    Your next appointment:   4 month(s)  The format for your next appointment:   In Person  Provider:   You will see one of the following Advanced Practice Providers on your designated Care Team:   Oris Drone NP Joni Reining, DNP, ANP Marjie Skiff PA  Then, Bryan Lemma, MD will plan to see you again in  7 to 8 month(s).   Other Instructions     Studies Ordered:   Orders Placed This Encounter  Procedures   Hepatic function panel   Lipid panel     Bryan Lemma, M.D., M.S. Interventional Cardiologist   Pager # [phone] Phone # [phone] [address] [place], [place] [address]   Thank you for choosing [facility] at [facility]!!

## 2021-01-27 LAB — LIPID PANEL
Chol/HDL Ratio: 2.5 ratio (ref 0.0–4.4)
Cholesterol, Total: 108 mg/dL (ref 100–199)
HDL: 43 mg/dL (ref >39)
LDL Chol Calc (NIH): 28 mg/dL (ref 0–99)
Triglycerides: 241 mg/dL — ABNORMAL HIGH (ref 0–149)
VLDL Cholesterol Cal: 37 mg/dL (ref 5–40)

## 2021-01-27 LAB — HEPATIC FUNCTION PANEL
ALT: 11 IU/L (ref 0–32)
AST: 13 IU/L (ref 0–40)
Albumin: 4.6 g/dL (ref 3.8–4.8)
Alkaline Phosphatase: 81 IU/L (ref 44–121)
Bilirubin Total: <0.2 mg/dL (ref 0.0–1.2)
Bilirubin, Direct: <0.1 mg/dL (ref 0.00–0.40)
Total Protein: 7.6 g/dL (ref 6.0–8.5)

## 2021-02-01 ENCOUNTER — Encounter: Payer: Self-pay | Admitting: Cardiology

## 2021-02-01 DIAGNOSIS — I5032 Chronic diastolic (congestive) heart failure: Secondary | ICD-10-CM | POA: Insufficient documentation

## 2021-02-01 NOTE — Assessment & Plan Note (Signed)
Her blood pressure is pretty well controlled today.  In fact may be a little on the low side.  She supposedly was on losartan as of last month.  Not sure what happened to it.  Since she has not currently on losartan, I will take this opportunity to start low-dose amlodipine 2.5 mg  If blood pressure starts to climb back up again, would probably titrate amlodipine to 5 mg and only restart ARB if pressures are high beyond that.

## 2021-02-01 NOTE — Assessment & Plan Note (Signed)
She does have a LAD stent that is patent but has significant downstream disease.  Had been on Brilinta which was supposed to have been reduced to 60 mg maintenance dose.  This was discontinued because of continued vaginal bleeding.  She was started on aspirin 81 mg.  With no recurrent bleeding in the last few weeks, I would like to reinitiate Thienopyridine--DC aspirin, start Plavix 75 mg daily.   Okay to hold Plavix 5-7 days preop for surgery procedures.  Also okay to hold for up to 1 month if necessary for bleeding.

## 2021-02-01 NOTE — Assessment & Plan Note (Signed)
Per neurology, she had been on aspirin and Brilinta.  Brilinta has been stopped because of bleeding. With combination of CAD and prior stroke, we will convert from aspirin to Plavix as monotherapy.

## 2021-02-01 NOTE — Assessment & Plan Note (Signed)
She still has off-and-on chest pain and I really think it is probably not cardiac in nature.  She could still have spasm.  Again reviewing the Myoview and her cath films, I truly believe that the apical defect is consistent with known anatomy.  She may have some apical thickness/LVH which throws off the wall motion evaluation.  Unfortunately, all of her disease is distal and not revascularizable.  Medical management only. During my last visit, my plan was to discontinue her losartan which has been reduced to 25 mg.  Is not currently listed on her med list although a month ago it was felt that she was taking 100 mg.  I try to confirm this with her and it appears that she has not taken losartan.  Plan:   ADD low-dose amlodipine 2.5 mg daily in lieu of ARB (which appears to have been taken off her list) for potential coronary spasm.  Continue current dose of carvedilol  Continue Imdur 90 mg.  Continue empagliflozin.  Since she is no longer taking Brilinta, she will stop aspirin and start on Plavix 75 mg daily. ->  This can be interrupted as necessary for bleeding or for procedures.  With her existing CAD and -> stroke, needs to be on Plavix over aspirin.Molli Knock to hold 5 to 7 days preop for surgery or procedures.

## 2021-02-01 NOTE — Assessment & Plan Note (Signed)
She does not seem to be all that interested in stopping smoking.  We had another conversation.  I talked about potentially trying to cut back several cigarettes each week or even potentially considering patches etc.  She is potentially starting to think about it.  Smoking cessation instruction/counseling given:  counseled patient on the dangers of tobacco use, advised patient to stop smoking, and reviewed strategies to maximize success -- 4 min

## 2021-02-01 NOTE — Assessment & Plan Note (Signed)
Thankfully, her ischemic cardiomyopathy symptoms resolved with titration of medications.  Although she suffered an anterior MI, it was mostly apical and her EF has recovered back to baseline 50 to 60%.  She is not actively having heart failure symptoms of PND or orthopnea.  Does not requiring diuretic as she is euvolemic on exam.    She is on carvedilol and empagliflozin.  I purposely have converted from ARB to calcium channel blocker to help treat potential spasm.

## 2021-02-01 NOTE — Assessment & Plan Note (Signed)
She is currently on Repatha and atorvastatin.  Was due for labs to be checked, and were ordered today.  Reviewed above.  Outstanding LDL.  Triglycerides are still elevated which probably goes more along with her diabetes.  She is on empagliflozin along with insulin 70/30 and meal coverage.  Will defer to PCP.

## 2021-02-03 ENCOUNTER — Telehealth: Payer: Self-pay | Admitting: *Deleted

## 2021-02-03 NOTE — Telephone Encounter (Signed)
-----   Message from [name], MD sent at 01/29/2021  3:02 AM EDT ----- Lab results:  Normal liver function  Cholesterol levels look great.  Total cholesterol down to 108 with an LDL of 28.  Triglycerides are still high at 241 which goes along with diabetes.  I really think we can probably reduce the atorvastatin dose to 1/2 tablet (40 mg).  Bryan Lemma, MD

## 2021-02-03 NOTE — Telephone Encounter (Signed)
Called patient 's number - no answer - phone rang x 6 . Will try again

## 2021-02-05 MED ORDER — ATORVASTATIN CALCIUM 40 MG PO TABS: 40.0000 mg | ORAL_TABLET | Freq: Every day | ORAL | 3 refills | Status: DC

## 2021-02-05 NOTE — Telephone Encounter (Signed)
The patient's husband  has been notified of the result  LIPID , hepatic panel and verbalized understanding.  All questions (if any) were answered.  Patient and husband would like RX sent to [facility] Tobin Chad, RN 02/05/2021 3:04 PM

## 2021-02-12 ENCOUNTER — Telehealth: Payer: Self-pay | Admitting: Cardiology

## 2021-02-12 NOTE — Telephone Encounter (Incomplete)
*  STAT* If patient is at the pharmacy, call can be transferred to refill team.   1. Which medications need to be refilled? (please list name of each medication and dose if known) Evolocumab (REPATHA SURECLICK) 140 MG/ML SOAJ  2. Which pharmacy/location (including street and city if local pharmacy) is medication to be sent to? Patient assistant  3. Do they need a 30 day or 90 day supply? 90

## 2021-02-24 ENCOUNTER — Telehealth: Payer: Self-pay | Admitting: Cardiology

## 2021-02-24 NOTE — Telephone Encounter (Signed)
She does have plenty of reason to have chest pain.  I would like for her to increase amlodipine to 5 mg from 2.5 mg daily. I would like her to reduce the dose of carvedilol but corresponds with when she takes amlodipine to 1/2 tablet.  (12.5 mg).  With see if this keeps on the chest pain from returning.  Bryan Lemma, MD

## 2021-02-24 NOTE — Telephone Encounter (Signed)
    Pt c/o of Chest Pain: STAT if CP now or developed within 24 hours  1. Are you having CP right now? No  2. Are you experiencing any other symptoms (ex. SOB, nausea, vomiting, sweating)? SOB  3. How long have you been experiencing CP? yesterday  4. Is your CP continuous or coming and going? Coming and going   5. Have you taken Nitroglycerin? Yes took 6    Pt took total of 6 nitroglycerine since yesterday for CP

## 2021-02-24 NOTE — Telephone Encounter (Signed)
Pt was calling because his PCP told him to call us to "let us know" that pt has been having yesterday and today. Yesterday he took nitro the in 5 minutes, the CP was not relieved so another was taken then still wasn't relieved so she took the third and this made the pain resolved. Informed pt to go to the ER if this happens again.

## 2021-02-25 NOTE — Telephone Encounter (Signed)
Called mobile phone - unable to leave a message on mobile.  Left message on home to call back

## 2021-02-26 NOTE — Telephone Encounter (Signed)
Left message to call back  

## 2021-03-02 NOTE — Telephone Encounter (Signed)
Tried to call cell number "your call cannot be completed as dialed" will have to try again later

## 2021-03-08 ENCOUNTER — Ambulatory Visit (HOSPITAL_BASED_OUTPATIENT_CLINIC_OR_DEPARTMENT_OTHER): Payer: Medicaid Other | Admitting: Internal Medicine

## 2021-03-11 ENCOUNTER — Other Ambulatory Visit: Payer: Self-pay

## 2021-03-11 ENCOUNTER — Ambulatory Visit (HOSPITAL_BASED_OUTPATIENT_CLINIC_OR_DEPARTMENT_OTHER): Payer: Medicaid Other | Attending: *Deleted | Admitting: Internal Medicine

## 2021-03-11 VITALS — Ht 62.0 in | Wt 173.0 lb

## 2021-03-11 DIAGNOSIS — R5383 Other fatigue: Secondary | ICD-10-CM | POA: Insufficient documentation

## 2021-03-11 DIAGNOSIS — R0681 Apnea, not elsewhere classified: Secondary | ICD-10-CM | POA: Insufficient documentation

## 2021-03-12 ENCOUNTER — Telehealth: Payer: Self-pay | Admitting: Cardiology

## 2021-03-12 ENCOUNTER — Emergency Department (HOSPITAL_COMMUNITY)
Admission: EM | Admit: 2021-03-12 | Discharge: 2021-03-12 | Disposition: A | Discharge status: Home / Self Care | Payer: Medicaid Other | Attending: Emergency Medicine | Admitting: Emergency Medicine

## 2021-03-12 ENCOUNTER — Emergency Department (HOSPITAL_COMMUNITY): Payer: Medicaid Other

## 2021-03-12 ENCOUNTER — Encounter (HOSPITAL_COMMUNITY): Payer: Self-pay | Admitting: Emergency Medicine

## 2021-03-12 ENCOUNTER — Other Ambulatory Visit: Payer: Self-pay

## 2021-03-12 DIAGNOSIS — Z794 Long term (current) use of insulin: Secondary | ICD-10-CM | POA: Insufficient documentation

## 2021-03-12 DIAGNOSIS — Z7902 Long term (current) use of antithrombotics/antiplatelets: Secondary | ICD-10-CM | POA: Diagnosis not present

## 2021-03-12 DIAGNOSIS — E119 Type 2 diabetes mellitus without complications: Secondary | ICD-10-CM | POA: Diagnosis not present

## 2021-03-12 DIAGNOSIS — F17210 Nicotine dependence, cigarettes, uncomplicated: Secondary | ICD-10-CM | POA: Insufficient documentation

## 2021-03-12 DIAGNOSIS — I2510 Atherosclerotic heart disease of native coronary artery without angina pectoris: Secondary | ICD-10-CM | POA: Insufficient documentation

## 2021-03-12 DIAGNOSIS — Z79899 Other long term (current) drug therapy: Secondary | ICD-10-CM | POA: Diagnosis not present

## 2021-03-12 DIAGNOSIS — Z955 Presence of coronary angioplasty implant and graft: Secondary | ICD-10-CM | POA: Insufficient documentation

## 2021-03-12 DIAGNOSIS — I509 Heart failure, unspecified: Secondary | ICD-10-CM | POA: Diagnosis not present

## 2021-03-12 DIAGNOSIS — Z720 Tobacco use: Secondary | ICD-10-CM

## 2021-03-12 DIAGNOSIS — Z7984 Long term (current) use of oral hypoglycemic drugs: Secondary | ICD-10-CM | POA: Diagnosis not present

## 2021-03-12 DIAGNOSIS — E1165 Type 2 diabetes mellitus with hyperglycemia: Secondary | ICD-10-CM | POA: Insufficient documentation

## 2021-03-12 DIAGNOSIS — I110 Hypertensive heart disease with heart failure: Secondary | ICD-10-CM | POA: Insufficient documentation

## 2021-03-12 DIAGNOSIS — R079 Chest pain, unspecified: Secondary | ICD-10-CM | POA: Diagnosis present

## 2021-03-12 DIAGNOSIS — Z9861 Coronary angioplasty status: Secondary | ICD-10-CM

## 2021-03-12 LAB — CBC
HCT: 43.6 % (ref 36.0–46.0)
Hemoglobin: 14.8 g/dL (ref 12.0–15.0)
MCH: 32.1 pg (ref 26.0–34.0)
MCHC: 33.9 g/dL (ref 30.0–36.0)
MCV: 94.6 fL (ref 80.0–100.0)
Platelets: 175 10*3/uL (ref 150–400)
RBC: 4.61 MIL/uL (ref 3.87–5.11)
RDW: 15.2 % (ref 11.5–15.5)
WBC: 5.7 10*3/uL (ref 4.0–10.5)
nRBC: 0 % (ref 0.0–0.2)

## 2021-03-12 LAB — HEPATIC FUNCTION PANEL
ALT: 13 U/L (ref 0–44)
AST: 18 U/L (ref 15–41)
Albumin: 4.3 g/dL (ref 3.5–5.0)
Alkaline Phosphatase: 71 U/L (ref 38–126)
Bilirubin, Direct: 0.2 mg/dL (ref 0.0–0.2)
Indirect Bilirubin: 0.3 mg/dL (ref 0.3–0.9)
Total Bilirubin: 0.5 mg/dL (ref 0.3–1.2)
Total Protein: 8.5 g/dL — ABNORMAL HIGH (ref 6.5–8.1)

## 2021-03-12 LAB — BASIC METABOLIC PANEL
Anion gap: 11 (ref 5–15)
BUN: 22 mg/dL — ABNORMAL HIGH (ref 6–20)
CO2: 24 mmol/L (ref 22–32)
Calcium: 9.6 mg/dL (ref 8.9–10.3)
Chloride: 98 mmol/L (ref 98–111)
Creatinine, Ser: 1.21 mg/dL — ABNORMAL HIGH (ref 0.44–1.00)
GFR, Estimated: 55 mL/min — ABNORMAL LOW (ref >60)
Glucose, Bld: 360 mg/dL — ABNORMAL HIGH (ref 70–99)
Potassium: 4.4 mmol/L (ref 3.5–5.1)
Sodium: 133 mmol/L — ABNORMAL LOW (ref 135–145)

## 2021-03-12 LAB — TROPONIN I (HIGH SENSITIVITY)
Troponin I (High Sensitivity): 4 ng/L (ref <18)
Troponin I (High Sensitivity): 4 ng/L (ref <18)

## 2021-03-12 LAB — D-DIMER, QUANTITATIVE: D-Dimer, Quant: <0.27 ug/mL-FEU (ref 0.00–0.50)

## 2021-03-12 LAB — I-STAT BETA HCG BLOOD, ED (MC, WL, AP ONLY): I-stat hCG, quantitative: <5 m[IU]/mL (ref <5)

## 2021-03-12 IMAGING — CR DG CHEST 2V
2 series · 2 of 2 positions shown · non-contrast
Comparison: [DATE]

CLINICAL DATA: Right mid chest pain and shortness of breath
worsening over the last 2 weeks.

EXAM:
CHEST - 2 VIEW

[w chest pa]
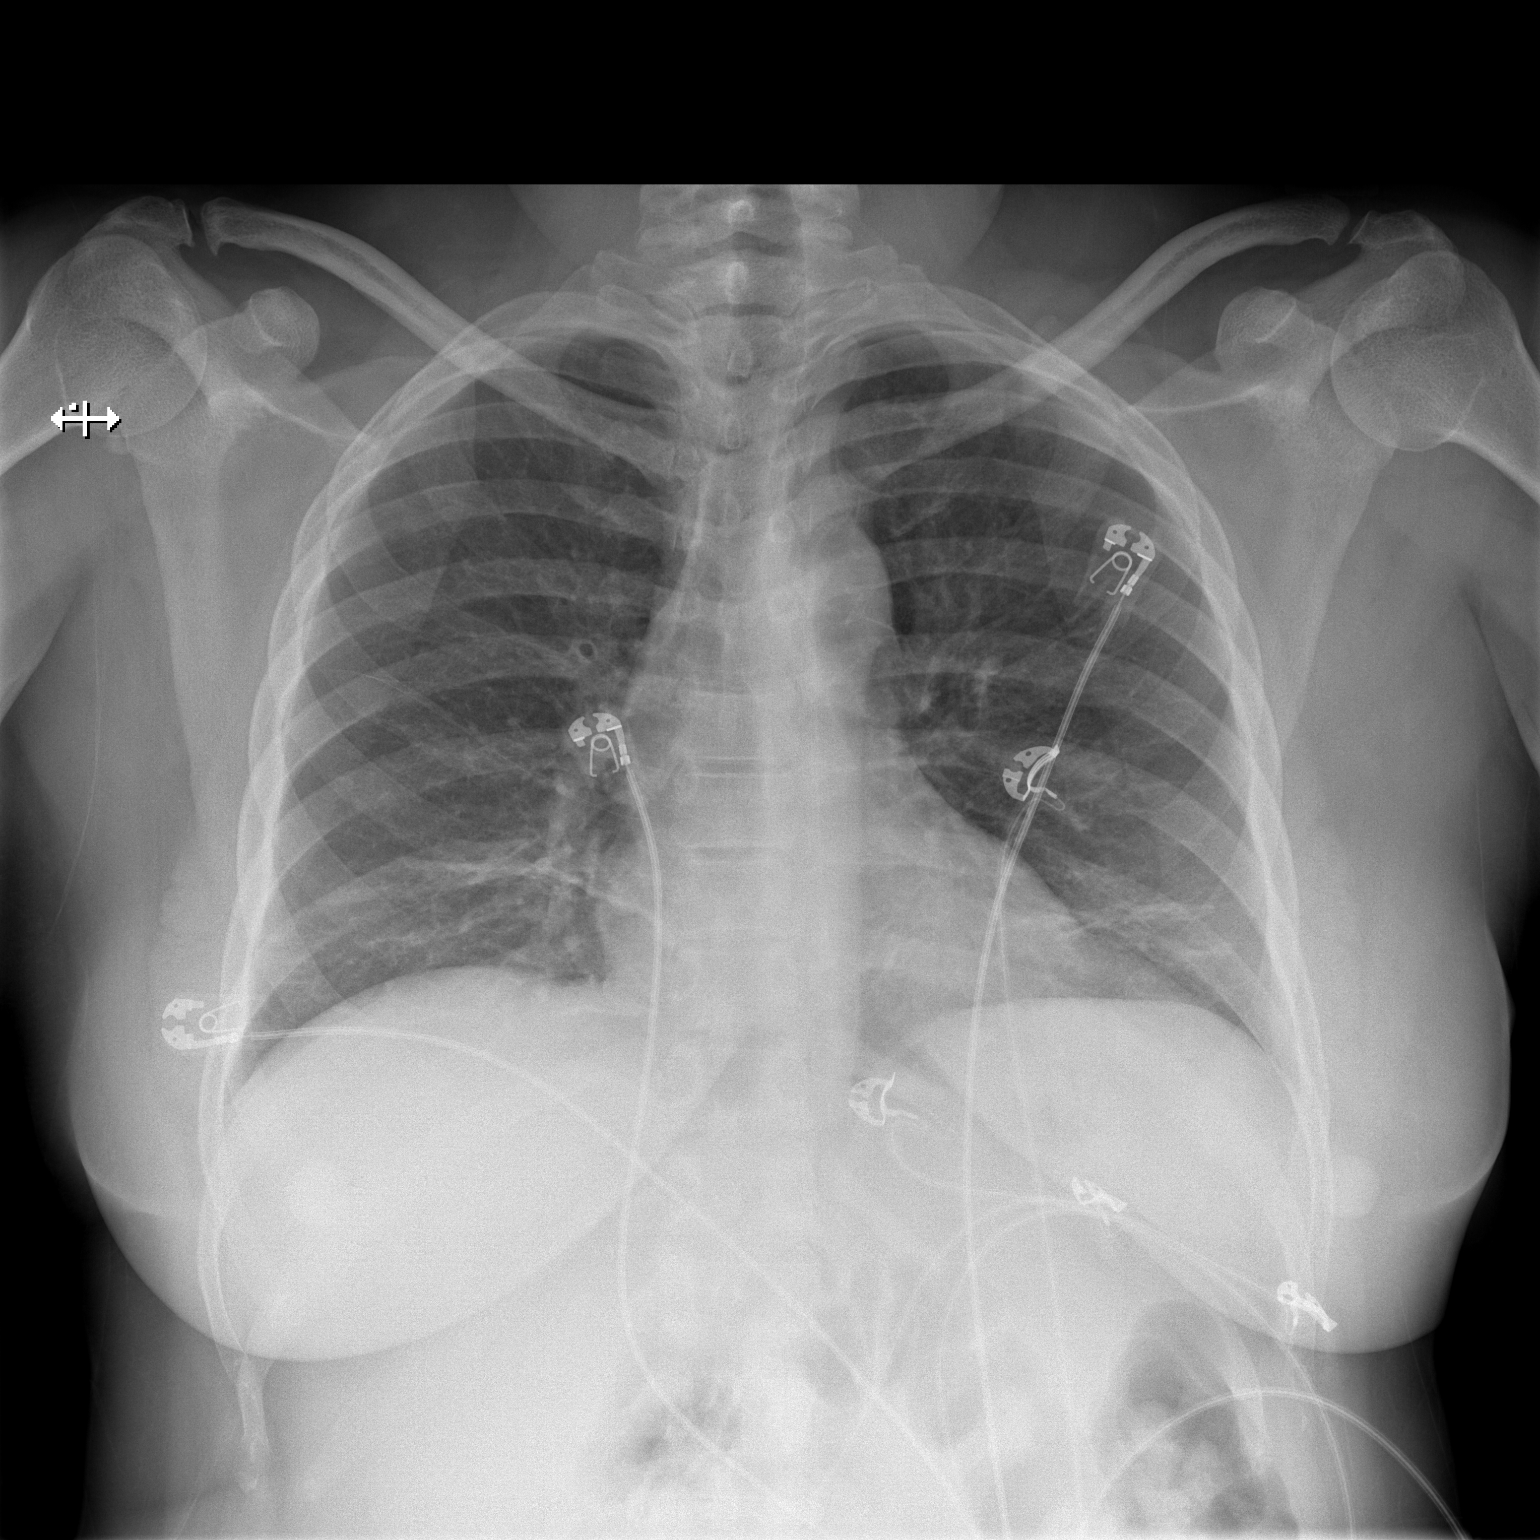

[w chest lat]
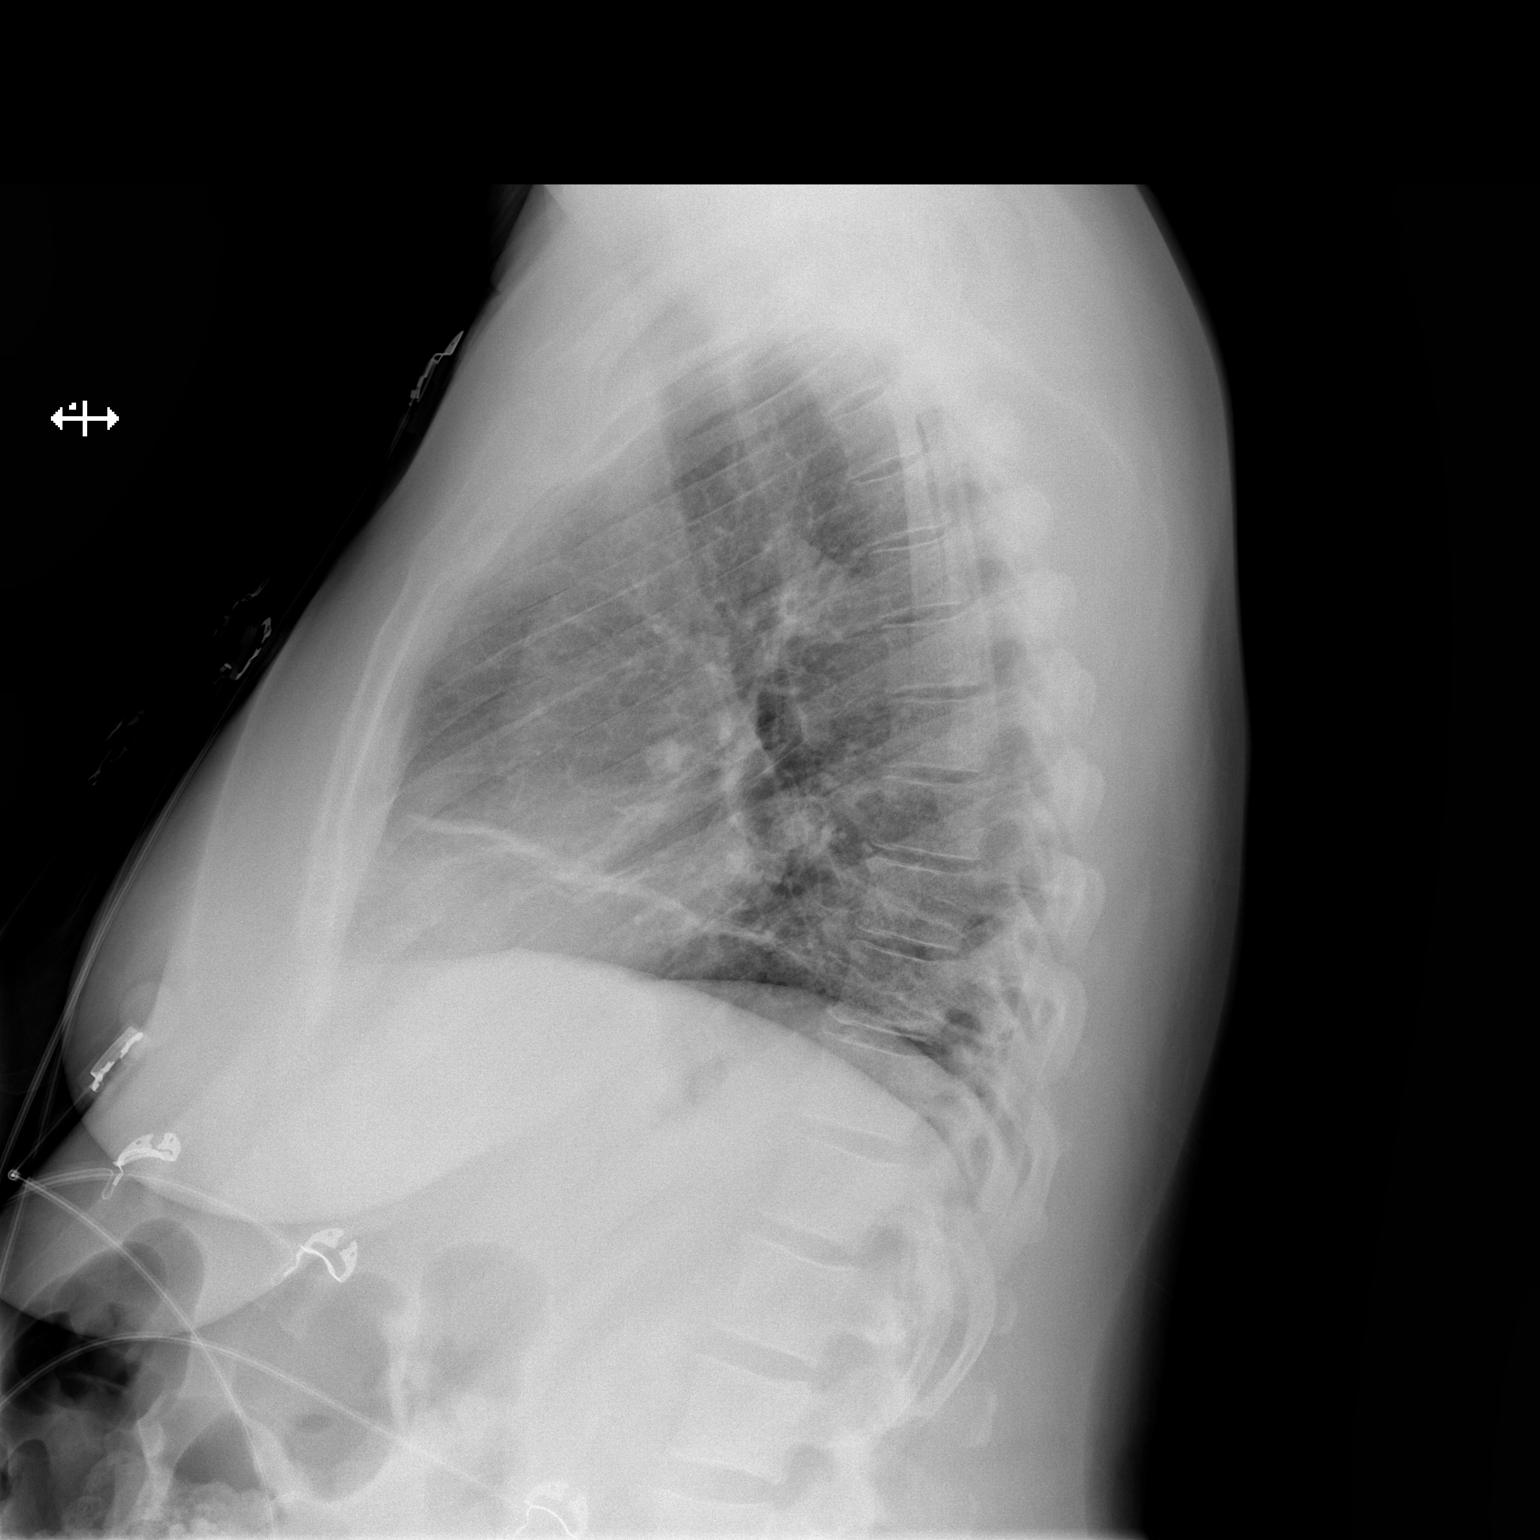

[2 of 2 positions shown; findings below may reference images not displayed]

FINDINGS: Chronic scarring at the lung bases. Cardiac and mediastinal margins
appear normal. Mild airway thickening. The right mid lung opacity
shown on the [DATE] exam has resolved. No blunting of the
costophrenic angles.
IMPRESSION: 1. Stable bibasilar scarring. The right perihilar/mid lung opacity
has resolved.
2. Airway thickening is present, suggesting bronchitis or reactive
airways disease.

## 2021-03-12 MED ORDER — FENTANYL CITRATE (PF) 100 MCG/2ML IJ SOLN
50.0000 ug | Freq: Once | INTRAMUSCULAR | Status: AC
  Administered 2021-03-12: 50 ug via INTRAVENOUS
  Filled 2021-03-12: qty 2

## 2021-03-12 MED ORDER — ONDANSETRON HCL 4 MG/2ML IJ SOLN
4.0000 mg | Freq: Once | INTRAMUSCULAR | Status: AC
  Administered 2021-03-12: 4 mg via INTRAVENOUS
  Filled 2021-03-12: qty 2

## 2021-03-12 NOTE — Telephone Encounter (Addendum)
Attempted to call patient's husband- no DPR on file, left message for patient to call back to office.

## 2021-03-12 NOTE — ED Triage Notes (Signed)
Pt arriving with right side chest pain that has been present for a couple weeks. Pt reports acid reflux type symptoms as well. Pt states she has not taken anything for her symptoms, came straight to ER this morning after leaving sleep study.

## 2021-03-12 NOTE — ED Notes (Signed)
I attempted x1 blood draw attempt as noted, UTO venous access. Pt advised she was a "Hard stick"

## 2021-03-12 NOTE — Telephone Encounter (Signed)
Patient's husband calling to inform the patient was seen in the ED and requests a call back to discuss her visit.

## 2021-03-12 NOTE — ED Provider Notes (Signed)
[facility]-EMERGENCY DEPT Provider Note   CSN: [id number] Arrival date & time: 03/12/21  1224     History Chief Complaint  Patient presents with   Chest Pain    Maria Harper is a 50 y.o. female.  Maria Harper was participating in a sleep study.  At 6 AM, she developed right-sided chest pain described as pressure.  This was associated with shortness of breath and nausea.  For the past 2 weeks, she has had some similar pain which has been relieved by nitroglycerin.  She does have a history of coronary artery disease, and per her last cardiology note, the existing coronary artery disease cannot be managed with revascularization.   Chest Pain Associated symptoms: no abdominal pain, no back pain, no cough, no fever, no palpitations, no shortness of breath and no vomiting    HPI: A 50 year old patient with a history of CVA, treated diabetes, hypertension, hypercholesterolemia and obesity presents for evaluation of chest pain. Initial onset of pain was approximately 1-3 hours ago. The patient's chest pain is described as heaviness/pressure/tightness and is not worse with exertion. The patient complains of nausea. The patient's chest pain is not middle- or left-sided, is not well-localized, is not sharp and does not radiate to the arms/jaw/neck. The patient denies diaphoresis. The patient has smoked in the past 90 days. The patient has no history of peripheral artery disease and has no relevant family history of coronary artery disease (first degree relative at less than age 19).   Past Medical History:  Diagnosis Date   Acid reflux    Cocaine abuse (HCC)    Coronary artery disease, occlusive 2017   2017 or 2018-PCI to LAD (mid-distal); -> anterior STEMI April 2021 -> LAD stent patent, however distal LAD at D3 occluded, only able to reduce to 70% stenosis.  Small caliber RPDA with 80% stenosis.   Diabetes mellitus without complication (HCC)    Heart attack  (HCC) 04/2016   High cholesterol    Hypertension    Ischemic cardiomyopathy 11/2019   Tobacco abuse     Patient Active Problem List   Diagnosis Date Noted   Chronic heart failure with preserved ejection fraction (HFpEF) (HCC) 02/01/2021   Coronary artery disease involving native coronary artery of native heart with angina pectoris (HCC)    Acute CVA (cerebrovascular accident) (HCC) 03/23/2020   Hyperglycemia due to type 2 diabetes mellitus (HCC) 03/23/2020   Essential hypertension 11/28/2019   UTI (urinary tract infection) 11/28/2019   Hyperlipidemia associated with type 2 diabetes mellitus (HCC)    Tobacco abuse    Type II diabetes mellitus with neurological manifestations, uncontrolled (HCC) 11/25/2019   ST elevation myocardial infarction (STEMI) of anterior wall (HCC) 11/23/2019   CAD S/P percutaneous coronary angioplasty 11/23/2019   Sepsis (HCC) 09/19/2019   Cocaine abuse with cocaine-induced mood disorder (HCC) 06/10/2017    Past Surgical History:  Procedure Laterality Date   CORONARY/GRAFT ACUTE MI REVASCULARIZATION N/A 11/23/2019   Procedure: CORONARY/GRAFT ACUTE MI REVASCULARIZATION;  Surgeon: [name], MD;  Location: [facility] INVASIVE CV LAB;  Service: Cardiovascular; unsuccessful PTCA of distal LAD 100%.  O able to restore only minimal flow with 70% stenosis   LEFT HEART CATH AND CORONARY ANGIOGRAPHY N/A 11/23/2019   Procedure: LEFT HEART CATH AND CORONARY ANGIOGRAPHY;  Surgeon: [name], MD;  Location: [facility] INVASIVE CV LAB;  Service: Cardiovascular; ANTERIOR STEMI: Widely patent mid to distal LAD stent, distal LAD is 100% stenosed associated with the third  diagonal.  (Post PTCA - 70% residual but 100% D3).  RPDA tapers to a small vessel -80% stenosis.  Severely elevated LVEDP   NM MYOVIEW LTD  08/27/2020   (recurrent chest pain): Poor study due to gut attenuation.  EF ~50-55%.  No EKG changes.  MLSTLY FIXED LARGE/SEVRE perfusion defect in the basal-apical inferior,  mid inferolateral & mid-apical anterior / apical walls: Findings are c/w known anatomy (apical LAD occlusion & PDA 80%, with gut attenuation enhancing inferior involvement -> but Echo with normal EF & no RWA - contradicts this finding.   TRANSTHORACIC ECHOCARDIOGRAM  11/24/2019   -post anterior STEMI: EF mildly reduced 45 to 50%.  GR 1 DD.  Elevated LAP.  No obvious heart WMA.  Moderate-severe AI with mild-moderate Aortic Sclerosis but no Stenosis.   TRANSTHORACIC ECHOCARDIOGRAM  03/23/2020   (stroke evaluation): EF improved to 55 to 60%.  Significant LVH-notable apical.  Consider cardiac MRI.  Moderate concentric LVH.  GR 1 DD.  Elevated LAP.  Mild to moderate Aortic Sclerosis but no Stenosis.  Moderate AI.      OB History   No obstetric history on file.     Family History  Problem Relation Age of Onset   Diabetes Mother    Diabetes Father    Diabetes Maternal Grandmother    Diabetes Maternal Grandfather     Social History   Tobacco Use   Smoking status: Every Day    Packs/day: 1.50    Years: 20.00    Pack years: 30.00    Types: Cigarettes   Smokeless tobacco: Never  Vaping Use   Vaping Use: Never used  Substance Use Topics   Alcohol use: Not Currently   Drug use: Yes    Types: "Crack" cocaine    Comment: last used yesterday. States " I do it everyday"     Home Medications Prior to Admission medications   Medication Sig Start Date End Date Taking? Authorizing Provider  acetaminophen (TYLENOL) 500 MG tablet Take 1,000 mg by mouth in the morning, at noon, and at bedtime. 09/23/20   [provider]  amLODipine (NORVASC) 2.5 MG tablet Take 1 tablet (2.5 mg total) by mouth daily. 01/26/21 04/26/21  [name], MD  atorvastatin (LIPITOR) 40 MG tablet Take 1 tablet (40 mg total) by mouth daily. 02/05/21 05/06/21  [name], MD  carvedilol (COREG) 25 MG tablet Take 1 tablet (25 mg total) by mouth 2 (two) times daily with a meal. 07/15/20   Kroeger, Dot Lanes M., PA-C   CLARITIN 10 MG tablet Take 10 mg by mouth daily. 01/13/21   [provider]  clopidogrel (PLAVIX) 75 MG tablet Take 1 tablet (75 mg total) by mouth daily. 01/26/21   [name], MD  cyclobenzaprine (FLEXERIL) 10 MG tablet Take 10 mg by mouth 3 (three) times daily as needed for muscle spasms.     [provider]  doxepin (SINEQUAN) 50 MG capsule Take 50 mg by mouth in the morning, at noon, and at bedtime.    [provider]  empagliflozin (JARDIANCE) 10 MG TABS tablet Take 1 tablet (10 mg total) by mouth daily before breakfast. 07/15/20   Kroeger, Ovidio Kin., PA-C  Evolocumab (REPATHA SURECLICK) 140 MG/ML SOAJ Inject 140 mg into the skin every 14 (fourteen) days. Patient taking differently: Inject 140 mg into the skin every 21 ( twenty-one) days. 09/26/20   Kroeger, Ovidio Kin., PA-C  gabapentin (NEURONTIN) 300 MG capsule Take 300 mg by mouth 3 (  three) times daily.    [provider]  HUMULIN R 100 UNIT/ML injection Inject 16 Units into the skin 2 (two) times daily before a meal. 06/13/18   [provider]  ibuprofen (ADVIL) 800 MG tablet Take 800 mg by mouth 3 (three) times daily as needed. 01/20/21   [provider]  insulin NPH-regular Human (70-30) 100 UNIT/ML injection Inject 40 Units into the skin 2 (two) times daily. 07/01/17   [provider]  ipratropium (ATROVENT HFA) 17 MCG/ACT inhaler Inhale 2 puffs into the lungs every 6 (six) hours.    [provider]  isosorbide mononitrate (IMDUR) 60 MG 24 hr tablet Take 1 and 1/2 tablet ( total 90 mg)  by mouth daily .    [provider]  nitroGLYCERIN (NITROSTAT) 0.4 MG SL tablet Place 1 tablet (0.4 mg total) under the tongue as needed for chest pain. 07/15/20   Kroeger, Ovidio Kin., PA-C  pantoprazole (PROTONIX) 40 MG tablet Take 40 mg by mouth 2 (two) times daily.     [provider]  polyethylene glycol powder (GLYCOLAX/MIRALAX) powder Take 17 g by mouth daily  as needed for moderate constipation.  04/05/18   [provider]  QUEtiapine (SEROQUEL) 400 MG tablet Take 400 mg by mouth at bedtime. 04/05/18   [provider]    Allergies    Bee venom, Contrast media [iodinated diagnostic agents], and Tomato  Review of Systems   Review of Systems  Constitutional:  Negative for chills and fever.  HENT:  Negative for ear pain and sore throat.   Eyes:  Negative for pain and visual disturbance.  Respiratory:  Negative for cough and shortness of breath.   Cardiovascular:  Positive for chest pain. Negative for palpitations.  Gastrointestinal:  Negative for abdominal pain and vomiting.  Genitourinary:  Negative for dysuria and hematuria.  Musculoskeletal:  Negative for arthralgias and back pain.  Skin:  Negative for color change and rash.  Neurological:  Negative for seizures and syncope.  All other systems reviewed and are negative.  Physical Exam Updated Vital Signs BP 133/87 (BP Location: Left Arm)   Pulse 77   Temp 98.3 F (36.8 C) (Oral)   Resp 18   Ht 5\' 2"  (1.575 m)   Wt 78.5 kg   SpO2 98%   BMI 31.64 kg/m   Physical Exam Vitals and nursing note reviewed.  Constitutional:      Appearance: She is well-developed.  HENT:     Head: Normocephalic and atraumatic.  Cardiovascular:     Rate and Rhythm: Normal rate and regular rhythm.     Heart sounds: Normal heart sounds.  Pulmonary:     Effort: Pulmonary effort is normal. No tachypnea.     Breath sounds: Normal breath sounds.  Abdominal:     Palpations: Abdomen is soft.     Tenderness: There is no abdominal tenderness.  Musculoskeletal:     Right lower leg: No edema.     Left lower leg: No edema.  Skin:    General: Skin is warm and dry.  Neurological:     General: No focal deficit present.     Mental Status: She is alert and oriented to person, place, and time.  Psychiatric:        Mood and Affect: Mood normal.        Behavior: Behavior normal.    ED Results /  Procedures / Treatments   Labs (all labs ordered are listed, but only abnormal results  are displayed) Labs Reviewed  BASIC METABOLIC PANEL - Abnormal; Notable for the following components:      Result Value   Sodium 133 (*)    Glucose, Bld 360 (*)    BUN 22 (*)    Creatinine, Ser 1.21 (*)    GFR, Estimated 55 (*)    All other components within normal limits  HEPATIC FUNCTION PANEL - Abnormal; Notable for the following components:   Total Protein 8.5 (*)    All other components within normal limits  CBC  D-DIMER, QUANTITATIVE  I-STAT BETA HCG BLOOD, ED ([facility], [facility], [facility] ONLY)  TROPONIN I (HIGH SENSITIVITY)  TROPONIN I (HIGH SENSITIVITY)    EKG EKG Interpretation  Date/Time:  Thursday March 12 2021 06:21:58 EDT Ventricular Rate:  82 PR Interval:  141 QRS Duration: 96 QT Interval:  394 QTC Calculation: 461 R Axis:   -43 Text Interpretation: Sinus rhythm Probable left atrial enlargement Left anterior fascicular block Left ventricular hypertrophy Anterior Q waves, possibly due to LVH 12 Lead; Mason-Likar Confirmed by Zadie Rhine ([id number]) on 03/12/2021 6:25:21 AM  Radiology DG Chest 2 View  Result Date: 03/12/2021 CLINICAL DATA:  Right mid chest pain and shortness of breath worsening over the last 2 weeks. EXAM: CHEST - 2 VIEW COMPARISON:  11/26/2020 FINDINGS: Chronic scarring at the lung bases. Cardiac and mediastinal margins appear normal. Mild airway thickening. The right mid lung opacity shown on the 11/26/2020 exam has resolved. No blunting of the costophrenic angles. IMPRESSION: 1. Stable bibasilar scarring. The right perihilar/mid lung opacity has resolved. 2. Airway thickening is present, suggesting bronchitis or reactive airways disease. Electronically Signed   By: Gaylyn Rong M.D.   On: 03/12/2021 07:25    Procedures Procedures   Medications Ordered in ED Medications  fentaNYL (SUBLIMAZE) injection 50 mcg (has no administration in time range)  ondansetron  (ZOFRAN) injection 4 mg (has no administration in time range)    ED Course  I have reviewed the triage vital signs and the nursing notes.  Pertinent labs & imaging results that were available during my care of the patient were reviewed by me and considered in my medical decision making (see chart for details).    MDM Rules/Calculators/A&P HEAR Score: 5                         Maria Harper has a history of coronary artery disease.  She presented with right-sided chest pain.  Troponins are both 4, and her ED work-up was otherwise unremarkable except for mild hyperglycemia.  She was advised to continue her medication and follow closely with cardiology.  I also considered biliary pathology, PE, pneumonia. Final Clinical Impression(s) / ED Diagnoses Final diagnoses:  Nonspecific chest pain  CAD S/P percutaneous coronary angioplasty  Type 2 diabetes mellitus with hyperglycemia, with long-term current use of insulin (HCC)  Tobacco abuse    Rx / DC Orders ED Discharge Orders     None        Koleen Distance, MD 03/12/21 1018

## 2021-03-14 DIAGNOSIS — R0681 Apnea, not elsewhere classified: Secondary | ICD-10-CM

## 2021-03-14 NOTE — Procedures (Signed)
    Patient Name: Maria Harper, Maria Harper Date: 03/11/2021 Gender: Female D.O.B: [birth year] Age (years): 54 Referring Provider: Lavinia Sharps NP Height (inches): 62 Interpreting Physician: Jetty Duhamel MD, ABSM Weight (lbs): 173 RPSGT: Rolene Arbour BMI: 32 MRN: [id number]  CLINICAL INFORMATION Sleep Study Type: NPSG Indication for sleep study: Somnolence Epworth Sleepiness Score: 14  SLEEP STUDY TECHNIQUE As per the AASM Manual for the Scoring of Sleep and Associated Events v2.3 (April 2016) with a hypopnea requiring 4% desaturations.  The channels recorded and monitored were frontal, central and occipital EEG, electrooculogram (EOG), submentalis EMG (chin), nasal and oral airflow, thoracic and abdominal wall motion, anterior tibialis EMG, snore microphone, electrocardiogram, and pulse oximetry.  MEDICATIONS Medications self-administered by patient taken the night of the study : Seroquel  SLEEP ARCHITECTURE The study was initiated at 11:01:04 PM and ended at 5:10:13 AM.  Sleep onset time was 0.0 minutes and the sleep efficiency was 84.7%%. The total sleep time was 312.6 minutes.  Stage REM latency was 152.8 minutes.  The patient spent 1.0%% of the night in stage N1 sleep, 90.7%% in stage N2 sleep, 0.0%% in stage N3 and 8.3% in REM.  Alpha intrusion was absent.  Supine sleep was 26.51%.  RESPIRATORY PARAMETERS The overall apnea/hypopnea index (AHI) was 0.2 per hour. There were 0 total apneas, including 0 obstructive, 0 central and 0 mixed apneas. There were 1 hypopneas and 1 RERAs.  The AHI during Stage REM sleep was 0.0 per hour.  AHI while supine was 0.0 per hour.  The mean oxygen saturation was 94.2%. The minimum SpO2 during sleep was 82.0%.  moderate snoring was noted during this study.  CARDIAC DATA The 2 lead EKG demonstrated sinus rhythm. The mean heart rate was 80.2 beats per minute. Other EKG findings include: None.  LEG MOVEMENT DATA The total  PLMS were 0 with a resulting PLMS index of 0.0. Associated arousal with leg movement index was 0.2 .  IMPRESSIONS - No significant obstructive sleep apnea occurred during this study (AHI = 0.2/h). - Oxygen desaturation was noted during this study (Min O2 = 82.0%). Mean O2 saturation 94.6%. - The patient snored with moderate snoring volume. - No cardiac abnormalities were noted during this study. - Limb movements were frequent, total 222 ( 42/ hr), but with insignificant sleep disturbance- LM with arousal 1.0.  DIAGNOSIS - Primary snoring  RECOMMENDATIONS - Sleep hygiene should be reviewed to assess factors that may improve sleep quality. - Weight management and regular exercise should be initiated or continued if appropriate.  [Electronically signed] 03/14/2021 11:23 AM  Jetty Duhamel MD, ABSM Diplomate, American Board of Sleep Medicine   NPI: [id number]                         Jetty Duhamel Diplomate, American Board of Sleep Medicine  ELECTRONICALLY SIGNED ON:  03/14/2021, 11:17 AM [facility] PH: [phone]   FX: [phone] ACCREDITED BY THE AMERICAN ACADEMY OF SLEEP MEDICINE

## 2021-03-16 NOTE — Telephone Encounter (Signed)
Unable to contact pt will await CB 

## 2021-03-24 NOTE — Telephone Encounter (Signed)
Patient was returning call 

## 2021-03-24 NOTE — Telephone Encounter (Signed)
Lm with husband for pt to call back ./cy

## 2021-03-24 NOTE — Telephone Encounter (Signed)
Spoke to patient's husband.  Patient has follow up appointment from ER visit on 04/10/21.  Per husband, no issue since ER visit  ( there for @ 4 hours)

## 2021-04-04 NOTE — Progress Notes (Deleted)
Cardiology Office Note:    Date:  04/04/2021   ID:  SHERYL AMEZOLA, DOB 10/07/1970, MRN [id number]  PCP:  Lavinia Sharps, NP  Cardiologist:  Bryan Lemma, MD  Electrophysiologist:  None   Referring MD: Lavinia Sharps, NP   Chief Complaint: follow-up of ED visit for chest pain  History of Present Illness:    Maria Harper is a 50 y.o. female with a history of CAD s/p DES to LAD in 2018 with anterior STEMI in 11/2019 (treated medically), ischemic cardiomyopathy with normalization of EF to 55-60% on Echo in 03/2020, stroke in 03/2020, hypertension, hyperlipidemia, type 2 diabetes mellitus, cocaine abuse, and tobacco abuse who is followed by Dr. Herbie Baltimore and presents today for follow-up of ED visit for chest pain.   Patient has a history of CAD and underwent DES to LAD in 2018 in [place], [place]. He then present with anterior STEMI in 11/2019. Previous stent to LAD was patient but patient did have a 100% stenosis of distal LAD and 100% stenosis of side branch in 3rd Diag. PTCA was performed and reduced LAD lesion to 70% but was unable to cross 3rd Diag lesion. Also noted to have 80% stenosis of RPDA that was too small for PCI. Echo at this time showed LVEF of 45-50% with normal wall motion, grade 1 diastolic dysfunction, and moderate to severe AI. Patient was started on Imdur and Coreg. Patient was readmitted in 03/2020 with a stroke like symptoms. Brain MRI showed small focus of acute/subacute ischemia within the left thalamus. Echo showed LVEF of 55-60% with significant LVH predominantly in the apical segments, grade 1 diastolic dysfunction, and mild MR, and moderate AI. At office visit in 07/2020, she continue to report intermittent chest pain generally relieved by sublingual Nitro and chronic dyspnea on exertion. Myoview was ordered but was a very poor study due to severe extracardiac tracer update in the gut making imaging essentially uninterpretable but there was a fixed efect in the  anterior apical wall and apex which goes along with her known anatomy. Dr. Herbie Baltimore reviewed images and felt symptoms were probably due to coronary spasms and microvascular disease. Unfortunately, all of her disease is distal and not revascularizable. She was last seen by Dr. Herbie Baltimore in 12/2020 at which time she continued to report off and on chest pain not always associated with activity. She was started on Amlodipine 2.5mg  daily in lieu of ARB.   Since last visit, she was seen in the ED on 03/12/2021 with chest pain. She was reportedly participating in a sleep study when she developed right sided chest pain at 6am. This was associated with shortness of breath and nausea. EKG showed no acute ischemic changes. High-sensitivity troponin negative x2. D-dimer negative. Therefore, she was felt to be stable for discharge with close follow-up with Cardiology.  Patient presents today for follow-up. ***  CAD with Chronic Chest Pain - S/p DES to LAD in 2018. History of anterior STEMI in 11/2019 - LHC showed patent LAD stent with 100% stenosis of distal LAD and 100% stenosis of side branch in 3rd Diag. PTCA was performed and reduced LAD lesion to 70% but was unable to cross 3rd Diag lesion. Also noted to have 80% stenosis of RPDA that was too small for PCI. Treated medically. Myoview in 08/2020 was very poor quality but did show fixed defect in anterior region. All disease is distal and not revascularizable. - *** -  Continue Amlodipine 2.5mg  daily, Coreg 25mg  twice daily, and Imdur  90mg  daily.  - Continue Plavix 75mg  daily. - Continue Lipitor 40mg  daily and Repatha.   Ischemic Cardiomyopathy - Echo in 03/2020 showed LVEF of 55-60% with significant LVH predominantly in the apical segments, grade 1 diastolic dysfunction, and mild MR, and moderate AI. - Euvolemic on exam.  - Continue Coreg 25mg  twice daily.  - Continue Imdur 90mg  daily.  - Continue Jardiance 10mg  daily. - Discussed importance of daily weight and  sodium/fluid restrictions.   Hypertension - *** - Continue Amlodipine, Coreg, and Imdur as above.  Hyperlipidemia - Lipid panel in 12/2020: Total Cholesterol 108, Triglycerides 241, HDL 43, LDL 28.  - LDL at goal given history of CAD. - Continue Crestor 40mg  daily and Repatha.   Type 2 Diabetes Mellitus  - Management per PCP.   Past Medical History:  Diagnosis Date   Acid reflux    Cocaine abuse (HCC)    Coronary artery disease, occlusive 2017   2017 or 2018-PCI to LAD (mid-distal); -> anterior STEMI April 2021 -> LAD stent patent, however distal LAD at D3 occluded, only able to reduce to 70% stenosis.  Small caliber RPDA with 80% stenosis.   Diabetes mellitus without complication (HCC)    Heart attack (HCC) 04/2016   High cholesterol    Hypertension    Ischemic cardiomyopathy 11/2019   Tobacco abuse     Past Surgical History:  Procedure Laterality Date   CORONARY/GRAFT ACUTE MI REVASCULARIZATION N/A 11/23/2019   Procedure: CORONARY/GRAFT ACUTE MI REVASCULARIZATION;  Surgeon: , MD;  Location[facility] INVASIVE CV LAB;  Service: Cardiovascular; unsuccessful PTCA of distal LAD 100%.  O able to restore only minimal flow with 70% stenosis   LEFT HEART CATH AND CORONARY ANGIOGRAPHY N/A 11/23/2019   Procedure: LEFT HEART CATH AND CORONARY ANGIOGRAPHY;  Surgeon: May 2021, MD;  Location: [facility] INVASIVE CV LAB;  Service: Cardiovascular; ANTERIOR STEMI: Widely patent mid to distal LAD stent, distal LAD is 100% stenosed associated with the third diagonal.  (Post PTCA - 70% residual but 100% D3).  RPDA tapers to a small vessel -80% stenosis.  Severely elevated LVEDP   NM MYOVIEW LTD  08/27/2020   (recurrent chest pain): Poor study due to gut attenuation.  EF ~50-55%.  No EKG changes.  MLSTLY FIXED LARGE/SEVRE perfusion defect in the basal-apical inferior, mid inferolateral & mid-apical anterior / apical walls: Findings are c/w known anatomy (apical LAD occlusion & PDA 80%, with  gut attenuation enhancing inferior involvement -> but Echo with normal EF & no RWA - contradicts this finding.   TRANSTHORACIC ECHOCARDIOGRAM  11/24/2019   -post anterior STEMI: EF mildly reduced 45 to 50%.  GR 1 DD.  Elevated LAP.  No obvious heart WMA.  Moderate-severe AI with mild-moderate Aortic Sclerosis but no Stenosis.   TRANSTHORACIC ECHOCARDIOGRAM  03/23/2020   (stroke evaluation): EF improved to 55 to 60%.  Significant LVH-notable apical.  Consider cardiac MRI.  Moderate concentric LVH.  GR 1 DD.  Elevated LAP.  Mild to moderate Aortic Sclerosis but no Stenosis.  Moderate AI.     Current Medications: No outpatient medications have been marked as taking for the 04/10/21 encounter (Appointment) with 11/25/2019, PA-C.     Allergies:   Bee venom, Contrast media [iodinated diagnostic agents], and Tomato   Social History   Socioeconomic History   Marital status: Married    Spouse name: Not on file   Number of children: Not on file   Years of education: Not on file  Highest education level: Not on file  Occupational History   Not on file  Tobacco Use   Smoking status: Every Day    Packs/day: 1.50    Years: 20.00    Pack years: 30.00    Types: Cigarettes   Smokeless tobacco: Never  Vaping Use   Vaping Use: Never used  Substance and Sexual Activity   Alcohol use: Not Currently   Drug use: Yes    Types: "Crack" cocaine    Comment: last used yesterday. States " I do it everyday"    Sexual activity: Not on file  Other Topics Concern   Not on file  Social History Narrative   Not on file   Social Determinants of Health   Financial Resource Strain: Not on file  Food Insecurity: Not on file  Transportation Needs: Not on file  Physical Activity: Not on file  Stress: Not on file  Social Connections: Not on file     Family History: The patient's family history includes Diabetes in her father, maternal grandfather, maternal grandmother, and mother.  ROS:   Please  see the history of present illness.     EKGs/Labs/Other Studies Reviewed:    The following studies were reviewed today:  Cardiac Catheterization 11/23/2019: Previously placed Mid LAD to Dist LAD stent (unknown type) is widely patent. Mid LAD lesion is 30% stenosed. CULPRIT LESION: Dist LAD-1 lesion is 100% stenosed with 100% stenosed side branch in 3rd Diag. Unsuccessful balloon angioplasty was performed using a BALLOON SAPPHIRE 2.0X12 -unable to advance into the distal vessel. Post intervention, there is a 70% residual stenosis followed by Dist LAD-2 lesion is 99% stenosed. Post intervention, the side branch was remained at 100% residual stenosis. -------------------------- RPDA lesion is 80% stenosed-tapers into very small vessel.. LV end diastolic pressure is severely elevated. Therefore no the ventriculogram was performed. There is no aortic valve stenosis.  Diagnostic Dominance: Right Intervention   _______________  Echocardiogram 03/23/2020: Impressions: 1. LVEF has improved since the prior study on 11/24/2019, from 45-50% to  55-60%. There is significant LVH predominantly in the apical segments, a  cardiac MRI should be considered to evaluate for possible infiltrative and  hypertrophic cardiomyopathies.   2. Left ventricular ejection fraction, by estimation, is 55 to 60%. The  left ventricle has normal function. The left ventricle has no regional  wall motion abnormalities. There is moderate concentric left ventricular  hypertrophy. Left ventricular  diastolic parameters are consistent with Grade I diastolic dysfunction  (impaired relaxation). Elevated left atrial pressure.   3. Right ventricular systolic function is normal. The right ventricular  size is normal.   4. The mitral valve is normal in structure. Mild mitral valve  regurgitation. No evidence of mitral stenosis.   5. The aortic valve is tricuspid. Aortic valve regurgitation is moderate.  Mild to moderate aortic  valve sclerosis/calcification is present, without  any evidence of aortic stenosis.   6. The inferior vena cava is normal in size with greater than 50%  respiratory variability, suggesting right atrial pressure of 3 mmHg.  _______________  Myoview 08/27/2020: Very poor study due to severe extracardiac tracer uptake in the gut making imaging essentially uninterpretable. The left ventricular ejection fraction is mildly decreased (45-54%). Nuclear stress EF: 53%. There was no ST segment deviation noted during stress. No T wave inversion was noted during stress. There is a large defect of severe severity present in the basal inferior, mid anterior, mid inferior, mid inferolateral, apical anterior, apical inferior and apex location  possibly artifact due to significant extracardiac tracer uptake in the gut. Based on prior TTE, the EF was normal with no wall motion abnormalities, which does not correlate with perfusion on nuclear study. Recommend coronary CTA to better assess possiblity of obstructive CAD.  EKG:  EKG *** ordered today. EKG personally reviewed and demonstrates ***.  Recent Labs: 03/12/2021: ALT 13; BUN 22; Creatinine, Ser 1.21; Hemoglobin 14.8; Platelets 175; Potassium 4.4; Sodium 133  Recent Lipid Panel    Component Value Date/Time   CHOL 108 01/26/2021 1210   TRIG 241 (H) 01/26/2021 1210   HDL 43 01/26/2021 1210   CHOLHDL 2.5 01/26/2021 1210   CHOLHDL 4.5 03/23/2020 1237   VLDL 39 03/23/2020 1237   LDLCALC 28 01/26/2021 1210    Physical Exam:    Vital Signs: There were no vitals taken for this visit.    Wt Readings from Last 3 Encounters:  03/12/21 173 lb (78.5 kg)  03/11/21 173 lb (78.5 kg)  01/26/21 178 lb 3.2 oz (80.8 kg)     General: 50 y.o. female in no acute distress. HEENT: Normocephalic and atraumatic. Sclera clear. EOMs intact. Neck: Supple. No carotid bruits. No JVD. Heart: *** RRR. Distinct S1 and S2. No murmurs, gallops, or rubs. Radial and distal  pedal pulses 2+ and equal bilaterally. Lungs: No increased work of breathing. Clear to ausculation bilaterally. No wheezes, rhonchi, or rales.  Abdomen: Soft, non-distended, and non-tender to palpation. Bowel sounds present in all 4 quadrants.  MSK: Normal strength and tone for age. *** Extremities: No lower extremity edema.    Skin: Warm and dry. Neuro: Alert and oriented x3. No focal deficits. Psych: Normal affect. Responds appropriately.   Assessment:    No diagnosis found.  Plan:     Disposition: Follow up in ***   Medication Adjustments/Labs and Tests Ordered: Current medicines are reviewed at length with the patient today.  Concerns regarding medicines are outlined above.  No orders of the defined types were placed in this encounter.  No orders of the defined types were placed in this encounter.   There are no Patient Instructions on file for this visit.   Signed, Corrin Parker, PA-C  04/04/2021 10:26 AM    [facility]

## 2021-04-10 ENCOUNTER — Ambulatory Visit: Payer: Self-pay | Admitting: Student

## 2021-06-04 NOTE — Progress Notes (Signed)
Cardiology Office Note:    Date:  06/18/2021   ID:  [name], DOB Oct 02, 1970, MRN [id number]  PCP:  Marliss Coots, NP  Cardiologist:  Glenetta Hew, MD  Electrophysiologist:  None   Referring MD: Marliss Coots, NP   Chief Complaint: follow-up of chest pain  History of Present Illness:    Maria Harper is a 50 y.o. female with a history of CAD s/p DES to LAD in 2018 with anterior STEMI in 11/2019 (treated medically), ischemic cardiomyopathy with normalization of EF to 55-60% on Echo in 03/2020, stroke in 03/2020, hypertension, hyperlipidemia, type 2 diabetes mellitus, cocaine abuse, and tobacco abuse who is followed by Dr. Ellyn Hack and presents today for follow-up of ED visit for chest pain.   Patient has a history of CAD and underwent DES to LAD in 2018 in [place], [place]. He then presented with an anterior STEMI in 11/2019. Previous stent to LAD was patent but patient did have a 100% stenosis of distal LAD and 100% stenosis of side branch in 3rd Diag. PTCA was performed and reduced LAD lesion to 70% but was unable to cross 3rd Diag lesion. Also noted to have 80% stenosis of RPDA that was too small for PCI. Echo at this time showed LVEF of 45-50% with normal wall motion, grade 1 diastolic dysfunction, and moderate to severe AI. Patient was started on Imdur and Coreg. Patient was readmitted in 03/2020 with a stroke like symptoms. Brain MRI showed small focus of acute/subacute ischemia within the left thalamus. Echo showed LVEF of 55-60% with significant LVH predominantly in the apical segments, grade 1 diastolic dysfunction, and mild MR, and moderate AI. At office visit in 07/2020, she continued to report intermittent chest pain generally relieved by sublingual Nitro and chronic dyspnea on exertion. Myoview was ordered but was a very poor study due to severe extracardiac tracer update in the gut making imaging essentially uninterpretable but there was a fixed defect in the anterior  apical wall and apex which goes along with her known anatomy. Dr. Ellyn Hack reviewed images and felt symptoms were probably due to coronary spasms and microvascular disease. Unfortunately, all of her disease is distal and not revascularizable. She was last seen by Dr. Ellyn Hack in 12/2020 at which time she continued to report off and on chest pain not always associated with activity. She was started on Amlodipine 2.5mg  daily in lieu of ARB.   Since last visit, she was seen in the ED on 03/12/2021 with chest pain. She was reportedly participating in a sleep study when she developed right sided chest pain at 6am. This was associated with shortness of breath and nausea. EKG showed no acute ischemic changes. High-sensitivity troponin negative x2. D-dimer negative. Therefore, she was felt to be stable for discharge with close follow-up with Cardiology.  Patient presents today for follow-up.  Here with her husband.  She continues to have intermittent chest pain that occurs randomly. She states it feels like a "sticking" sensation. Occurs 2-3 times per week. Not necessarily associated with exertion. Normally resolves after 1 dose of sublingual Nitro (sometimes requires 2 doses). She states this is stable and not getting any worse or occurring more frequenting. No significant shortness of breath. Stable 3 pillow orthopnea. No lower extremity edema. No weight gain - she has actually lot about 17 lbs since last office visit in the summer. She notes some dizziness in the morning after taking her morning medications but it does not last long and she states  she thinks it is getting better. No syncope. No abnormal bleeding. Biggest complaint today is persistent numbness on her right side which she has had since her stroke. She also reports right back/side pain which she states has been going on for several months. May be secondary to gait issues following stroke (she states she is walking differently). She has chronic constipation  which she takes Miralax for. No other GI symptoms.   Past Medical History:  Diagnosis Date   Acid reflux    Cocaine abuse ([place])    Coronary artery disease, occlusive 2017   2017 or 2018-PCI to LAD (mid-distal); -> anterior STEMI April 2021 -> LAD stent patent, however distal LAD at D3 occluded, only able to reduce to 70% stenosis.  Small caliber RPDA with 80% stenosis.   Heart attack ([place]) 04/2016   High cholesterol    Hypertension    Ischemic cardiomyopathy 11/2019   Tobacco abuse     Past Surgical History:  Procedure Laterality Date   CORONARY/GRAFT ACUTE MI REVASCULARIZATION N/A 11/23/2019   Procedure: CORONARY/GRAFT ACUTE MI REVASCULARIZATION;  Surgeon: Leonie Man, MD;  Location: [facility];  Service: Cardiovascular; unsuccessful PTCA of distal LAD 100%.  O able to restore only minimal flow with 70% stenosis   LEFT HEART CATH AND CORONARY ANGIOGRAPHY N/A 11/23/2019   Procedure: LEFT HEART CATH AND CORONARY ANGIOGRAPHY;  Surgeon: Leonie Man, MD;  Location: [facility];  Service: Cardiovascular; ANTERIOR STEMI: Widely patent mid to distal LAD stent, distal LAD is 100% stenosed associated with the third diagonal.  (Post PTCA - 70% residual but 100% D3).  RPDA tapers to a small vessel -80% stenosis.  Severely elevated LVEDP   NM MYOVIEW LTD  08/27/2020   (recurrent chest pain): Poor study due to gut attenuation.  EF ~50-55%.  No EKG changes.  MLSTLY FIXED LARGE/SEVRE perfusion defect in the basal-apical inferior, mid inferolateral & mid-apical anterior / apical walls: Findings are c/w known anatomy (apical LAD occlusion & PDA 80%, with gut attenuation enhancing inferior involvement -> but Echo with normal EF & no RWA - contradicts this finding.   TRANSTHORACIC ECHOCARDIOGRAM  11/24/2019   -post anterior STEMI: EF mildly reduced 45 to 50%.  GR 1 DD.  Elevated LAP.  No obvious heart WMA.  Moderate-severe AI with mild-moderate Aortic Sclerosis but no Stenosis.    TRANSTHORACIC ECHOCARDIOGRAM  03/23/2020   (stroke evaluation): EF improved to 55 to 60%.  Significant LVH-notable apical.  Consider cardiac MRI.  Moderate concentric LVH.  GR 1 DD.  Elevated LAP.  Mild to moderate Aortic Sclerosis but no Stenosis.  Moderate AI.     Current Medications: Current Meds  Medication Sig   acetaminophen (TYLENOL) 500 MG tablet Take 1,000 mg by mouth in the morning, at noon, and at bedtime.   carvedilol (COREG) 25 MG tablet Take 1 tablet (25 mg total) by mouth 2 (two) times daily with a meal.   CLARITIN 10 MG tablet Take 10 mg by mouth daily.   clopidogrel (PLAVIX) 75 MG tablet Take 1 tablet (75 mg total) by mouth daily.   cyclobenzaprine (FLEXERIL) 10 MG tablet Take 10 mg by mouth 3 (three) times daily as needed for muscle spasms.    doxepin (SINEQUAN) 50 MG capsule Take 50 mg by mouth in the morning, at noon, and at bedtime.   empagliflozin (JARDIANCE) 10 MG TABS tablet Take 1 tablet (10 mg total) by mouth daily before breakfast.   Evolocumab (REPATHA SURECLICK) [id number]  MG/ML SOAJ Inject 140 mg into the skin every 14 (fourteen) days. (Patient taking differently: Inject 140 mg into the skin every 21 ( twenty-one) days.)   gabapentin (NEURONTIN) 300 MG capsule Take 300 mg by mouth 3 (three) times daily.   HUMULIN R 100 UNIT/ML injection Inject 16 Units into the skin 2 (two) times daily before a meal.   ibuprofen (ADVIL) 800 MG tablet Take 800 mg by mouth 3 (three) times daily as needed.   insulin NPH-regular Human (70-30) 100 UNIT/ML injection Inject 40 Units into the skin 2 (two) times daily.   ipratropium (ATROVENT HFA) 17 MCG/ACT inhaler Inhale 2 puffs into the lungs every 6 (six) hours.   nicotine (NICODERM CQ - DOSED IN MG/24 HOURS) 14 mg/24hr patch Place 1 patch (14 mg total) onto the skin daily.   nitroGLYCERIN (NITROSTAT) 0.4 MG SL tablet Place 1 tablet (0.4 mg total) under the tongue as needed for chest pain.   pantoprazole (PROTONIX) 40 MG tablet Take 40 mg by  mouth 2 (two) times daily.    polyethylene glycol powder (GLYCOLAX/MIRALAX) powder Take 17 g by mouth daily as needed for moderate constipation.    QUEtiapine (SEROQUEL) 400 MG tablet Take 400 mg by mouth at bedtime.   [DISCONTINUED] isosorbide mononitrate (IMDUR) 60 MG 24 hr tablet Take 1 and 1/2 tablet ( total 90 mg)  by mouth daily .     Allergies:   Bee venom, Contrast media [iodinated diagnostic agents], and Tomato   Social History   Socioeconomic History   Marital status: Married    Spouse name: Not on file   Number of children: Not on file   Years of education: Not on file   Highest education level: Not on file  Occupational History   Not on file  Tobacco Use   Smoking status: Every Day    Packs/day: 1.50    Years: 20.00    Pack years: 30.00    Types: Cigarettes   Smokeless tobacco: Never  Vaping Use   Vaping Use: Never used  Substance and Sexual Activity   Alcohol use: Not Currently   Drug use: Yes    Types: "Crack" cocaine    Comment: last used yesterday. States " I do it everyday"    Sexual activity: Not on file  Other Topics Concern   Not on file  Social History Narrative   Not on file   Social Determinants of Health   Financial Resource Strain: Not on file  Food Insecurity: Not on file  Transportation Needs: Not on file  Physical Activity: Not on file  Stress: Not on file  Social Connections: Not on file     Family History: The patient's family history includes Diabetes in her father, maternal grandfather, maternal grandmother, and mother.  ROS:   Please see the history of present illness.     EKGs/Labs/Other Studies Reviewed:    The following studies were reviewed today:  Left Cardiac Catheterization 11/23/2019: Previously placed Mid LAD to Dist LAD stent (unknown type) is widely patent. Mid LAD lesion is 30% stenosed. CULPRIT LESION: Dist LAD-1 lesion is 100% stenosed with 100% stenosed side branch in 3rd Diag. Unsuccessful balloon  angioplasty was performed using a BALLOON SAPPHIRE 2.0X12 -unable to advance into the distal vessel. Post intervention, there is a 70% residual stenosis followed by Dist LAD-2 lesion is 99% stenosed. Post intervention, the side branch was remained at 100% residual stenosis. -------------------------- RPDA lesion is 80% stenosed-tapers into very small vessel.. LV end  diastolic pressure is severely elevated. Therefore no the ventriculogram was performed. There is no aortic valve stenosis.  Diagnostic Dominance: Right Intervention   _______________  Echocardiogram 03/23/2020: Impressions: 1. LVEF has improved since the prior study on 11/24/2019, from 45-50% to  55-60%. There is significant LVH predominantly in the apical segments, a  cardiac MRI should be considered to evaluate for possible infiltrative and  hypertrophic cardiomyopathies.   2. Left ventricular ejection fraction, by estimation, is 55 to 60%. The  left ventricle has normal function. The left ventricle has no regional  wall motion abnormalities. There is moderate concentric left ventricular  hypertrophy. Left ventricular  diastolic parameters are consistent with Grade I diastolic dysfunction  (impaired relaxation). Elevated left atrial pressure.   3. Right ventricular systolic function is normal. The right ventricular  size is normal.   4. The mitral valve is normal in structure. Mild mitral valve  regurgitation. No evidence of mitral stenosis.   5. The aortic valve is tricuspid. Aortic valve regurgitation is moderate.  Mild to moderate aortic valve sclerosis/calcification is present, without  any evidence of aortic stenosis.   6. The inferior vena cava is normal in size with greater than 50%  respiratory variability, suggesting right atrial pressure of 3 mmHg.  _______________  Myoview 08/27/2020: Very poor study due to severe extracardiac tracer uptake in the gut making imaging essentially uninterpretable. The left  ventricular ejection fraction is mildly decreased (45-54%). Nuclear stress EF: 53%. There was no ST segment deviation noted during stress. No T wave inversion was noted during stress. There is a large defect of severe severity present in the basal inferior, mid anterior, mid inferior, mid inferolateral, apical anterior, apical inferior and apex location possibly artifact due to significant extracardiac tracer uptake in the gut. Based on prior TTE, the EF was normal with no wall motion abnormalities, which does not correlate with perfusion on nuclear study. Recommend coronary CTA to better assess possiblity of obstructive CAD.  EKG:  EKG not ordered today.   Recent Labs: 03/12/2021: ALT 13; BUN 22; Creatinine, Ser 1.21; Hemoglobin 14.8; Platelets 175; Potassium 4.4; Sodium 133  Recent Lipid Panel    Component Value Date/Time   CHOL 108 01/26/2021 1210   TRIG 241 (H) 01/26/2021 1210   HDL 43 01/26/2021 1210   CHOLHDL 2.5 01/26/2021 1210   CHOLHDL 4.5 03/23/2020 1237   VLDL 39 03/23/2020 1237   LDLCALC 28 01/26/2021 1210    Physical Exam:    Vital Signs: BP 110/68 (BP Location: Left Arm, Patient Position: Sitting, Cuff Size: Normal)   Pulse 83   Resp 20   Ht 5\' 2"  (1.575 m)   Wt 161 lb 12.8 oz (73.4 kg)   LMP 06/11/2021 (Approximate)   SpO2 96%   BMI 29.59 kg/m     Wt Readings from Last 3 Encounters:  06/18/21 161 lb 12.8 oz (73.4 kg)  03/12/21 173 lb (78.5 kg)  03/11/21 173 lb (78.5 kg)     General: 50 y.o. African-American female in no acute distress. HEENT: Normocephalic and atraumatic. Sclera clear.  Neck: Supple. No carotid bruits. No JVD. Heart: RRR. Distinct S1 and S2. No murmurs, gallops, or rubs. Radial pulses 2+ and equal bilaterally. Lungs: No increased work of breathing. Clear to ausculation bilaterally. No wheezes, rhonchi, or rales.  Abdomen: Soft, non-distended, and non-tender to palpation.  MSK: Normal strength and tone for age.  Extremities: No lower  extremity edema.    Skin: Warm and dry. Neuro: Alert and oriented  x3. No focal deficits. Psych: Normal affect. Responds appropriately.  Assessment:    1. Coronary artery disease of native artery of native heart with stable angina pectoris ([place])   2. Ischemic cardiomyopathy   3. Primary hypertension   4. Hyperlipidemia, unspecified hyperlipidemia type   5. Type 2 diabetes mellitus with complication, with long-term current use of insulin ([place])   6. Cerebrovascular accident (CVA), unspecified mechanism ([place])   7. Tobacco abuse     Plan:    CAD with Chronic Chest Pain - S/p DES to LAD in 2018. History of anterior STEMI in 11/2019 - LHC showed patent LAD stent with 100% stenosis of distal LAD and 100% stenosis of side branch in 3rd Diag. PTCA was performed and reduced LAD lesion to 70% but was unable to cross 3rd Diag lesion. Also noted to have 80% stenosis of RPDA that was too small for PCI. Treated medically. Myoview in 08/2020 was very poor quality but did show fixed defect in anterior region. All disease is distal and not revascularizable. - Continues to have intermittent episodes of mild chest pain not necessarily related to exertion. Stable. -  Current antianginals: Amlodipine 2.5mg  daily, Coreg 25mg  twice daily, and Imdur 90mg  daily. Will increase Imdur to 90mg  in the morning and 30mg  in the evening. - Continue Plavix 75mg  daily. - Continue Lipitor 40mg  daily and Repatha.  - Discussed signs and symptoms of concerning chest pain and when to go to the ED.  Ischemic Cardiomyopathy - Echo in 03/2020 showed LVEF of 55-60% with significant LVH predominantly in the apical segments, grade 1 diastolic dysfunction, and mild MR, and moderate AI. - Euvolemic on exam.  - Continue Coreg 25mg  twice daily.  - Will increase Imdur to 90mg  in the morning and 20mg  in the evening as stated above for antianginal benefit. - Continue Jardiance 10mg  daily.  Hypertension - BP well controlled. - Continue  Amlodipine, Coreg, and Imdur as above.  Hyperlipidemia - Lipid panel in 12/2020: Total Cholesterol 108, Triglycerides 241, HDL 43, LDL 28.  - LDL at goal given history of CAD. - Continue Crestor 40mg  daily and Repatha.   Type 2 Diabetes Mellitus  - Management per PCP.  CVA - History of stroke in 03/2020. She reported persistent numbness of her right side since the stroke as well as recent right side pain which I am wondering if is from the way she has been walking following her stroke. Patient has a visit with her PCP scheduled for next week. Advised her to discuss these concerns at that time.   Tobacco Abuse - Patient still smoking by down from over 1 pack per day to 1/2 pack per day.  - Congratulated patient on progress made so far and encouraged her to continue to work towards complete cessation.  - Prescribed Nicotine patches at the request of patient. Discussed importance of not smoking while wearing the patchy.  Disposition: Follow up in 4-6 months with Dr. Ellyn Hack.   Medication Adjustments/Labs and Tests Ordered: Current medicines are reviewed at length with the patient today.  Concerns regarding medicines are outlined above.  No orders of the defined types were placed in this encounter.  Meds ordered this encounter  Medications   nicotine (NICODERM CQ - DOSED IN MG/24 HOURS) 14 mg/24hr patch    Sig: Place 1 patch (14 mg total) onto the skin daily.    Dispense:  28 patch    Refill:  0   isosorbide mononitrate (IMDUR) 60 MG 24 hr tablet  Sig: Take 1.5 tablets (90 mg total) by mouth every morning AND 0.5 tablets (30 mg total) at bedtime.    Dispense:  180 tablet    Refill:  3    Patient Instructions  Medication Instructions:  INCREASE Imdur to 90 mg (one and half tablets) in the mornings and 30 mg (half tablet) in the evenings  START Nicotine patches 14 mg to apply topically to skin. DO NOT smoke while wearing patches  *If you need a refill on your cardiac medications  before your next appointment, please call your pharmacy*  Lab Work: NONE ordered at this time of appointment   If you have labs (blood work) drawn today and your tests are completely normal, you will receive your results only by: [place] (if you have MyChart) OR A paper copy in the mail If you have any lab test that is abnormal or we need to change your treatment, we will call you to review the results.  Testing/Procedures: NONE ordered at this time of appointment   Follow-Up: At [facility], you and your health needs are our priority.  As part of our continuing mission to provide you with exceptional heart care, we have created designated Provider Care Teams.  These Care Teams include your primary Cardiologist (physician) and Advanced Practice Providers (APPs -  Physician Assistants and Nurse Practitioners) who all work together to provide you with the care you need, when you need it.  We recommend signing up for the patient portal called "MyChart".  Sign up information is provided on this After Visit Summary.  MyChart is used to connect with patients for Virtual Visits (Telemedicine).  Patients are able to view lab/test results, encounter notes, upcoming appointments, etc.  Non-urgent messages can be sent to your provider as well.   To learn more about what you can do with MyChart, go to [email or web address].    Your next appointment:   4-6 month(s)  The format for your next appointment:   In Person  Provider:   Glenetta Hew, MD    Other Instructions    Signed, Darreld Mclean, PA-C  06/18/2021 1:29 PM    [place]

## 2021-06-17 ENCOUNTER — Encounter: Payer: Self-pay | Admitting: Student

## 2021-06-18 ENCOUNTER — Encounter: Payer: Self-pay | Admitting: Student

## 2021-06-18 ENCOUNTER — Ambulatory Visit (INDEPENDENT_AMBULATORY_CARE_PROVIDER_SITE_OTHER): Payer: Self-pay | Admitting: Student

## 2021-06-18 ENCOUNTER — Other Ambulatory Visit: Payer: Self-pay

## 2021-06-18 VITALS — BP 110/68 | HR 83 | Resp 20 | Ht 62.0 in | Wt 161.8 lb

## 2021-06-18 DIAGNOSIS — Z720 Tobacco use: Secondary | ICD-10-CM

## 2021-06-18 DIAGNOSIS — E118 Type 2 diabetes mellitus with unspecified complications: Secondary | ICD-10-CM

## 2021-06-18 DIAGNOSIS — Z794 Long term (current) use of insulin: Secondary | ICD-10-CM

## 2021-06-18 DIAGNOSIS — E785 Hyperlipidemia, unspecified: Secondary | ICD-10-CM

## 2021-06-18 DIAGNOSIS — I10 Essential (primary) hypertension: Secondary | ICD-10-CM

## 2021-06-18 DIAGNOSIS — I639 Cerebral infarction, unspecified: Secondary | ICD-10-CM

## 2021-06-18 DIAGNOSIS — I25118 Atherosclerotic heart disease of native coronary artery with other forms of angina pectoris: Secondary | ICD-10-CM

## 2021-06-18 DIAGNOSIS — I255 Ischemic cardiomyopathy: Secondary | ICD-10-CM

## 2021-06-18 MED ORDER — ISOSORBIDE MONONITRATE ER 60 MG PO TB24: ORAL_TABLET | ORAL | 3 refills | Status: DC

## 2021-06-18 MED ORDER — NICOTINE 14 MG/24HR TD PT24: 14.0000 mg | MEDICATED_PATCH | Freq: Every day | TRANSDERMAL | 0 refills | Status: DC

## 2021-06-18 NOTE — Patient Instructions (Addendum)
Medication Instructions:  INCREASE Imdur to 90 mg (one and half tablets) in the mornings and 30 mg (half tablet) in the evenings  START Nicotine patches 14 mg to apply topically to skin. DO NOT smoke while wearing patches  *If you need a refill on your cardiac medications before your next appointment, please call your pharmacy*  Lab Work: NONE ordered at this time of appointment   If you have labs (blood work) drawn today and your tests are completely normal, you will receive your results only by: MyChart Message (if you have MyChart) OR A paper copy in the mail If you have any lab test that is abnormal or we need to change your treatment, we will call you to review the results.  Testing/Procedures: NONE ordered at this time of appointment   Follow-Up: At [facility], you and your health needs are our priority.  As part of our continuing mission to provide you with exceptional heart care, we have created designated Provider Care Teams.  These Care Teams include your primary Cardiologist (physician) and Advanced Practice Providers (APPs -  Physician Assistants and Nurse Practitioners) who all work together to provide you with the care you need, when you need it.  We recommend signing up for the patient portal called "MyChart".  Sign up information is provided on this After Visit Summary.  MyChart is used to connect with patients for Virtual Visits (Telemedicine).  Patients are able to view lab/test results, encounter notes, upcoming appointments, etc.  Non-urgent messages can be sent to your provider as well.   To learn more about what you can do with MyChart, go to [email or web address].    Your next appointment:   4-6 month(s)  The format for your next appointment:   In Person  Provider:   Bryan Lemma, MD    Other Instructions

## 2021-06-19 ENCOUNTER — Telehealth: Payer: Self-pay | Admitting: Cardiology

## 2021-06-19 NOTE — Telephone Encounter (Signed)
[facility] called and said that [facility] not [facility] any Nicotine Packages. A prescription was sent there.

## 2021-06-19 NOTE — Telephone Encounter (Signed)
Spoke with pt's husband. He was told since the [facility] does not have the patches they could have the prescription transferred to another pharmacy. The pt was in the background and states she does not have the money to get the patches. Pt's husband was told "I do not think we have samples in office." He responded "You THINK?" Pt placed on hold to allow me to look in the sample closet. He was made aware we do not have samples in office but he could call 1-800-QUIT-NOW to inquire about free patches. He verbalized understanding. No further questions at this time.

## 2021-07-16 ENCOUNTER — Telehealth: Payer: Self-pay

## 2021-07-16 NOTE — Telephone Encounter (Signed)
Called and spoke w/pt and stated that they were approved to receive the repatha free for the upcoming year and I told them to call [employer] to schedule deliveries. The pt voiced understanding.

## 2021-07-20 ENCOUNTER — Other Ambulatory Visit: Payer: Self-pay

## 2021-07-20 MED ORDER — REPATHA SURECLICK 140 MG/ML ~~LOC~~ SOAJ: 140.0000 mg | SUBCUTANEOUS | 3 refills | Status: DC

## 2021-08-17 ENCOUNTER — Encounter (HOSPITAL_COMMUNITY): Payer: Self-pay | Admitting: Emergency Medicine

## 2021-08-17 ENCOUNTER — Other Ambulatory Visit: Payer: Self-pay

## 2021-08-17 ENCOUNTER — Emergency Department (HOSPITAL_COMMUNITY): Payer: Medicaid Other

## 2021-08-17 ENCOUNTER — Emergency Department (HOSPITAL_COMMUNITY)
Admission: EM | Admit: 2021-08-17 | Discharge: 2021-08-17 | Disposition: A | Discharge status: Home / Self Care | Payer: Medicaid Other | Attending: Student | Admitting: Student

## 2021-08-17 DIAGNOSIS — Z794 Long term (current) use of insulin: Secondary | ICD-10-CM | POA: Insufficient documentation

## 2021-08-17 DIAGNOSIS — F17210 Nicotine dependence, cigarettes, uncomplicated: Secondary | ICD-10-CM | POA: Insufficient documentation

## 2021-08-17 DIAGNOSIS — I509 Heart failure, unspecified: Secondary | ICD-10-CM | POA: Insufficient documentation

## 2021-08-17 DIAGNOSIS — K047 Periapical abscess without sinus: Secondary | ICD-10-CM | POA: Diagnosis not present

## 2021-08-17 DIAGNOSIS — R220 Localized swelling, mass and lump, head: Secondary | ICD-10-CM | POA: Diagnosis present

## 2021-08-17 DIAGNOSIS — E119 Type 2 diabetes mellitus without complications: Secondary | ICD-10-CM | POA: Insufficient documentation

## 2021-08-17 DIAGNOSIS — Z79899 Other long term (current) drug therapy: Secondary | ICD-10-CM | POA: Diagnosis not present

## 2021-08-17 DIAGNOSIS — I110 Hypertensive heart disease with heart failure: Secondary | ICD-10-CM | POA: Diagnosis not present

## 2021-08-17 DIAGNOSIS — Z7984 Long term (current) use of oral hypoglycemic drugs: Secondary | ICD-10-CM | POA: Insufficient documentation

## 2021-08-17 DIAGNOSIS — Z7902 Long term (current) use of antithrombotics/antiplatelets: Secondary | ICD-10-CM | POA: Insufficient documentation

## 2021-08-17 DIAGNOSIS — I2510 Atherosclerotic heart disease of native coronary artery without angina pectoris: Secondary | ICD-10-CM | POA: Diagnosis not present

## 2021-08-17 IMAGING — CT CT MAXILLOFACIAL W/O CM
3 series · 15 of 47 positions shown, 18 images · non-contrast
Comparison: [DATE]

CLINICAL DATA: Maxillary/facial abscess. Contrast allergy. Facial
swelling on the RIGHT jaw. Broken tooth on the LEFT.



[Series 3: max soft · axial · 0.34mm/px · z∈[+1107,+1237]mm · 9 of 77 slices shown, 12 images]
[im 6/77  brain]
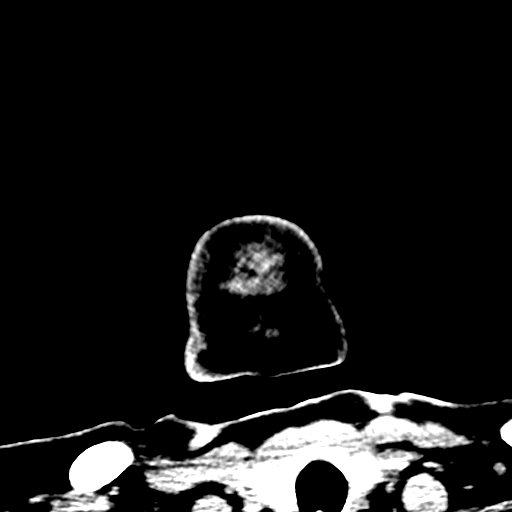
[im 6/77  bone]
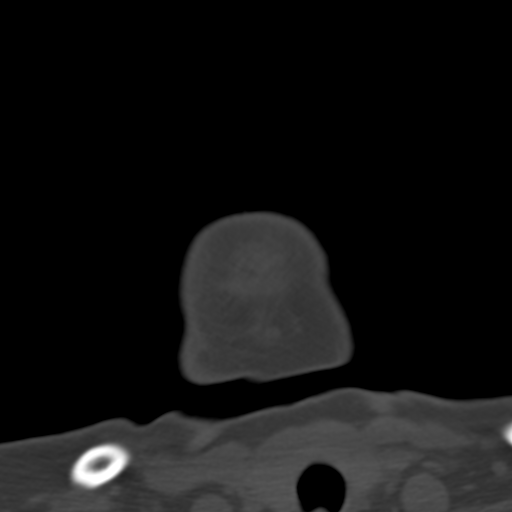
[im 14/77  bone]
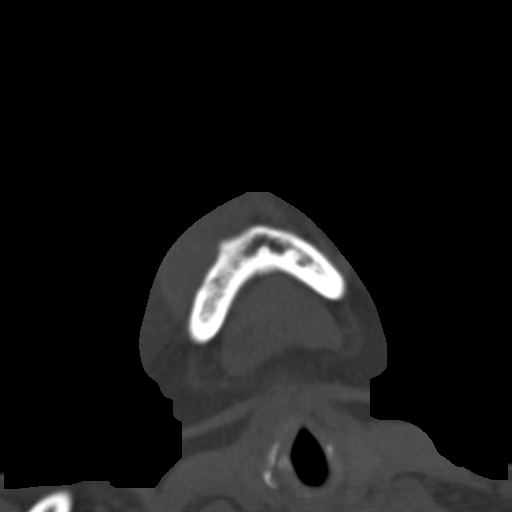
[im 21/77  bone]
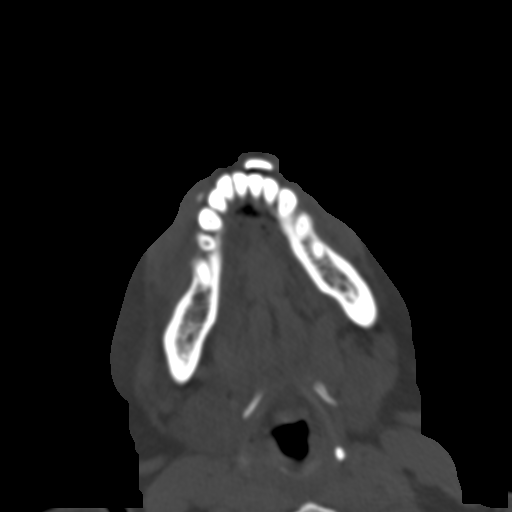
[im 29/77  bone]
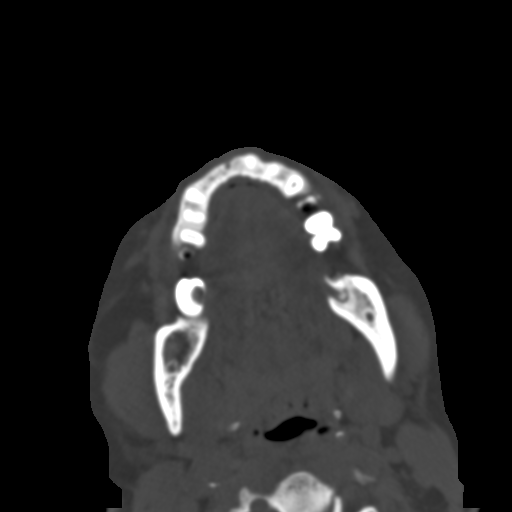
[im 40/77  brain]
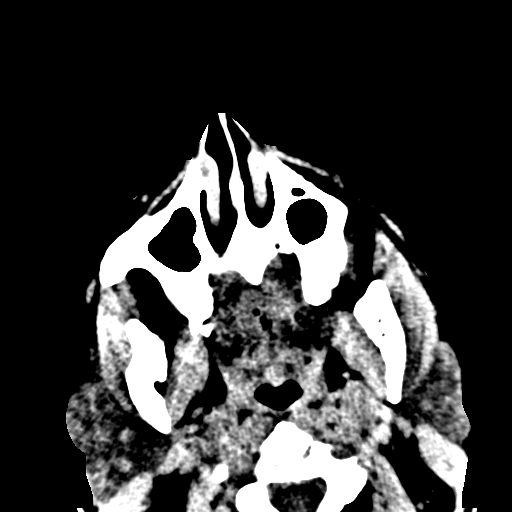
[im 40/77  bone]
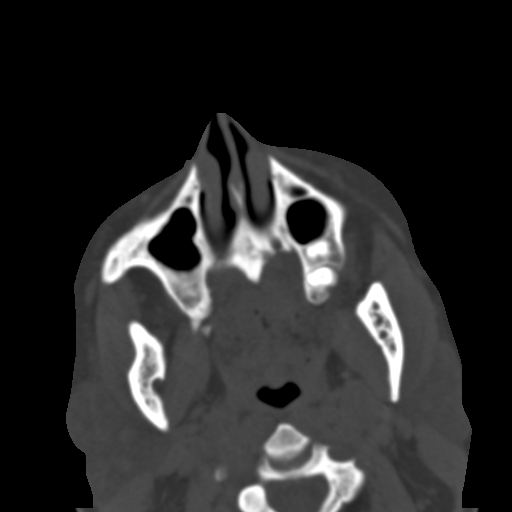
[im 48/77  bone]
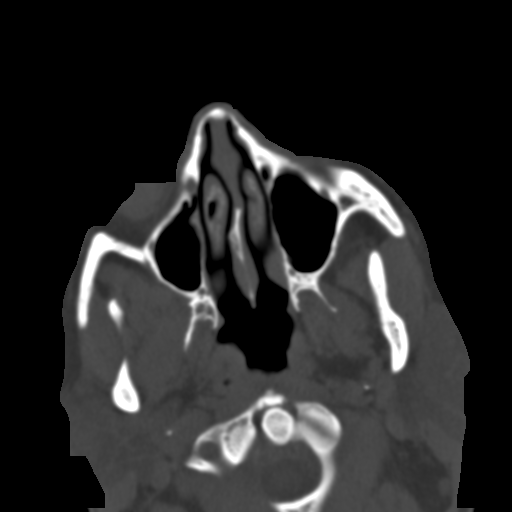
[im 56/77  bone]
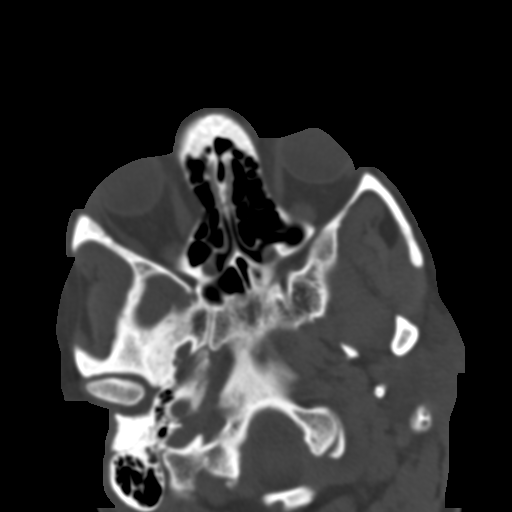
[im 63/77  bone]
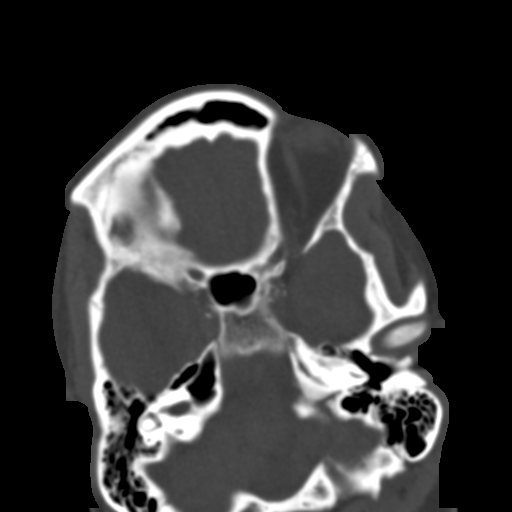
[im 71/77  brain]
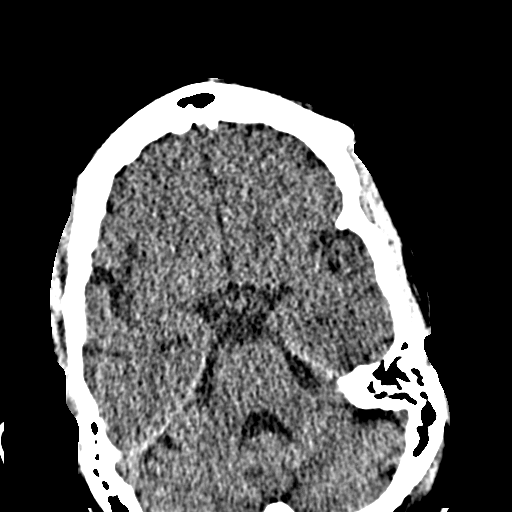
[im 71/77  bone]
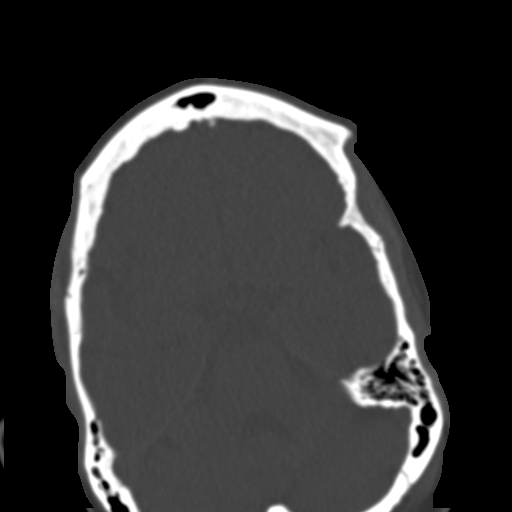

[Series 7: coronal soft · coronal · 0.35mm/px · 3 of 75 slices shown]
[im 25/75  bone]
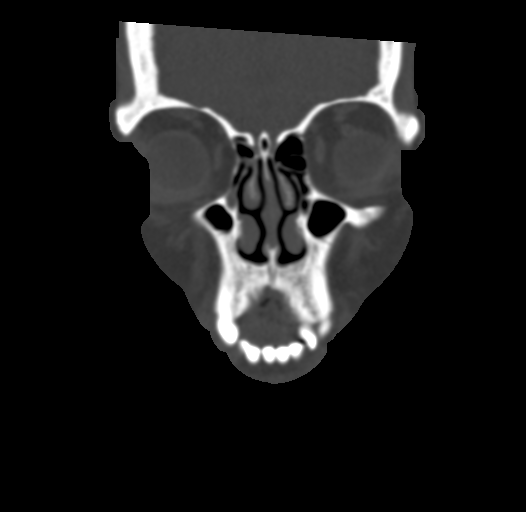
[im 33/75  bone]
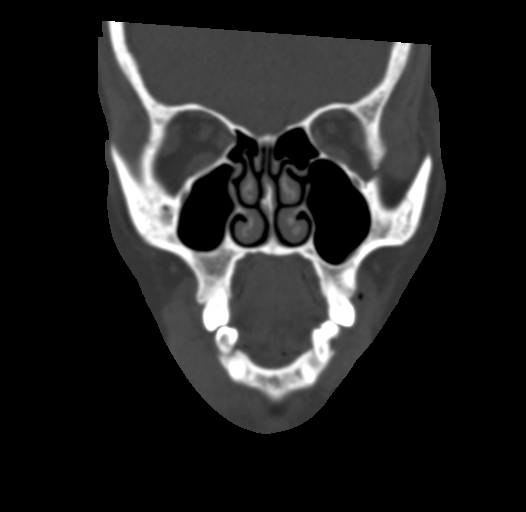
[im 42/75  bone]
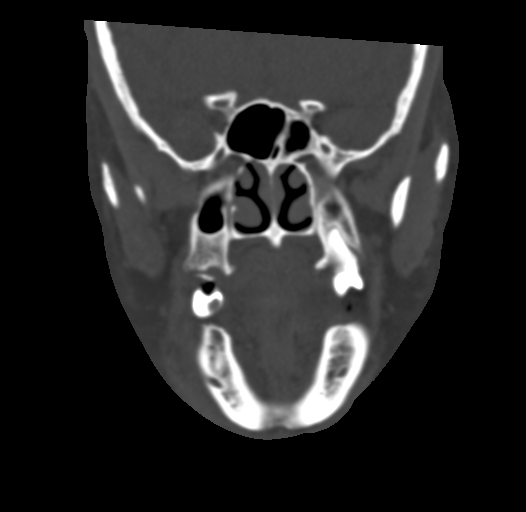

[Series 9: sagittal soft · sagittal · 0.29mm/px · 3 of 93 slices shown]
[im 31/93  bone]
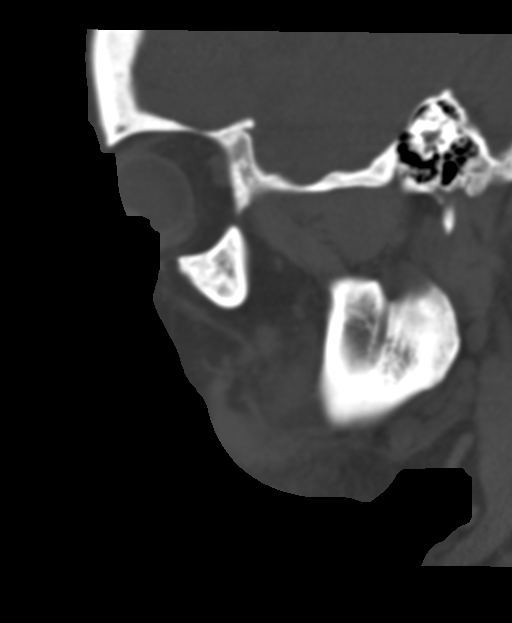
[im 47/93  bone]
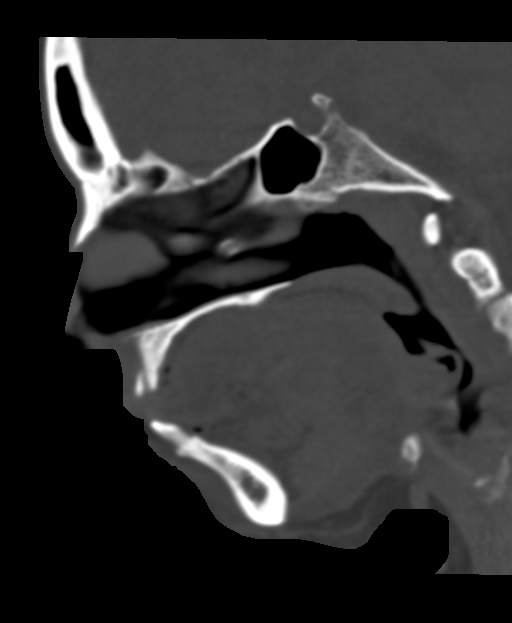
[im 62/93  bone]
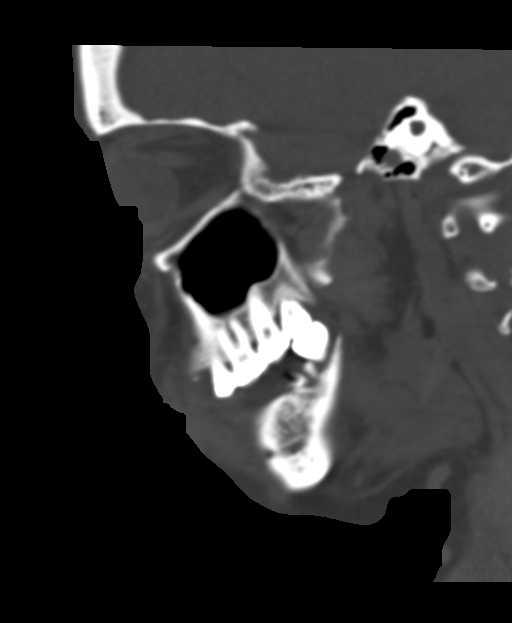

[15 of 47 positions shown; findings below may reference images not displayed]

FINDINGS: Osseous: Mandible and maxilla are intact. Extensive caries and
multiple periapical abscesses. There are extensive caries and
periapical abscess of tooth 29, which is immediately adjacent to the
soft tissue mass described below, and the likely cause of the
inflammation. There are numerous dental fragments, including molars
in the LEFT mandible and RIGHT maxilla.

Orbits: Intact.

Sinuses: Clear.

Soft tissues: There is stranding of the subcutaneous fat in the
LOWER aspect of the RIGHT face. Adjacent to the anterior aspect of
the RIGHT side of the mandible, there is a heterogeneous, indistinct
soft tissue mass, likely representing abscess or edema. This
measures 1.5 x 3.6 x 3.8 centimeters (image 37 of series 7, image 65
of series 3). Given the lack of intravenous contrast, is difficult
to characterize this as abscess versus phlegmon/edema. No discrete
low-attenuation noted centrally to indicate abscess in the absence
of intravenous contrast.

Limited intracranial: No significant or unexpected finding.
IMPRESSION: 1. Extensive caries, periapical abscesses, and dental fragments.
2. 3.8 centimeter soft tissue density adjacent to the anterior RIGHT
aspect of the mandible, consistent with inflammation. These changes
cannot be characterized as abscess given the lack of intravenous
contrast. Considerations include phlegmon/edema, or abscess. These
soft tissue changes are likely result of extensive caries,
periapical abscess of tooth #29.

## 2021-08-17 MED ORDER — AMOXICILLIN-POT CLAVULANATE 875-125 MG PO TABS
1.0000 | ORAL_TABLET | Freq: Two times a day (BID) | ORAL | 0 refills | Status: DC
  Filled 2021-08-17: qty 14, 7d supply, fill #0

## 2021-08-17 MED ORDER — SODIUM CHLORIDE 0.9 % IV SOLN
3.0000 g | Freq: Once | INTRAVENOUS | Status: AC
  Administered 2021-08-17: 3 g via INTRAVENOUS
  Filled 2021-08-17: qty 8

## 2021-08-17 MED ORDER — HYDROCODONE-ACETAMINOPHEN 5-325 MG PO TABS
1.0000 | ORAL_TABLET | Freq: Once | ORAL | Status: AC
  Administered 2021-08-17: 1 via ORAL
  Filled 2021-08-17: qty 1

## 2021-08-17 MED ORDER — DEXAMETHASONE SODIUM PHOSPHATE 10 MG/ML IJ SOLN
8.0000 mg | Freq: Once | INTRAMUSCULAR | Status: AC
  Administered 2021-08-17: 8 mg via INTRAVENOUS
  Filled 2021-08-17: qty 1

## 2021-08-17 MED ORDER — KETOROLAC TROMETHAMINE 15 MG/ML IJ SOLN
15.0000 mg | Freq: Once | INTRAMUSCULAR | Status: AC
  Administered 2021-08-17: 15 mg via INTRAVENOUS
  Filled 2021-08-17: qty 1

## 2021-08-17 MED ORDER — AMOXICILLIN-POT CLAVULANATE 875-125 MG PO TABS: 1.0000 | ORAL_TABLET | Freq: Two times a day (BID) | ORAL | 0 refills | Status: DC

## 2021-08-17 MED ORDER — NAPROXEN 375 MG PO TABS: 375.0000 mg | ORAL_TABLET | Freq: Two times a day (BID) | ORAL | 0 refills | Status: DC

## 2021-08-17 NOTE — ED Provider Notes (Addendum)
[facility]-EMERGENCY DEPT Provider Note  CSN: [id number] Arrival date & time: 08/17/21 1350  Chief Complaint(s) Facial Swelling  HPI Maria Harper is a 51 y.o. female with PMH cocaine abuse, ischemic cardiomyopathy and MI who presents the emergency department for evaluation of facial swelling.  Patient states that she initially had a broken tooth and swelling around tooth #23 over the last 3 days.  However that swelling has since receded and now the swelling is worse around tooth #27 and she is having trouble opening her mouth.  She denies chest pain, shortness of breath, fever, cough, vomiting or other systemic symptoms.  HPI  Past Medical History Past Medical History:  Diagnosis Date   Acid reflux    Cocaine abuse (HCC)    Coronary artery disease, occlusive 2017   2017 or 2018-PCI to LAD (mid-distal); -> anterior STEMI April 2021 -> LAD stent patent, however distal LAD at D3 occluded, only able to reduce to 70% stenosis.  Small caliber RPDA with 80% stenosis.   Heart attack (HCC) 04/2016   High cholesterol    Hypertension    Ischemic cardiomyopathy 11/2019   Tobacco abuse    Patient Active Problem List   Diagnosis Date Noted   Chronic heart failure with preserved ejection fraction (HFpEF) (HCC) 02/01/2021   History of CVA (cerebrovascular accident) 03/23/2020   Essential hypertension 11/28/2019   Hyperlipidemia    Tobacco abuse    Type 2 diabetes mellitus with complication, with long-term current use of insulin (HCC) 11/25/2019   History of ST elevation myocardial infarction (STEMI) 11/23/2019   CAD (coronary artery disease) 11/23/2019   Ischemic cardiomyopathy 11/2019   Cocaine abuse with cocaine-induced mood disorder (HCC) 06/10/2017   Home Medication(s) Prior to Admission medications   Medication Sig Start Date End Date Taking? Authorizing Provider  naproxen (NAPROSYN) 375 MG tablet Take 1 tablet (375 mg total) by mouth 2 (two) times daily.  08/17/21  Yes Hadassa Cermak, MD  acetaminophen (TYLENOL) 500 MG tablet Take 1,000 mg by mouth in the morning, at noon, and at bedtime. 09/23/20   [provider]  amLODipine (NORVASC) 2.5 MG tablet Take 1 tablet (2.5 mg total) by mouth daily. 01/26/21 04/26/21  [name], MD  amoxicillin-clavulanate (AUGMENTIN) 875-125 MG tablet Take 1 tablet by mouth every 12 (twelve) hours. 08/17/21   Grover Robinson, MD  atorvastatin (LIPITOR) 40 MG tablet Take 1 tablet (40 mg total) by mouth daily. 02/05/21 05/06/21  [name], MD  carvedilol (COREG) 25 MG tablet Take 1 tablet (25 mg total) by mouth 2 (two) times daily with a meal. 07/15/20   Kroeger, Dot Lanes M., PA-C  CLARITIN 10 MG tablet Take 10 mg by mouth daily. 01/13/21   [provider]  clopidogrel (PLAVIX) 75 MG tablet Take 1 tablet (75 mg total) by mouth daily. 01/26/21   [name], MD  cyclobenzaprine (FLEXERIL) 10 MG tablet Take 10 mg by mouth 3 (three) times daily as needed for muscle spasms.     [provider]  doxepin (SINEQUAN) 50 MG capsule Take 50 mg by mouth in the morning, at noon, and at bedtime.    [provider]  empagliflozin (JARDIANCE) 10 MG TABS tablet Take 1 tablet (10 mg total) by mouth daily before breakfast. 07/15/20   Kroeger, Ovidio Kin., PA-C  Evolocumab (REPATHA SURECLICK) 140 MG/ML SOAJ Inject 140 mg into the skin every 14 (fourteen) days. 07/20/21   [name], MD  gabapentin (NEURONTIN) 300 MG  capsule Take 300 mg by mouth 3 (three) times daily.    [provider]  HUMULIN R 100 UNIT/ML injection Inject 16 Units into the skin 2 (two) times daily before a meal. 06/13/18   [provider]  ibuprofen (ADVIL) 800 MG tablet Take 800 mg by mouth 3 (three) times daily as needed. 01/20/21   [provider]  insulin NPH-regular Human (70-30) 100 UNIT/ML injection Inject 40 Units into the skin 2 (two) times daily. 07/01/17   [provider]   ipratropium (ATROVENT HFA) 17 MCG/ACT inhaler Inhale 2 puffs into the lungs every 6 (six) hours.    [provider]  isosorbide mononitrate (IMDUR) 60 MG 24 hr tablet Take 1.5 tablets (90 mg total) by mouth every morning AND 0.5 tablets (30 mg total) at bedtime. 06/18/21   Corrin Parker, PA-C  nicotine (NICODERM CQ - DOSED IN MG/24 HOURS) 14 mg/24hr patch Place 1 patch (14 mg total) onto the skin daily. 06/18/21   Corrin Parker, PA-C  nitroGLYCERIN (NITROSTAT) 0.4 MG SL tablet Place 1 tablet (0.4 mg total) under the tongue as needed for chest pain. 07/15/20   Kroeger, Ovidio Kin., PA-C  pantoprazole (PROTONIX) 40 MG tablet Take 40 mg by mouth 2 (two) times daily.     [provider]  polyethylene glycol powder (GLYCOLAX/MIRALAX) powder Take 17 g by mouth daily as needed for moderate constipation.  04/05/18   [provider]  QUEtiapine (SEROQUEL) 400 MG tablet Take 400 mg by mouth at bedtime. 04/05/18   [provider]                                                                                                                                    Past Surgical History Past Surgical History:  Procedure Laterality Date   CORONARY/GRAFT ACUTE MI REVASCULARIZATION N/A 11/23/2019   Procedure: CORONARY/GRAFT ACUTE MI REVASCULARIZATION;  Surgeon: [name], MD;  Location: [facility] INVASIVE CV LAB;  Service: Cardiovascular; unsuccessful PTCA of distal LAD 100%.  O able to restore only minimal flow with 70% stenosis   LEFT HEART CATH AND CORONARY ANGIOGRAPHY N/A 11/23/2019   Procedure: LEFT HEART CATH AND CORONARY ANGIOGRAPHY;  Surgeon: [name], MD;  Location: [facility] INVASIVE CV LAB;  Service: Cardiovascular; ANTERIOR STEMI: Widely patent mid to distal LAD stent, distal LAD is 100% stenosed associated with the third diagonal.  (Post PTCA - 70% residual but 100% D3).  RPDA tapers to a small vessel -80% stenosis.  Severely elevated LVEDP   NM MYOVIEW LTD   08/27/2020   (recurrent chest pain): Poor study due to gut attenuation.  EF ~50-55%.  No EKG changes.  MLSTLY FIXED LARGE/SEVRE perfusion defect in the basal-apical inferior, mid inferolateral & mid-apical anterior / apical walls: Findings are c/w known anatomy (apical LAD occlusion & PDA 80%, with gut attenuation enhancing inferior involvement -> but Echo with normal EF & no  RWA - contradicts this finding.   TRANSTHORACIC ECHOCARDIOGRAM  11/24/2019   -post anterior STEMI: EF mildly reduced 45 to 50%.  GR 1 DD.  Elevated LAP.  No obvious heart WMA.  Moderate-severe AI with mild-moderate Aortic Sclerosis but no Stenosis.   TRANSTHORACIC ECHOCARDIOGRAM  03/23/2020   (stroke evaluation): EF improved to 55 to 60%.  Significant LVH-notable apical.  Consider cardiac MRI.  Moderate concentric LVH.  GR 1 DD.  Elevated LAP.  Mild to moderate Aortic Sclerosis but no Stenosis.  Moderate AI.    Family History Family History  Problem Relation Age of Onset   Diabetes Mother    Diabetes Father    Diabetes Maternal Grandmother    Diabetes Maternal Grandfather     Social History Social History   Tobacco Use   Smoking status: Every Day    Packs/day: 1.50    Years: 20.00    Pack years: 30.00    Types: Cigarettes   Smokeless tobacco: Never  Vaping Use   Vaping Use: Never used  Substance Use Topics   Alcohol use: Not Currently   Drug use: Yes    Types: "Crack" cocaine    Comment: last used yesterday. States " I do it everyday"    Allergies Bee venom, Contrast media [iodinated contrast media], and Tomato  Review of Systems Review of Systems  HENT:  Positive for dental problem and facial swelling.    Physical Exam Vital Signs  I have reviewed the triage vital signs BP 127/89    Pulse 94    Temp 98 F (36.7 C) (Oral)    Resp 18    LMP 08/10/2021    SpO2 100%   Physical Exam Vitals and nursing note reviewed.  Constitutional:      General: She is not in acute distress.    Appearance: She is  well-developed.  HENT:     Head: Normocephalic and atraumatic.     Comments: Multiple dental caries, gingival irritation around tooth #27, submental swelling but greater on the right Eyes:     Conjunctiva/sclera: Conjunctivae normal.  Cardiovascular:     Rate and Rhythm: Normal rate and regular rhythm.     Heart sounds: No murmur heard. Pulmonary:     Effort: Pulmonary effort is normal. No respiratory distress.     Breath sounds: Normal breath sounds.  Abdominal:     Palpations: Abdomen is soft.     Tenderness: There is no abdominal tenderness.  Musculoskeletal:        General: No swelling.     Cervical back: Neck supple.  Skin:    General: Skin is warm and dry.     Capillary Refill: Capillary refill takes less than 2 seconds.  Neurological:     Mental Status: She is alert.  Psychiatric:        Mood and Affect: Mood normal.    ED Results and Treatments Labs (all labs ordered are listed, but only abnormal results are displayed) Labs Reviewed - No data to display  Radiology CT Maxillofacial Wo Contrast  Result Date: 08/17/2021 CLINICAL DATA:  Maxillary/facial abscess. Contrast allergy. Facial swelling on the RIGHT jaw. Broken tooth on the LEFT. EXAM: CT MAXILLOFACIAL WITHOUT CONTRAST TECHNIQUE: Multidetector CT imaging of the maxillofacial structures was performed. Multiplanar CT image reconstructions were also generated. RADIATION DOSE REDUCTION: This exam was performed according to the departmental dose-optimization program which includes automated exposure control, adjustment of the mA and/or kV according to patient size and/or use of iterative reconstruction technique. COMPARISON:  02/20/2018 FINDINGS: Osseous: Mandible and maxilla are intact. Extensive caries and multiple periapical abscesses. There are extensive caries and periapical abscess of tooth 29, which  is immediately adjacent to the soft tissue mass described below, and the likely cause of the inflammation. There are numerous dental fragments, including molars in the LEFT mandible and RIGHT maxilla. Orbits: Intact. Sinuses: Clear. Soft tissues: There is stranding of the subcutaneous fat in the LOWER aspect of the RIGHT face. Adjacent to the anterior aspect of the RIGHT side of the mandible, there is a heterogeneous, indistinct soft tissue mass, likely representing abscess or edema. This measures 1.5 x 3.6 x 3.8 centimeters (image 37 of series 7, image 65 of series 3). Given the lack of intravenous contrast, is difficult to characterize this as abscess versus phlegmon/edema. No discrete low-attenuation noted centrally to indicate abscess in the absence of intravenous contrast. Limited intracranial: No significant or unexpected finding. IMPRESSION: 1. Extensive caries, periapical abscesses, and dental fragments. 2. 3.8 centimeter soft tissue density adjacent to the anterior RIGHT aspect of the mandible, consistent with inflammation. These changes cannot be characterized as abscess given the lack of intravenous contrast. Considerations include phlegmon/edema, or abscess. These soft tissue changes are likely result of extensive caries, periapical abscess of tooth #29. Electronically Signed   By: Norva Pavlov M.D.   On: 08/17/2021 15:19    Pertinent labs & imaging results that were available during my care of the patient were reviewed by me and considered in my medical decision making (see MDM for details).  Medications Ordered in ED Medications  Ampicillin-Sulbactam (UNASYN) 3 g in sodium chloride 0.9 % 100 mL IVPB (3 g Intravenous New Bag/Given 08/17/21 1436)  dexamethasone (DECADRON) injection 8 mg (8 mg Intravenous Given 08/17/21 1434)  ketorolac (TORADOL) 15 MG/ML injection 15 mg (15 mg Intravenous Given 08/17/21 1434)                                                                                                                                      Procedures .Marland KitchenIncision and Drainage  Date/Time: 08/17/2021 3:50 PM Performed by: Glendora Score, MD Authorized by: Glendora Score, MD   Location:    Type:  Abscess   Location: gingiva. Sedation:    Sedation type:  None Anesthesia:    Anesthesia method:  None Procedure type:    Complexity:  Simple Procedure details:    Incision depth:  Dermal  Drainage:  Purulent   Drainage amount:  Moderate   Wound treatment:  Wound left open Post-procedure details:    Procedure completion:  Tolerated well, no immediate complications  (including critical care time)  Medical Decision Making / ED Course   This patient presents to the ED for concern of facial swelling, this involves an extensive number of treatment options, and is a complaint that carries with it a high risk of complications and morbidity.  The differential diagnosis includes periapical abscess, submandibular abscess, Ludwick's angina, dental carry, pulpitis  MDM: Patient seen emergency department for evaluation of a facial swelling.  Physical exam reveals poor dentition and gingival irritation around tooth #29 with submental swelling but worse on the right.  Patient started on Unasyn for suspected dental infection and Toradol and dexamethasone were given due to the patient's trismus with the hopes that this will improve to allow the patient to take oral antibiotics.  CT max face without contrast obtained due to the patient's contrast allergy and this read showing of periapical abscess at tooth #29 which I drained at bedside.  CT max face also showing multiple dental caries and facial swelling that is 3.8 adjacent to the right mandible near the visible periapical abscess at tooth 29.  On reevaluation, trismus improved patient is tolerating p.o. without difficulty here.  She was discharged on Augmentin and Naprosyn and will follow-up with an emergency dental clinic tomorrow.  Additional  history obtained: -Additional history obtained from partner -External records from outside source obtained and reviewed including: Chart review including previous notes, labs, imaging, consultation notes   Lab Tests: -I ordered, reviewed, and interpreted labs.   The pertinent results include:   Labs Reviewed - No data to display   Imaging Studies ordered: I ordered imaging studies including CT max face I independently visualized and interpreted imaging. I agree with the radiologist interpretation   Medicines ordered and prescription drug management: Meds ordered this encounter  Medications   Ampicillin-Sulbactam (UNASYN) 3 g in sodium chloride 0.9 % 100 mL IVPB    Order Specific Question:   Antibiotic Indication:    Answer:   Other Indication (list below)    Order Specific Question:   Other Indication:    Answer:   dental infection   dexamethasone (DECADRON) injection 8 mg   ketorolac (TORADOL) 15 MG/ML injection 15 mg   DISCONTD: amoxicillin-clavulanate (AUGMENTIN) 875-125 MG tablet    Sig: Take 1 tablet by mouth every 12 (twelve) hours.    Dispense:  14 tablet    Refill:  0   amoxicillin-clavulanate (AUGMENTIN) 875-125 MG tablet    Sig: Take 1 tablet by mouth every 12 (twelve) hours.    Dispense:  14 tablet    Refill:  0   naproxen (NAPROSYN) 375 MG tablet    Sig: Take 1 tablet (375 mg total) by mouth 2 (two) times daily.    Dispense:  20 tablet    Refill:  0    -I have reviewed the patients home medicines and have made adjustments as needed  Critical interventions none   Cardiac Monitoring: The patient was maintained on a cardiac monitor.  I personally viewed and interpreted the cardiac monitored which showed an underlying rhythm of: NSR  Social Determinants of Health:  Factors impacting patients care include: none   Reevaluation: After the interventions noted above, I reevaluated the patient and found that they have :stayed the same  Co morbidities that  complicate the patient evaluation  Past Medical History:  Diagnosis Date   Acid reflux    Cocaine abuse (HCC)    Coronary artery disease, occlusive 2017   2017 or 2018-PCI to LAD (mid-distal); -> anterior STEMI April 2021 -> LAD stent patent, however distal LAD at D3 occluded, only able to reduce to 70% stenosis.  Small caliber RPDA with 80% stenosis.   Heart attack (HCC) 04/2016   High cholesterol    Hypertension    Ischemic cardiomyopathy 11/2019   Tobacco abuse       Dispostion: dc     Final Clinical Impression(s) / ED Diagnoses Final diagnoses:  Dental abscess     @    Glendora Score, MD 08/17/21 1518    Glendora Score, MD 08/17/21 1554

## 2021-08-17 NOTE — ED Triage Notes (Signed)
Patient presents due to facial swelling. Swelling is seen on the right jaw. She believes this was caused by a broken tooth on the left and the swelling spread to the right.

## 2021-08-24 ENCOUNTER — Encounter: Payer: Self-pay | Admitting: Internal Medicine

## 2021-08-27 ENCOUNTER — Other Ambulatory Visit: Payer: Self-pay | Admitting: *Deleted

## 2021-08-27 ENCOUNTER — Encounter: Payer: Self-pay | Admitting: *Deleted

## 2021-08-31 ENCOUNTER — Ambulatory Visit (INDEPENDENT_AMBULATORY_CARE_PROVIDER_SITE_OTHER): Payer: Medicaid Other | Admitting: Diagnostic Neuroimaging

## 2021-08-31 ENCOUNTER — Encounter: Payer: Self-pay | Admitting: Diagnostic Neuroimaging

## 2021-08-31 VITALS — BP 124/76 | HR 84 | Ht 62.0 in | Wt 155.5 lb

## 2021-08-31 DIAGNOSIS — I6381 Other cerebral infarction due to occlusion or stenosis of small artery: Secondary | ICD-10-CM | POA: Diagnosis not present

## 2021-08-31 DIAGNOSIS — G890 Central pain syndrome: Secondary | ICD-10-CM

## 2021-08-31 MED ORDER — GABAPENTIN 300 MG PO CAPS: 900.0000 mg | ORAL_CAPSULE | Freq: Three times a day (TID) | ORAL | 12 refills | Status: DC

## 2021-08-31 NOTE — Progress Notes (Signed)
[facility]  PATIENT: Maria Harper DOB: 02/14/1971  REFERRING CLINICIAN: Placey, Audrea Muscat, NP HISTORY FROM: patient  REASON FOR VISIT: new consult    HISTORICAL  CHIEF COMPLAINT:  Chief Complaint  Patient presents with   New Patient (Initial Visit)    Rm 6 with husband ( Will) pt reports she is here to discuss worsening neuropathy sx along with leg weakness. Pt reports sx started after stroke in August of 2022. Reports 3 falls over the last 30 days.     HISTORY OF PRESENT ILLNESS:   51 year old female here for evaluation of right-sided numbness.  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  She has been on gabapentin 300 mg 3 times a day without relief.  This was increased to 600 mg 3 times a day 1 week ago, which patient took for few days and now has stopped.  Patient did not feel any benefit from gabapentin.   REVIEW OF SYSTEMS: Full 14 system review of systems performed and negative with exception of: as per HPI.  ALLERGIES: Allergies  Allergen Reactions   Bee Venom Anaphylaxis   Contrast Media [Iodinated Contrast Media] Hives and Itching   Tomato Itching and Rash    HOME MEDICATIONS: Outpatient Medications Prior to Visit  Medication Sig Dispense Refill   acetaminophen (TYLENOL) 500 MG tablet Take 1,000 mg by mouth in the morning, at noon, and at bedtime.     atorvastatin (LIPITOR) 80 MG tablet Take 1 tablet  every night     carvedilol (COREG) 25 MG tablet Take 1 tablet (25 mg total) by mouth 2 (two) times daily with a meal. 180 tablet 3   CLARITIN 10 MG tablet Take 10 mg by mouth daily.     clopidogrel (PLAVIX) 75 MG tablet Take 1 tablet (75 mg total) by mouth daily. 90 tablet 3   cyclobenzaprine (FLEXERIL) 10 MG tablet Take 10 mg by mouth 3 (three) times daily as needed for muscle spasms.      doxepin (SINEQUAN) 50 MG capsule Take 50 mg by mouth in the morning,  at noon, and at bedtime.     empagliflozin (JARDIANCE) 10 MG TABS tablet Take 1 tablet (10 mg total) by mouth daily before breakfast. 90 tablet 3   Evolocumab (REPATHA SURECLICK) [id number] MG/ML SOAJ Inject 140 mg into the skin every 14 (fourteen) days. 6 mL 3   HUMULIN R 100 UNIT/ML injection Inject 16 Units into the skin 2 (two) times daily before a meal.  0   insulin NPH-regular Human (70-30) 100 UNIT/ML injection Inject 40 Units into the skin 2 (two) times daily.     ipratropium (ATROVENT HFA) 17 MCG/ACT inhaler Inhale 2 puffs into the lungs every 6 (six) hours.     isosorbide mononitrate (IMDUR) 60 MG 24 hr tablet Take 1.5 tablets (90 mg total) by mouth every morning AND 0.5 tablets (30 mg total) at bedtime. 180 tablet 3   naproxen (NAPROSYN) 375 MG tablet Take 1 tablet (375 mg total) by mouth 2 (two) times daily. 20 tablet 0   nicotine (NICODERM CQ - DOSED IN MG/24 HOURS) 14 mg/24hr patch Place 1 patch (14 mg total) onto the skin daily. 28 patch 0   nitroGLYCERIN (NITROSTAT) 0.4 MG SL tablet Place 1 tablet (0.4 mg total) under the tongue as needed for chest pain. 25 tablet 2   pantoprazole (PROTONIX) 40 MG tablet Take 40 mg  by mouth 2 (two) times daily.      polyethylene glycol powder (GLYCOLAX/MIRALAX) powder Take 17 g by mouth daily as needed for moderate constipation.      QUEtiapine (SEROQUEL) 400 MG tablet Take 400 mg by mouth at bedtime.     SEROQUEL 200 MG tablet Take 200 mg by mouth daily.     gabapentin (NEURONTIN) 300 MG capsule Take 300 mg by mouth 3 (three) times daily.     amLODipine (NORVASC) 2.5 MG tablet Take 1 tablet (2.5 mg total) by mouth daily. 90 tablet 3   amoxicillin-clavulanate (AUGMENTIN) 875-125 MG tablet Take 1 tablet by mouth every 12 (twelve) hours. 14 tablet 0   ibuprofen (ADVIL) 800 MG tablet Take 800 mg by mouth 3 (three) times daily as needed.     No facility-administered medications prior to visit.    PAST MEDICAL HISTORY: Past Medical History:  Diagnosis  Date   Acid reflux    Cocaine abuse ([place])    Coronary artery disease, occlusive 2017   2017 or 2018-PCI to LAD (mid-distal); -> anterior STEMI April 2021 -> LAD stent patent, however distal LAD at D3 occluded, only able to reduce to 70% stenosis.  Small caliber RPDA with 80% stenosis.   Heart attack ([place]) 04/2016   High cholesterol    Hypertension    Ischemic cardiomyopathy 11/2019   Neuropathic pain    Stroke ([place]) 03/2020   Tobacco abuse     PAST SURGICAL HISTORY: Past Surgical History:  Procedure Laterality Date   CORONARY/GRAFT ACUTE MI REVASCULARIZATION N/A 11/23/2019   Procedure: CORONARY/GRAFT ACUTE MI REVASCULARIZATION;  Surgeon: Leonie Man, MD;  Location: [facility];  Service: Cardiovascular; unsuccessful PTCA of distal LAD 100%.  O able to restore only minimal flow with 70% stenosis   LEFT HEART CATH AND CORONARY ANGIOGRAPHY N/A 11/23/2019   Procedure: LEFT HEART CATH AND CORONARY ANGIOGRAPHY;  Surgeon: Leonie Man, MD;  Location: [facility];  Service: Cardiovascular; ANTERIOR STEMI: Widely patent mid to distal LAD stent, distal LAD is 100% stenosed associated with the third diagonal.  (Post PTCA - 70% residual but 100% D3).  RPDA tapers to a small vessel -80% stenosis.  Severely elevated LVEDP   NM MYOVIEW LTD  08/27/2020   (recurrent chest pain): Poor study due to gut attenuation.  EF ~50-55%.  No EKG changes.  MLSTLY FIXED LARGE/SEVRE perfusion defect in the basal-apical inferior, mid inferolateral & mid-apical anterior / apical walls: Findings are c/w known anatomy (apical LAD occlusion & PDA 80%, with gut attenuation enhancing inferior involvement -> but Echo with normal EF & no RWA - contradicts this finding.   TRANSTHORACIC ECHOCARDIOGRAM  11/24/2019   -post anterior STEMI: EF mildly reduced 45 to 50%.  GR 1 DD.  Elevated LAP.  No obvious heart WMA.  Moderate-severe AI with mild-moderate Aortic Sclerosis but no Stenosis.   TRANSTHORACIC  ECHOCARDIOGRAM  03/23/2020   (stroke evaluation): EF improved to 55 to 60%.  Significant LVH-notable apical.  Consider cardiac MRI.  Moderate concentric LVH.  GR 1 DD.  Elevated LAP.  Mild to moderate Aortic Sclerosis but no Stenosis.  Moderate AI.     FAMILY HISTORY: Family History  Problem Relation Age of Onset   Hypertension Mother    Diabetes Mother    Hypertension Father    Diabetes Father    Diabetes Maternal Grandmother    Diabetes Maternal Grandfather     SOCIAL HISTORY: Social History   Socioeconomic History  Marital status: Married    Spouse name: Not on file   Number of children: Not on file   Years of education: 6 th   Highest education level: Not on file  Occupational History   Not on file  Tobacco Use   Smoking status: Every Day    Packs/day: 1.50    Years: 20.00    Pack years: 30.00    Types: Cigarettes   Smokeless tobacco: Never  Vaping Use   Vaping Use: Never used  Substance and Sexual Activity   Alcohol use: Not Currently   Drug use: Yes    Types: "Crack" cocaine    Comment: last used yesterday. States " I do it everyday"    Sexual activity: Not on file  Other Topics Concern   Not on file  Social History Narrative   Right handed    Caffeine - no more than 2 cups    Social Determinants of Health   Financial Resource Strain: Not on file  Food Insecurity: Not on file  Transportation Needs: Not on file  Physical Activity: Not on file  Stress: Not on file  Social Connections: Not on file  Intimate Partner Violence: Not on file     PHYSICAL EXAM  GENERAL EXAM/CONSTITUTIONAL: Vitals:  Vitals:   08/31/21 0948  BP: 124/76  Pulse: 84  SpO2: 96%  Weight: 155 lb 8 oz (70.5 kg)  Height: 5\' 2"  (1.575 m)   Body mass index is 28.44 kg/m. Wt Readings from Last 3 Encounters:  08/31/21 155 lb 8 oz (70.5 kg)  06/18/21 161 lb 12.8 oz (73.4 kg)  03/12/21 173 lb (78.5 kg)   Patient is in no distress; well developed, nourished and groomed; neck  is supple  CARDIOVASCULAR: Examination of carotid arteries is normal; no carotid bruits Regular rate and rhythm, no murmurs Examination of peripheral vascular system by observation and palpation is normal  EYES: Ophthalmoscopic exam of optic discs and posterior segments is normal; no papilledema or hemorrhages No results found.  MUSCULOSKELETAL: Gait, strength, tone, movements noted in Neurologic exam below  NEUROLOGIC: MENTAL STATUS:  No flowsheet data found. awake, alert, oriented to person, place and time recent and remote memory intact normal attention and concentration language fluent, comprehension intact, naming intact fund of knowledge appropriate  CRANIAL NERVE:  2nd - no papilledema on fundoscopic exam 2nd, 3rd, 4th, 6th - pupils equal and reactive to light, visual fields full to confrontation, extraocular muscles intact, no nystagmus 5th - facial sensation --> DECR ON RIGHT FACE 7th - facial strength symmetric 8th - hearing intact 9th - palate elevates symmetrically, uvula midline 11th - shoulder shrug symmetric 12th - tongue protrusion midline  MOTOR:  normal bulk and tone, full strength in the BUE, BLE  SENSORY:  normal and symmetric to light touch, temperature, vibration; DECR ON RIGHT SID  COORDINATION:  finger-nose-finger, fine finger movements normal  REFLEXES:  deep tendon reflexes 1+ and symmetric  GAIT/STATION:  narrow based gait     DIAGNOSTIC DATA (LABS, IMAGING, TESTING) - I reviewed patient records, labs, notes, testing and imaging myself where available.  Lab Results  Component Value Date   WBC 5.7 03/12/2021   HGB 14.8 03/12/2021   HCT 43.6 03/12/2021   MCV 94.6 03/12/2021   PLT 175 03/12/2021      Component Value Date/Time   NA 133 (L) 03/12/2021 0732   K 4.4 03/12/2021 0732   CL 98 03/12/2021 0732   CO2 24 03/12/2021 0732   GLUCOSE  360 (H) 03/12/2021 0732   BUN 22 (H) 03/12/2021 0732   CREATININE 1.21 (H) 03/12/2021  0732   CALCIUM 9.6 03/12/2021 0732   PROT 8.5 (H) 03/12/2021 0732   PROT 7.6 01/26/2021 1210   ALBUMIN 4.3 03/12/2021 0732   ALBUMIN 4.6 01/26/2021 1210   AST 18 03/12/2021 0732   ALT 13 03/12/2021 0732   ALKPHOS 71 03/12/2021 0732   BILITOT 0.5 03/12/2021 0732   BILITOT <0.2 01/26/2021 1210   GFRNONAA 55 (L) 03/12/2021 0732   GFRAA >60 03/23/2020 1757   Lab Results  Component Value Date   CHOL 108 01/26/2021   HDL 43 01/26/2021   LDLCALC 28 01/26/2021   TRIG 241 (H) 01/26/2021   CHOLHDL 2.5 01/26/2021   Lab Results  Component Value Date   HGBA1C 9.1 (H) 03/23/2020   No results found for: VITAMINB12 No results found for: TSH   03/23/20 MRI brain [I reviewed images myself and agree with interpretation. -VRP]  1. Small focus of acute/subacute ischemia within the left thalamus. No hemorrhage or mass effect. 2. 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 3. Normal cervical spinal cord. 4. Mild cervical degenerative disc disease without spinal canal or neural foraminal stenosis.  03/23/20 MRA head  1. Advanced, especially for age, intracranial atheromatous change. 2. 70% right supraclinoid ICA stenosis and high-grade narrowing at the left petrous cavernous junction. 3. Advanced left M2 segment stenosis.  03/24/20  Right Carotid: Velocities in the right ICA are consistent with a 1-39%  stenosis.   Left Carotid: Velocities in the left ICA are consistent with a 1-39%  stenosis.   Vertebrals:  Bilateral vertebral arteries demonstrate antegrade flow.  Subclavians: Normal flow hemodynamics were seen in bilateral subclavian               arteries.   03/23/20 TTE 1. LVEF has improved since the prior study on 11/24/2019, from 45-50% to  55-60%. There is significant LVH predominantly in the apical segments, a  cardiac MRI should be considered to evaluate for possible infiltrative and  hypertrophic cardiomyopathies.   2. Left ventricular ejection fraction, by estimation,  is 55 to 60%. The  left ventricle has normal function. The left ventricle has no regional  wall motion abnormalities. There is moderate concentric left ventricular  hypertrophy. Left ventricular  diastolic parameters are consistent with Grade I diastolic dysfunction  (impaired relaxation). Elevated left atrial pressure.   3. Right ventricular systolic function is normal. The right ventricular  size is normal.   4. The mitral valve is normal in structure. Mild mitral valve  regurgitation. No evidence of mitral stenosis.   5. The aortic valve is tricuspid. Aortic valve regurgitation is moderate.  Mild to moderate aortic valve sclerosis/calcification is present, without  any evidence of aortic stenosis.   6. The inferior vena cava is normal in size with greater than 50%  respiratory variability, suggesting right atrial pressure of 3 mmHg.     ASSESSMENT AND PLAN  51 y.o. year old female here with:   Dx:  1. Thalamic pain syndrome   2. Thalamic stroke (HCC)     PLAN:  LEFT THALAMIC STROKE (post stroke numbness and pain on the right side) - increase gabapentin to 900mg  three times a day - consider amitriptyline or lamotrigine; need caution given polypharmacy (seroquel 200 /400) - continue plavix, statin, BP control, DM control per PCP  Meds ordered this encounter  Medications   gabapentin (NEURONTIN) 300 MG capsule    Sig: Take  3 capsules (900 mg total) by mouth 3 (three) times daily.    Dispense:  270 capsule    Refill:  12   Return for pending if symptoms worsen or fail to improve.    Penni Bombard, MD [id number], [id number] Certified in Neurology, Neurophysiology and Neuroimaging  [facility] [address] [place], [place] [address] [phone]

## 2021-08-31 NOTE — Patient Instructions (Signed)
°  LEFT THALAMIC STROKE (post stroke numbness and pain on the right side) - increase gabapentin to 900mg  three times a day - consider amitriptyline or lamotrigine; need caution given polypharmacy (seroquel 200 /400) - continue plavix, statin, BP control, DM control per PCP

## 2021-09-17 ENCOUNTER — Ambulatory Visit: Payer: Medicaid Other | Admitting: Internal Medicine

## 2021-09-17 ENCOUNTER — Telehealth: Payer: Self-pay | Admitting: Diagnostic Neuroimaging

## 2021-09-17 NOTE — Telephone Encounter (Signed)
Pt's husband states the strength of :gabapentin (NEURONTIN) 300 MG capsule is not helping.  Pt's leg pain is worse, please call.

## 2021-09-17 NOTE — Telephone Encounter (Signed)
I called the pt's husband back. He advised pt has been taking 600 mg three times per day of the gabapentin and sx have not improved.  I advised per Dr. Fonda Kinder last ov note the pt can increase to 900 mg three times per day to see if sx improve. Husband verbalized understanding and appreciation for the call and will let us know how she does on this dosage.

## 2021-09-28 ENCOUNTER — Telehealth: Payer: Self-pay | Admitting: Cardiology

## 2021-09-28 NOTE — Telephone Encounter (Signed)
°*  STAT* If patient is at the pharmacy, call can be transferred to refill team.   1. Which medications need to be refilled? (please list name of each medication and dose if known) All heart medications   2. Which pharmacy/location (including street and city if local pharmacy) is medication to be sent to?  [facility] [address].  3. Do they need a 30 day or 90 day supply? 90 day supply

## 2021-09-29 MED ORDER — CARVEDILOL 25 MG PO TABS: 25.0000 mg | ORAL_TABLET | Freq: Two times a day (BID) | ORAL | 0 refills | Status: DC

## 2021-09-29 MED ORDER — AMLODIPINE BESYLATE 2.5 MG PO TABS: 2.5000 mg | ORAL_TABLET | Freq: Every day | ORAL | 0 refills | Status: DC

## 2021-09-29 MED ORDER — ISOSORBIDE MONONITRATE ER 60 MG PO TB24: ORAL_TABLET | ORAL | 0 refills | Status: DC

## 2021-09-29 MED ORDER — EMPAGLIFLOZIN 10 MG PO TABS: 10.0000 mg | ORAL_TABLET | Freq: Every day | ORAL | 0 refills | Status: DC

## 2021-09-29 MED ORDER — ATORVASTATIN CALCIUM 80 MG PO TABS: 80.0000 mg | ORAL_TABLET | Freq: Every day | ORAL | 0 refills | Status: DC

## 2021-09-29 MED ORDER — CLOPIDOGREL BISULFATE 75 MG PO TABS: 75.0000 mg | ORAL_TABLET | Freq: Every day | ORAL | 0 refills | Status: DC

## 2021-09-29 MED ORDER — REPATHA SURECLICK 140 MG/ML ~~LOC~~ SOAJ: 140.0000 mg | SUBCUTANEOUS | 0 refills | Status: DC

## 2021-09-29 MED ORDER — PANTOPRAZOLE SODIUM 40 MG PO TBEC: 40.0000 mg | DELAYED_RELEASE_TABLET | Freq: Two times a day (BID) | ORAL | 0 refills | Status: DC

## 2021-09-29 NOTE — Telephone Encounter (Signed)
Refills has been sent to the pharmacy. 

## 2021-10-06 ENCOUNTER — Ambulatory Visit (INDEPENDENT_AMBULATORY_CARE_PROVIDER_SITE_OTHER): Payer: Medicaid Other | Admitting: Internal Medicine

## 2021-10-06 ENCOUNTER — Encounter: Payer: Self-pay | Admitting: Internal Medicine

## 2021-10-06 ENCOUNTER — Other Ambulatory Visit: Payer: Self-pay

## 2021-10-06 ENCOUNTER — Ambulatory Visit (INDEPENDENT_AMBULATORY_CARE_PROVIDER_SITE_OTHER)
Admission: RE | Admit: 2021-10-06 | Discharge: 2021-10-06 | Disposition: A | Discharge status: Home / Self Care | Payer: Medicaid Other | Source: Ambulatory Visit | Attending: Internal Medicine | Admitting: Internal Medicine

## 2021-10-06 ENCOUNTER — Telehealth: Payer: Self-pay

## 2021-10-06 VITALS — BP 104/72 | HR 84 | Ht 62.0 in | Wt 159.0 lb

## 2021-10-06 DIAGNOSIS — Z1211 Encounter for screening for malignant neoplasm of colon: Secondary | ICD-10-CM | POA: Diagnosis not present

## 2021-10-06 DIAGNOSIS — K5900 Constipation, unspecified: Secondary | ICD-10-CM

## 2021-10-06 DIAGNOSIS — R140 Abdominal distension (gaseous): Secondary | ICD-10-CM

## 2021-10-06 IMAGING — DX DG ABDOMEN 1V
2 series · 2 of 2 positions shown · non-contrast
Comparison: [DATE] CT abdomen

CLINICAL DATA: Bloating, constipation

EXAM:
ABDOMEN - 1 VIEW

[abdomen kub (1 of 2)]
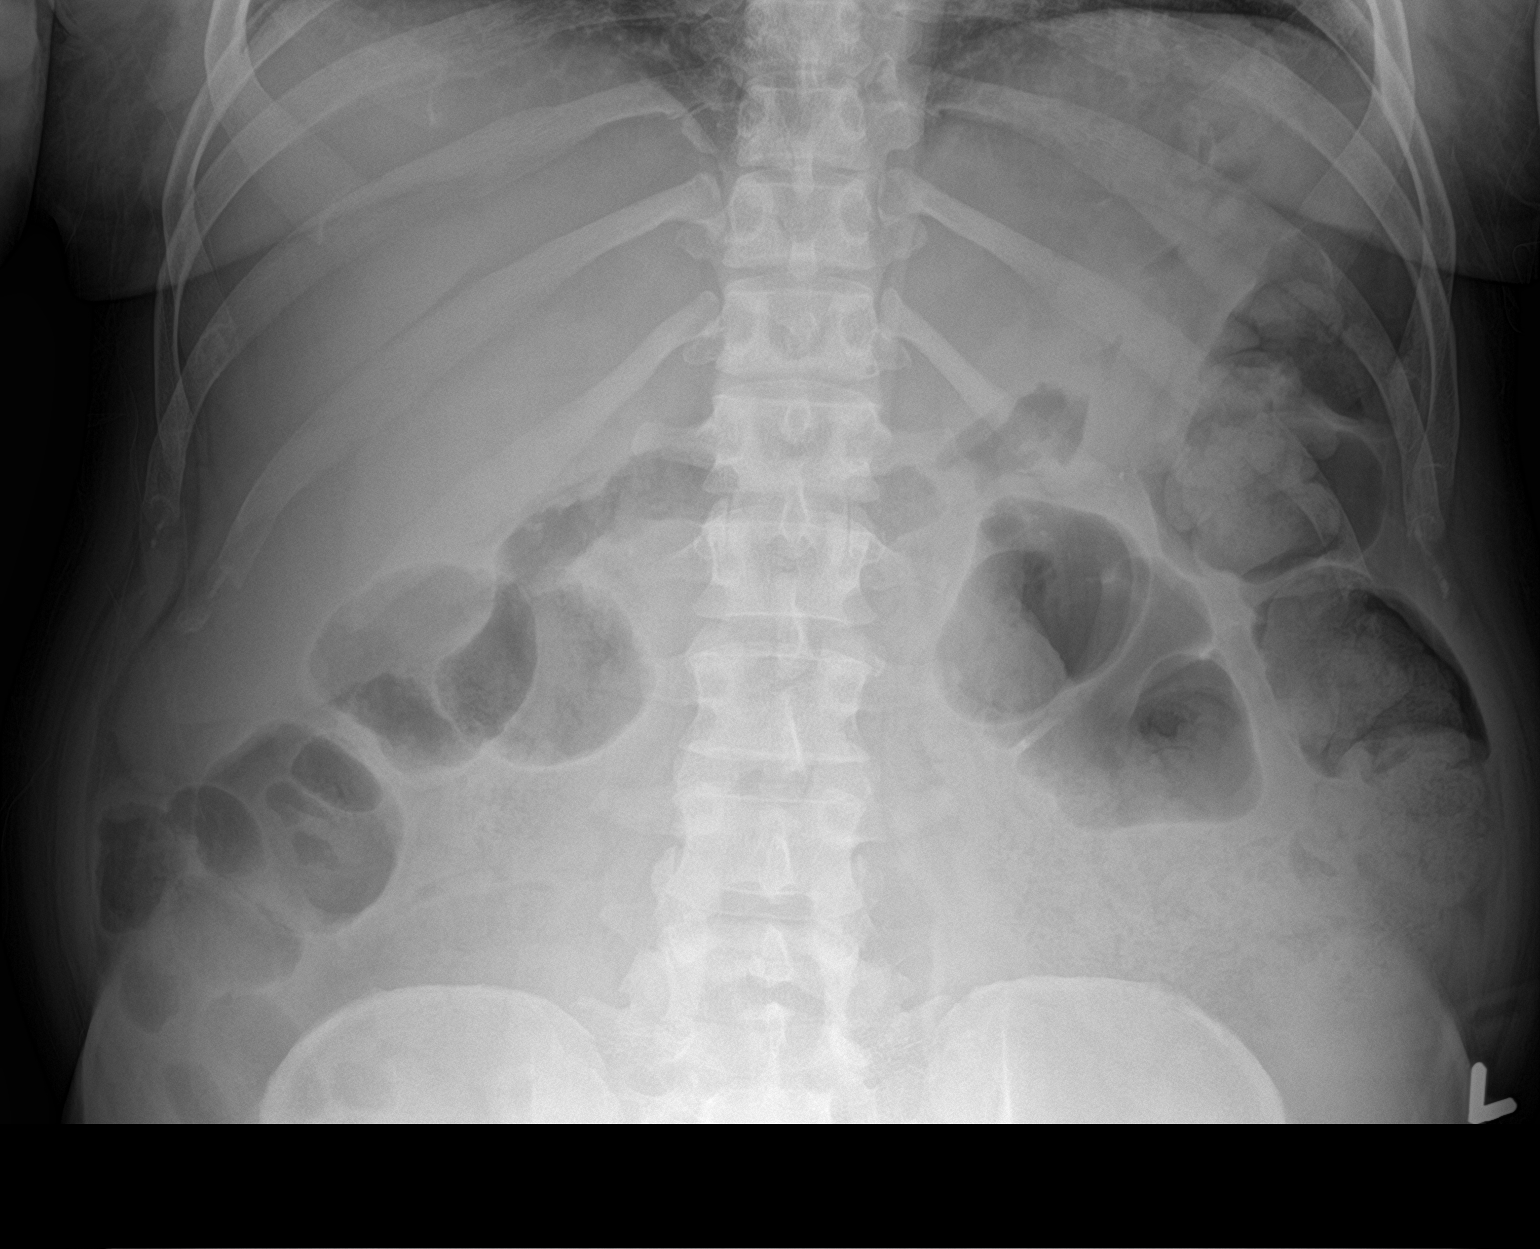

[abdomen kub (2 of 2)]
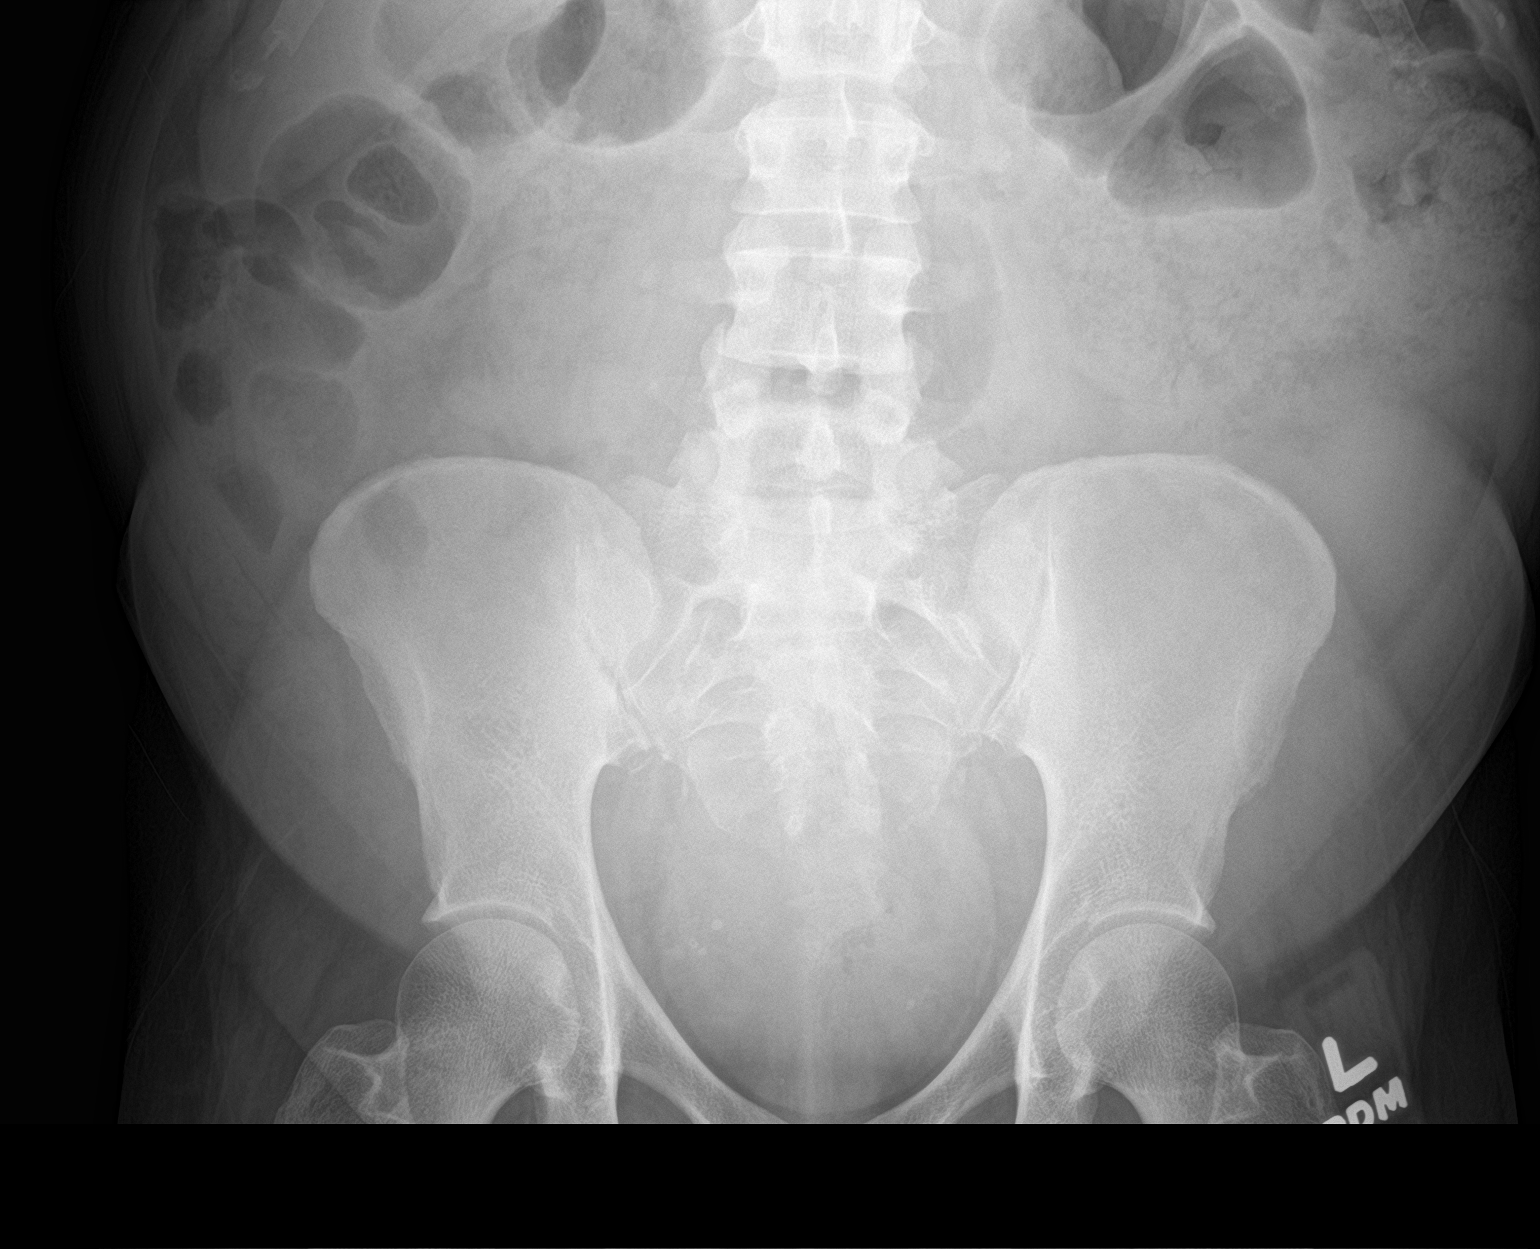

[2 of 2 positions shown; findings below may reference images not displayed]

FINDINGS: Moderate amount of stool in the transverse and descending colon. No
bowel dilatation to suggest obstruction. No evidence of
pneumoperitoneum, portal venous gas or pneumatosis.

No pathologic calcifications along the expected course of the
ureters.

No acute osseous abnormality.
IMPRESSION: 1. Moderate amount of stool in the transverse and descending colon.

## 2021-10-06 NOTE — Patient Instructions (Signed)
Your provider has requested that you go to the basement level for an abdominal x-ray before leaving today. Press "B" on the elevator.  ? ?You have been scheduled for a colonoscopy. Please follow written instructions given to you at your visit today.  ?Please pick up your prep supplies at the pharmacy within the next 1-3 days. ?If you use inhalers (even only as needed), please bring them with you on the day of your procedure. ? ?You will be contacted by our office prior to your procedure for directions on holding your Plavix.  If you do not hear from our office 1 week prior to your scheduled procedure, please call [phone] to discuss.  ? ?Start an Low-fodmap diet. ? ?Do a trial of a lactose free diet x 7 days.  ? ?You can take over the counter Gas-x as needed for gas and bloating. ? ?Start an over the counter daily fiber supplement. ? ?Due to recent changes in healthcare laws, you may see the results of your imaging and laboratory studies on MyChart before your provider has had a chance to review them.  We understand that in some cases there may be results that are confusing or concerning to you. Not all laboratory results come back in the same time frame and the provider may be waiting for multiple results in order to interpret others.  Please give [place] 48 hours in order for your provider to thoroughly review all the results before contacting the office for clarification of your results.  ? ?The [facility] providers would like to encourage you to use [facility] to communicate with providers for non-urgent requests or questions.  Due to long hold times on the telephone, sending your provider a message by [facility] may be a faster and more efficient way to get a response.  Please allow 48 business hours for a response.  Please remember that this is for non-urgent requests.  ? ? ? ? ?

## 2021-10-06 NOTE — Telephone Encounter (Signed)
Primary Cardiologist:Maria Herbie [place], MD ? ?Chart reviewed as part of pre-operative protocol coverage. Because of Maria Harper's past medical history and time since last visit, he/she will require a follow-up visit in order to better assess preoperative cardiovascular risk. ? ?Patient has an appointment with Dr. Herbie Harper on 10/30/21.  ?Routing message to Dr. Herbie Harper to verify that patient may hold Plavix for 5 days prior to colonoscopy scheduled for 11/02/21.  ?Dr. Herbie Harper, please review and send response regarding Plavix to p cv div preop since patient will need to start the hold prior to her appointment with you.  ? ?[name], NP-C ? ?  ?10/06/2021, 10:25 AM ?[facility] ?[address] ?Office [phone] Fax [phone] ? ?

## 2021-10-06 NOTE — Progress Notes (Signed)
Chief Complaint: Bloating  HPI : 51 year old female with history of cocaine abuse, CAD s/p PCI, DM, GERD, CVA presents with bloating  She has issues with bloating for years. The bloating gets worse and better on occasion. It has been stable over time. She eats constantly, and it doesn't seem like it affects the bloating. She stays constipated. She can not have a BM for weeks. She is on a new medicine Linzess 145 mcg QD to help with constipation. The Linzess works very well and almost causes her to go to the bathroom too frequently. If she drinks something, she has to run to the bathroom when she is on the Linezess. She is thinking about taking the Linzess less frequently. Denies ab pain, N&V, dysphagia, or blood in stools. She has lost about 20-30 lbs over the last few months due to a change in her diabetes medications. She does have history of GERD and takes pantoprazole 40 mg BID, which keeps it under control. Denies fam hx of GI issues. Denies prior history of surgeries.  Wt Readings from Last 3 Encounters:  10/06/21 159 lb (72.1 kg)  08/31/21 155 lb 8 oz (70.5 kg)  06/18/21 161 lb 12.8 oz (73.4 kg)   Past Medical History:  Diagnosis Date   Acid reflux    Cocaine abuse (HCC)    Coronary artery disease, occlusive 2017   2017 or 2018-PCI to LAD (mid-distal); -> anterior STEMI April 2021 -> LAD stent patent, however distal LAD at D3 occluded, only able to reduce to 70% stenosis.  Small caliber RPDA with 80% stenosis.   Diabetes (HCC)    Heart attack (HCC) 04/2016   High cholesterol    Hypertension    Ischemic cardiomyopathy 11/2019   Neuropathic pain    Stroke (HCC) 03/2020   Tobacco abuse      Past Surgical History:  Procedure Laterality Date   CORONARY/GRAFT ACUTE MI REVASCULARIZATION N/A 11/23/2019   Procedure: CORONARY/GRAFT ACUTE MI REVASCULARIZATION;  Surgeon: [name], MD;  Location: [facility] INVASIVE CV LAB;  Service: Cardiovascular; unsuccessful PTCA of distal LAD 100%.   O able to restore only minimal flow with 70% stenosis   LEFT HEART CATH AND CORONARY ANGIOGRAPHY N/A 11/23/2019   Procedure: LEFT HEART CATH AND CORONARY ANGIOGRAPHY;  Surgeon: [name], MD;  Location: [facility] INVASIVE CV LAB;  Service: Cardiovascular; ANTERIOR STEMI: Widely patent mid to distal LAD stent, distal LAD is 100% stenosed associated with the third diagonal.  (Post PTCA - 70% residual but 100% D3).  RPDA tapers to a small vessel -80% stenosis.  Severely elevated LVEDP   NM MYOVIEW LTD  08/27/2020   (recurrent chest pain): Poor study due to gut attenuation.  EF ~50-55%.  No EKG changes.  MLSTLY FIXED LARGE/SEVRE perfusion defect in the basal-apical inferior, mid inferolateral & mid-apical anterior / apical walls: Findings are c/w known anatomy (apical LAD occlusion & PDA 80%, with gut attenuation enhancing inferior involvement -> but Echo with normal EF & no RWA - contradicts this finding.   TRANSTHORACIC ECHOCARDIOGRAM  11/24/2019   -post anterior STEMI: EF mildly reduced 45 to 50%.  GR 1 DD.  Elevated LAP.  No obvious heart WMA.  Moderate-severe AI with mild-moderate Aortic Sclerosis but no Stenosis.   TRANSTHORACIC ECHOCARDIOGRAM  03/23/2020   (stroke evaluation): EF improved to 55 to 60%.  Significant LVH-notable apical.  Consider cardiac MRI.  Moderate concentric LVH.  GR 1 DD.  Elevated LAP.  Mild to moderate Aortic  Sclerosis but no Stenosis.  Moderate AI.    Family History  Problem Relation Age of Onset   Hypertension Mother    Diabetes Mother    Hypertension Father    Diabetes Father    Diabetes Maternal Grandmother    Diabetes Maternal Grandfather    Social History   Tobacco Use   Smoking status: Every Day    Packs/day: 1.50    Years: 20.00    Pack years: 30.00    Types: Cigarettes   Smokeless tobacco: Never  Vaping Use   Vaping Use: Never used  Substance Use Topics   Alcohol use: Not Currently   Drug use: Not Currently    Types: "Crack" cocaine    Comment:  last used yesterday. States " I do it everyday"    Current Outpatient Medications  Medication Sig Dispense Refill   acetaminophen (TYLENOL) 500 MG tablet Take 1,000 mg by mouth in the morning, at noon, and at bedtime.     amLODipine (NORVASC) 2.5 MG tablet Take 1 tablet (2.5 mg total) by mouth daily. KEEP OV. 90 tablet 0   atorvastatin (LIPITOR) 80 MG tablet Take 1 tablet (80 mg total) by mouth daily. KEEP OV. 90 tablet 0   carvedilol (COREG) 25 MG tablet Take 1 tablet (25 mg total) by mouth 2 (two) times daily with a meal. KEEP OV. 180 tablet 0   CLARITIN 10 MG tablet Take 10 mg by mouth daily.     clopidogrel (PLAVIX) 75 MG tablet Take 1 tablet (75 mg total) by mouth daily. KEEP OV. 90 tablet 0   cyclobenzaprine (FLEXERIL) 10 MG tablet Take 10 mg by mouth 3 (three) times daily as needed for muscle spasms.      doxepin (SINEQUAN) 50 MG capsule Take 50 mg by mouth in the morning, at noon, and at bedtime.     empagliflozin (JARDIANCE) 10 MG TABS tablet Take 1 tablet (10 mg total) by mouth daily before breakfast. KEEP OV. 90 tablet 0   Evolocumab (REPATHA SURECLICK) 140 MG/ML SOAJ Inject 140 mg into the skin every 14 (fourteen) days. KEEP OV. 6 mL 0   gabapentin (NEURONTIN) 300 MG capsule Take 3 capsules (900 mg total) by mouth 3 (three) times daily. 270 capsule 12   Insulin Glargine Solostar (LANTUS) 100 UNIT/ML Solostar Pen Inject 30 Units into the skin daily.     insulin lispro (HUMALOG) 100 UNIT/ML KwikPen Inject 10 Units into the skin in the morning, at noon, and at bedtime.     ipratropium (ATROVENT HFA) 17 MCG/ACT inhaler Inhale 2 puffs into the lungs every 6 (six) hours.     isosorbide mononitrate (IMDUR) 60 MG 24 hr tablet Take 1.5 tablets (90 mg total) by mouth every morning AND 0.5 tablets (30 mg total) at bedtime. KEEP OV.. 180 tablet 0   naproxen (NAPROSYN) 375 MG tablet Take 1 tablet (375 mg total) by mouth 2 (two) times daily. 20 tablet 0   nicotine (NICODERM CQ - DOSED IN MG/24  HOURS) 14 mg/24hr patch Place 1 patch (14 mg total) onto the skin daily. 28 patch 0   nitroGLYCERIN (NITROSTAT) 0.4 MG SL tablet Place 1 tablet (0.4 mg total) under the tongue as needed for chest pain. 25 tablet 2   pantoprazole (PROTONIX) 40 MG tablet Take 1 tablet (40 mg total) by mouth 2 (two) times daily. KEEP OV. 180 tablet 0   polyethylene glycol powder (GLYCOLAX/MIRALAX) powder Take 17 g by mouth daily as needed for moderate constipation.  QUEtiapine (SEROQUEL) 400 MG tablet Take 400 mg by mouth at bedtime.     SEROQUEL 200 MG tablet Take 200 mg by mouth daily.     No current facility-administered medications for this visit.   Allergies  Allergen Reactions   Bee Venom Anaphylaxis   Contrast Media [Iodinated Contrast Media] Hives and Itching   Tomato Itching and Rash     Review of Systems: All systems reviewed and negative except where noted in HPI.   Physical Exam: BP 104/72    Pulse 84    Ht 5\' 2"  (1.575 m)    Wt 159 lb (72.1 kg)    BMI 29.08 kg/m  Constitutional: Pleasant,well-developed, female in no acute distress. HEENT: Normocephalic and atraumatic. Conjunctivae are normal. No scleral icterus. Cardiovascular: Normal rate, regular rhythm.  Pulmonary/chest: Effort normal and breath sounds normal. No wheezing, rales or rhonchi. Abdominal: Soft, nondistended, nontender. Bowel sounds active throughout. There are no masses palpable. No hepatomegaly. Extremities: No edema Neurological: Alert and oriented to person place and time. Skin: Skin is warm and dry. No rashes noted. Psychiatric: Normal mood and affect. Behavior is normal.  Labs 03/2021: LFTs unremarkable. CBC nml. BMP with elevated Cr of 1.21, elevated glucose of 360, Na 133.   CT A/P w/o contrast 09/19/19: IMPRESSION: Dependent airspace disease bilaterally is worse on the right and could be due to atelectasis but may also represent pneumonia, particularly in the right lower lobe. No acute abnormality abdomen or  pelvis. Negative for urinary tract stone. Atherosclerosis. Fatty infiltration of the liver. Small fat containing umbilical hernia.  CT Renal Stone Study 02/06/20: IMPRESSION: 1. No acute findings in the abdomen or pelvis. Specifically, no findings to explain the patient's history of left-sided flank pain. No urinary stone disease. No secondary changes in either kidney or ureter. 2. Left-sided renal scarring. 3. Aortic Atherosclerosis (ICD10-I70.0).  ASSESSMENT AND PLAN:  Bloating Constipation Colon cancer screening Patient presents with bloating that she has been dealing with for years.  Has also history of significant constipation for which she is currently on Linzess.  I asked her to try a low FODMAP diet and not ingesting any lactose for a week to see if this will help with her symptoms.  Also asked her to use Gas-X as needed and to start a daily fiber supplement to try and regulate her bowel movements.  We will also check a KUB to see if she has any significant amounts of stool burden that may be still leading to her bloating.  Since patient is due for colon cancer screening based upon her age, I discussed the risk and benefits of colonoscopy with the patient, and she is agreeable to proceeding. - Low FODMAP diet - Trial of lactose free diet - Gas-X PRN - Start daily fiber supplement - Cont Linzess 145 mcg QD - KUB - Colonoscopy [facility]. Will contact Dr. 04/08/20 about holding Plavix.   Lazarus Salines, MD

## 2021-10-06 NOTE — Telephone Encounter (Signed)
 [facility] Pre-operative Risk Assessment  ?   ?Request for surgical clearance:     Endoscopy Procedure ? ?What type of surgery is being performed?     colonoscopy ? ?When is this surgery scheduled?     11/02/21 ? ?What type of clearance is required ?   Pharmacy ? ?Are there any medications that need to be held prior to surgery and how long? Plavix x 5 days ? ?Practice name and name of physician performing surgery?      [facility] ? ?What is your office phone and fax number?      Phone- [phone]  Fax- [phone] ? ?Anesthesia type (None, local, MAC, general) ?       [facility] ? ?

## 2021-10-07 NOTE — Telephone Encounter (Signed)
Called patient and advised her to hold Plavix beginning 10/28/21. She verbalized understanding and agreement is aware of appointment with Dr. Herbie Baltimore on 10/30/21. Clearance for colonoscopy will be addressed at that visit.  ?

## 2021-10-08 ENCOUNTER — Other Ambulatory Visit: Payer: Self-pay | Admitting: Family Medicine

## 2021-10-08 ENCOUNTER — Other Ambulatory Visit: Payer: Self-pay | Admitting: Cardiology

## 2021-10-08 DIAGNOSIS — Z1231 Encounter for screening mammogram for malignant neoplasm of breast: Secondary | ICD-10-CM

## 2021-10-30 ENCOUNTER — Ambulatory Visit: Payer: Medicaid Other | Admitting: Cardiology

## 2021-11-02 ENCOUNTER — Encounter: Payer: Medicaid Other | Admitting: Internal Medicine

## 2021-11-04 ENCOUNTER — Other Ambulatory Visit: Payer: Self-pay | Admitting: Cardiology

## 2021-11-11 ENCOUNTER — Encounter: Payer: Medicaid Other | Admitting: Physician Assistant

## 2021-11-11 NOTE — Telephone Encounter (Signed)
Patient had rescheduled her EKG/preop clearance for today. Please advise if patient can hold Plavix for colonoscopy in May. ?

## 2021-11-11 NOTE — Telephone Encounter (Signed)
Left message for patient to return my call.

## 2021-11-11 NOTE — Telephone Encounter (Signed)
Per  Dr. Ellyn Hack,  ? ?Okay to hold Plavix 5-7 days preop for surgery procedures.   ?

## 2021-11-12 NOTE — Telephone Encounter (Signed)
Patient informed she can hold Plavix 5 days prior to her procedure per Cardiology. Patient verbalized understanding. ?

## 2021-11-12 NOTE — Telephone Encounter (Signed)
Patient returned your call and asked you call her back when you got a minute.  Thank you. ?

## 2021-11-13 NOTE — Progress Notes (Signed)
This encounter was created in error - please disregard.

## 2021-11-16 ENCOUNTER — Ambulatory Visit: Payer: Medicaid Other | Admitting: Physician Assistant

## 2021-11-16 ENCOUNTER — Encounter: Payer: Self-pay | Admitting: Physician Assistant

## 2021-11-16 NOTE — Progress Notes (Deleted)
?Cardiology Office Note:   ? ?Date:  11/16/2021  ? ?ID:  Maria Harper, DOB [birth year], MRN [id number] ? ?PCP:  Vassie Moment, MD ?  ?[facility] Providers ?Cardiologist:  Glenetta Hew, MD { ?Click to update primary MD,subspecialty MD or APP then REFRESH:1}   ? ?Referring MD: Marliss Coots, NP  ? ?No chief complaint on file. ?*** ? ?History of Present Illness:   ? ?Maria Harper is a 51 y.o. female with a hx of CAD, ICM with normalization of EF, HTN, HLD, DM II, and h/o cocaine abuse.  He had DES to LAD in 2018.  This was placed in [place].  Patient had anterior STEMI in April 2021, previous placed LAD stent was patent, however patient had 100% stenosis in the distal LAD and 100% stenosis in a side branch of D3.  PTCA was performed and the reduced LAD lesion to 70% however unable to cross the D3 lesion.  Patient also had 80% stenosis in RPDA that was too small for PCI.  Echocardiogram at the time showed EF 45 to 50% with normal wall motion, grade 1 DD, mild to moderate AI.  He was readmitted in August 2021 with strokelike symptom.  MRI of the brain showed small focus of acute/subacute ischemia within the left thalamus.  Echocardiogram at the time showed EF 55 to 60%, significant LVH predominantly in the apical segment, grade 1 DD, mild MR, moderate AI.  She underwent repeat Myoview in December 2021 due to chest discomfort, however was a poor study due to severe extracardiac tracer uptake in the carotid making the image essentially uninterpretable, there was fixed defect in the anterior apical wall and apex.  Dr. Ellyn Hack felt his symptom is likely due to coronary spasm and microvascular disease.  Medical therapy was recommended as all of her coronary artery disease were distal and that not revascularizable.  She was seen in the ED in August 2020 with chest pain that occurred while participating in a sleep study.  EKG showed no acute changes.  The history of serial troponin negative.  D-dimer  negative. ? ?Patient was last seen by [name] on 06/18/2021 for intermittent chest discomfort. Imdur was increased to 90 mg in a.m. and 30 mg p.m. ? ?Past Medical History:  ?Diagnosis Date  ? Acid reflux   ? Cocaine abuse ([place])   ? Coronary artery disease, occlusive 2017  ? 2017 or 2018-PCI to LAD (mid-distal); -> anterior STEMI April 2021 -> LAD stent patent, however distal LAD at D3 occluded, only able to reduce to 70% stenosis.  Small caliber RPDA with 80% stenosis.  ? Diabetes ([place])   ? Heart attack ([place]) 04/2016  ? High cholesterol   ? Hypertension   ? Ischemic cardiomyopathy 11/2019  ? Neuropathic pain   ? Stroke [facility]) 03/2020  ? Tobacco abuse   ? ? ?Past Surgical History:  ?Procedure Laterality Date  ? CORONARY/GRAFT ACUTE MI REVASCULARIZATION N/A 11/23/2019  ? Procedure: CORONARY/GRAFT ACUTE MI REVASCULARIZATION;  Surgeon: Leonie Man, MD;  Location: [place] CV LAB;  Service: Cardiovascular; unsuccessful PTCA of distal LAD 100%.  O able to restore only minimal flow with 70% stenosis  ? LEFT HEART CATH AND CORONARY ANGIOGRAPHY N/A 11/23/2019  ? Procedure: LEFT HEART CATH AND CORONARY ANGIOGRAPHY;  Surgeon: Leonie Man, MD;  Location: [place] CV LAB;  Service: Cardiovascular; ANTERIOR STEMI: Widely patent mid to distal LAD stent, distal LAD is 100% stenosed associated with the third diagonal.  (Post  PTCA - 70% residual but 100% D3).  RPDA tapers to a small vessel -80% stenosis.  Severely elevated LVEDP  ? NM MYOVIEW LTD  08/27/2020  ? (recurrent chest pain): Poor study due to gut attenuation.  EF ~50-55%.  No EKG changes.  MLSTLY FIXED LARGE/SEVRE perfusion defect in the basal-apical inferior, mid inferolateral & mid-apical anterior / apical walls: Findings are c/w known anatomy (apical LAD occlusion & PDA 80%, with gut attenuation enhancing inferior involvement -> but Echo with normal EF & no RWA - contradicts this finding.  ? TRANSTHORACIC ECHOCARDIOGRAM  11/24/2019  ? -post  anterior STEMI: EF mildly reduced 45 to 50%.  GR 1 DD.  Elevated LAP.  No obvious heart WMA.  Moderate-severe AI with mild-moderate Aortic Sclerosis but no Stenosis.  ? TRANSTHORACIC ECHOCARDIOGRAM  03/23/2020  ? (stroke evaluation): EF improved to 55 to 60%.  Significant LVH-notable apical.  Consider cardiac MRI.  Moderate concentric LVH.  GR 1 DD.  Elevated LAP.  Mild to moderate Aortic Sclerosis but no Stenosis.  Moderate AI.   ? ? ?Current Medications: ?No outpatient medications have been marked as taking for the 11/16/21 encounter (Appointment) with Almyra Deforest, [place].  ?  ? ?Allergies:   Bee venom, Contrast media [iodinated contrast media], and Tomato  ? ?Social History  ? ?Socioeconomic History  ? Marital status: Married  ?  Spouse name: Not on file  ? Number of children: 0  ? Years of education: 6 th  ? Highest education level: Not on file  ?Occupational History  ? Not on file  ?Tobacco Use  ? Smoking status: Every Day  ?  Packs/day: 1.50  ?  Years: 20.00  ?  Pack years: 30.00  ?  Types: Cigarettes  ? Smokeless tobacco: Never  ?Vaping Use  ? Vaping Use: Never used  ?Substance and Sexual Activity  ? Alcohol use: Not Currently  ? Drug use: Not Currently  ?  Types: "Crack" cocaine  ?  Comment: last used yesterday. States " I do it everyday"   ? Sexual activity: Not on file  ?Other Topics Concern  ? Not on file  ?Social History Narrative  ? Right handed   ? Caffeine - no more than 2 cups   ? ?Social Determinants of Health  ? ?Financial Resource Strain: Not on file  ?Food Insecurity: Not on file  ?Transportation Needs: Not on file  ?Physical Activity: Not on file  ?Stress: Not on file  ?Social Connections: Not on file  ?  ? ?Family History: ?The patient's ***family history includes Diabetes in her father, maternal grandfather, maternal grandmother, and mother; Hypertension in her father and mother. ? ?ROS:   ?Please see the history of present illness.    ?*** All other systems reviewed and are  negative. ? ?EKGs/Labs/Other Studies Reviewed:   ? ?The following studies were reviewed today: ?*** ? ?EKG:  EKG is *** ordered today.  The ekg ordered today demonstrates *** ? ?Recent Labs: ?03/12/2021: ALT 13; BUN 22; Creatinine, Ser 1.21; Hemoglobin 14.8; Platelets 175; Potassium 4.4; Sodium 133  ?Recent Lipid Panel ?   ?Component Value Date/Time  ? CHOL 108 01/26/2021 1210  ? TRIG 241 (H) 01/26/2021 1210  ? HDL 43 01/26/2021 1210  ? CHOLHDL 2.5 01/26/2021 1210  ? CHOLHDL 4.5 03/23/2020 1237  ? VLDL 39 03/23/2020 1237  ? [place] 28 01/26/2021 1210  ? ? ? ?Risk Assessment/Calculations:   ?{Does this patient have ATRIAL FIBRILLATION?[phone]} ? ?    ? ?Physical  Exam:   ? ?VS:  There were no vitals taken for this visit.   ? ?Wt Readings from Last 3 Encounters:  ?10/06/21 159 lb (72.1 kg)  ?08/31/21 155 lb 8 oz (70.5 kg)  ?06/18/21 161 lb 12.8 oz (73.4 kg)  ?  ? ?GEN: *** Well nourished, well developed in no acute distress ?HEENT: Normal ?NECK: No JVD; No carotid bruits ?LYMPHATICS: No lymphadenopathy ?CARDIAC: ***RRR, no murmurs, rubs, gallops ?RESPIRATORY:  Clear to auscultation without rales, wheezing or rhonchi  ?ABDOMEN: Soft, non-tender, non-distended ?MUSCULOSKELETAL:  No edema; No deformity  ?SKIN: Warm and dry ?NEUROLOGIC:  Alert and oriented x 3 ?PSYCHIATRIC:  Normal affect  ? ?ASSESSMENT:   ? ?No diagnosis found. ?PLAN:   ? ?In order of problems listed above: ? ?*** ? ?   ? ?{Are you ordering a CV Procedure (e.g. stress test, cath, DCCV, TEE, etc)?   Press F2        :[id number]  ? ? ?Medication Adjustments/Labs and Tests Ordered: ?Current medicines are reviewed at length with the patient today.  Concerns regarding medicines are outlined above.  ?No orders of the defined types were placed in this encounter. ? ?No orders of the defined types were placed in this encounter. ? ? ?There are no Patient Instructions on file for this visit.  ? ?Signed, ?Almyra Deforest, [place]  ?11/16/2021 1:51 PM    ?[place] ?

## 2021-11-17 ENCOUNTER — Ambulatory Visit: Payer: Medicaid Other

## 2021-11-19 ENCOUNTER — Telehealth: Payer: Self-pay | Admitting: Diagnostic Neuroimaging

## 2021-11-19 MED ORDER — CARBAMAZEPINE ER 100 MG PO CP12: 100.0000 mg | ORAL_CAPSULE | Freq: Two times a day (BID) | ORAL | 6 refills | Status: DC

## 2021-11-19 NOTE — Addendum Note (Signed)
Addended by: Joycelyn Schmid R on: 11/19/2021 01:27 PM ? ? Modules accepted: Orders ? ?

## 2021-11-19 NOTE — Telephone Encounter (Signed)
?  Will from [facility] requesting information on if prescribed carbamazepine 100mg  needs to be filled as chewable pills or extended release.  ?Would like a call back ?[phone] ?

## 2021-11-19 NOTE — Telephone Encounter (Signed)
FYI to POD 3 pt's husband reported that pt is failing to improve.  Husband did not want to wait for Dr Richrd Humbles next avail.  He accepted the next available for Amy, NP along with wait list.  ?

## 2021-11-19 NOTE — Telephone Encounter (Signed)
Can add carbamazepine 100mg  twice a day. Sent to [facility].  ? ? ?Meds ordered this encounter  ?Medications  ? carbamazepine (CARBATROL) 100 MG 12 hr capsule  ?  Sig: Take 1 capsule (100 mg total) by mouth 2 (two) times daily.  ?  Dispense:  60 capsule  ?  Refill:  6  ? ?Penni Bombard, MD [id number], [id number] PM ?Certified in Neurology, Neurophysiology and Neuroimaging ? ?[facility] ?[address], [address] ?[place], [place] [address] ?[phone] ? ?

## 2021-11-19 NOTE — Telephone Encounter (Signed)
[employer], spoke with Will, informed him the Rx is for Extended release tablet. He stated he will have to order it, may take a couple days, verbalized understanding, appreciation. ? ?

## 2021-11-19 NOTE — Telephone Encounter (Signed)
FYI

## 2021-11-20 ENCOUNTER — Ambulatory Visit
Admission: RE | Admit: 2021-11-20 | Discharge: 2021-11-20 | Disposition: A | Discharge status: Home / Self Care | Payer: Medicaid Other | Source: Ambulatory Visit | Attending: Family Medicine | Admitting: Family Medicine

## 2021-11-20 DIAGNOSIS — Z1231 Encounter for screening mammogram for malignant neoplasm of breast: Secondary | ICD-10-CM

## 2021-11-20 IMAGING — MG MM DIGITAL SCREENING BILAT W/ TOMO AND CAD
8 series · 8 of 24 positions shown · non-contrast
Comparison: None.

CLINICAL DATA: Screening.

EXAM:
DIGITAL SCREENING BILATERAL MAMMOGRAM WITH TOMOSYNTHESIS AND CAD
TECHNIQUE: Bilateral screening digital craniocaudal and mediolateral oblique
mammograms were obtained. Bilateral screening digital breast
tomosynthesis was performed. The images were evaluated with
computer-aided detection.

[R MLO synth-2D]
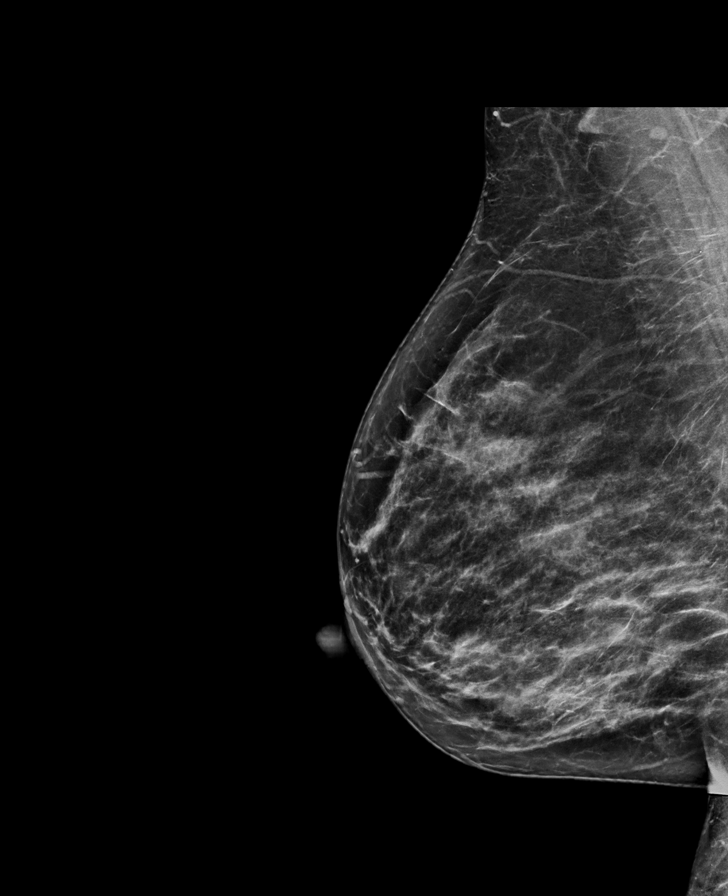

[L CC synth-2D]
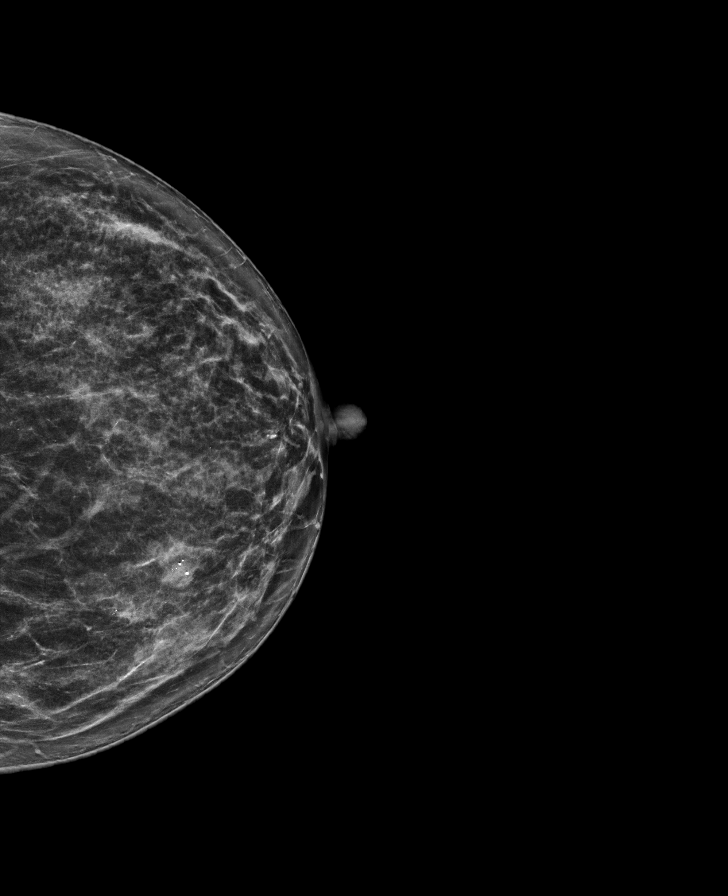

[R CC synth-2D]
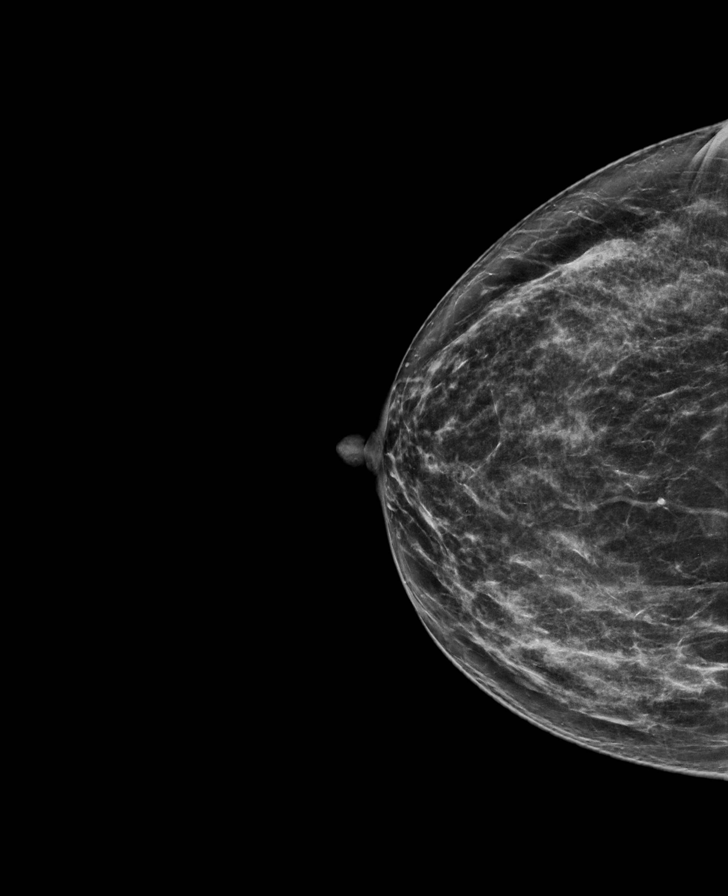

[L MLO synth-2D]
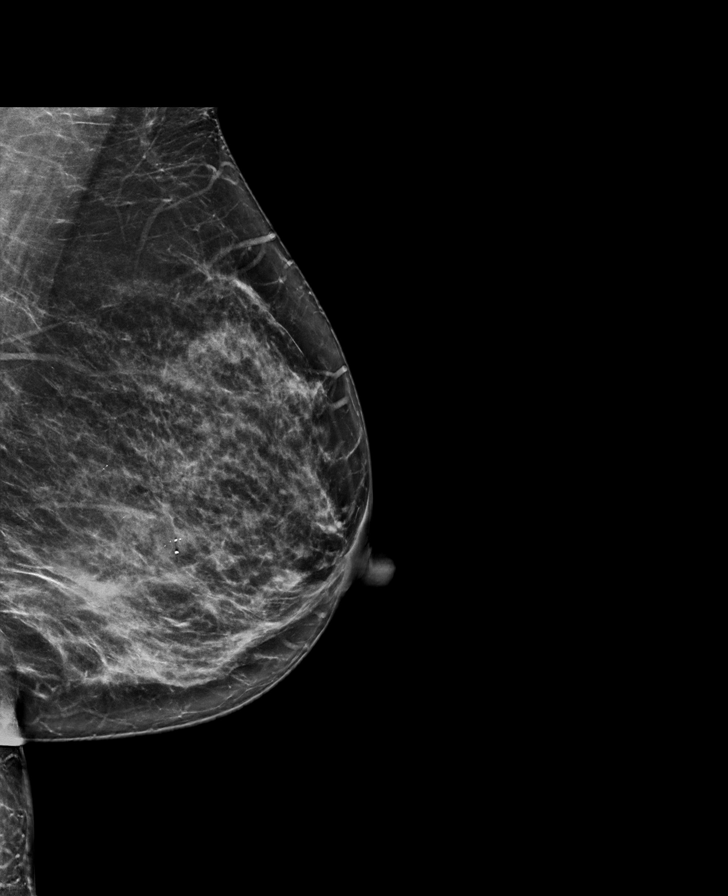

[R CC tomo · tomo slice 37/74.0]
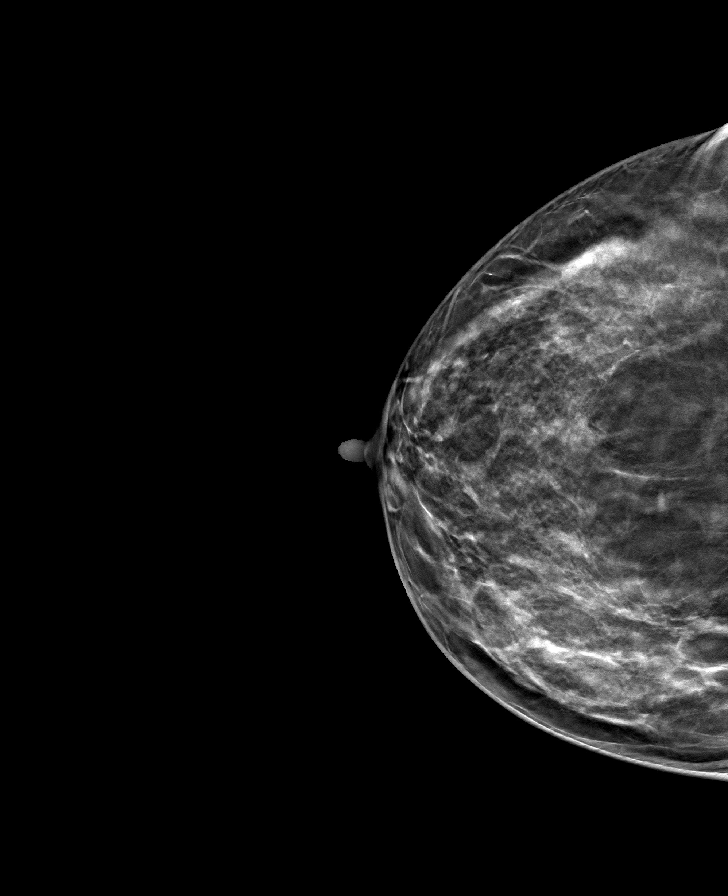

[L MLO tomo · tomo slice 37/74.0]
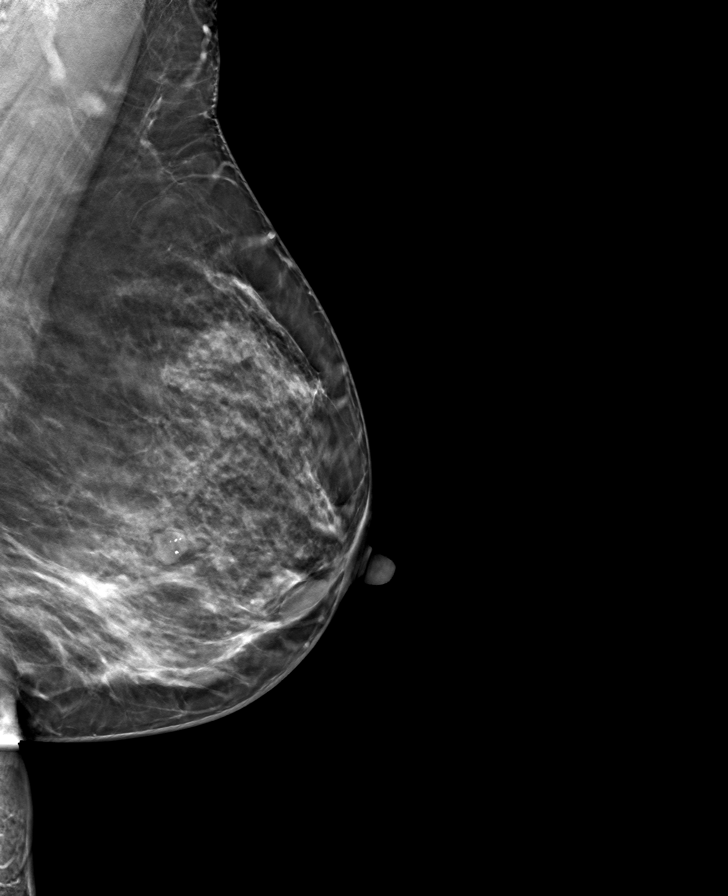

[L CC tomo · tomo slice 35/68.0]
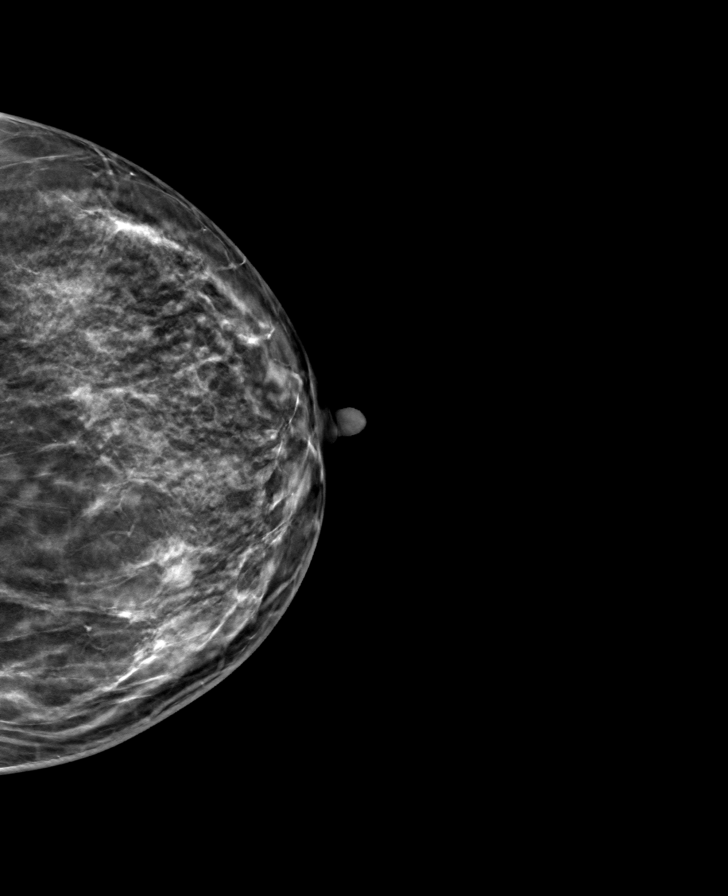

[R MLO tomo · tomo slice 39/78.0]
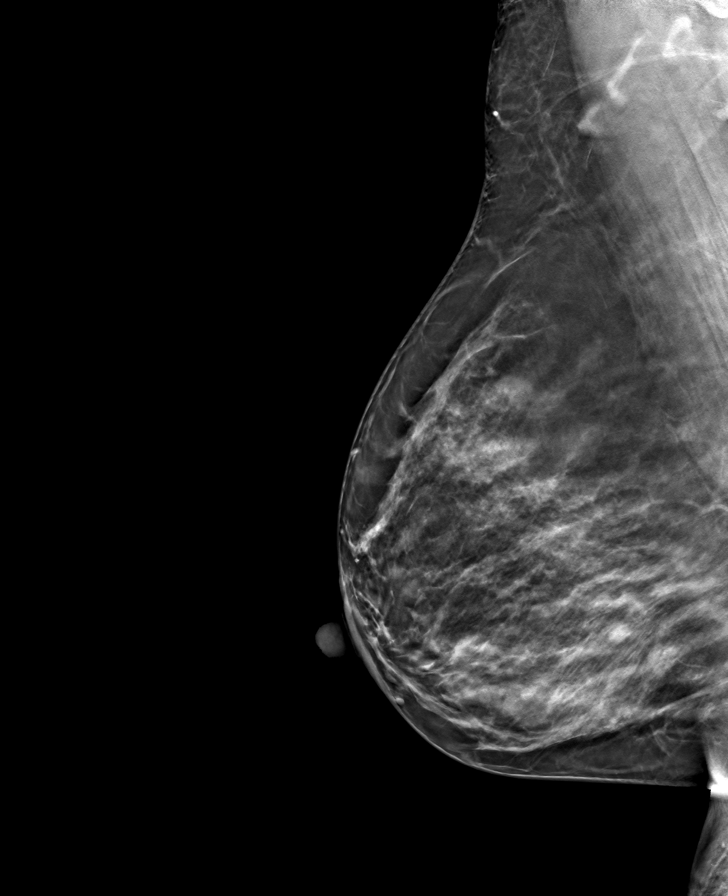

[8 of 24 positions shown; findings below may reference images not displayed]

ACR Breast Density Category c: The breast tissue is heterogeneously
dense, which may obscure small masses.
FINDINGS: In the left breast, possible masses warrant further evaluation. In
the right breast, no findings suspicious for malignancy.
IMPRESSION: Further evaluation is suggested for a possible masses in the left
breast.

RECOMMENDATION:
Diagnostic mammogram and possibly ultrasound of the left breast.
(Code:[29])

The patient will be contacted regarding the findings, and additional
imaging will be scheduled.

BI-RADS CATEGORY  0: Incomplete. Need additional imaging evaluation
and/or prior mammograms for comparison.

## 2021-11-24 ENCOUNTER — Other Ambulatory Visit: Payer: Self-pay | Admitting: Family Medicine

## 2021-11-24 ENCOUNTER — Emergency Department (HOSPITAL_COMMUNITY): Payer: Medicaid Other

## 2021-11-24 ENCOUNTER — Encounter (HOSPITAL_COMMUNITY): Payer: Self-pay | Admitting: Emergency Medicine

## 2021-11-24 ENCOUNTER — Emergency Department (HOSPITAL_COMMUNITY)
Admission: EM | Admit: 2021-11-24 | Discharge: 2021-11-24 | Disposition: A | Discharge status: Home / Self Care | Payer: Medicaid Other | Attending: Emergency Medicine | Admitting: Emergency Medicine

## 2021-11-24 ENCOUNTER — Other Ambulatory Visit: Payer: Self-pay

## 2021-11-24 DIAGNOSIS — I10 Essential (primary) hypertension: Secondary | ICD-10-CM | POA: Insufficient documentation

## 2021-11-24 DIAGNOSIS — I2510 Atherosclerotic heart disease of native coronary artery without angina pectoris: Secondary | ICD-10-CM | POA: Diagnosis not present

## 2021-11-24 DIAGNOSIS — R079 Chest pain, unspecified: Secondary | ICD-10-CM | POA: Diagnosis present

## 2021-11-24 DIAGNOSIS — Z7902 Long term (current) use of antithrombotics/antiplatelets: Secondary | ICD-10-CM | POA: Diagnosis not present

## 2021-11-24 DIAGNOSIS — R928 Other abnormal and inconclusive findings on diagnostic imaging of breast: Secondary | ICD-10-CM

## 2021-11-24 DIAGNOSIS — Z951 Presence of aortocoronary bypass graft: Secondary | ICD-10-CM | POA: Diagnosis not present

## 2021-11-24 DIAGNOSIS — Z794 Long term (current) use of insulin: Secondary | ICD-10-CM | POA: Insufficient documentation

## 2021-11-24 DIAGNOSIS — F17210 Nicotine dependence, cigarettes, uncomplicated: Secondary | ICD-10-CM | POA: Insufficient documentation

## 2021-11-24 DIAGNOSIS — R0602 Shortness of breath: Secondary | ICD-10-CM | POA: Diagnosis not present

## 2021-11-24 DIAGNOSIS — Z79899 Other long term (current) drug therapy: Secondary | ICD-10-CM | POA: Diagnosis not present

## 2021-11-24 DIAGNOSIS — E119 Type 2 diabetes mellitus without complications: Secondary | ICD-10-CM | POA: Diagnosis not present

## 2021-11-24 DIAGNOSIS — R0600 Dyspnea, unspecified: Secondary | ICD-10-CM

## 2021-11-24 DIAGNOSIS — Z720 Tobacco use: Secondary | ICD-10-CM

## 2021-11-24 LAB — BASIC METABOLIC PANEL
Anion gap: 9 (ref 5–15)
BUN: 17 mg/dL (ref 6–20)
CO2: 23 mmol/L (ref 22–32)
Calcium: 9.7 mg/dL (ref 8.9–10.3)
Chloride: 106 mmol/L (ref 98–111)
Creatinine, Ser: 1.17 mg/dL — ABNORMAL HIGH (ref 0.44–1.00)
GFR, Estimated: 58 mL/min — ABNORMAL LOW (ref >60)
Glucose, Bld: 215 mg/dL — ABNORMAL HIGH (ref 70–99)
Potassium: 3.9 mmol/L (ref 3.5–5.1)
Sodium: 138 mmol/L (ref 135–145)

## 2021-11-24 LAB — TROPONIN I (HIGH SENSITIVITY)
Troponin I (High Sensitivity): 5 ng/L (ref <18)
Troponin I (High Sensitivity): 4 ng/L (ref <18)

## 2021-11-24 LAB — D-DIMER, QUANTITATIVE: D-Dimer, Quant: <0.27 ug/mL-FEU (ref 0.00–0.50)

## 2021-11-24 LAB — CBC
HCT: 38.7 % (ref 36.0–46.0)
Hemoglobin: 12.5 g/dL (ref 12.0–15.0)
MCH: 31.9 pg (ref 26.0–34.0)
MCHC: 32.3 g/dL (ref 30.0–36.0)
MCV: 98.7 fL (ref 80.0–100.0)
Platelets: 201 10*3/uL (ref 150–400)
RBC: 3.92 MIL/uL (ref 3.87–5.11)
RDW: 14 % (ref 11.5–15.5)
WBC: 12.5 10*3/uL — ABNORMAL HIGH (ref 4.0–10.5)
nRBC: 0 % (ref 0.0–0.2)

## 2021-11-24 IMAGING — DX DG CHEST 2V
2 series · 2 of 2 positions shown · non-contrast
Comparison: Previous studies including the examination of
[DATE]

CLINICAL DATA: Chest pain

EXAM:
CHEST - 2 VIEW

[chest ap]
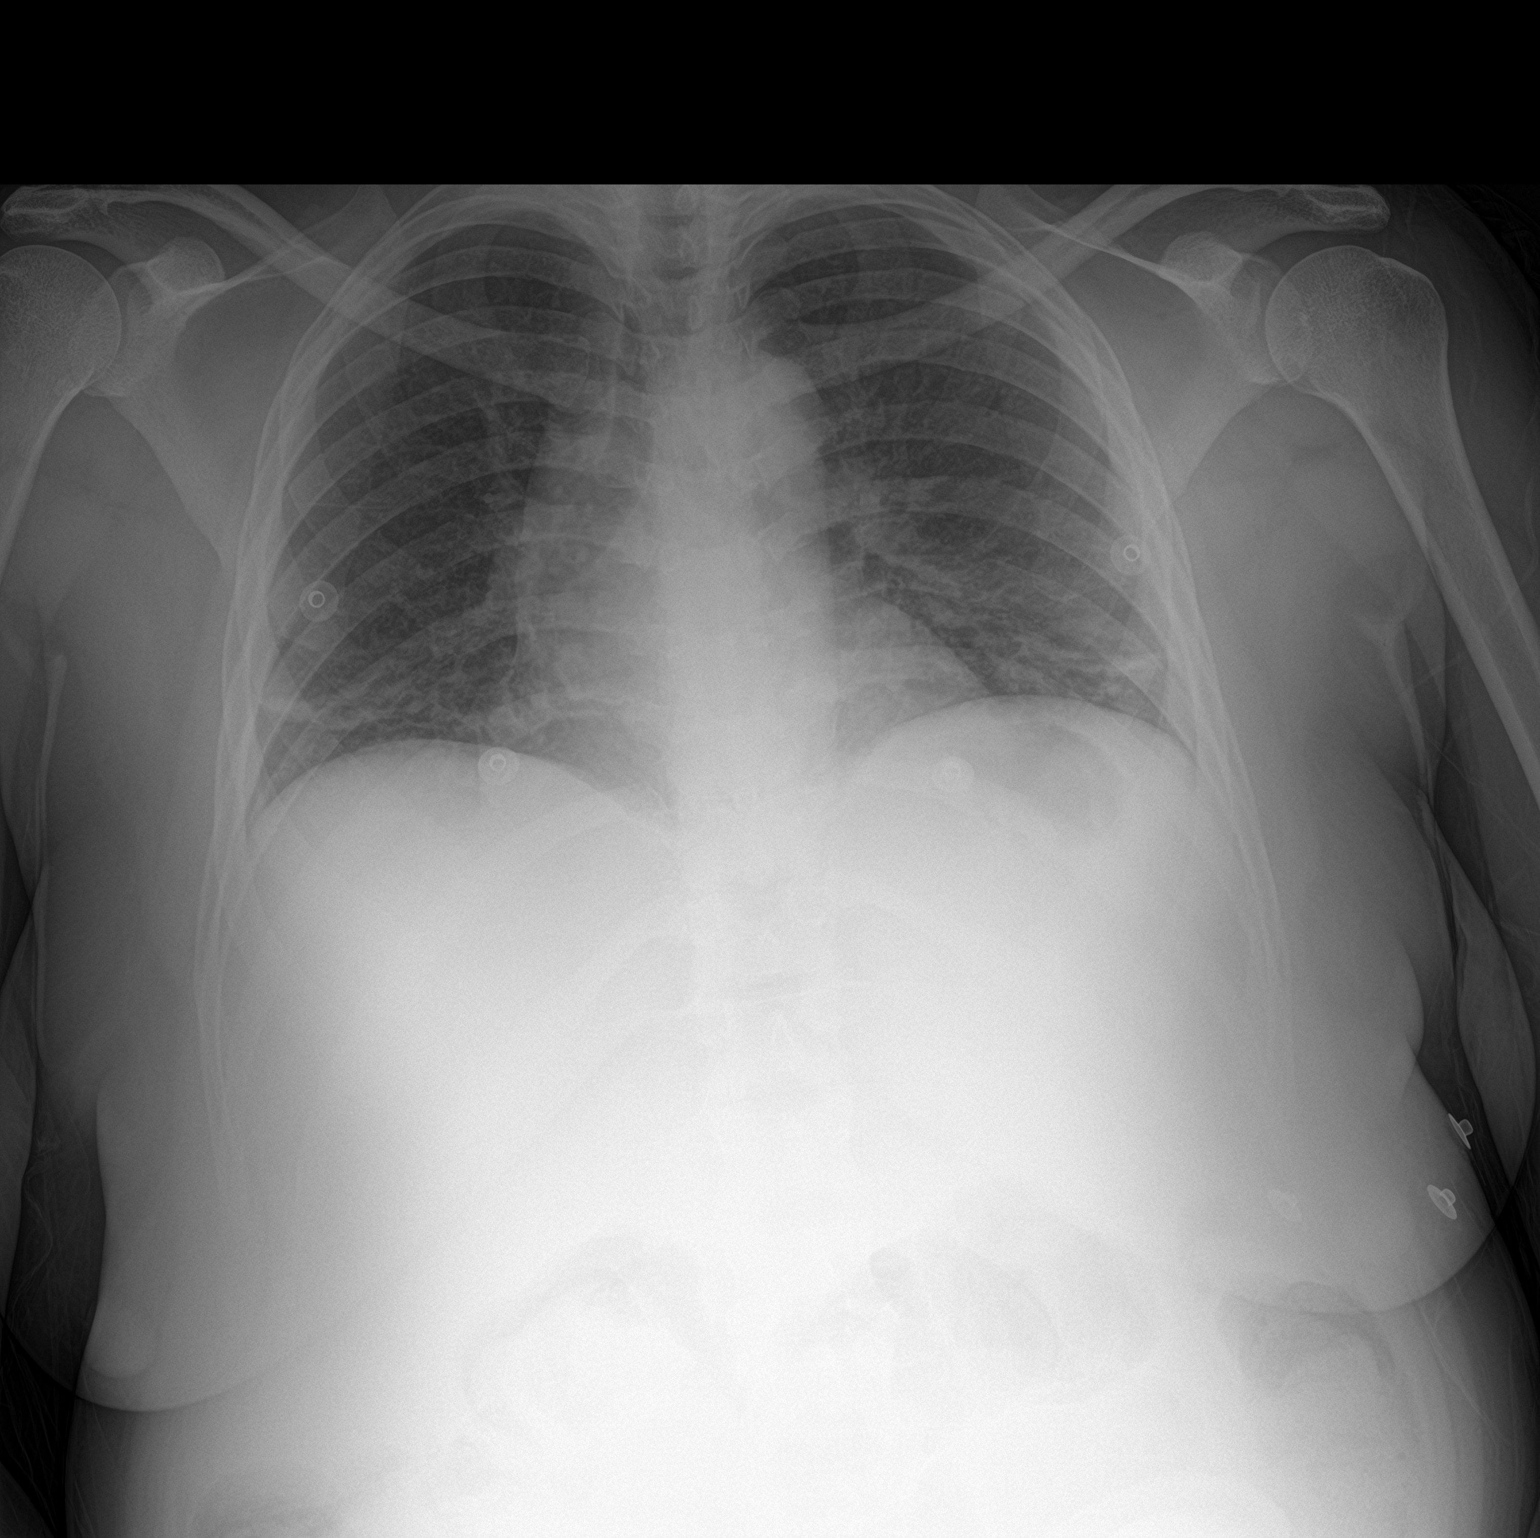

[chest lat]
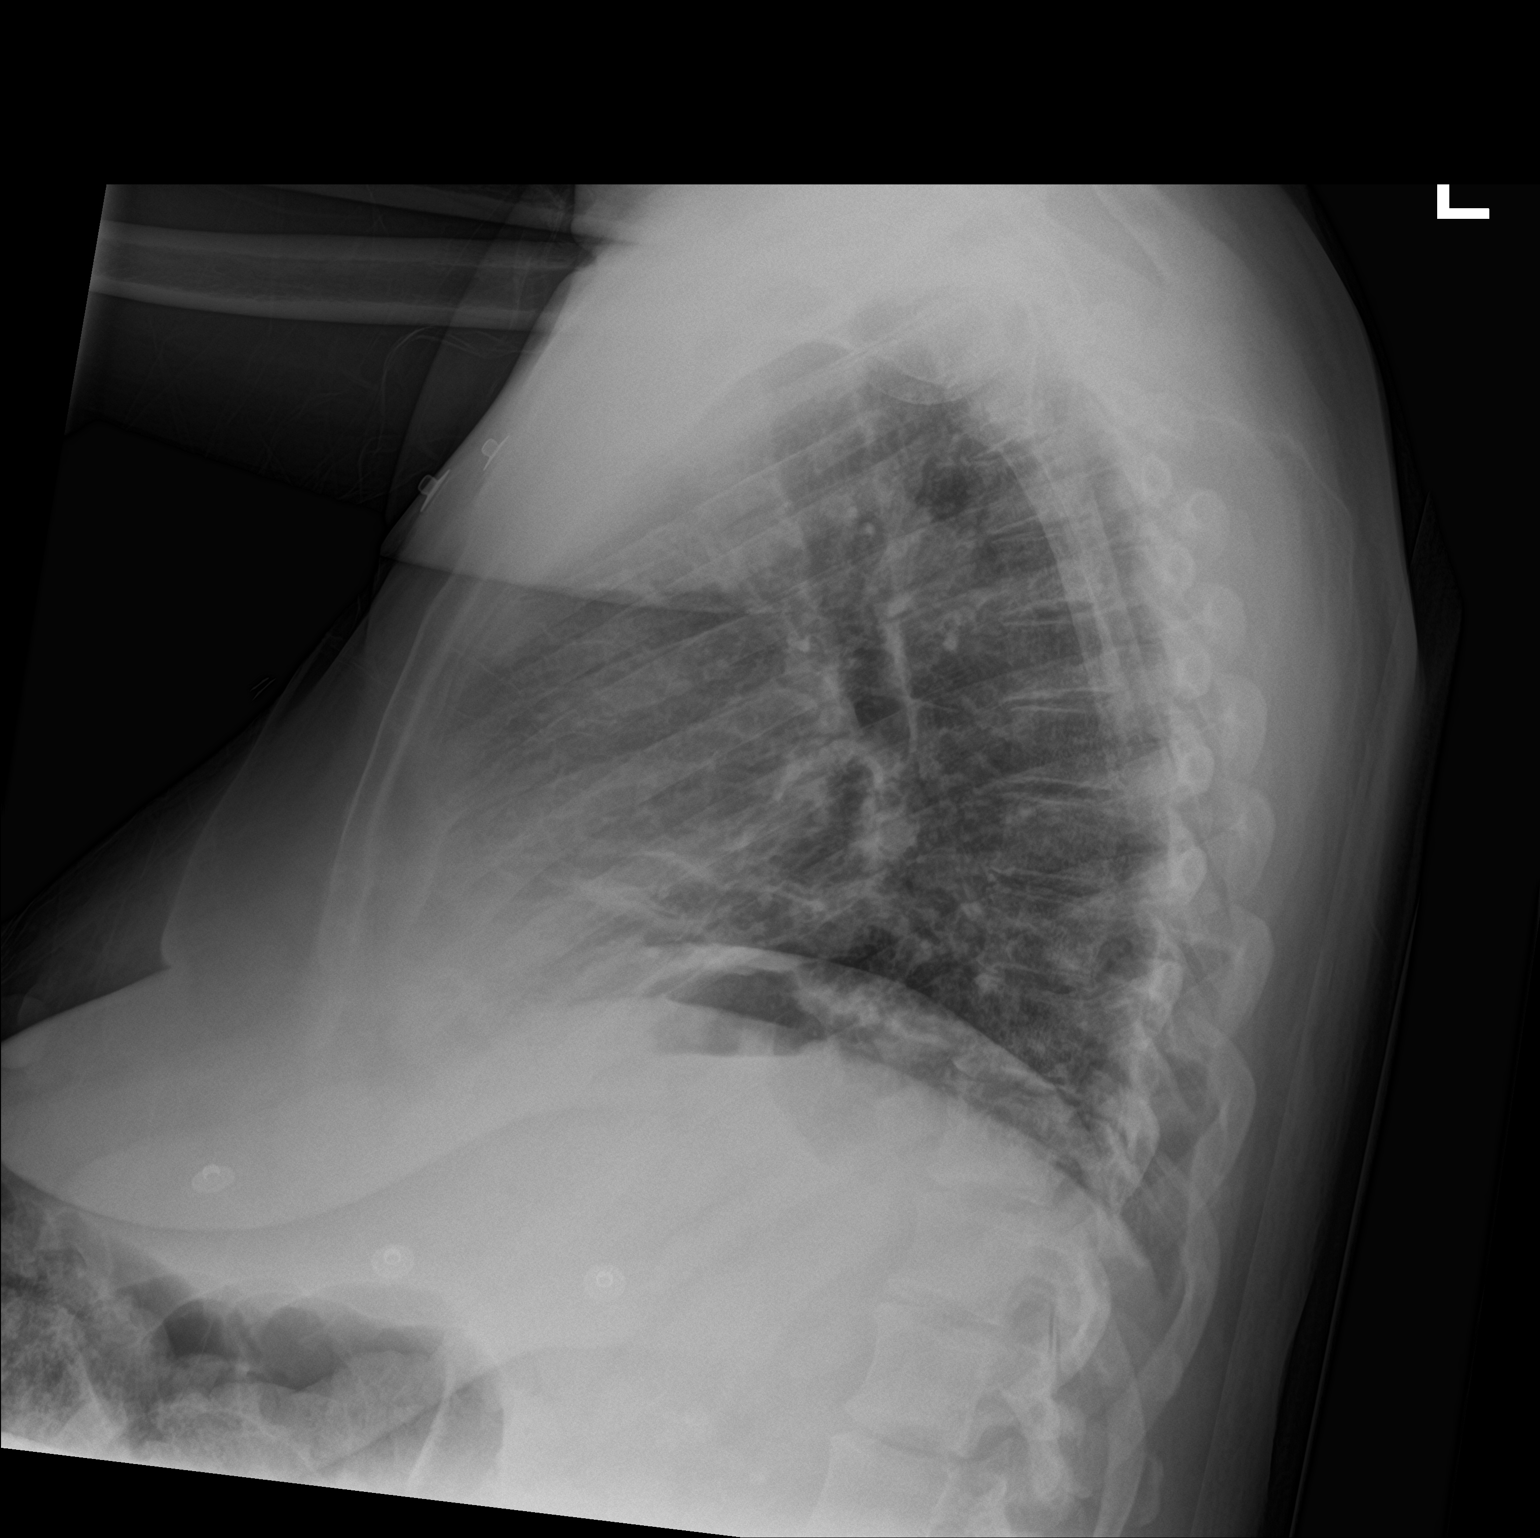

[2 of 2 positions shown; findings below may reference images not displayed]

FINDINGS: Is poor inspiration. Cardiac size is within normal limits. There are
transverse linear densities in both lower lung fields which have not
changed significantly suggesting scarring. There are no new
infiltrates or signs of pulmonary edema. There is no pleural
effusion or pneumothorax.
IMPRESSION: Transverse linear densities in both lower lung fields have not
changed significantly suggesting scarring. Poor inspiration. There
are no signs of pulmonary edema or new focal pulmonary
consolidation.

## 2021-11-24 MED ORDER — IPRATROPIUM-ALBUTEROL 0.5-2.5 (3) MG/3ML IN SOLN
3.0000 mL | Freq: Once | RESPIRATORY_TRACT | Status: AC
  Administered 2021-11-24: 3 mL via RESPIRATORY_TRACT
  Filled 2021-11-24: qty 3

## 2021-11-24 MED ORDER — NITROGLYCERIN 0.4 MG SL SUBL: 0.4000 mg | SUBLINGUAL_TABLET | SUBLINGUAL | Status: DC | PRN

## 2021-11-24 MED ORDER — GUAIFENESIN ER 600 MG PO TB12
600.0000 mg | ORAL_TABLET | Freq: Two times a day (BID) | ORAL | 0 refills | Status: AC
Start: 2021-11-24 — End: 2021-11-29

## 2021-11-24 MED ORDER — PREDNISONE 20 MG PO TABS: 40.0000 mg | ORAL_TABLET | Freq: Every day | ORAL | 0 refills | Status: AC

## 2021-11-24 MED ORDER — METHYLPREDNISOLONE SODIUM SUCC 125 MG IJ SOLR
80.0000 mg | Freq: Once | INTRAMUSCULAR | Status: AC
  Administered 2021-11-24: 80 mg via INTRAVENOUS
  Filled 2021-11-24: qty 2

## 2021-11-24 NOTE — ED Triage Notes (Signed)
Per [facility] pt coming from home c/o chest pain onset of last night. Took 3 nitro this morning with minor relief then pain would return. 324 aspirin given by EMS.  ?

## 2021-11-24 NOTE — ED Provider Notes (Signed)
?[facility] EMERGENCY DEPARTMENT ?Provider Note ? ? ?CSN: [id number] ?Arrival date & time: 11/24/21  1058 ? ?  ? ?History ? ?Chief Complaint  ?Patient presents with  ? Chest Pain  ? ? ?Maria Harper is a 51 y.o. female. ? ? Patient as above with significant medical history as below, including CAD, HLD, HTN, CVA w/ right residual deficit who presents to the ED with complaint of chest pain.  Patient does have recurrent chest pain, angina typically relieved by nitroglycerin.  Chest pain began last night around 8 PM, at rest.  Pain initially was constant but is since become more intermittent.  Associate with dyspnea.  Pain is not worsened with movement or deep inspiration.  No coughing, no fevers or chills.  She did take a nitroglycerin just prior to arrival which did improve her chest pain.  Currently symptomatic.  She has been compliant with her medications.  No cocaine use.  Follows with Dr. Herbie [place] cardiology ? ?She typically does have this chest pain intermittently, has not significantly worsened from her prior episodes.  Pain left-sided, some radiation to left axilla.  No nausea, vomiting, diaphoresis or lightheadedness ? ? ?Past Medical History: ?No date: Acid reflux ?No date: Cocaine abuse (HCC) ?2017: Coronary artery disease, occlusive ?    Comment:  2017 or 2018-PCI to LAD (mid-distal); -> anterior STEMI  ?             April 2021 -> LAD stent patent, however distal LAD at D3  ?             occluded, only able to reduce to 70% stenosis.  Small  ?             caliber RPDA with 80% stenosis. ?No date: Diabetes (HCC) ?04/2016: Heart attack (HCC) ?No date: High cholesterol ?No date: Hypertension ?11/2019: Ischemic cardiomyopathy ?No date: Neuropathic pain ?03/2020: Stroke [facility]) ?No date: Tobacco abuse ? ?Past Surgical History: ?11/23/2019: CORONARY/GRAFT ACUTE MI REVASCULARIZATION; N/A ?    Comment:  Procedure: CORONARY/GRAFT ACUTE MI REVASCULARIZATION;   ?             Surgeon: [name], MD;  Location: [facility] INVASIVE CV  ?             LAB;  Service: Cardiovascular; unsuccessful PTCA of  ?             distal LAD 100%.  O able to restore only minimal flow  ?             with 70% stenosis ?11/23/2019: LEFT HEART CATH AND CORONARY ANGIOGRAPHY; N/A ?    Comment:  Procedure: LEFT HEART CATH AND CORONARY ANGIOGRAPHY;   ?             Surgeon: [name], MD;  Location: [facility] INVASIVE CV  ?             LAB;  Service: Cardiovascular; ANTERIOR STEMI: Widely  ?             patent mid to distal LAD stent, distal LAD is 100%  ?             stenosed associated with the third diagonal.  (Post PTCA  ?             - 70% residual but 100% D3).  RPDA tapers to a small  ?             vessel -80% stenosis.  Severely  elevated LVEDP ?08/27/2020: NM MYOVIEW LTD ?    Comment:  (recurrent chest pain): Poor study due to gut  ?             attenuation.  EF ~50-55%.  No EKG changes.  MLSTLY FIXED  ?             LARGE/SEVRE perfusion defect in the basal-apical  ?             inferior, mid inferolateral & mid-apical anterior /  ?             apical walls: Findings are c/w known anatomy (apical LAD  ?             occlusion & PDA 80%, with gut attenuation enhancing  ?             inferior involvement -> but Echo with normal EF & no RWA  ?             - contradicts this finding. ?11/24/2019: TRANSTHORACIC ECHOCARDIOGRAM ?    Comment:  -post anterior STEMI: EF mildly reduced 45 to 50%.  GR 1 ?             DD.  Elevated LAP.  No obvious heart WMA.   ?             Moderate-severe AI with mild-moderate Aortic Sclerosis  ?             but no Stenosis. ?03/23/2020: TRANSTHORACIC ECHOCARDIOGRAM ?    Comment:  (stroke evaluation): EF improved to 55 to 60%.   ?             Significant LVH-notable apical.  Consider cardiac MRI.   ?             Moderate concentric LVH.  GR 1 DD.  Elevated LAP.  Mild  ?             to moderate Aortic Sclerosis but no Stenosis.  Moderate  ?             AI.   ? ? ?The history is provided by the patient and the  spouse. No language interpreter was used.  ?Chest Pain ?Associated symptoms: shortness of breath   ?Associated symptoms: no abdominal pain, no cough, no dysphagia, no fever, no headache, no nausea, no palpitations and no vomiting   ? ?HPI: A 51 year old patient with a history of CVA, treated diabetes, hypertension and hypercholesterolemia presents for evaluation of chest pain. Initial onset of pain was more than 6 hours ago. The patient's chest pain is sharp, is not worse with exertion and is relieved by nitroglycerin. The patient's chest pain is middle- or left-sided, is not well-localized, is not described as heaviness/pressure/tightness and does not radiate to the arms/jaw/neck. The patient does not complain of nausea and denies diaphoresis. The patient has smoked in the past 90 days. The patient has no history of peripheral artery disease, has no relevant family history of coronary artery disease (first degree relative at less than age 70) and does not have an elevated BMI (>=30).  ? ?Home Medications ?Prior to Admission medications   ?Medication Sig Start Date End Date Taking? Authorizing Provider  ?guaiFENesin (MUCINEX) 600 MG 12 hr tablet Take 1 tablet (600 mg total) by mouth 2 (two) times daily for 5 days. 11/24/21 11/29/21 Yes Sloan Leiter, DO  ?predniSONE (DELTASONE) 20 MG tablet Take 2 tablets (40 mg total) by mouth daily for 5  days. 11/24/21 11/29/21 Yes Sloan Leiter, DO  ?acetaminophen (TYLENOL) 500 MG tablet Take 1,000 mg by mouth in the morning, at noon, and at bedtime. 09/23/20   [provider]  ?amLODipine (NORVASC) 2.5 MG tablet TAKE 1 Tablet BY MOUTH ONCE DAILY 11/04/21   [name], MD  ?atorvastatin (LIPITOR) 80 MG tablet TAKE 1 Tablet BY MOUTH ONCE DAILY 11/04/21   [name], MD  ?carbamazepine (CARBATROL) 100 MG 12 hr capsule Take 1 capsule (100 mg total) by mouth 2 (two) times daily. 11/19/21   Penumalli, Glenford Bayley, MD  ?carvedilol (COREG) 25 MG tablet TAKE 1 Tablet BY MOUTH  TWICE DAILY with a meal 11/04/21   [name], MD  ?CLARITIN 10 MG tablet Take 10 mg by mouth daily. 01/13/21   [provider]  ?clopidogrel (PLAVIX) 75 MG tablet TAKE 1 Tablet BY MOUTH ONCE DAILY 11/04/21   [name], MD  ?cyclobenzaprine (FLEXERIL) 10 MG tablet Take 10 mg by mouth 3 (three) times daily as needed for muscle spasms.     [provider]  ?doxepin (SINEQUAN) 50 MG capsule Take 50 mg by mouth in the morning, at noon, and at bedtime.    [provider]  ?empagliflozin (JARDIANCE) 10 MG TABS tablet Take 1 tablet (10 mg total) by mouth daily before breakfast. KEEP OV. 09/29/21   [name], MD  ?Evolocumab (REPATHA SURECLICK) 140 MG/ML SOAJ Inject 140 mg into the skin every 14 (fourteen) days. KEEP OV. 09/29/21   [name], MD  ?gabapentin (NEURONTIN) 300 MG capsule Take 3 capsules (900 mg total) by mouth 3 (three) times daily. 08/31/21   Penumalli, Glenford Bayley, MD  ?Insulin Glargine Solostar (LANTUS) 100 UNIT/ML Solostar Pen Inject 30 Units into the skin daily. 09/25/21   [provider]  ?insulin lispro (HUMALOG) 100 UNIT/ML KwikPen Inject 10 Units into the skin in the morning, at noon, and at bedtime. 09/25/21   [provider]  ?ipratropium (ATROVENT HFA) 17 MCG/ACT inhaler Inhale 2 puffs into the lungs every 6 (six) hours.    [provider]  ?isosorbide mononitrate (IMDUR) 60 MG 24 hr tablet TABLET 1 AND 1/2 tablets BY MOUTH EVERY MORNING AND 1/2 tablet AT BEDTIME 11/04/21   [name], MD  ?naproxen (NAPROSYN) 375 MG tablet Take 1 tablet (375 mg total) by mouth 2 (two) times daily. 08/17/21   Kommor, [place], MD  ?nicotine (NICODERM CQ - DOSED IN MG/24 HOURS) 14 mg/24hr patch Place 1 patch (14 mg total) onto the skin daily. 06/18/21   Marjie Skiff E, PA-C  ?nitroGLYCERIN (NITROSTAT) 0.4 MG SL tablet DISSOLVE 1 TABLET UNDER THE TONGUE EVERY 5 MINUTES AS NEEDED FOR CHEST PAIN. DO NOT EXCEED A TOTAL OF 3 DOSES IN 15 MINUTES.  CALL 911 IF NO RELIEF. 10/08/21   [name], MD  ?pantoprazole (PROTONIX) 40 MG tablet Take 1 tablet (40 mg total) by mouth 2 (two) times daily. KEEP OV. 09/29/21   [name], MD  ?Gracy Bruins

## 2021-11-24 NOTE — ED Provider Triage Note (Signed)
Emergency Medicine Provider Triage Evaluation Note ? ?Maria Harper , a 51 y.o. female  was evaluated in triage.  Pt complains of CP onset last night, took 3 nitro this AM, some improvement was still 9/10. Located under left breast, radiates to left arm with mild SHOB. ?Has CP regularly, scheduled to see her cardiologist Friday ?Review of Systems  ?Positive: CP, SHOB ?Negative: vomiting ? ?Physical Exam  ?SpO2 95%  ?Gen:   Awake, no distress   ?Resp:  Normal effort  ?MSK:   Moves extremities without difficulty  ?Other:  Appears comfortable, no distress  ? ?Medical Decision Making  ?Medically screening exam initiated at 11:09 AM.  Appropriate orders placed.  Maria Harper was informed that the remainder of the evaluation will be completed by another provider, this initial triage assessment does not replace that evaluation, and the importance of remaining in the ED until their evaluation is complete. ? ? ?  ?Tacy Learn, PA-C ?11/24/21 1111 ? ?

## 2021-11-24 NOTE — Discharge Instructions (Signed)
It was a pleasure caring for you today in the emergency department.  Please return to the emergency department for any worsening or worrisome symptoms.   Return to the Emergency Department if you have unusual chest pain, pressure, or discomfort, shortness of breath, nausea, vomiting, burping, heartburn, tingling upper body parts, sweating, cold, clammy skin, or racing heartbeat. Call 911 if you think you are having a heart attack. Take all cardiac medications as prescribed - notify your doctor if you have any side effects. Follow cardiac diet - avoid fatty & fried foods, don't eat too much red meat, eat lots of fruits & vegetables, and dairy products should be low fat. Please lose weight if you are overweight. Become more active with walking, gardening, or any other activity that gets you to moving.   Please return to the emergency department immediately for any new or concerning symptoms, or if you get worse.  

## 2021-11-27 ENCOUNTER — Ambulatory Visit (INDEPENDENT_AMBULATORY_CARE_PROVIDER_SITE_OTHER): Payer: Medicaid Other | Admitting: Physician Assistant

## 2021-11-27 ENCOUNTER — Encounter: Payer: Self-pay | Admitting: Physician Assistant

## 2021-11-27 VITALS — BP 122/90 | HR 83 | Ht 62.0 in | Wt 167.6 lb

## 2021-11-27 DIAGNOSIS — Z01818 Encounter for other preprocedural examination: Secondary | ICD-10-CM | POA: Diagnosis not present

## 2021-11-27 DIAGNOSIS — E119 Type 2 diabetes mellitus without complications: Secondary | ICD-10-CM

## 2021-11-27 DIAGNOSIS — E785 Hyperlipidemia, unspecified: Secondary | ICD-10-CM

## 2021-11-27 DIAGNOSIS — Z8673 Personal history of transient ischemic attack (TIA), and cerebral infarction without residual deficits: Secondary | ICD-10-CM

## 2021-11-27 DIAGNOSIS — I10 Essential (primary) hypertension: Secondary | ICD-10-CM | POA: Diagnosis not present

## 2021-11-27 DIAGNOSIS — I25119 Atherosclerotic heart disease of native coronary artery with unspecified angina pectoris: Secondary | ICD-10-CM

## 2021-11-27 NOTE — Progress Notes (Signed)
?Cardiology Office Note:   ? ?Date:  11/29/2021  ? ?ID:  CULA JASS, DOB Jul 25, 1971, MRN [id number] ? ?PCP:  Vassie Moment, MD ?  ?[facility] Providers ?Cardiologist:  Glenetta Hew, MD    ? ?Referring MD: Marliss Coots, NP  ? ?Chief Complaint  ?Patient presents with  ? Chest Pain  ?  Seen for Dr. Ellyn Hack  ? ? ?History of Present Illness:   ? ?Maria Harper is a 51 y.o. female with a hx of CAD s/p DES to LAD 2018 with anterior STEMI 11/2019, ICM with normalization of EF to 55-60% in 03/2020, CVA in August 2021, hypertension, hyperlipidemia, DM 2, cocaine abuse and tobacco abuse.  Patient underwent DES to LAD in 2018 in [place].  She then presented with anterior STEMI in April 2021.  Previous LAD stent was patent, however did have poor 100% stenosis in distal LAD and 100% stenosis in the sidebranch of third diagonal.  PTCA was performed and was able to reduce the LAD lesion down to 70% but was unable to cross the third diagonal lesion.  Patient also had 80% stenosis in RPDA that was too small for PCI.  Echocardiogram showed EF 45 to 50%, normal wall motion, grade 1 DD, moderate to severe AI.  She was started Imdur and carvedilol.  Patient was readmitted in August 2021 with strokelike symptoms.  Brain MRI showed small focus of acute/subacute ischemia within the left thalamus.  Echocardiogram showed EF 55 to 60%, significant LVH predominantly in the apical segment, grade 1 DD, mild MR, moderate AI. ? ?During previous office visit in December 2021, she continued to have intermittent chest discomfort relieved by nitroglycerin.  Myoview was ordered however was a very poor study due to severe extracardiac tracer uptake in the gut making imaging essentially uninterpretable, there was a fixed defect in the anterior apical wall and apex which is related to her known anatomy.  Dr. Ellyn Hack felt the symptom was probably due to coronary spasm and microvascular disease.  Unfortunately of heart disease distal  and not revascularizable.  Patient was seen in the ED in August 2022 with chest pain while participating in a sleep study.  Serial troponin negative x2.  D-dimer negative. ? ?Patient was last seen by [name] on 06/18/2021 along with her husband, she continued to have intermittent chest discomfort that occurs randomly that occurs about 2-3 times per week.  Imdur was increased to 90 mg a.m. and 30 mg p.m.  More recently, patient presented to the hospital on 11/24/2021, chest discomfort was felt to be sharp.  She was treated with guaifenesin and prednisone. ? ?Patient presents today along with her husband.  Recent chest pain is quite atypical which is described as a sharp shooting pain down the left anterior chest.  This is different from her previous angina.  Her chest pain is not worse with physical exertion.  I would not recommend any further ischemic work-up at this time given the atypical nature of her symptoms.  She is cleared to proceed with upcoming colonoscopy procedure.  She may hold Plavix for 5 days prior to the procedure and restart as soon as possible afterward at the surgeon's discretion.  Overall, she is stable from the cardiac perspective and can follow-up in 5 to 6 months. ? ?Past Medical History:  ?Diagnosis Date  ? Acid reflux   ? Cocaine abuse ([place])   ? Coronary artery disease, occlusive 2017  ? 2017 or 2018-PCI to LAD (mid-distal); -> anterior  STEMI April 2021 -> LAD stent patent, however distal LAD at D3 occluded, only able to reduce to 70% stenosis.  Small caliber RPDA with 80% stenosis.  ? Diabetes ([place])   ? Heart attack ([place]) 04/2016  ? High cholesterol   ? Hypertension   ? Ischemic cardiomyopathy 11/2019  ? Neuropathic pain   ? Stroke [facility]) 03/2020  ? Tobacco abuse   ? ? ?Past Surgical History:  ?Procedure Laterality Date  ? CORONARY/GRAFT ACUTE MI REVASCULARIZATION N/A 11/23/2019  ? Procedure: CORONARY/GRAFT ACUTE MI REVASCULARIZATION;  Surgeon: Leonie Man, MD;  Location: [place] CV LAB;  Service: Cardiovascular; unsuccessful PTCA of distal LAD 100%.  O able to restore only minimal flow with 70% stenosis  ? LEFT HEART CATH AND CORONARY ANGIOGRAPHY N/A 11/23/2019  ? Procedure: LEFT HEART CATH AND CORONARY ANGIOGRAPHY;  Surgeon: Leonie Man, MD;  Location: [place] CV LAB;  Service: Cardiovascular; ANTERIOR STEMI: Widely patent mid to distal LAD stent, distal LAD is 100% stenosed associated with the third diagonal.  (Post PTCA - 70% residual but 100% D3).  RPDA tapers to a small vessel -80% stenosis.  Severely elevated LVEDP  ? NM MYOVIEW LTD  08/27/2020  ? (recurrent chest pain): Poor study due to gut attenuation.  EF ~50-55%.  No EKG changes.  MLSTLY FIXED LARGE/SEVRE perfusion defect in the basal-apical inferior, mid inferolateral & mid-apical anterior / apical walls: Findings are c/w known anatomy (apical LAD occlusion & PDA 80%, with gut attenuation enhancing inferior involvement -> but Echo with normal EF & no RWA - contradicts this finding.  ? TRANSTHORACIC ECHOCARDIOGRAM  11/24/2019  ? -post anterior STEMI: EF mildly reduced 45 to 50%.  GR 1 DD.  Elevated LAP.  No obvious heart WMA.  Moderate-severe AI with mild-moderate Aortic Sclerosis but no Stenosis.  ? TRANSTHORACIC ECHOCARDIOGRAM  03/23/2020  ? (stroke evaluation): EF improved to 55 to 60%.  Significant LVH-notable apical.  Consider cardiac MRI.  Moderate concentric LVH.  GR 1 DD.  Elevated LAP.  Mild to moderate Aortic Sclerosis but no Stenosis.  Moderate AI.   ? ? ?Current Medications: ?Current Meds  ?Medication Sig  ? atorvastatin (LIPITOR) 80 MG tablet TAKE 1 Tablet BY MOUTH ONCE DAILY  ? buPROPion ER (WELLBUTRIN SR) 100 MG 12 hr tablet Take 100 mg by mouth 2 (two) times daily.  ? carbamazepine (CARBATROL) 100 MG 12 hr capsule Take 1 capsule (100 mg total) by mouth 2 (two) times daily.  ? carvedilol (COREG) 25 MG tablet TAKE 1 Tablet BY MOUTH TWICE DAILY with a meal  ? CLARITIN 10 MG tablet Take 10 mg by  mouth daily.  ? clopidogrel (PLAVIX) 75 MG tablet TAKE 1 Tablet BY MOUTH ONCE DAILY  ? cyclobenzaprine (FLEXERIL) 10 MG tablet Take 10 mg by mouth 3 (three) times daily as needed for muscle spasms.   ? doxepin (SINEQUAN) 50 MG capsule Take 50 mg by mouth in the morning, at noon, and at bedtime.  ? empagliflozin (JARDIANCE) 10 MG TABS tablet Take 1 tablet (10 mg total) by mouth daily before breakfast. KEEP OV.  ? Evolocumab (REPATHA SURECLICK) [id number] MG/ML SOAJ Inject 140 mg into the skin every 14 (fourteen) days. KEEP OV.  ? gabapentin (NEURONTIN) 300 MG capsule Take 3 capsules (900 mg total) by mouth 3 (three) times daily.  ? glipiZIDE (GLUCOTROL) 5 MG tablet Take 5 mg by mouth 2 (two) times daily.  ? guaiFENesin (MUCINEX) 600 MG 12 hr tablet Take 1 tablet (600  mg total) by mouth 2 (two) times daily for 5 days.  ? ibuprofen (ADVIL) 800 MG tablet Take 800 mg by mouth every 6 (six) hours as needed.  ? Insulin Glargine Solostar (LANTUS) 100 UNIT/ML Solostar Pen Inject 35 Units into the skin daily.  ? insulin lispro (HUMALOG) 100 UNIT/ML KwikPen Inject 10 Units into the skin in the morning, at noon, and at bedtime.  ? ipratropium (ATROVENT HFA) 17 MCG/ACT inhaler Inhale 2 puffs into the lungs every 6 (six) hours.  ? isosorbide mononitrate (IMDUR) 60 MG 24 hr tablet TABLET 1 AND 1/2 tablets BY MOUTH EVERY MORNING AND 1/2 tablet AT BEDTIME  ? LINZESS 145 MCG CAPS capsule Take 145 mcg by mouth daily.  ? naproxen (NAPROSYN) 375 MG tablet Take 1 tablet (375 mg total) by mouth 2 (two) times daily.  ? nitroGLYCERIN (NITROSTAT) 0.4 MG SL tablet DISSOLVE 1 TABLET UNDER THE TONGUE EVERY 5 MINUTES AS NEEDED FOR CHEST PAIN. DO NOT EXCEED A TOTAL OF 3 DOSES IN 15 MINUTES. CALL 911 IF NO RELIEF.  ? pantoprazole (PROTONIX) 40 MG tablet Take 1 tablet (40 mg total) by mouth 2 (two) times daily. KEEP OV.  ? polyethylene glycol powder (GLYCOLAX/MIRALAX) powder Take 17 g by mouth daily as needed for moderate constipation.   ? predniSONE  (DELTASONE) 20 MG tablet Take 2 tablets (40 mg total) by mouth daily for 5 days.  ? pregabalin (LYRICA) 50 MG capsule Take 50 mg by mouth 3 (three) times daily.  ? QUEtiapine (SEROQUEL) 400 MG tablet Take 400 mg

## 2021-11-27 NOTE — Patient Instructions (Signed)
Medication Instructions:  ?HOLD Plavix 5 days prior to the procedure  ? ?*If you need a refill on your cardiac medications before your next appointment, please call your pharmacy* ? ?Lab Work: ?NONE ordered at this time of appointment  ? ?If you have labs (blood work) drawn today and your tests are completely normal, you will receive your results only by: ?MyChart Message (if you have MyChart) OR ?A paper copy in the mail ?If you have any lab test that is abnormal or we need to change your treatment, we will call you to review the results. ? ?Testing/Procedures: ?NONE ordered at this time of appointment  ? ?Follow-Up: ?At [facility], you and your health needs are our priority.  As part of our continuing mission to provide you with exceptional heart care, we have created designated Provider Care Teams.  These Care Teams include your primary Cardiologist (physician) and Advanced Practice Providers (APPs -  Physician Assistants and Nurse Practitioners) who all work together to provide you with the care you need, when you need it. ? ?We recommend signing up for the patient portal called "MyChart".  Sign up information is provided on this After Visit Summary.  MyChart is used to connect with patients for Virtual Visits (Telemedicine).  Patients are able to view lab/test results, encounter notes, upcoming appointments, etc.  Non-urgent messages can be sent to your provider as well.   ?To learn more about what you can do with MyChart, go to [email or web address].   ? ?Your next appointment:   ?5-6 month(s) ? ?The format for your next appointment:   ?In Person ? ?Provider:   ?Glenetta Hew, MD   ? ?Other Instructions ? ? ?Important Information About Sugar ? ? ? ? ? ? ?

## 2021-11-29 ENCOUNTER — Encounter: Payer: Self-pay | Admitting: Physician Assistant

## 2021-11-29 NOTE — Telephone Encounter (Signed)
Patient may proceed with colonoscopy procedure after holding Plavix for 5 days.  Will defer to the GI doctor to determine the earliest restarting time for the Plavix afterward. ?

## 2021-11-30 NOTE — Patient Instructions (Signed)
Below is our plan: ? ?We will check blood work today. Assuming sodium is normal, we will start carbamazepine 100mg  daily at bedtime for 1 week. If tolerating will, increase dose to 100mg  twice daily for 1 week. Return to the office for sodium check at this time. If sodium remains stable with weill continue carbamazepine 100mg  twice daily. For now, continue gabapentin 900mg  three times daily. If you wish, wean dose to 600mg  TID to determine if this is effective. If not, continue to wean as discussed by 1 capsule per dose every week. Do not take Lyrica (pregabalin) while taking gabapentin.  ? ?Please make sure you are staying well hydrated. I recommend 50-60 ounces daily. Well balanced diet and regular exercise encouraged. Consistent sleep schedule with 6-8 hours recommended.  ? ?Please continue follow up with care team as directed.  ? ?Follow up with me in 4 months ? ?You may receive a survey regarding today's visit. I encourage you to leave honest feed back as I do use this information to improve patient care. Thank you for seeing me today!  ? ? ?

## 2021-11-30 NOTE — Progress Notes (Signed)
? ? ?Chief Complaint  ?Patient presents with  ? Follow-up  ?  Rm 2, w husband. Here to f/u for failing to improve. Pt c/o of bilateral leg pn. Burning in feet's. Gabapentin 900mg  is not helping as much. Numbness on R side of body.   ? ? ?HISTORY OF PRESENT ILLNESS: ? ?12/01/21 ALL:  ?Maria Harper is a 51 y.o. female here today for follow up for pain following stroke in 03/2020. She was seen by Dr Marjory Lies 08/2021. She reported right face, arm, leg numbness, tingling and pain since stroke. She had been on gabapentin up to 600mg  TID but felt it was ineffective. She was advised to increase to 900mg  TID. Her husband called 09/17/2021 to report continued pain and multiple falls. Gabapentin had been continued at 600mg  TID and they were reminded to increase dose to 900mg  TID. He called again 11/19/2021 with no improvement. Patient was advised to add carbamazepine 100mg  BID.  ? ?Per Epic med list and PDMP review, she was started on pregabalin 50mg  TID by Dr Lazarus Salines, PCP 09/25/2021. She has filled 09/25/2021 and 10/23/2021. She reports that she discontinued this about a month ago as it was ineffective.  ? ?She continues to have burning pain/numbness of right side including face, arm, torso and leg. No specific area that is worse. She has not started carbamazepine. She did not hear back from pharmacy. Now using [facility]. She is not aware of any difficulty with low sodium levels in the past. Review of Epic shows two readings of 133 and 134 with all other readings in normal range, most recent level was 138.   ? ? ?HISTORY (copied from Dr Richrd Humbles previous note) ? ?51 year old female here for evaluation of right-sided numbness.  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  She has been on gabapentin 300 mg 3 times a day without relief.  This was increased to 600 mg 3 times a day 1 week ago, which patient took for few days and now has stopped.   Patient did not feel any benefit from gabapentin. ?  ? ? ?REVIEW OF SYSTEMS: Out of a complete 14 system review of symptoms, the patient complains only of the following symptoms, chronic pain, fatigue, anxiety, and all other reviewed systems are negative. ? ? ?ALLERGIES: ?Allergies  ?Allergen Reactions  ? Bee Venom Anaphylaxis  ? Contrast Media [Iodinated Contrast Media] Hives and Itching  ? Tomato Itching and Rash  ? ? ? ?HOME MEDICATIONS: ?Outpatient Medications Prior to Visit  ?Medication Sig Dispense Refill  ? atorvastatin (LIPITOR) 80 MG tablet TAKE 1 Tablet BY MOUTH ONCE DAILY 90 tablet 3  ? buPROPion ER (WELLBUTRIN SR) 100 MG 12 hr tablet Take 100 mg by mouth 2 (two) times daily.    ? carvedilol (COREG) 25 MG tablet TAKE 1 Tablet BY MOUTH TWICE DAILY with a meal 180 tablet 3  ? CLARITIN 10 MG tablet Take 10 mg by mouth daily.    ? clopidogrel (PLAVIX) 75 MG tablet TAKE 1 Tablet BY MOUTH ONCE DAILY 90 tablet 0  ? cyclobenzaprine (FLEXERIL) 10 MG tablet Take 10 mg by mouth 3 (three) times daily as needed for muscle spasms.     ? doxepin (SINEQUAN) 50 MG capsule Take 50 mg by mouth in the morning, at noon, and at bedtime.    ? empagliflozin (JARDIANCE) 10 MG TABS tablet Take 1 tablet (10 mg total) by mouth  daily before breakfast. KEEP OV. 90 tablet 0  ? Evolocumab (REPATHA SURECLICK) 140 MG/ML SOAJ Inject 140 mg into the skin every 14 (fourteen) days. KEEP OV. 6 mL 0  ? gabapentin (NEURONTIN) 300 MG capsule Take 3 capsules (900 mg total) by mouth 3 (three) times daily. 270 capsule 12  ? glipiZIDE (GLUCOTROL) 5 MG tablet Take 5 mg by mouth 2 (two) times daily.    ? ibuprofen (ADVIL) 800 MG tablet Take 800 mg by mouth every 6 (six) hours as needed.    ? Insulin Glargine Solostar (LANTUS) 100 UNIT/ML Solostar Pen Inject 35 Units into the skin daily.    ? insulin lispro (HUMALOG) 100 UNIT/ML KwikPen Inject 10 Units into the skin in the morning, at noon, and at bedtime.    ? ipratropium (ATROVENT HFA) 17 MCG/ACT  inhaler Inhale 2 puffs into the lungs every 6 (six) hours.    ? isosorbide mononitrate (IMDUR) 60 MG 24 hr tablet TABLET 1 AND 1/2 tablets BY MOUTH EVERY MORNING AND 1/2 tablet AT BEDTIME 180 tablet 3  ? LINZESS 145 MCG CAPS capsule Take 145 mcg by mouth daily.    ? naproxen (NAPROSYN) 375 MG tablet Take 1 tablet (375 mg total) by mouth 2 (two) times daily. 20 tablet 0  ? nitroGLYCERIN (NITROSTAT) 0.4 MG SL tablet DISSOLVE 1 TABLET UNDER THE TONGUE EVERY 5 MINUTES AS NEEDED FOR CHEST PAIN. DO NOT EXCEED A TOTAL OF 3 DOSES IN 15 MINUTES. CALL 911 IF NO RELIEF. 25 tablet PRN  ? pantoprazole (PROTONIX) 40 MG tablet Take 1 tablet (40 mg total) by mouth 2 (two) times daily. KEEP OV. 180 tablet 0  ? polyethylene glycol powder (GLYCOLAX/MIRALAX) powder Take 17 g by mouth daily as needed for moderate constipation.     ? QUEtiapine (SEROQUEL) 400 MG tablet Take 400 mg by mouth at bedtime.    ? SEROQUEL 200 MG tablet Take 200 mg by mouth daily. In the morning    ? carbamazepine (CARBATROL) 100 MG 12 hr capsule Take 1 capsule (100 mg total) by mouth 2 (two) times daily. 60 capsule 6  ? pregabalin (LYRICA) 50 MG capsule Take 50 mg by mouth 3 (three) times daily.    ? ?No facility-administered medications prior to visit.  ? ? ? ?PAST MEDICAL HISTORY: ?Past Medical History:  ?Diagnosis Date  ? Acid reflux   ? Cocaine abuse (HCC)   ? Coronary artery disease, occlusive 2017  ? 2017 or 2018-PCI to LAD (mid-distal); -> anterior STEMI April 2021 -> LAD stent patent, however distal LAD at D3 occluded, only able to reduce to 70% stenosis.  Small caliber RPDA with 80% stenosis.  ? Diabetes (HCC)   ? Heart attack (HCC) 04/2016  ? High cholesterol   ? Hypertension   ? Ischemic cardiomyopathy 11/2019  ? Neuropathic pain   ? Stroke [facility]) 03/2020  ? Tobacco abuse   ? ? ? ?PAST SURGICAL HISTORY: ?Past Surgical History:  ?Procedure Laterality Date  ? CORONARY/GRAFT ACUTE MI REVASCULARIZATION N/A 11/23/2019  ? Procedure: CORONARY/GRAFT ACUTE MI  REVASCULARIZATION;  Surgeon: [name], MD;  Location[facility] INVASIVE CV LAB;  Service: Cardiovascular; unsuccessful PTCA of distal LAD 100%.  O able to restore only minimal flow with 70% stenosis  ? LEFT HEART CATH AND CORONARY ANGIOGRAPHY N/A 11/23/2019  ? Procedure: LEFT HEART CATH AND CORONARY ANGIOGRAPHY;  Surgeon: [name], MD;  Location: [facility] INVASIVE CV LAB;  Service: Cardiovascular; ANTERIOR STEMI: Widely patent mid to distal LAD  stent, distal LAD is 100% stenosed associated with the third diagonal.  (Post PTCA - 70% residual but 100% D3).  RPDA tapers to a small vessel -80% stenosis.  Severely elevated LVEDP  ? NM MYOVIEW LTD  08/27/2020  ? (recurrent chest pain): Poor study due to gut attenuation.  EF ~50-55%.  No EKG changes.  MLSTLY FIXED LARGE/SEVRE perfusion defect in the basal-apical inferior, mid inferolateral & mid-apical anterior / apical walls: Findings are c/w known anatomy (apical LAD occlusion & PDA 80%, with gut attenuation enhancing inferior involvement -> but Echo with normal EF & no RWA - contradicts this finding.  ? TRANSTHORACIC ECHOCARDIOGRAM  11/24/2019  ? -post anterior STEMI: EF mildly reduced 45 to 50%.  GR 1 DD.  Elevated LAP.  No obvious heart WMA.  Moderate-severe AI with mild-moderate Aortic Sclerosis but no Stenosis.  ? TRANSTHORACIC ECHOCARDIOGRAM  03/23/2020  ? (stroke evaluation): EF improved to 55 to 60%.  Significant LVH-notable apical.  Consider cardiac MRI.  Moderate concentric LVH.  GR 1 DD.  Elevated LAP.  Mild to moderate Aortic Sclerosis but no Stenosis.  Moderate AI.   ? ? ? ?FAMILY HISTORY: ?Family History  ?Problem Relation Age of Onset  ? Hypertension Mother   ? Diabetes Mother   ? Hypertension Father   ? Diabetes Father   ? Diabetes Maternal Grandmother   ? Diabetes Maternal Grandfather   ? ? ? ?SOCIAL HISTORY: ?Social History  ? ?Socioeconomic History  ? Marital status: Married  ?  Spouse name: Not on file  ? Number of children: 0  ? Years of  education: 6 th  ? Highest education level: Not on file  ?Occupational History  ? Not on file  ?Tobacco Use  ? Smoking status: Every Day  ?  Packs/day: 1.50  ?  Years: 20.00  ?  Pack years: 30.00  ?  Types: [employer]

## 2021-12-01 ENCOUNTER — Encounter: Payer: Self-pay | Admitting: Family Medicine

## 2021-12-01 ENCOUNTER — Telehealth: Payer: Self-pay | Admitting: Internal Medicine

## 2021-12-01 ENCOUNTER — Ambulatory Visit (INDEPENDENT_AMBULATORY_CARE_PROVIDER_SITE_OTHER): Payer: Medicaid Other | Admitting: Family Medicine

## 2021-12-01 VITALS — BP 144/84 | HR 88 | Ht 62.0 in | Wt 164.0 lb

## 2021-12-01 DIAGNOSIS — G890 Central pain syndrome: Secondary | ICD-10-CM | POA: Diagnosis not present

## 2021-12-01 DIAGNOSIS — I6381 Other cerebral infarction due to occlusion or stenosis of small artery: Secondary | ICD-10-CM

## 2021-12-01 MED ORDER — CARBAMAZEPINE ER 100 MG PO CP12: 100.0000 mg | ORAL_CAPSULE | Freq: Two times a day (BID) | ORAL | 6 refills | Status: DC

## 2021-12-01 NOTE — Telephone Encounter (Signed)
Patient's husband called and stated patient needed to cancel her procedure for tomorrow because she was admitted to the hospital on Friday and is still there currently.  She will call to reschedule her appointment once she is discharge. ?

## 2021-12-02 ENCOUNTER — Other Ambulatory Visit: Payer: Self-pay | Admitting: Family Medicine

## 2021-12-02 ENCOUNTER — Encounter: Payer: Medicaid Other | Admitting: Internal Medicine

## 2021-12-02 ENCOUNTER — Telehealth: Payer: Self-pay | Admitting: *Deleted

## 2021-12-02 DIAGNOSIS — Z79899 Other long term (current) drug therapy: Secondary | ICD-10-CM

## 2021-12-02 LAB — CBC WITH DIFFERENTIAL/PLATELET
Basophils Absolute: 0 10*3/uL (ref 0.0–0.2)
Basos: 0 %
EOS (ABSOLUTE): 0 10*3/uL (ref 0.0–0.4)
Eos: 0 %
Hematocrit: 44.3 % (ref 34.0–46.6)
Hemoglobin: 14.7 g/dL (ref 11.1–15.9)
Immature Grans (Abs): 0.1 10*3/uL (ref 0.0–0.1)
Immature Granulocytes: 1 %
Lymphocytes Absolute: 1.7 10*3/uL (ref 0.7–3.1)
Lymphs: 24 %
MCH: 31.2 pg (ref 26.6–33.0)
MCHC: 33.2 g/dL (ref 31.5–35.7)
MCV: 94 fL (ref 79–97)
Monocytes Absolute: 0.4 10*3/uL (ref 0.1–0.9)
Monocytes: 6 %
Neutrophils Absolute: 4.9 10*3/uL (ref 1.4–7.0)
Neutrophils: 69 %
Platelets: 226 10*3/uL (ref 150–450)
RBC: 4.71 x10E6/uL (ref 3.77–5.28)
RDW: 12.8 % (ref 11.7–15.4)
WBC: 7.1 10*3/uL (ref 3.4–10.8)

## 2021-12-02 LAB — COMPREHENSIVE METABOLIC PANEL
ALT: 8 IU/L (ref 0–32)
AST: 10 IU/L (ref 0–40)
Albumin/Globulin Ratio: 1.5 (ref 1.2–2.2)
Albumin: 4.5 g/dL (ref 3.8–4.8)
Alkaline Phosphatase: 89 IU/L (ref 44–121)
BUN/Creatinine Ratio: 23 (ref 9–23)
BUN: 19 mg/dL (ref 6–24)
Bilirubin Total: <0.2 mg/dL (ref 0.0–1.2)
CO2: 15 mmol/L — ABNORMAL LOW (ref 20–29)
Calcium: 9.6 mg/dL (ref 8.7–10.2)
Chloride: 103 mmol/L (ref 96–106)
Creatinine, Ser: 0.81 mg/dL (ref 0.57–1.00)
Globulin, Total: 3.1 g/dL (ref 1.5–4.5)
Glucose: 183 mg/dL — ABNORMAL HIGH (ref 70–99)
Potassium: 4.1 mmol/L (ref 3.5–5.2)
Sodium: 139 mmol/L (ref 134–144)
Total Protein: 7.6 g/dL (ref 6.0–8.5)
eGFR: 89 mL/min/{1.73_m2} (ref >59)

## 2021-12-02 NOTE — Telephone Encounter (Signed)
Called and spoke with pt huband, Maria Harper (on [place]). Relayed results per AL,NP note. He verbalized understanding. They will call back to reschedule lab appt.  ?

## 2021-12-02 NOTE — Telephone Encounter (Signed)
-----   Message from Shawnie Dapper, NP sent at 12/02/2021 10:15 AM EDT ----- ?Labs are stable from neurologic standpoint. Sodium 139. She may proceed as directed with carbamazepine taking 100mg  daily for 1 week then increase to 100mg  BID for 1 week, then return for repeat labs. Glucose was elevated. Continue to monitor with PCP.  ?

## 2021-12-07 ENCOUNTER — Other Ambulatory Visit: Payer: Self-pay | Admitting: Family Medicine

## 2021-12-07 ENCOUNTER — Ambulatory Visit
Admit: 2021-12-07 | Discharge: 2021-12-07 | Disposition: A | Discharge status: Home / Self Care | Payer: Medicaid Other | Attending: Family Medicine | Admitting: Family Medicine

## 2021-12-07 DIAGNOSIS — R928 Other abnormal and inconclusive findings on diagnostic imaging of breast: Secondary | ICD-10-CM

## 2021-12-07 DIAGNOSIS — N6320 Unspecified lump in the left breast, unspecified quadrant: Secondary | ICD-10-CM

## 2021-12-07 IMAGING — US US BREAST*L* LIMITED INC AXILLA
1 series · 13 of 14 positions shown · non-contrast
Comparison: Previous exam.

CLINICAL DATA: Patient returns after baseline screening study for
possible masses in the LEFT breast.

EXAM:
DIGITAL DIAGNOSTIC UNILATERAL LEFT MAMMOGRAM WITH TOMOSYNTHESIS AND
CAD; ULTRASOUND LEFT BREAST LIMITED
TECHNIQUE: Left digital diagnostic mammography and breast tomosynthesis was
performed. The images were evaluated with computer-aided detection.;
Targeted ultrasound examination of the left breast was performed.

[Series 1: us breast*left* limited inc axilla · 0.06mm/px · 13 of 14 slices shown]
[im 1/14]
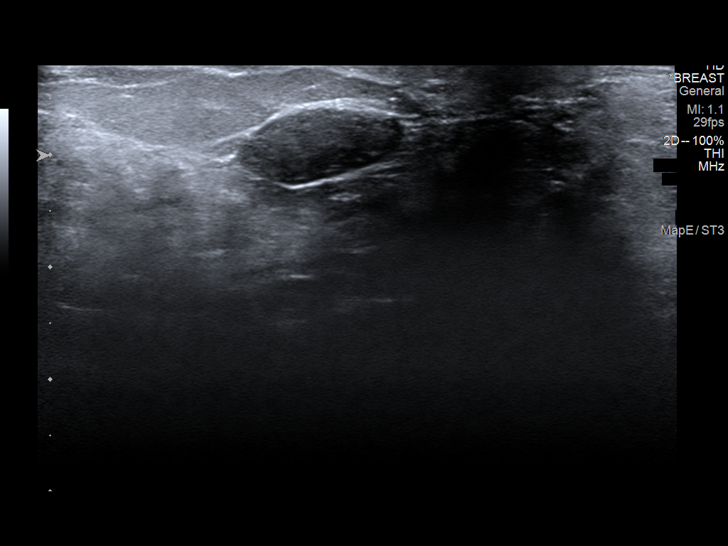
[im 2/14]
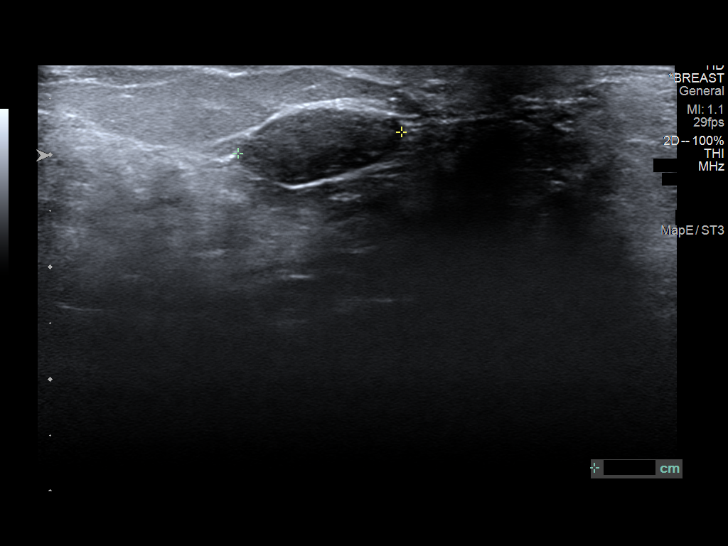
[im 3/14]
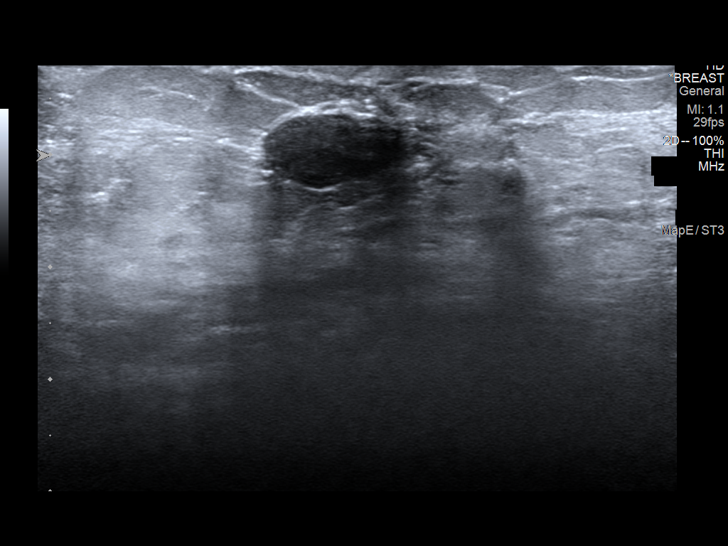
[im 4/14]
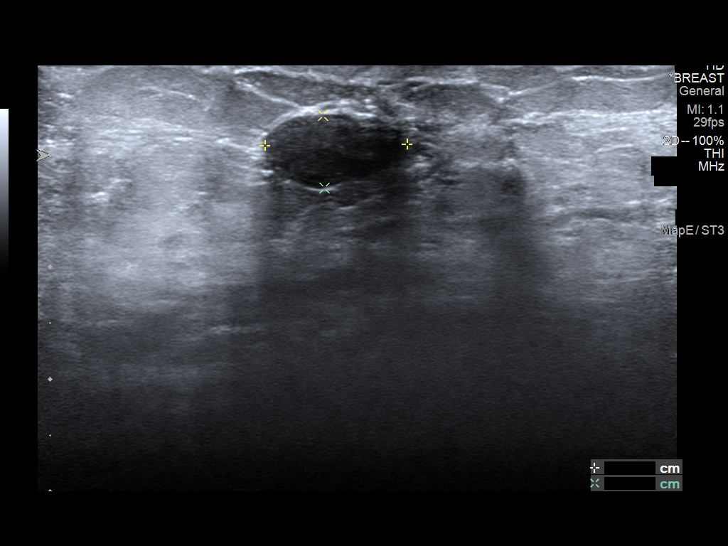
[im 5/14]
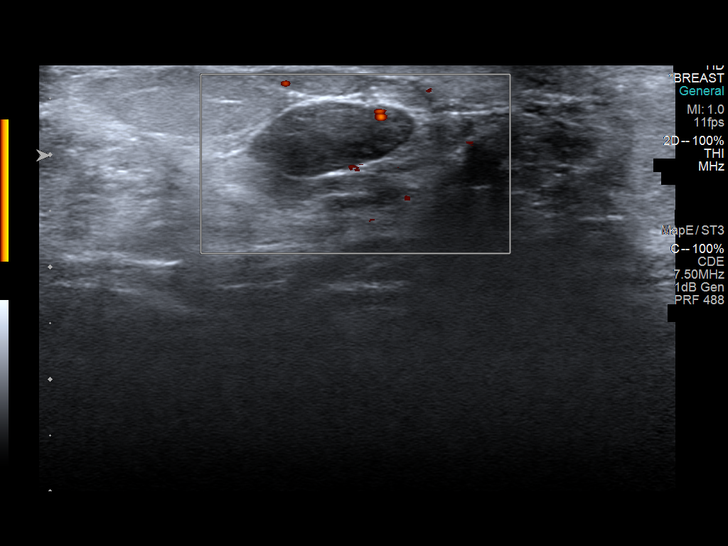
[im 6/14]
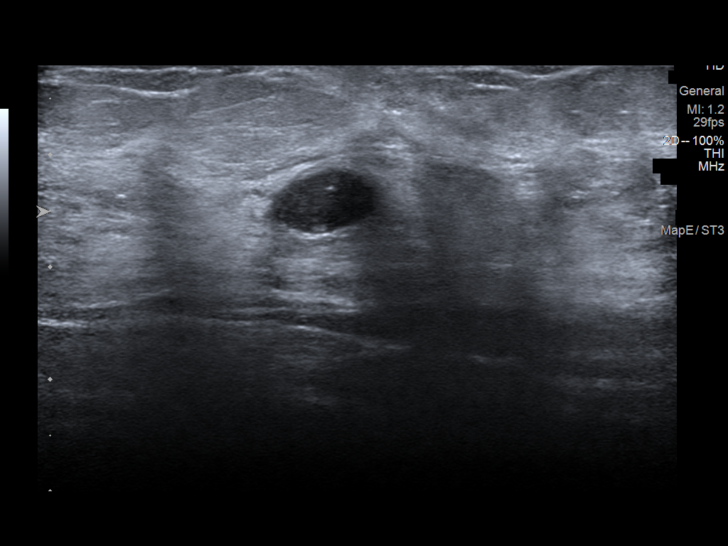
[im 8/14]
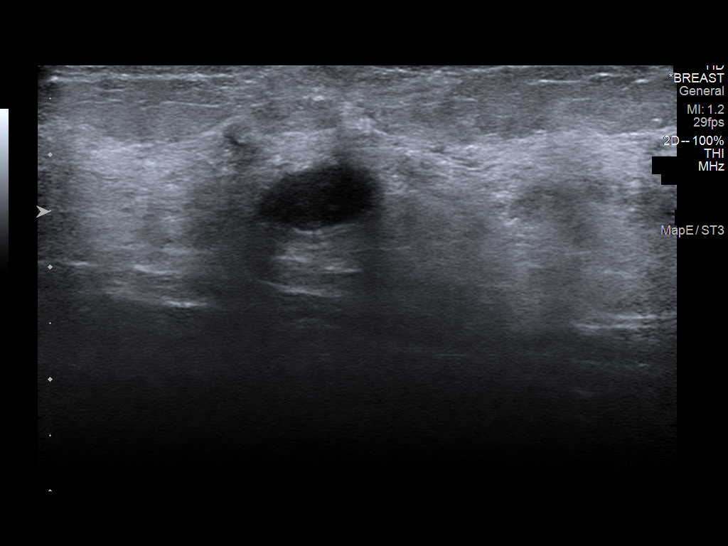
[im 9/14]
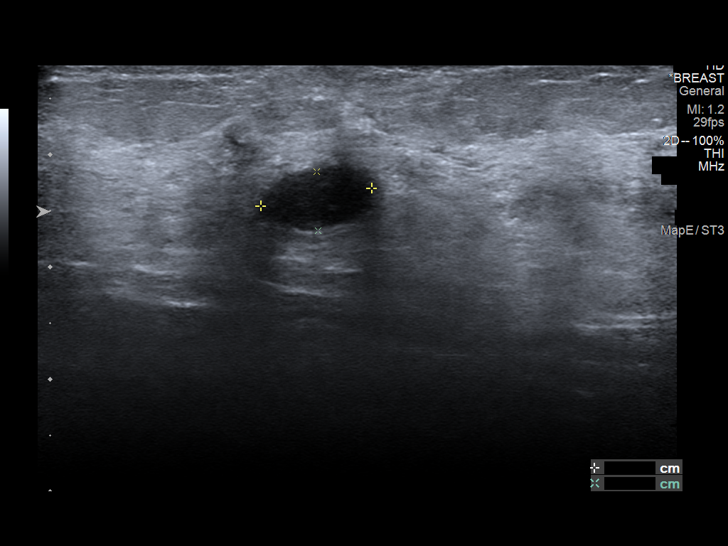
[im 10/14]
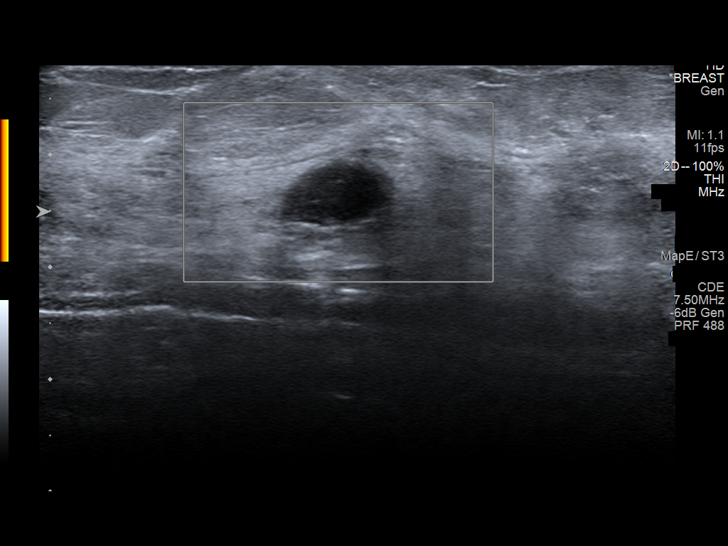
[im 11/14]
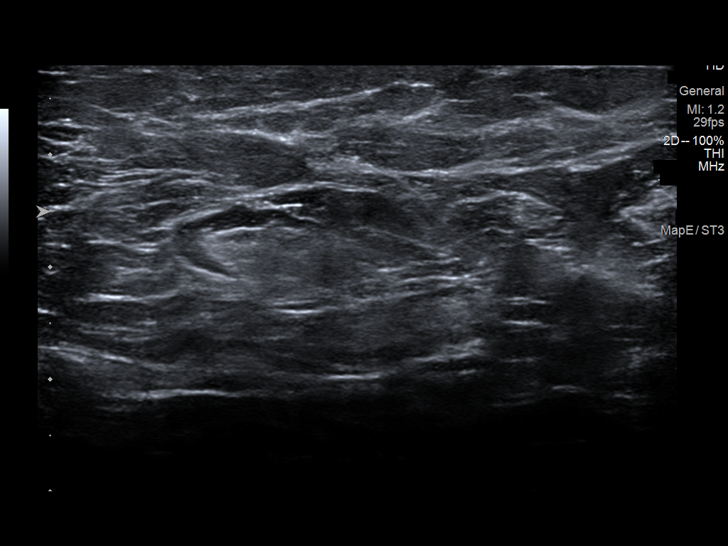
[im 12/14]
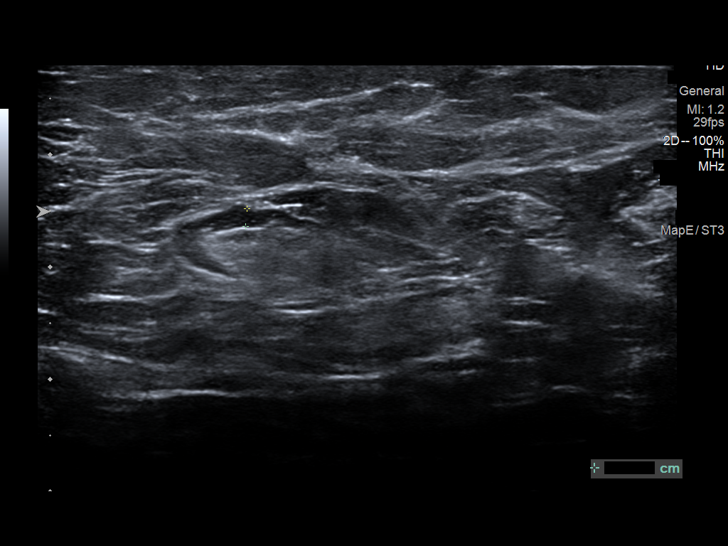
[im 13/14]
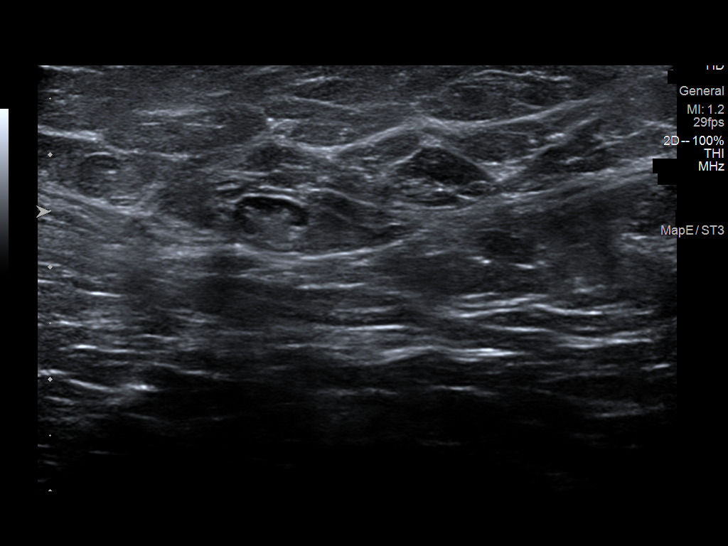
[im 14/14]
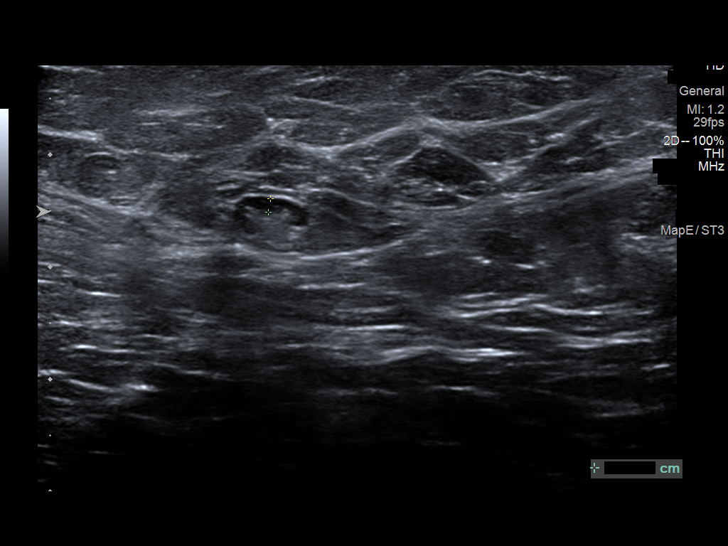

[13 of 14 positions shown; findings below may reference images not displayed]

ACR Breast Density Category c: The breast tissue is heterogeneously
dense, which may obscure small masses.
FINDINGS: Additional 2-D and 3-D images are performed. These views confirm
presence of an oval circumscribed mass in the MEDIAL aspect of the
LEFT breast. An oval circumscribed mass associated with coarse
calcifications is identified in the UPPER INNER QUADRANT of the LEFT
breast.

Targeted ultrasound is performed, showing a circumscribed oval
hypoechoic parallel mass in the 9 o'clock location of the LEFT
breast 1 centimeter from the nipple. There is associated internal
blood flow. Mass measures 1.5 x 3 x 0 7 centimeters.

In the 10 o'clock location 3 centimeters from the nipple, oval
circumscribed hypoechoic parallel mass contains internal
calcifications and measures 0.9 x 1.0 x 0.5 centimeters. Mass
correlates well with the partially calcified mass seen
mammographically.

Evaluation of the LEFT axilla is negative for adenopathy.
IMPRESSION: Benign-appearing masses in the 9 o'clock and 10 locations of the
LEFT breast. We discussed management options including excision,
biopsy, and close follow-up. Imaging followup is recommended at 6,
12, and 24 months to assess stability. The patient concurs with this
plan.

RECOMMENDATION:
Recommend LEFT breast ultrasound in 6 months.

I have discussed the findings and recommendations with the patient
and her husband. If applicable, a reminder letter will be sent to
the patient regarding the next appointment.

BI-RADS CATEGORY  3: Probably benign.

## 2021-12-08 ENCOUNTER — Ambulatory Visit: Payer: Medicaid Other | Admitting: Nutrition

## 2021-12-14 ENCOUNTER — Other Ambulatory Visit: Payer: Self-pay | Admitting: Cardiology

## 2021-12-15 NOTE — Telephone Encounter (Signed)
Called and spoke w/ pt. Scheduled repeat lab appt 12/21/21 at 9:00am.  ?

## 2021-12-16 ENCOUNTER — Encounter: Payer: Medicaid Other | Attending: Family Medicine | Admitting: Nutrition

## 2021-12-16 DIAGNOSIS — E1165 Type 2 diabetes mellitus with hyperglycemia: Secondary | ICD-10-CM | POA: Insufficient documentation

## 2021-12-16 DIAGNOSIS — Z713 Dietary counseling and surveillance: Secondary | ICD-10-CM | POA: Diagnosis not present

## 2021-12-16 DIAGNOSIS — Z794 Long term (current) use of insulin: Secondary | ICD-10-CM

## 2021-12-21 ENCOUNTER — Other Ambulatory Visit: Payer: Medicaid Other

## 2021-12-29 NOTE — Telephone Encounter (Signed)
Called the pt and husband answered. They were in a MD apt at the time of the call. Advised I was calling to remind that we need to have her sodium level rechecked. I advised that they could have it done through their MD if they feel comfortable ordering and have the result sent to [place]. Otherwise they should pop in this week and have it drawn. He verbalized understanding and said they would get it done.

## 2022-01-07 ENCOUNTER — Telehealth: Payer: Self-pay | Admitting: Family Medicine

## 2022-01-07 NOTE — Telephone Encounter (Signed)
Labs reviewed in Epic from 12/29/2021. Sodium 139.

## 2022-01-12 NOTE — Progress Notes (Signed)
Patient is here today because she says she Is trying to get her blood sugars under better control.   Insulin dose:  Novolog: 10u ac  meals.  Forgets rarely,   Lantus 35u .  Says forgets this once a week on average. SBGM:  rarely, does not like to do this.  Does not know what to do with the readings.  Tests HS: 250-300 Exercise:  none Diet:  Typical day 7AM: up.  Takes Novolog, drinks coffee with 10 packs of sugar and milk,  Usually had cold cereal and milk. 12-3 PM: [place] sandwich with chips and small can of regular coke, or Mt. Dew.   8 PM: Supper: protein-5 chicken wings, fried, or veg: likes greens,: spinach, cabage, corn, potatoes,  water to drink           [place]-every night:  last night: 5 Oreo cookies Snacks all day with fruit-apples, oranges bananas,  Discussion: 3 basic food groups, what foods are in each group, and how they affect blood sugar readings.Need for all 3 groups of foods in each meal, but need to limit amounts of each. How her insulins work to control her blood sugar rises after meals Need to adjust Novolog dose based on current blood sugar readings and how much carbohydrate in in the meal.  2u more if drinking sweet drinks, and eating cold cereal and milk. Stressed that if more insulin for sweets, the better the blood sugars, but the more weight gain.   Need to monitor blood sugar readings.  She was given a Libre 3 sensor and app downloaded to her phone and she was shown how to insert this.  The sensor was applied to her left outer arm and started.  She was shown how to scan this with her phone. Need to scan/monitor blood sugar readings before the meal, and 2 hours after the meal.  Goals: AC: less than 120, 2 hr. Pc: less than 170.  Need to add protein to breakfast meal, to prevent hunger mid morning, and stop snacking between meals. Need for exercise if want to loose weight.  Need for ok from MD before beginning this.  To walk briskly-working up to 30-40 minutes 4-5  days/wk. 8  Meal plan given with 2 servings of carbs at bfast and lunch, and 2 at supper and HS snack   Reviewed meal plan.

## 2022-01-12 NOTE — Patient Instructions (Signed)
1.Stop all sweet drinks, cold cereal and milk 2.Have fruit with meals 3.Test blood sugar before each meal, and at bedtime.  If drinking sweet drinks, you will need 2u more Novolog-remember this will put weight on you very quickly. 4. Make sure all meals have protein, 2-3 servings of carbohydrates and one serving of fat.  Review back of yellow carb for serving sizes. 5.  Return in 4 weeks, call if questions before then.

## 2022-02-03 ENCOUNTER — Other Ambulatory Visit: Payer: Self-pay | Admitting: Family Medicine

## 2022-02-03 DIAGNOSIS — N6320 Unspecified lump in the left breast, unspecified quadrant: Secondary | ICD-10-CM

## 2022-02-10 ENCOUNTER — Inpatient Hospital Stay: Admission: RE | Admit: 2022-02-10 | Payer: Medicaid Other | Source: Ambulatory Visit

## 2022-02-10 ENCOUNTER — Other Ambulatory Visit: Payer: Medicaid Other

## 2022-02-12 ENCOUNTER — Ambulatory Visit
Admission: RE | Admit: 2022-02-12 | Discharge: 2022-02-12 | Disposition: A | Discharge status: Home / Self Care | Payer: Medicaid Other | Source: Ambulatory Visit | Attending: Family Medicine | Admitting: Family Medicine

## 2022-02-12 DIAGNOSIS — N6320 Unspecified lump in the left breast, unspecified quadrant: Secondary | ICD-10-CM

## 2022-02-16 ENCOUNTER — Other Ambulatory Visit: Payer: Self-pay | Admitting: Cardiology

## 2022-03-22 ENCOUNTER — Emergency Department (HOSPITAL_COMMUNITY): Payer: Medicaid Other

## 2022-03-22 ENCOUNTER — Other Ambulatory Visit: Payer: Self-pay

## 2022-03-22 ENCOUNTER — Emergency Department (HOSPITAL_COMMUNITY)
Admission: EM | Admit: 2022-03-22 | Discharge: 2022-03-23 | Disposition: A | Discharge status: Home / Self Care | Payer: Medicaid Other | Attending: Emergency Medicine | Admitting: Emergency Medicine

## 2022-03-22 ENCOUNTER — Encounter (HOSPITAL_COMMUNITY): Payer: Self-pay

## 2022-03-22 DIAGNOSIS — D649 Anemia, unspecified: Secondary | ICD-10-CM | POA: Diagnosis not present

## 2022-03-22 DIAGNOSIS — E1165 Type 2 diabetes mellitus with hyperglycemia: Secondary | ICD-10-CM | POA: Diagnosis not present

## 2022-03-22 DIAGNOSIS — R0789 Other chest pain: Secondary | ICD-10-CM | POA: Insufficient documentation

## 2022-03-22 DIAGNOSIS — R079 Chest pain, unspecified: Secondary | ICD-10-CM

## 2022-03-22 DIAGNOSIS — I10 Essential (primary) hypertension: Secondary | ICD-10-CM | POA: Diagnosis not present

## 2022-03-22 DIAGNOSIS — Z7902 Long term (current) use of antithrombotics/antiplatelets: Secondary | ICD-10-CM | POA: Insufficient documentation

## 2022-03-22 DIAGNOSIS — Z79899 Other long term (current) drug therapy: Secondary | ICD-10-CM | POA: Insufficient documentation

## 2022-03-22 DIAGNOSIS — I2510 Atherosclerotic heart disease of native coronary artery without angina pectoris: Secondary | ICD-10-CM | POA: Insufficient documentation

## 2022-03-22 DIAGNOSIS — Z794 Long term (current) use of insulin: Secondary | ICD-10-CM | POA: Insufficient documentation

## 2022-03-22 DIAGNOSIS — Z7984 Long term (current) use of oral hypoglycemic drugs: Secondary | ICD-10-CM | POA: Insufficient documentation

## 2022-03-22 LAB — BASIC METABOLIC PANEL
Anion gap: 12 (ref 5–15)
BUN: 15 mg/dL (ref 6–20)
CO2: 16 mmol/L — ABNORMAL LOW (ref 22–32)
Calcium: 8.7 mg/dL — ABNORMAL LOW (ref 8.9–10.3)
Chloride: 106 mmol/L (ref 98–111)
Creatinine, Ser: 1.02 mg/dL — ABNORMAL HIGH (ref 0.44–1.00)
GFR, Estimated: >60 mL/min (ref >60)
Glucose, Bld: 241 mg/dL — ABNORMAL HIGH (ref 70–99)
Potassium: 3.5 mmol/L (ref 3.5–5.1)
Sodium: 134 mmol/L — ABNORMAL LOW (ref 135–145)

## 2022-03-22 LAB — CBC
HCT: 34.4 % — ABNORMAL LOW (ref 36.0–46.0)
Hemoglobin: 10.7 g/dL — ABNORMAL LOW (ref 12.0–15.0)
MCH: 28.8 pg (ref 26.0–34.0)
MCHC: 31.1 g/dL (ref 30.0–36.0)
MCV: 92.5 fL (ref 80.0–100.0)
Platelets: 170 10*3/uL (ref 150–400)
RBC: 3.72 MIL/uL — ABNORMAL LOW (ref 3.87–5.11)
RDW: 15.2 % (ref 11.5–15.5)
WBC: 7.4 10*3/uL (ref 4.0–10.5)
nRBC: 0 % (ref 0.0–0.2)

## 2022-03-22 LAB — I-STAT BETA HCG BLOOD, ED (MC, WL, AP ONLY): I-stat hCG, quantitative: <5 m[IU]/mL (ref <5)

## 2022-03-22 LAB — TROPONIN I (HIGH SENSITIVITY): Troponin I (High Sensitivity): 6 ng/L (ref <18)

## 2022-03-22 NOTE — ED Provider Triage Note (Signed)
Emergency Medicine Provider Triage Evaluation Note  Maria Harper , a 51 y.o. female  was evaluated in triage.  Pt complains of chest pain which she describes as pressure associated with shortness of breath but no nausea or radiation that started an hour before arrival.  Patient with previous history of ACS, and heart failure, she has previous history of cocaine abuse, previous history of CVA.  She has history of high cholesterol and diabetes but denies history of hypertension.  She reports no improvement with nitroglycerin.  Patient has not improved or worsened since onset.  She reports pain is 10/10.Marland Kitchen  Review of Systems  Positive: Chest pain, shortness of breath Negative: Nausea, vomiting, radiation  Physical Exam  BP 100/71 (BP Location: Left Arm)   Pulse 91   Temp 98.1 F (36.7 C) (Oral)   Resp 18   Ht 5\' 2"  (1.575 m)   Wt 76.2 kg   SpO2 100%   BMI 30.73 kg/m  Gen:   Awake, no distress   Resp:  Normal effort  MSK:   Moves extremities without difficulty  Other:    Medical Decision Making  Medically screening exam initiated at 5:48 PM.  Appropriate orders placed.  Maria Harper was informed that the remainder of the evaluation will be completed by another provider, this initial triage assessment does not replace that evaluation, and the importance of remaining in the ED until their evaluation is complete.  Work-up initiated   Sharol Harness, Olene Floss 03/22/22 1749

## 2022-03-22 NOTE — ED Triage Notes (Signed)
Woke up feeling unwell and then 45 mins ago started having chest pain.  Described as pressure 10/10 denies n/v +SOB  BP 108/70 HR 86 RR 16 98% CBG 131  Patient took 1 nitro at home with no relief.

## 2022-03-23 LAB — TROPONIN I (HIGH SENSITIVITY): Troponin I (High Sensitivity): 7 ng/L (ref <18)

## 2022-03-23 MED ORDER — LIDOCAINE 5 % EX PTCH: 1.0000 | MEDICATED_PATCH | Freq: Every day | CUTANEOUS | 0 refills | Status: DC | PRN

## 2022-03-23 MED ORDER — LIDOCAINE 5 % EX PTCH
1.0000 | MEDICATED_PATCH | CUTANEOUS | Status: DC
  Administered 2022-03-23: 1 via TRANSDERMAL
  Filled 2022-03-23: qty 1

## 2022-03-23 MED ORDER — METHOCARBAMOL 500 MG PO TABS
500.0000 mg | ORAL_TABLET | Freq: Once | ORAL | Status: DC
  Filled 2022-03-23: qty 1

## 2022-03-23 NOTE — ED Provider Notes (Signed)
[facility] EMERGENCY DEPARTMENT Provider Note   CSN: [id number] Arrival date & time: 03/22/22  1725     History  Chief Complaint  Patient presents with   Chest Pain    Maria Harper is a 51 y.o. female with a hx of CAD, DM, hypertension, hypercholesterolemia, prior stroke, and prior cocaine use who presents to the ED with complaints of chest pain that started yesterday. Patient reports pain to the left chest, feels like pressure, occurring intermittently, but has gradually improved since onset. Initially had some dyspnea- resolved. Reports pain is worse when she sits up, better when she lays down. Of note did start going back to the gym last week, worked out this morning without pain during exercise, did some upper body work, but no specific chest exercises, pain started several hours later while cooking. Denies fever, chills, nausea, vomiting, diaphoresis, leg pain/swelling, hemoptysis, recent surgery/trauma, recent long travel, hormone use, personal hx of cancer, or hx of DVT/PE.    HPI     Home Medications Prior to Admission medications   Medication Sig Start Date End Date Taking? Authorizing Provider  atorvastatin (LIPITOR) 80 MG tablet TAKE 1 Tablet BY MOUTH ONCE DAILY 11/04/21   [name], MD  buPROPion ER [facility] SR) 100 MG 12 hr tablet Take 100 mg by mouth 2 (two) times daily. 11/10/21   [provider]  carbamazepine (CARBATROL) 100 MG 12 hr capsule Take 1 capsule (100 mg total) by mouth 2 (two) times daily. 12/01/21   Lomax, Amy, NP  carvedilol (COREG) 25 MG tablet TAKE 1 Tablet BY MOUTH TWICE DAILY with a meal 11/04/21   [name], MD  CLARITIN 10 MG tablet Take 10 mg by mouth daily. 01/13/21   [provider]  clopidogrel (PLAVIX) 75 MG tablet TAKE 1 Tablet BY MOUTH ONCE DAILY 02/17/22   [name], MD  cyclobenzaprine (FLEXERIL) 10 MG tablet Take 10 mg by mouth 3 (three) times daily as needed for muscle spasms.      [provider]  doxepin (SINEQUAN) 50 MG capsule Take 50 mg by mouth in the morning, at noon, and at bedtime.    [provider]  empagliflozin (JARDIANCE) 10 MG TABS tablet Take 1 tablet (10 mg total) by mouth daily before breakfast. KEEP OV. 09/29/21   [name], MD  Evolocumab (REPATHA SURECLICK) 140 MG/ML SOAJ Inject 140 mg into the skin every 14 (fourteen) days. KEEP OV. 09/29/21   [name], MD  gabapentin (NEURONTIN) 300 MG capsule Take 3 capsules (900 mg total) by mouth 3 (three) times daily. 08/31/21   Penumalli, Glenford Bayley, MD  glipiZIDE (GLUCOTROL) 5 MG tablet Take 5 mg by mouth 2 (two) times daily. 11/18/21   [provider]  ibuprofen (ADVIL) 800 MG tablet Take 800 mg by mouth every 6 (six) hours as needed. 10/15/21   [provider]  Insulin Glargine Solostar (LANTUS) 100 UNIT/ML Solostar Pen Inject 35 Units into the skin daily. 09/25/21   [provider]  insulin lispro (HUMALOG) 100 UNIT/ML KwikPen Inject 10 Units into the skin in the morning, at noon, and at bedtime. 09/25/21   [provider]  ipratropium (ATROVENT HFA) 17 MCG/ACT inhaler Inhale 2 puffs into the lungs every 6 (six) hours.    [provider]  isosorbide mononitrate (IMDUR) 60 MG 24 hr tablet TABLET 1 AND 1/2 tablets BY MOUTH EVERY MORNING AND 1/2 tablet AT BEDTIME 11/04/21   Bryan Lemma  W, MD  LINZESS 145 MCG CAPS capsule Take 145 mcg by mouth daily. 11/23/21   [provider]  naproxen (NAPROSYN) 375 MG tablet Take 1 tablet (375 mg total) by mouth 2 (two) times daily. 08/17/21   Kommor, Madison, MD  nitroGLYCERIN (NITROSTAT) 0.4 MG SL tablet DISSOLVE 1 TABLET UNDER THE TONGUE EVERY 5 MINUTES AS NEEDED FOR CHEST PAIN. DO NOT EXCEED A TOTAL OF 3 DOSES IN 15 MINUTES. CALL 911 IF NO RELIEF. 10/08/21   [name], MD  pantoprazole (PROTONIX) 40 MG tablet Take 1 tablet (40 mg total) by mouth 2 (two) times daily. KEEP OV. 09/29/21   [name], MD  polyethylene glycol powder [facility]) powder Take 17 g by mouth daily as needed for moderate constipation.  04/05/18   [provider]  QUEtiapine (SEROQUEL) 400 MG tablet Take 400 mg by mouth at bedtime. 04/05/18   [provider]  SEROQUEL 200 MG tablet Take 200 mg by mouth daily. In the morning 07/22/21   [provider]      Allergies    Bee venom, Contrast media [iodinated contrast media], and Tomato    Review of Systems   Review of Systems  Constitutional:  Negative for chills, diaphoresis and fever.  Respiratory:  Positive for shortness of breath.   Cardiovascular:  Positive for chest pain. Negative for leg swelling.  Gastrointestinal:  Negative for abdominal pain, nausea and vomiting.  Neurological:  Negative for syncope.  All other systems reviewed and are negative.   Physical Exam Updated Vital Signs BP 139/83   Pulse 79   Temp 97.6 F (36.4 C) (Oral)   Resp 18   Ht 5\' 2"  (1.575 m)   Wt 76.2 kg   LMP 03/17/2022   SpO2 100%   BMI 30.73 kg/m  Physical Exam Vitals and nursing note reviewed.  Constitutional:      General: She is not in acute distress.    Appearance: She is well-developed. She is not toxic-appearing.  HENT:     Head: Normocephalic and atraumatic.  Eyes:     General:        Right eye: No discharge.        Left eye: No discharge.     Conjunctiva/sclera: Conjunctivae normal.  Cardiovascular:     Rate and Rhythm: Normal rate and regular rhythm.     Pulses:          Radial pulses are 2+ on the right side and 2+ on the left side.  Pulmonary:     Effort: No respiratory distress.     Breath sounds: Normal breath sounds. No wheezing or rales.  Chest:     Chest wall: Tenderness (left anterior chest wall) present.  Abdominal:     General: There is no distension.     Palpations: Abdomen is soft.     Tenderness: There is no abdominal tenderness.  Musculoskeletal:     Cervical back: Neck supple.     Right  lower leg: No tenderness. No edema.     Left lower leg: No tenderness. No edema.  Skin:    General: Skin is warm and dry.  Neurological:     Mental Status: She is alert.     Comments: Clear speech.   Psychiatric:        Behavior: Behavior normal.     ED Results / Procedures / Treatments   Labs (all labs ordered are listed, but only abnormal results are displayed) Labs Reviewed  BASIC METABOLIC PANEL - Abnormal; Notable for the following components:      Result Value   Sodium 134 (*)    CO2 16 (*)    Glucose, Bld 241 (*)    Creatinine, Ser 1.02 (*)    Calcium 8.7 (*)    All other components within normal limits  CBC - Abnormal; Notable for the following components:   RBC 3.72 (*)    Hemoglobin 10.7 (*)    HCT 34.4 (*)    All other components within normal limits  I-STAT BETA HCG BLOOD, ED ([facility], [facility], [facility] ONLY)  TROPONIN I (HIGH SENSITIVITY)  TROPONIN I (HIGH SENSITIVITY)    EKG EKG Interpretation  Date/Time:  Monday March 22 2022 17:43:58 EDT Ventricular Rate:  87 PR Interval:  130 QRS Duration: 82 QT Interval:  390 QTC Calculation: 469 R Axis:   -27 Text Interpretation: Normal sinus rhythm Possible Left atrial enlargement Left ventricular hypertrophy ( R in aVL , Cornell product ) Nonspecific T wave abnormality Prolonged QT Abnormal ECG When compared with ECG of 24-Nov-2021 11:15, PREVIOUS ECG IS PRESENT Confirmed by Kennis Carina [phone]) on 03/23/2022 6:16:09 AM  Radiology DG Chest 2 View  Result Date: 03/22/2022 CLINICAL DATA:  Chest pain EXAM: CHEST - 2 VIEW COMPARISON:  11/24/2021 FINDINGS: Mild scarring at the bases. Low lung volumes. No acute consolidation, pleural effusion or pneumothorax. Normal cardiac size IMPRESSION: No active cardiopulmonary disease. Linear scarring or atelectasis at the bases. Electronically Signed   By: Jasmine Pang M.D.   On: 03/22/2022 19:28    Procedures Procedures    Medications Ordered in ED Medications  methocarbamol (ROBAXIN)  tablet 500 mg (500 mg Oral Not Given 03/23/22 0353)  lidocaine (LIDODERM) 5 % 1 patch (1 patch Transdermal Patch Applied 03/23/22 0356)    ED Course/ Medical Decision Making/ A&P                           Medical Decision Making Risk Prescription drug management.   Patient presents to the emergency department with chest pain. Patient nontoxic appearing, in no apparent distress, vitals with hypertension. Mild left chest wall TTP otherwise unremarkable exam.   DDX including but not limited to: ACS, pulmonary embolism, dissection, pneumothorax, pneumonia, arrhythmia, severe anemia, MSK, GERD, anxiety, abdominal process .   Additional history obtained:  Chart & nursing note reviewed.   EKG: Normal sinus rhythm Possible Left atrial enlargement Left ventricular hypertrophy ( R in aVL , Cornell product ) Nonspecific T wave abnormality Prolonged QT Abnormal ECG When compared with ECG of 24-Nov-2021 11:15, PREVIOUS ECG IS PRESENT   Lab Tests:  I reviewed & interpreted labs including:  CBC: mild anemia BMP: bicarb 16, hyperglycemic, gap normal- however very similar labs w/ last metabolic panel.  Preg test negative Troponins: flat  Imaging Studies ordered:  I viewed the following imaging, agree with radiologist impression:  No active cardiopulmonary disease. Linear scarring or atelectasis at the bases.  ED Course:  Troponins are flat, EKG without STEMI, patient relays does not feel like prior chest pain requiring stents, she exercised without pain this AM, feel that ACS is less likely. Low risk wells, low suspicion for PE. Pain is not a tearing sensation, symmetric pulses, no widening of mediastinum on CXR, doubt dissection. Abdominal exam benign. Reassuring CXR. Bicarb will need PCP recheck. Unclear definitive etiology to sxs however patient gradually improving, some reproducibility after new exercise regimen- possibly MSK. Appears reasonable for discharge  at this time with close outpatient  follow up.   Based on patient's chief complaint, I considered admission might be necessary, however after reassuring ED workup feel patient is reasonable for discharge.   I discussed results, treatment plan, need for PCP follow-up, and return precautions with the patient. Provided opportunity for questions, patient confirmed understanding and is in agreement with plan.    Portions of this note were generated with Scientist, clinical (histocompatibility and immunogenetics). Dictation errors may occur despite best attempts at proofreading.  Final Clinical Impression(s) / ED Diagnoses Final diagnoses:  Chest pain, unspecified type    Rx / DC Orders ED Discharge Orders          Ordered    lidocaine (LIDODERM) 5 %  Daily PRN        03/23/22 0634              Cherly Anderson, PA-C 03/23/22 0636    Sabas Sous, MD 03/23/22 [phone]

## 2022-03-23 NOTE — Discharge Instructions (Signed)
You were seen in the emergency department today for chest pain. Your work-up in the emergency department has been overall reassuring. Your labs have been fairly normal and or similar to previous blood work you have had done-her bicarb is low and your blood sugar is high which has been present previously but we would like you to have these rechecked, you do have some new anemia which should be rechecked by your primary care provider as well.. Your EKG and the enzyme we use to check your heart did not show an acute heart attack at this time. Your chest x-ray did not show pneumonia.  Use the Lidoderm patch to the are of most significant pain once per day.  Be sure to remove and discard patch within 12 hours.  Do not apply heat over this.  Try to take it easy as far as upper body exercises go at the gym.    We have prescribed you new medication(s) today. Discuss the medications prescribed today with your pharmacist as they can have adverse effects and interactions with your other medicines including over the counter and prescribed medications. Seek medical evaluation if you start to experience new or abnormal symptoms after taking one of these medicines, seek care immediately if you start to experience difficulty breathing, feeling of your throat closing, facial swelling, or rash as these could be indications of a more serious allergic reaction   We would like you to follow up closely with your primary care provider as well as your cardiologist within 1-3 days. Return to the ER immediately should you experience any new or worsening symptoms including but not limited to return of pain, worsened pain, vomiting, shortness of breath, dizziness, lightheadedness, passing out, or any other concerns that you may have.

## 2022-03-23 NOTE — ED Notes (Signed)
RN reviewed discharge instructions with pt. Pt verbalized understanding and had no further questions. VSS upon discharge.  

## 2022-03-24 ENCOUNTER — Telehealth: Payer: Self-pay | Admitting: Cardiology

## 2022-03-24 NOTE — Telephone Encounter (Signed)
New Message:     Patient said she was seen in the ER on Monday(03-22-22) and they wanted hr to use the  Lidocaine patch. She said she can afford the patch.> She wants to know if Dr Herbie Baltimore can order something else in the place of the Lidocaine Patch please?

## 2022-03-25 NOTE — Telephone Encounter (Signed)
I really do not know of any other kind of medication that takes to place a lidocaine patch.  Basically it is something that can be bought online without a prescription.  IcyHot does the same thing.  Bryan Lemma

## 2022-03-25 NOTE — Telephone Encounter (Signed)
Left detailed message as noted for patient (DPR), call back w/any questions  

## 2022-03-26 NOTE — Telephone Encounter (Signed)
Patient calling back with questions. Please advise 

## 2022-03-26 NOTE — Telephone Encounter (Signed)
Left message to call back  

## 2022-04-07 ENCOUNTER — Telehealth: Payer: Self-pay | Admitting: Cardiology

## 2022-04-07 NOTE — Telephone Encounter (Signed)
Patient states she was returning call. Please advise  

## 2022-04-07 NOTE — Telephone Encounter (Signed)
Returned call to patient, patient reports ongoing intermittent chest pain since ED visit.  Reports pain can occur with rest or exertion. Describes pain as sharp.   No SOB with pain but reports some SOB at night.  Patient reports taking NTG with decrease in pain but denies relief.   Offered sooner appointment but patient must coordinate with insurance and give 3 days notice.  Patient has appointment 9/15 with Dr. Herbie Baltimore.    Patient will keep this appointment for further assessment.

## 2022-04-13 ENCOUNTER — Ambulatory Visit: Payer: Medicaid Other | Admitting: Podiatry

## 2022-04-16 ENCOUNTER — Ambulatory Visit: Payer: Medicaid Other | Attending: Cardiology | Admitting: Cardiology

## 2022-04-16 ENCOUNTER — Encounter: Payer: Self-pay | Admitting: Cardiology

## 2022-04-16 VITALS — BP 117/60 | HR 86 | Ht 62.0 in | Wt 158.0 lb

## 2022-04-16 DIAGNOSIS — E785 Hyperlipidemia, unspecified: Secondary | ICD-10-CM | POA: Diagnosis not present

## 2022-04-16 DIAGNOSIS — R079 Chest pain, unspecified: Secondary | ICD-10-CM

## 2022-04-16 DIAGNOSIS — I5032 Chronic diastolic (congestive) heart failure: Secondary | ICD-10-CM

## 2022-04-16 DIAGNOSIS — Z720 Tobacco use: Secondary | ICD-10-CM

## 2022-04-16 DIAGNOSIS — E1169 Type 2 diabetes mellitus with other specified complication: Secondary | ICD-10-CM | POA: Diagnosis not present

## 2022-04-16 DIAGNOSIS — I252 Old myocardial infarction: Secondary | ICD-10-CM

## 2022-04-16 DIAGNOSIS — I25118 Atherosclerotic heart disease of native coronary artery with other forms of angina pectoris: Secondary | ICD-10-CM | POA: Diagnosis not present

## 2022-04-16 MED ORDER — IBUPROFEN 800 MG PO TABS: ORAL_TABLET | ORAL | 1 refills | Status: DC

## 2022-04-16 MED ORDER — AMLODIPINE BESYLATE 5 MG PO TABS: 5.0000 mg | ORAL_TABLET | Freq: Every day | ORAL | 3 refills | Status: DC

## 2022-04-16 MED ORDER — CARVEDILOL 12.5 MG PO TABS: 12.5000 mg | ORAL_TABLET | Freq: Two times a day (BID) | ORAL | 3 refills | Status: DC

## 2022-04-16 NOTE — Assessment & Plan Note (Addendum)
I am not sure what happened to her lipids.  Need to confirm that she is taking her atorvastatin and Repatha.  Recheck lipids.  I do question [place] medication adherence because she also has a significant jump in her A1c. We will probably need to see her in closer follow-up

## 2022-04-16 NOTE — Progress Notes (Addendum)
Primary Care Provider: Vassie Moment, MD (Inactive) [place] Cardiologist: Glenetta Hew, MD Electrophysiologist: None  Clinic Note: Chief Complaint  Patient presents with   Hospitalization Follow-up    Recent ER for chest pain   Coronary Artery Disease    LAD stent with distal LAD diffuse disease.  Recurrent chest pain-prolonged.    ===================================  ASSESSMENT/PLAN   Chest pain, not suspicious for cardiac etiology/unstable angina/ACS. More likely costochondritis. Decrease coreg to 12.5mg  BID given dizziness episodes. Start Amlodipine 5mg  qpm for cardiac spasms. LDL uncontrolled, likely non-adherence to Repatha. Restart repatha, check lipid panel and LFTs in 3 months. May take Naproxen as needed for chest pain for the next couple days.  Wells Guiles, DO  Problem List Items Addressed This Visit       Cardiology Problems   Coronary artery disease involving native coronary artery of native heart with angina pectoris ([place]) - Primary    LAD stent in place, there is also a lot of downstream disease.  She has reason to have anginal symptoms, but I do not think her chest pain sounds anginal it is long-lasting at rest, not necessarily exertional.  I do think is a costochondral component to it.  I there may also be some spasm component.  Plan: Continue Imdur as tolerated-she is taking 90 mg in the morning and 30 mg in the evening. -> We will add amlodipine 5 mg, and reduce carvedilol to 12.5 mg twice daily to allow for blood pressure room. Could also consider Ranexa.  Now on Plavix monotherapy with no recurrent bleeding.  Okay to hold for procedures or surgeries 5 to 7 days preop      Relevant Medications   carvedilol (COREG) 12.5 MG tablet   amLODipine (NORVASC) 5 MG tablet   Chronic heart failure with preserved ejection fraction (HFpEF) (HCC) (Chronic)    No real CHF symptoms.  EF was back to relatively normal by most recent echocardiogram.   Euvolemic on exam.  She is on carvedilol and empagliflozin and not requiring a diuretic.      Relevant Medications   carvedilol (COREG) 12.5 MG tablet   amLODipine (NORVASC) 5 MG tablet   Hyperlipidemia associated with type 2 diabetes mellitus (HCC) (Chronic)    I am not sure what happened to her lipids.  Need to confirm that she is taking her atorvastatin and Repatha.  Recheck lipids.  I do question [place] medication adherence because she also has a significant jump in her A1c. We will probably need to see her in closer follow-up      Relevant Medications   carvedilol (COREG) 12.5 MG tablet   amLODipine (NORVASC) 5 MG tablet     Other   History of ST elevation myocardial infarction (STEMI)    Distal anterior wall seems to pretty much be chronically ischemic with heavily diseased LAD not amenable to PCI.   She had a Myoview done in January 2022 which showed a large fixed LAD distribution infarct, but echo did not show any wall motion abnormality which is unusual.   She has had at least 2 episodes of MI.  Now having chronic intermittent episodes of chest pain that are prolonged but has not had negative troponin levels.  Reluctant to proceed with any further evaluation.   Titrating medications in an attempt to control her symptoms.      Chest pain with moderate risk for cardiac etiology    Not convinced that her pain is anginal in nature.  Somewhat atypical  in the fact that happens at rest, not necessarily made worse with exertion and is very prolonged.  After 30 minutes plus of chest pain actually almost all day troponin level was 4.  Would like to treat for possible costochondritis but also potential coronary spasm. Plan: She cannot take naproxen so we will use 800 mg Motrin which she will take in bursts of 1 tab 3 times daily for 2 days, 1 tab twice daily for 2 days and then 1 tab daily for 2 days.  This would be for possible costochondritis. We will reduce carvedilol dose to 12.5  mg twice daily and add amlodipine 5 mg daily.      Tobacco abuse (Chronic)    Not ready to quit.      Other Visit Diagnoses     Hyperlipidemia LDL goal <70       Relevant Medications   carvedilol (COREG) 12.5 MG tablet   amLODipine (NORVASC) 5 MG tablet   Other Relevant Orders   Lipid panel   Hepatic function panel       ===================================  HPI:    Maria Harper is a 51 y.o. female with a PMH below who presents today for ED follow up after chest pain. Notes chest pain after she went to the gym but has continued to last for several weeks and sometimes accompanied with dizziness which lasts several seconds. States nitroglycerin helps but does not completely relieve it. Dizziness primarily presents when she goes from sitting to standing. Pain does not worsen when she gets up and moves around.   She describes her chest pain as a sharp type pain that, goes down across her chest.  Not necessarily reproducible.  It lasts 30 minutes to all day.  Can happen with rest and not necessarily made worse with exertion.  Sometimes it is bad enough that it takes her breath away.  The episode that took her to the ER was similar to that.  Somewhat difficult to distinguish her symptoms.  Also interestingly, looking at her lipid panel, she had a dramatic increase in LDL despite saying that she is taking her Repatha.  This seems to be confirmed.  She says that she is taking Repatha but was taking it every week for a little while and then every month-apparently she is now back to to taking it every twice a week.  She was post to start her amlodipine at the time I saw her last last year in June, but it never stayed on her list.  I have not seen her since June 2022 and she has been seen several times by APP's.  CV Review of Symptoms (Summary) Cardiovascular ROS: positive for - chest pain;, dizziness negative for - dyspnea on exertion, irregular heartbeat, orthopnea, palpitations,  paroxysmal nocturnal dyspnea, rapid heart rate, or shortness of breath   Maria Harper was last seen on 4/28 for preoperative clearance for colonoscopy. Prior seen on 06/18/21 for  intermittent chest discomfort that occurs randomly that occurs about 2-3 times per week.  Imdur was increased to 90 mg a.m. and 30 mg p.m.  More recently, patient presented to the hospital on 11/24/2021, chest discomfort was felt to be sharp.  She was treated with guaifenesin and prednisone.  Recent Hospitalizations: seen in ED in late August for chest pain.  Reviewed  CV studies:    The following studies were reviewed today: (if available, images/films reviewed: From Epic Chart or Care Everywhere) Echo 03/23/2020: 1. LVEF has improved since the  prior study on 11/24/2019, from 45-50% to 55-60%. There is significant LVH predominantly in the apical segments, a cardiac MRI should be considered to evaluate for possible infiltrative and  hypertrophic cardiomyopathies.   2. Left ventricular ejection fraction, by estimation, is 55 to 60%. The left ventricle has normal function. The left ventricle has no regional wall motion abnormalities. There is moderate concentric left ventricular hypertrophy. Left ventricular diastolic parameters are consistent with Grade I diastolic dysfunction  (impaired relaxation). Elevated left atrial pressure.   3. Right ventricular systolic function is normal. The right ventricular size is normal.   4. The mitral valve is normal in structure. Mild mitral valve  regurgitation. No evidence of mitral stenosis.   5. The aortic valve is tricuspid. Aortic valve regurgitation is moderate. Mild to moderate aortic valve sclerosis/calcification is present, without any evidence of aortic stenosis.   6. The inferior vena cava is normal in size with greater than 50% respiratory variability, suggesting right atrial pressure of 3 mmHg.  Myoperfusion stress scan 08/27/20:  Very poor study due to severe  extracardiac tracer uptake in the gut making imaging essentially uninterpretable. The left ventricular ejection fraction is mildly decreased (45-54%). Nuclear stress EF: 53%. There was no ST segment deviation noted during stress. No T wave inversion was noted during stress. There is a large defect of severe severity present in the basal inferior, mid anterior, mid inferior, mid inferolateral, apical anterior, apical inferior and apex location possibly artifact due to significant extracardiac tracer uptake in the gut. Based on prior TTE, the EF was normal with no wall motion abnormalities, which does not correlate with perfusion on nuclear study. Recommend coronary CTA to better assess possiblity of obstructive CAD.   REVIEWED OF SYSTEMS   Review of Systems  Respiratory:  Negative for shortness of breath.   Cardiovascular:  Positive for chest pain. Negative for palpitations, orthopnea and leg swelling.  Neurological:  Positive for dizziness. Negative for loss of consciousness and weakness.  Easily fatigued.  Not very active.  I have reviewed and (if needed) personally updated the patient's problem list, medications, allergies, past medical and surgical history, social and family history.   PAST MEDICAL HISTORY   Past Medical History:  Diagnosis Date   Acid reflux    Cocaine abuse (HCC)    Coronary artery disease, occlusive 2017   2017 or 2018-PCI to LAD (mid-distal); -> anterior STEMI April 2021 -> LAD stent patent, however distal LAD at D3 occluded, only able to reduce to 70% stenosis.  Small caliber RPDA with 80% stenosis.   Diabetes (HCC)    Heart attack (HCC) 04/2016   High cholesterol    Hypertension    Ischemic cardiomyopathy 11/2019   Neuropathic pain    Stroke (HCC) 03/2020   Tobacco abuse     PAST SURGICAL HISTORY   Past Surgical History:  Procedure Laterality Date   CORONARY/GRAFT ACUTE MI REVASCULARIZATION N/A 11/23/2019   Procedure: CORONARY/GRAFT ACUTE MI  REVASCULARIZATION;  Surgeon: [name], MD;  Location[facility] INVASIVE CV LAB;  Service: Cardiovascular; unsuccessful PTCA of distal LAD 100%.  O able to restore only minimal flow with 70% stenosis   LEFT HEART CATH AND CORONARY ANGIOGRAPHY N/A 11/23/2019   Procedure: LEFT HEART CATH AND CORONARY ANGIOGRAPHY;  Surgeon: [name], MD;  Location: [facility] INVASIVE CV LAB;  Service: Cardiovascular; ANTERIOR STEMI: Widely patent mid to distal LAD stent, distal LAD is 100% stenosed associated with the third diagonal.  (Post PTCA - 70% residual  but 100% D3).  RPDA tapers to a small vessel -80% stenosis.  Severely elevated LVEDP   NM MYOVIEW LTD  08/27/2020   (recurrent chest pain): Poor study due to gut attenuation.  EF ~50-55%.  No EKG changes.  MLSTLY FIXED LARGE/SEVRE perfusion defect in the basal-apical inferior, mid inferolateral & mid-apical anterior / apical walls: Findings are c/w known anatomy (apical LAD occlusion & PDA 80%, with gut attenuation enhancing inferior involvement -> but Echo with normal EF & no RWA - contradicts this finding.   TRANSTHORACIC ECHOCARDIOGRAM  11/24/2019   -post anterior STEMI: EF mildly reduced 45 to 50%.  GR 1 DD.  Elevated LAP.  No obvious heart WMA.  Moderate-severe AI with mild-moderate Aortic Sclerosis but no Stenosis.   TRANSTHORACIC ECHOCARDIOGRAM  03/23/2020   (stroke evaluation): EF improved to 55 to 60%.  Significant LVH-notable apical.  Consider cardiac MRI.  Moderate concentric LVH.  GR 1 DD.  Elevated LAP.  Mild to moderate Aortic Sclerosis but no Stenosis.  Moderate AI.      There is no immunization history on file for this patient.  MEDICATIONS/ALLERGIES   Current Meds  Medication Sig   atorvastatin (LIPITOR) 80 MG tablet TAKE 1 Tablet BY MOUTH ONCE DAILY   buPROPion ER (WELLBUTRIN SR) 100 MG 12 hr tablet Take 100 mg by mouth 2 (two) times daily.   carbamazepine (CARBATROL) 100 MG 12 hr capsule Take 1 capsule (100 mg total) by mouth 2 (two) times  daily.   CLARITIN 10 MG tablet Take 10 mg by mouth daily.   clopidogrel (PLAVIX) 75 MG tablet TAKE 1 Tablet BY MOUTH ONCE DAILY   cyclobenzaprine (FLEXERIL) 10 MG tablet Take 10 mg by mouth 3 (three) times daily as needed for muscle spasms.    doxepin (SINEQUAN) 50 MG capsule Take 50 mg by mouth in the morning, at noon, and at bedtime.   empagliflozin (JARDIANCE) 10 MG TABS tablet Take 1 tablet (10 mg total) by mouth daily before breakfast. KEEP OV.   Evolocumab (REPATHA SURECLICK) [id number] MG/ML SOAJ Inject 140 mg into the skin every 14 (fourteen) days. KEEP OV.   gabapentin (NEURONTIN) 300 MG capsule Take 3 capsules (900 mg total) by mouth 3 (three) times daily.   glipiZIDE (GLUCOTROL) 5 MG tablet Take 5 mg by mouth 2 (two) times daily.   ibuprofen (ADVIL) 800 MG tablet Take 800 mg 3 times daily for 2 days, then decrease to 800 mg two times daily for 2 days, then decrease to 800 mg daily for 2 days, then stop.   Insulin Glargine Solostar (LANTUS) 100 UNIT/ML Solostar Pen Inject 35 Units into the skin daily.   insulin lispro (HUMALOG) 100 UNIT/ML KwikPen Inject 10 Units into the skin in the morning, at noon, and at bedtime.   ipratropium (ATROVENT HFA) 17 MCG/ACT inhaler Inhale 2 puffs into the lungs every 6 (six) hours.   isosorbide mononitrate (IMDUR) 60 MG 24 hr tablet TABLET 1 AND 1/2 tablets BY MOUTH EVERY MORNING AND 1/2 tablet AT BEDTIME   lidocaine (LIDODERM) 5 % Place 1 patch onto the skin daily as needed. Apply patch to area most significant pain once per day.  Remove and discard patch within 12 hours of application.   LINZESS 145 MCG CAPS capsule Take 145 mcg by mouth daily.   nitroGLYCERIN (NITROSTAT) 0.4 MG SL tablet DISSOLVE 1 TABLET UNDER THE TONGUE EVERY 5 MINUTES AS NEEDED FOR CHEST PAIN. DO NOT EXCEED A TOTAL OF 3 DOSES IN 15 MINUTES. CALL  911 IF NO RELIEF.   pantoprazole (PROTONIX) 40 MG tablet Take 1 tablet (40 mg total) by mouth 2 (two) times daily. KEEP OV.   polyethylene glycol  powder (GLYCOLAX/MIRALAX) powder Take 17 g by mouth daily as needed for moderate constipation.    QUEtiapine (SEROQUEL) 400 MG tablet Take 400 mg by mouth at bedtime.   SEROQUEL 200 MG tablet Take 200 mg by mouth daily. In the morning   []  carvedilol (COREG) 25 MG tablet TAKE 1 Tablet BY MOUTH TWICE DAILY with a meal   [DISCONTINUED] naproxen (NAPROSYN) 375 MG tablet Take 1 tablet (375 mg total) by mouth 2 (two) times daily. Does not take    Allergies  Allergen Reactions   Bee Venom Anaphylaxis   Contrast Media [Iodinated Contrast Media] Hives and Itching   Tomato Itching and Rash    SOCIAL HISTORY/FAMILY HISTORY   Reviewed in Epic:  Pertinent findings:  Social History   Tobacco Use   Smoking status: Every Day    Packs/day: 1.50    Years: 20.00    Total pack years: 30.00    Types: Cigarettes   Smokeless tobacco: Never  Vaping Use   Vaping Use: Never used  Substance Use Topics   Alcohol use: Not Currently   Drug use: Not Currently    Types: "Crack" cocaine    Comment: last used yesterday. States " I do it everyday"    Social History   Social History Narrative   Right handed    Caffeine - no more than 2 cups     OBJCTIVE -PE, EKG, labs   Wt Readings from Last 3 Encounters:  04/16/22 158 lb (71.7 kg)  03/22/22 168 lb (76.2 kg)  01/12/22 168 lb 1.6 oz (76.2 kg)    Physical Exam: BP 117/60   Pulse 86   Ht 5\' 2"  (1.575 m)   Wt 158 lb (71.7 kg)   LMP 03/17/2022   SpO2 99%   BMI 28.90 kg/m  Physical Exam Vitals reviewed.  Constitutional:      General: She is not in acute distress.    Appearance: She is normal weight. She is not toxic-appearing.  HENT:     Head: Normocephalic and atraumatic.  Cardiovascular:     Rate and Rhythm: Normal rate and regular rhythm.     Heart sounds: Murmur heard.     Comments: 2/6 holosystolic murmur Pulmonary:     Effort: Pulmonary effort is normal. No respiratory distress.     Breath sounds: Normal breath sounds. No  wheezing, rhonchi or rales.  Chest:     Chest wall: No tenderness (Only 1 focal spot, but not really reproducible of her symptoms.).  Neurological:     General: No focal deficit present.     Mental Status: She is alert and oriented to person, place, and time. Mental status is at baseline.  Psychiatric:        Mood and Affect: Mood normal.        Behavior: Behavior normal.        Thought Content: Thought content normal.        Judgment: Judgment normal.     Comments: Very much fixated on her chest discomfort     Adult ECG Report  Not performed  Recent Labs:   Marland Kitchen  Troponins during recent ER visit were negative.  She has had several evaluations with troponins for chest pain all of been completely negative.  With prolonged chest pain and negative troponin, very unlikely be  cardiac in nature. 12/30/2021: TC 201, TG 107, HDL 67, LDL 115.;  A1c 11.2. Lab Results  Component Value Date   CHOL 108 01/26/2021   HDL 43 01/26/2021   LDLCALC 28 01/26/2021   TRIG 241 (H) 01/26/2021   CHOLHDL 2.5 01/26/2021   Lab Results  Component Value Date   CREATININE 1.02 (H) 03/22/2022   BUN 15 03/22/2022   NA 134 (L) 03/22/2022   K 3.5 03/22/2022   CL 106 03/22/2022   CO2 16 (L) 03/22/2022      Latest Ref Rng & Units 03/22/2022    6:33 PM 12/01/2021   11:19 AM 11/24/2021   11:16 AM  CBC  WBC 4.0 - 10.5 K/uL 7.4  7.1  12.5   Hemoglobin 12.0 - 15.0 g/dL 10.7  14.7  12.5   Hematocrit 36.0 - 46.0 % 34.4  44.3  38.7   Platelets 150 - 400 K/uL 170  226  201     Lab Results  Component Value Date   HGBA1C 9.1 (H) 03/23/2020   No results found for: "TSH"  ================================================== Resident spent a total of 15 minutes with the patient spent in direct patient consultation.  Additional 10 minutes with attending Additional time spent with chart review  / charting (studies, outside notes, etc): 10 min; 9 additional minutes of attending. Total Time: 25 + 19 = total time 44 minutes.     Current medicines are reviewed at length with the patient today.  (+/- concerns)   Notice: This dictation was prepared with Dragon dictation along with smart phrase technology. Any transcriptional errors that result from this process are unintentional and may not be corrected upon review.  Studies Ordered:   Orders Placed This Encounter  Procedures   Lipid panel   Hepatic function panel   Meds ordered this encounter  Medications   carvedilol (COREG) 12.5 MG tablet    Sig: Take 1 tablet (12.5 mg total) by mouth 2 (two) times daily with a meal.    Dispense:  180 tablet    Refill:  3    Dose decrease   amLODipine (NORVASC) 5 MG tablet    Sig: Take 1 tablet (5 mg total) by mouth daily.    Dispense:  90 tablet    Refill:  3   ibuprofen (ADVIL) 800 MG tablet    Sig: Take 800 mg 3 times daily for 2 days, then decrease to 800 mg two times daily for 2 days, then decrease to 800 mg daily for 2 days, then stop.    Dispense:  12 tablet    Refill:  1    Patient Instructions / Medication Changes & Studies & Tests Ordered   Patient Instructions  Medication Instructions:  DECREASE carvedilol (Coreg) to 12.5 mg two times daily  START amlodipine 5 mg daily at night  Take ibuprofen 800 mg three times daily for 2 days, then two times daily for 2 days, then daily for 2 days.    *If you need a refill on your cardiac medications before your next appointment, please call your pharmacy*   Lab Work: Please return for FASTING labs in December (Lipid, Hepatic)  Our in office lab hours are Monday-Friday 8:00-4:00, closed for lunch 12:45-1:45 pm.  No appointment needed.  [facility] locations:   [address] (Dr. Allison Quarry office) - [place] ([facility]) - [id number] [place] -  [place]  [address] - [address] [place] - [address] ([facility])  Follow-Up: At [facility], you and your health needs are our priority.  As part of our continuing mission to provide you with exceptional heart care, we have created designated Provider Care Teams.  These Care Teams include your primary Cardiologist (physician) and Advanced Practice Providers (APPs -  Physician Assistants and Nurse Practitioners) who all work together to provide you with the care you need, when you need it.  We recommend signing up for the patient portal called "MyChart".  Sign up information is provided on this After Visit Summary.  MyChart is used to connect with patients for Virtual Visits (Telemedicine).  Patients are able to view lab/test results, encounter notes, upcoming appointments, etc.  Non-urgent messages can be sent to your provider as well.   To learn more about what you can do with MyChart, go to [email or web address].    Your next appointment:   3 month(s)  The format for your next appointment:   In Person  Provider:   Glenetta Hew, MD or [facility]       Wells Guiles, DO 04/16/2022, 9:40 PM PGY-2, [place]  I have seen, examined and evaluated the patient along with the Resident Physician in clinic today.  I personally performed my own interview & exanimation.  After reviewing all the available data and chart, we discussed the patients laboratory, study & physical findings as well as symptoms in detail. I agree with his findings, examination as well as impression recommendations as per our discussion.    Attending adjustments int the full clinic noted annotated in [place].     Leonie Man, MD, MS Glenetta Hew, M.D., M.S. Interventional Cardiologist  [place]  Pager # [phone] Phone # [phone] [address] [place], [place]  [address]

## 2022-04-16 NOTE — Assessment & Plan Note (Signed)
Not ready to quit.  

## 2022-04-16 NOTE — Progress Notes (Signed)
ATTENDING NOTE    I have seen, examined and evaluated the patient along with the Resident Physician in clinic today.  I personally performed my own interview & exanimation.  After reviewing all the available data and chart, we discussed the patients laboratory, study & physical findings as well as symptoms in detail. I agree with HIS findings, examination as well as impression recommendations as per our discussion.    Attending adjustments int the full clinic noted annotated in italics in the original note.  Patient is here for ER follow-up with chest pain complaints and dizziness.  Chest pain does not sound cardiac in nature.  However we will treat for possible costochondritis but also coronary spasm.  Problem List Items Addressed This Visit       Cardiology Problems   Coronary artery disease involving native coronary artery of native heart with angina pectoris (HCC) - Primary    LAD stent in place, there is also a lot of downstream disease.  She has reason to have anginal symptoms, but I do not think her chest pain sounds anginal it is long-lasting at rest, not necessarily exertional.  I do think is a costochondral component to it.  I there may also be some spasm component.  Plan: Continue Imdur as tolerated-she is taking 90 mg in the morning and 30 mg in the evening. -> We will add amlodipine 5 mg, and reduce carvedilol to 12.5 mg twice daily to allow for blood pressure room. Could also consider Ranexa.  Now on Plavix monotherapy with no recurrent bleeding.  Okay to hold for procedures or surgeries 5 to 7 days preop      Relevant Medications   carvedilol (COREG) 12.5 MG tablet   amLODipine (NORVASC) 5 MG tablet   Chronic heart failure with preserved ejection fraction (HFpEF) (HCC) (Chronic)    No real CHF symptoms.  EF was back to relatively normal by most recent echocardiogram.  Euvolemic on exam.  She is on carvedilol and empagliflozin and not requiring a diuretic.      Relevant  Medications   carvedilol (COREG) 12.5 MG tablet   amLODipine (NORVASC) 5 MG tablet   Hyperlipidemia associated with type 2 diabetes mellitus (HCC) (Chronic)    I am not sure what happened to her lipids.  Need to confirm that she is taking her atorvastatin and Repatha.  Recheck lipids.  I do question [place] medication adherence because she also has a significant jump in her A1c. We will probably need to see her in closer follow-up      Relevant Medications   carvedilol (COREG) 12.5 MG tablet   amLODipine (NORVASC) 5 MG tablet     Other   History of ST elevation myocardial infarction (STEMI)    Distal anterior wall seems to pretty much be chronically ischemic with heavily diseased LAD not amenable to PCI.   She had a Myoview done in January 2022 which showed a large fixed LAD distribution infarct, but echo did not show any wall motion abnormality which is unusual.   She has had at least 2 episodes of MI.  Now having chronic intermittent episodes of chest pain that are prolonged but has not had negative troponin levels.  Reluctant to proceed with any further evaluation.   Titrating medications in an attempt to control her symptoms.      Chest pain with moderate risk for cardiac etiology    Not convinced that her pain is anginal in nature.  Somewhat atypical in the fact that  happens at rest, not necessarily made worse with exertion and is very prolonged.  After 30 minutes plus of chest pain actually almost all day troponin level was 4.  Would like to treat for possible costochondritis but also potential coronary spasm. Plan: She cannot take naproxen so we will use 800 mg Motrin which she will take in bursts of 1 tab 3 times daily for 2 days, 1 tab twice daily for 2 days and then 1 tab daily for 2 days.  This would be for possible costochondritis. We will reduce carvedilol dose to 12.5 mg twice daily and add amlodipine 5 mg daily.      Tobacco abuse (Chronic)    Not ready to quit.       Other Visit Diagnoses     Hyperlipidemia LDL goal <70       Relevant Medications   carvedilol (COREG) 12.5 MG tablet   amLODipine (NORVASC) 5 MG tablet   Other Relevant Orders   Lipid panel   Hepatic function panel       Close follow-up-3 to 4 months.   Bryan Lemma, MD

## 2022-04-16 NOTE — Assessment & Plan Note (Signed)
No real CHF symptoms.  EF was back to relatively normal by most recent echocardiogram.  Euvolemic on exam.  She is on carvedilol and empagliflozin and not requiring a diuretic.

## 2022-04-16 NOTE — Assessment & Plan Note (Signed)
Distal anterior wall seems to pretty much be chronically ischemic with heavily diseased LAD not amenable to PCI.   She had a Myoview done in January 2022 which showed a large fixed LAD distribution infarct, but echo did not show any wall motion abnormality which is unusual.   She has had at least 2 episodes of MI.  Now having chronic intermittent episodes of chest pain that are prolonged but has not had negative troponin levels.  Reluctant to proceed with any further evaluation.   Titrating medications in an attempt to control her symptoms.

## 2022-04-16 NOTE — Patient Instructions (Addendum)
Medication Instructions:  DECREASE carvedilol (Coreg) to 12.5 mg two times daily  START amlodipine 5 mg daily at night  Take ibuprofen 800 mg three times daily for 2 days, then two times daily for 2 days, then daily for 2 days.    *If you need a refill on your cardiac medications before your next appointment, please call your pharmacy*   Lab Work: Please return for FASTING labs in December (Lipid, Hepatic)  Our in office lab hours are Monday-Friday 8:00-4:00, closed for lunch 12:45-1:45 pm.  No appointment needed.  [facility] locations:   [facility] - [address] (Dr. Elissa Hefty office) - [address] ([facility]) - [address] [phone] [address]   [place] - [address]    [place]  - [address]    [place] - [address] - [address] [employer] [facility]  - [address] [place] - [address] ([facility])  Follow-Up: At [facility], you and your health needs are our priority.  As part of our continuing mission to provide you with exceptional heart care, we have created designated Provider Care Teams.  These Care Teams include your primary Cardiologist (physician) and Advanced Practice Providers (APPs -  Physician Assistants and Nurse Practitioners) who all work together to provide you with the care you need, when you need it.  We recommend signing up for the patient portal called "MyChart".  Sign up information is provided on this After Visit Summary.  MyChart is used to connect with patients for Virtual Visits (Telemedicine).  Patients are able to view lab/test results, encounter notes, upcoming appointments, etc.  Non-urgent messages can be sent to your provider as well.   To learn more about what you can do with MyChart, go to [email or web address].    Your next appointment:   3 month(s)  The format for your next  appointment:   In Person  Provider:   Bryan Lemma, MD or Azalee Course PA

## 2022-04-16 NOTE — Assessment & Plan Note (Signed)
LAD stent in place, there is also a lot of downstream disease.  She has reason to have anginal symptoms, but I do not think her chest pain sounds anginal it is long-lasting at rest, not necessarily exertional.  I do think is a costochondral component to it.  I there may also be some spasm component.  Plan: Continue Imdur as tolerated-she is taking 90 mg in the morning and 30 mg in the evening. -> We will add amlodipine 5 mg, and reduce carvedilol to 12.5 mg twice daily to allow for blood pressure room. Could also consider Ranexa.  Now on Plavix monotherapy with no recurrent bleeding.  Okay to hold for procedures or surgeries 5 to 7 days preop

## 2022-04-16 NOTE — Assessment & Plan Note (Signed)
Not convinced that her pain is anginal in nature.  Somewhat atypical in the fact that happens at rest, not necessarily made worse with exertion and is very prolonged.  After 30 minutes plus of chest pain actually almost all day troponin level was 4.  Would like to treat for possible costochondritis but also potential coronary spasm. Plan:  She cannot take naproxen so we will use 800 mg Motrin which she will take in bursts of 1 tab 3 times daily for 2 days, 1 tab twice daily for 2 days and then 1 tab daily for 2 days.  This would be for possible costochondritis.  We will reduce carvedilol dose to 12.5 mg twice daily and add amlodipine 5 mg daily.

## 2022-04-16 NOTE — Addendum Note (Signed)
Addended by: [name] on: 04/16/2022 09:43 PM   Modules accepted: Level of Service

## 2022-04-20 NOTE — Patient Instructions (Incomplete)
Below is our plan:  We will continue gabapentin 800mg  three times daily. I will update labs, today. We will plan to increase carbamazepine to 150mg  three times daily if labs are stable. I do not recommend regular use of ibuprofen. You are on Plavix daily. These medicaitons should not be taken together. I have placed a referral to pain management as well.   Please make sure you are staying well hydrated. I recommend 50-60 ounces daily. Well balanced diet and regular exercise encouraged. Consistent sleep schedule with 6-8 hours recommended.   Please continue follow up with care team as directed.   Follow up with me pending labs and referral   You may receive a survey regarding today's visit. I encourage you to leave honest feed back as I do use this information to improve patient care. Thank you for seeing me today!

## 2022-04-20 NOTE — Progress Notes (Unsigned)
No chief complaint on file.   HISTORY OF PRESENT ILLNESS:  04/20/22 ALL:  Maria Harper returns for follow up for post stroke pain. She was last seen 11/2021 and advised to continue gabapentin 900mg  TID and carbamazepine 100mg  BID. Since,   12/01/2021 ALL: Maria Harper is a 51 y.o. female here today for follow up for pain following stroke in 03/2020. She was seen by Dr 54 08/2021. She reported right face, arm, leg numbness, tingling and pain since stroke. She had been on gabapentin up to 600mg  TID but felt it was ineffective. She was advised to increase to 900mg  TID. Her husband called 09/17/2021 to report continued pain and multiple falls. Gabapentin had been continued at 600mg  TID and they were reminded to increase dose to 900mg  TID. He called again 11/19/2021 with no improvement. Patient was advised to add carbamazepine 100mg  BID.   Per Epic med list and PDMP review, she was started on pregabalin 50mg  TID by Dr , PCP 09/25/2021. She has filled 09/25/2021 and 10/23/2021. She reports that she discontinued this about a month ago as it was ineffective.   She continues to have burning pain/numbness of right side including face, arm, torso and leg. No specific area that is worse. She has not started carbamazepine. She did not hear back from pharmacy. Now using [facility]. She is not aware of any difficulty with low sodium levels in the past. Review of Epic shows two readings of 133 and 134 with all other readings in normal range, most recent level was 138.     HISTORY (copied from Dr previous note)  51 year old female here for evaluation of right-sided numbness.  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  She has been on gabapentin 300 mg 3 times a day without relief.  This was increased to 600 mg 3 times a day 1 week ago, which patient took for few days and now has stopped.  Patient did not feel  any benefit from gabapentin.     REVIEW OF SYSTEMS: Out of a complete 14 system review of symptoms, the patient complains only of the following symptoms, chronic pain, fatigue, anxiety, and all other reviewed systems are negative.   ALLERGIES: Allergies  Allergen Reactions   Bee Venom Anaphylaxis   Contrast Media [Iodinated Contrast Media] Hives and Itching   Tomato Itching and Rash     HOME MEDICATIONS: Outpatient Medications Prior to Visit  Medication Sig Dispense Refill   amLODipine (NORVASC) 5 MG tablet Take 1 tablet (5 mg total) by mouth daily. 90 tablet 3   atorvastatin (LIPITOR) 80 MG tablet TAKE 1 Tablet BY MOUTH ONCE DAILY 90 tablet 3   buPROPion ER (WELLBUTRIN SR) 100 MG 12 hr tablet Take 100 mg by mouth 2 (two) times daily.     carbamazepine (CARBATROL) 100 MG 12 hr capsule Take 1 capsule (100 mg total) by mouth 2 (two) times daily. 60 capsule 6   carvedilol (COREG) 12.5 MG tablet Take 1 tablet (12.5 mg total) by mouth 2 (two) times daily with a meal. 180 tablet 3   CLARITIN 10 MG tablet Take 10 mg by mouth daily.     clopidogrel (PLAVIX) 75 MG tablet TAKE 1 Tablet BY MOUTH ONCE DAILY 60 tablet 0   cyclobenzaprine (FLEXERIL) 10 MG tablet Take 10 mg by mouth 3 (three) times daily as needed for muscle spasms.  doxepin (SINEQUAN) 50 MG capsule Take 50 mg by mouth in the morning, at noon, and at bedtime.     empagliflozin (JARDIANCE) 10 MG TABS tablet Take 1 tablet (10 mg total) by mouth daily before breakfast. KEEP OV. 90 tablet 0   Evolocumab (REPATHA SURECLICK) 140 MG/ML SOAJ Inject 140 mg into the skin every 14 (fourteen) days. KEEP OV. 6 mL 0   gabapentin (NEURONTIN) 300 MG capsule Take 3 capsules (900 mg total) by mouth 3 (three) times daily. 270 capsule 12   glipiZIDE (GLUCOTROL) 5 MG tablet Take 5 mg by mouth 2 (two) times daily.     ibuprofen (ADVIL) 800 MG tablet Take 800 mg 3 times daily for 2 days, then decrease to 800 mg two times daily for 2 days, then  decrease to 800 mg daily for 2 days, then stop. 12 tablet 1   Insulin Glargine Solostar (LANTUS) 100 UNIT/ML Solostar Pen Inject 35 Units into the skin daily.     insulin lispro (HUMALOG) 100 UNIT/ML KwikPen Inject 10 Units into the skin in the morning, at noon, and at bedtime.     ipratropium (ATROVENT HFA) 17 MCG/ACT inhaler Inhale 2 puffs into the lungs every 6 (six) hours.     isosorbide mononitrate (IMDUR) 60 MG 24 hr tablet TABLET 1 AND 1/2 tablets BY MOUTH EVERY MORNING AND 1/2 tablet AT BEDTIME 180 tablet 3   lidocaine (LIDODERM) 5 % Place 1 patch onto the skin daily as needed. Apply patch to area most significant pain once per day.  Remove and discard patch within 12 hours of application. 15 patch 0   LINZESS 145 MCG CAPS capsule Take 145 mcg by mouth daily.     nitroGLYCERIN (NITROSTAT) 0.4 MG SL tablet DISSOLVE 1 TABLET UNDER THE TONGUE EVERY 5 MINUTES AS NEEDED FOR CHEST PAIN. DO NOT EXCEED A TOTAL OF 3 DOSES IN 15 MINUTES. CALL 911 IF NO RELIEF. 25 tablet PRN   pantoprazole (PROTONIX) 40 MG tablet Take 1 tablet (40 mg total) by mouth 2 (two) times daily. KEEP OV. 180 tablet 0   polyethylene glycol powder (GLYCOLAX/MIRALAX) powder Take 17 g by mouth daily as needed for moderate constipation.      QUEtiapine (SEROQUEL) 400 MG tablet Take 400 mg by mouth at bedtime.     SEROQUEL 200 MG tablet Take 200 mg by mouth daily. In the morning     No facility-administered medications prior to visit.     PAST MEDICAL HISTORY: Past Medical History:  Diagnosis Date   Acid reflux    Cocaine abuse (HCC)    Coronary artery disease, occlusive 2017   2017 or 2018-PCI to LAD (mid-distal); -> anterior STEMI April 2021 -> LAD stent patent, however distal LAD at D3 occluded, only able to reduce to 70% stenosis.  Small caliber RPDA with 80% stenosis.   Diabetes (HCC)    Heart attack (HCC) 04/2016   High cholesterol    Hypertension    Ischemic cardiomyopathy 11/2019   Neuropathic pain    Stroke  (HCC) 03/2020   Tobacco abuse      PAST SURGICAL HISTORY: Past Surgical History:  Procedure Laterality Date   CORONARY/GRAFT ACUTE MI REVASCULARIZATION N/A 11/23/2019   Procedure: CORONARY/GRAFT ACUTE MI REVASCULARIZATION;  Surgeon: [name], MD;  Location[facility] INVASIVE CV LAB;  Service: Cardiovascular; unsuccessful PTCA of distal LAD 100%.  O able to restore only minimal flow with 70% stenosis   LEFT HEART CATH AND CORONARY ANGIOGRAPHY N/A 11/23/2019  Procedure: LEFT HEART CATH AND CORONARY ANGIOGRAPHY;  Surgeon: [name], MD;  Location: [facility] INVASIVE CV LAB;  Service: Cardiovascular; ANTERIOR STEMI: Widely patent mid to distal LAD stent, distal LAD is 100% stenosed associated with the third diagonal.  (Post PTCA - 70% residual but 100% D3).  RPDA tapers to a small vessel -80% stenosis.  Severely elevated LVEDP   NM MYOVIEW LTD  08/27/2020   (recurrent chest pain): Poor study due to gut attenuation.  EF ~50-55%.  No EKG changes.  MLSTLY FIXED LARGE/SEVRE perfusion defect in the basal-apical inferior, mid inferolateral & mid-apical anterior / apical walls: Findings are c/w known anatomy (apical LAD occlusion & PDA 80%, with gut attenuation enhancing inferior involvement -> but Echo with normal EF & no RWA - contradicts this finding.   TRANSTHORACIC ECHOCARDIOGRAM  11/24/2019   -post anterior STEMI: EF mildly reduced 45 to 50%.  GR 1 DD.  Elevated LAP.  No obvious heart WMA.  Moderate-severe AI with mild-moderate Aortic Sclerosis but no Stenosis.   TRANSTHORACIC ECHOCARDIOGRAM  03/23/2020   (stroke evaluation): EF improved to 55 to 60%.  Significant LVH-notable apical.  Consider cardiac MRI.  Moderate concentric LVH.  GR 1 DD.  Elevated LAP.  Mild to moderate Aortic Sclerosis but no Stenosis.  Moderate AI.      FAMILY HISTORY: Family History  Problem Relation Age of Onset   Hypertension Mother    Diabetes Mother    Hypertension Father    Diabetes Father    Diabetes Maternal  Grandmother    Diabetes Maternal Grandfather      SOCIAL HISTORY: Social History   Socioeconomic History   Marital status: Married    Spouse name: Not on file   Number of children: 0   Years of education: 6 th   Highest education level: Not on file  Occupational History   Not on file  Tobacco Use   Smoking status: Every Day    Packs/day: 1.50    Years: 20.00    Total pack years: 30.00    Types: Cigarettes   Smokeless tobacco: Never  Vaping Use   Vaping Use: Never used  Substance and Sexual Activity   Alcohol use: Not Currently   Drug use: Not Currently    Types: "Crack" cocaine    Comment: last used yesterday. States " I do it everyday"    Sexual activity: Not on file  Other Topics Concern   Not on file  Social History Narrative   Right handed    Caffeine - no more than 2 cups    Social Determinants of Health   Financial Resource Strain: Not on file  Food Insecurity: Not on file  Transportation Needs: Not on file  Physical Activity: Not on file  Stress: Not on file  Social Connections: Not on file  Intimate Partner Violence: Not on file     PHYSICAL EXAM  There were no vitals filed for this visit.  There is no height or weight on file to calculate BMI.  Generalized: Well developed, in no acute distress  Cardiology: normal rate and rhythm, no murmur auscultated  Respiratory: clear to auscultation bilaterally    Neurological examination  Mentation: Alert oriented to time, place, history taking. Follows all commands speech and language fluent Cranial nerve II-XII: Pupils were equal round reactive to light. Extraocular movements were full, visual field were full on confrontational test. Facial sensation and strength were normal. Head turning and shoulder shrug  were normal and symmetric. Motor:  The motor testing reveals 5 over 5 strength of all 4 extremities. Good symmetric motor tone is noted throughout.  Sensory: Sensory testing is intact to soft touch  and pinprick on left, increased sensitivity to soft touch and pinprick of right upper ext, absent sensation reported to both on right lower ext.  No evidence of extinction is noted.  Coordination: Cerebellar testing reveals good finger-nose-finger and heel-to-shin bilaterally.  Gait and station: Station normal. Gait is arrow but stable with no assistive device. Reflexes: Deep tendon reflexes are symmetric and normal bilaterally.    DIAGNOSTIC DATA (LABS, IMAGING, TESTING) - I reviewed patient records, labs, notes, testing and imaging myself where available.  Lab Results  Component Value Date   WBC 7.4 03/22/2022   HGB 10.7 (L) 03/22/2022   HCT 34.4 (L) 03/22/2022   MCV 92.5 03/22/2022   PLT 170 03/22/2022      Component Value Date/Time   NA 134 (L) 03/22/2022 1833   NA 139 12/01/2021 1119   K 3.5 03/22/2022 1833   CL 106 03/22/2022 1833   CO2 16 (L) 03/22/2022 1833   GLUCOSE 241 (H) 03/22/2022 1833   BUN 15 03/22/2022 1833   BUN 19 12/01/2021 1119   CREATININE 1.02 (H) 03/22/2022 1833   CALCIUM 8.7 (L) 03/22/2022 1833   PROT 7.6 12/01/2021 1119   ALBUMIN 4.5 12/01/2021 1119   AST 10 12/01/2021 1119   ALT 8 12/01/2021 1119   ALKPHOS 89 12/01/2021 1119   BILITOT <0.2 12/01/2021 1119   GFRNONAA >60 03/22/2022 1833   GFRAA >60 03/23/2020 1757   Lab Results  Component Value Date   CHOL 108 01/26/2021   HDL 43 01/26/2021   LDLCALC 28 01/26/2021   TRIG 241 (H) 01/26/2021   CHOLHDL 2.5 01/26/2021   Lab Results  Component Value Date   HGBA1C 9.1 (H) 03/23/2020   No results found for: "VITAMINB12" No results found for: "TSH"      No data to display               No data to display           ASSESSMENT AND PLAN  51 y.o. year old female  has a past medical history of Acid reflux, Cocaine abuse ([place]), Coronary artery disease, occlusive (2017), Diabetes ([place]), Heart attack ([place]) (04/2016), High cholesterol, Hypertension, Ischemic cardiomyopathy (11/2019),  Neuropathic pain, Stroke ([place]) (03/2020), and Tobacco abuse. here with    No diagnosis found.  Maria Harper continues to have numbness and burning pain of the entire right side of her body post thalamic stroke in 2021. Gabapentin 900mg  TID has provided little relief. She was advised to start carbamazepine as directed by Dr Leta Baptist pending updated labs drawn today. Assuming sodium is stable (history of low sodium but recent readings normal, last 138), I will have her start carbamazepine 100mg  every night for 1 week. If well tolerated, she will increase dose to 100mg  twice daily for 1 week then return for repeat sodium. Will continue 100mg  BID pending normal sodium level and tolerability. She may wean gabapentin as discussed if not effective. Healthy lifestyle habits encouraged. She will follow up with PCP as directed. She will return to see me in 4-5 months, sooner if needed. She verbalizes understanding and agreement with this plan.    No orders of the defined types were placed in this encounter.    No orders of the defined types were placed in this encounter.   I spent 30 minutes of  face-to-face and non-face-to-face time with patient.  This included previsit chart review, lab review, study review, order entry, electronic health record documentation, patient education.   Shawnie Dapper, MSN, FNP-C 04/20/2022, 9:05 AM  [facility] [address] [place], [place] [address] [phone]

## 2022-04-21 ENCOUNTER — Ambulatory Visit (INDEPENDENT_AMBULATORY_CARE_PROVIDER_SITE_OTHER): Payer: Medicaid Other | Admitting: Family Medicine

## 2022-04-21 ENCOUNTER — Emergency Department (HOSPITAL_COMMUNITY): Payer: Medicaid Other

## 2022-04-21 ENCOUNTER — Encounter (HOSPITAL_COMMUNITY): Payer: Self-pay | Admitting: Emergency Medicine

## 2022-04-21 ENCOUNTER — Encounter: Payer: Self-pay | Admitting: Family Medicine

## 2022-04-21 ENCOUNTER — Emergency Department (HOSPITAL_COMMUNITY)
Admission: EM | Admit: 2022-04-21 | Discharge: 2022-04-21 | Disposition: A | Discharge status: Home / Self Care | Payer: Medicaid Other | Attending: Emergency Medicine | Admitting: Emergency Medicine

## 2022-04-21 ENCOUNTER — Other Ambulatory Visit: Payer: Self-pay

## 2022-04-21 ENCOUNTER — Other Ambulatory Visit: Payer: Self-pay | Admitting: Cardiology

## 2022-04-21 VITALS — BP 137/83 | HR 116 | Ht 62.0 in | Wt 154.6 lb

## 2022-04-21 DIAGNOSIS — N9489 Other specified conditions associated with female genital organs and menstrual cycle: Secondary | ICD-10-CM | POA: Diagnosis not present

## 2022-04-21 DIAGNOSIS — I6381 Other cerebral infarction due to occlusion or stenosis of small artery: Secondary | ICD-10-CM | POA: Diagnosis not present

## 2022-04-21 DIAGNOSIS — R051 Acute cough: Secondary | ICD-10-CM

## 2022-04-21 DIAGNOSIS — Z79899 Other long term (current) drug therapy: Secondary | ICD-10-CM | POA: Insufficient documentation

## 2022-04-21 DIAGNOSIS — G890 Central pain syndrome: Secondary | ICD-10-CM

## 2022-04-21 DIAGNOSIS — U071 COVID-19: Secondary | ICD-10-CM | POA: Diagnosis not present

## 2022-04-21 DIAGNOSIS — Z7902 Long term (current) use of antithrombotics/antiplatelets: Secondary | ICD-10-CM | POA: Diagnosis not present

## 2022-04-21 DIAGNOSIS — R509 Fever, unspecified: Secondary | ICD-10-CM | POA: Diagnosis present

## 2022-04-21 LAB — URINALYSIS, ROUTINE W REFLEX MICROSCOPIC
Bilirubin Urine: NEGATIVE
Glucose, UA: >=500 mg/dL — AB
Ketones, ur: 20 mg/dL — AB
Nitrite: NEGATIVE
Protein, ur: 100 mg/dL — AB
Specific Gravity, Urine: 1.029 (ref 1.005–1.030)
pH: 6 (ref 5.0–8.0)

## 2022-04-21 LAB — CBC WITH DIFFERENTIAL/PLATELET
Abs Immature Granulocytes: 0.05 10*3/uL (ref 0.00–0.07)
Basophils Absolute: 0 10*3/uL (ref 0.0–0.1)
Basophils Relative: 0 %
Eosinophils Absolute: 0 10*3/uL (ref 0.0–0.5)
Eosinophils Relative: 0 %
HCT: 36.4 % (ref 36.0–46.0)
Hemoglobin: 10.9 g/dL — ABNORMAL LOW (ref 12.0–15.0)
Immature Granulocytes: 1 %
Lymphocytes Relative: 9 %
Lymphs Abs: 0.8 10*3/uL (ref 0.7–4.0)
MCH: 27.3 pg (ref 26.0–34.0)
MCHC: 29.9 g/dL — ABNORMAL LOW (ref 30.0–36.0)
MCV: 91 fL (ref 80.0–100.0)
Monocytes Absolute: 0.8 10*3/uL (ref 0.1–1.0)
Monocytes Relative: 9 %
Neutro Abs: 7.4 10*3/uL (ref 1.7–7.7)
Neutrophils Relative %: 81 %
Platelets: 209 10*3/uL (ref 150–400)
RBC: 4 MIL/uL (ref 3.87–5.11)
RDW: 16.3 % — ABNORMAL HIGH (ref 11.5–15.5)
WBC: 9 10*3/uL (ref 4.0–10.5)
nRBC: 0 % (ref 0.0–0.2)

## 2022-04-21 LAB — COMPREHENSIVE METABOLIC PANEL
ALT: 13 U/L (ref 0–44)
AST: 15 U/L (ref 15–41)
Albumin: 3.9 g/dL (ref 3.5–5.0)
Alkaline Phosphatase: 70 U/L (ref 38–126)
Anion gap: 13 (ref 5–15)
BUN: 13 mg/dL (ref 6–20)
CO2: 18 mmol/L — ABNORMAL LOW (ref 22–32)
Calcium: 9.1 mg/dL (ref 8.9–10.3)
Chloride: 105 mmol/L (ref 98–111)
Creatinine, Ser: 0.92 mg/dL (ref 0.44–1.00)
GFR, Estimated: >60 mL/min (ref >60)
Glucose, Bld: 222 mg/dL — ABNORMAL HIGH (ref 70–99)
Potassium: 3.2 mmol/L — ABNORMAL LOW (ref 3.5–5.1)
Sodium: 136 mmol/L (ref 135–145)
Total Bilirubin: 0.4 mg/dL (ref 0.3–1.2)
Total Protein: 8 g/dL (ref 6.5–8.1)

## 2022-04-21 LAB — APTT: aPTT: 29 seconds (ref 24–36)

## 2022-04-21 LAB — I-STAT BETA HCG BLOOD, ED (MC, WL, AP ONLY): I-stat hCG, quantitative: <5 m[IU]/mL (ref <5)

## 2022-04-21 LAB — PROTIME-INR
INR: 1 (ref 0.8–1.2)
Prothrombin Time: 13.5 seconds (ref 11.4–15.2)

## 2022-04-21 LAB — LACTIC ACID, PLASMA: Lactic Acid, Venous: 1.9 mmol/L (ref 0.5–1.9)

## 2022-04-21 LAB — SARS CORONAVIRUS 2 BY RT PCR: SARS Coronavirus 2 by RT PCR: POSITIVE — AB

## 2022-04-21 MED ORDER — ACETAMINOPHEN 325 MG PO TABS
650.0000 mg | ORAL_TABLET | Freq: Once | ORAL | Status: AC
  Administered 2022-04-21: 650 mg via ORAL
  Filled 2022-04-21: qty 2

## 2022-04-21 MED ORDER — NIRMATRELVIR/RITONAVIR (PAXLOVID)TABLET: 3.0000 | ORAL_TABLET | Freq: Two times a day (BID) | ORAL | 0 refills | Status: AC

## 2022-04-21 MED ORDER — SODIUM CHLORIDE 0.9 % IV BOLUS
1000.0000 mL | Freq: Once | INTRAVENOUS | Status: AC
  Administered 2022-04-21: 1000 mL via INTRAVENOUS

## 2022-04-21 MED ORDER — POTASSIUM CHLORIDE CRYS ER 20 MEQ PO TBCR
40.0000 meq | EXTENDED_RELEASE_TABLET | Freq: Once | ORAL | Status: DC
  Filled 2022-04-21: qty 2

## 2022-04-21 MED ORDER — POTASSIUM CHLORIDE 20 MEQ PO PACK
40.0000 meq | PACK | Freq: Once | ORAL | Status: AC
  Administered 2022-04-21: 40 meq via ORAL

## 2022-04-21 MED ORDER — ONDANSETRON 4 MG PO TBDP: 4.0000 mg | ORAL_TABLET | Freq: Three times a day (TID) | ORAL | 0 refills | Status: DC | PRN

## 2022-04-21 MED ORDER — POTASSIUM CHLORIDE 20 MEQ PO PACK
40.0000 meq | PACK | Freq: Two times a day (BID) | ORAL | Status: DC
  Filled 2022-04-21: qty 2

## 2022-04-21 NOTE — ED Provider Notes (Signed)
[facility] DEPT Provider Note   CSN: [id number] Arrival date & time: 04/21/22  1320     History  Chief Complaint  Patient presents with   Generalized Body Aches   Cough   Chills    Maria Harper is a 51 y.o. female.  Patient seen by myself on arrival in triage.  Pt complains of fever and cough starting about 3 days ago, worsening.  She has a history of stroke with residual right-sided weakness.  She states that she has had a couple of previous episodes of pneumonia in her life and that this feels similar.  She has associated shortness of breath.  States that heart rate was elevated this morning when she went to see her neurologist.  This was a routine appointment in regards to her previous stroke.  She has had chest pain as well with coughing.  No lower extremity swelling.  No nausea, vomiting, or diarrhea.  No known sick contacts.  Denies previous COVID infection, but did have vaccines when they first came out.       Home Medications Prior to Admission medications   Medication Sig Start Date End Date Taking? Authorizing Provider  amLODipine (NORVASC) 5 MG tablet Take 1 tablet (5 mg total) by mouth daily. 04/16/22 04/11/23  Leonie Man, MD  atorvastatin (LIPITOR) 80 MG tablet TAKE 1 Tablet BY MOUTH ONCE DAILY 11/04/21   Leonie Man, MD  buPROPion [facility] SR) 100 MG 12 hr tablet Take 100 mg by mouth 2 (two) times daily. 11/10/21   [provider]  carbamazepine (CARBATROL) 100 MG 12 hr capsule Take 1 capsule (100 mg total) by mouth 2 (two) times daily. 12/01/21   Lomax, Amy, NP  carvedilol (COREG) 12.5 MG tablet Take 1 tablet (12.5 mg total) by mouth 2 (two) times daily with a meal. 04/16/22   Leonie Man, MD  CLARITIN 10 MG tablet Take 10 mg by mouth daily. 01/13/21   [provider]  clopidogrel (PLAVIX) 75 MG tablet TAKE 1 Tablet BY MOUTH ONCE DAILY 02/17/22   Leonie Man, MD  cyclobenzaprine (FLEXERIL) 10 MG  tablet Take 10 mg by mouth 3 (three) times daily as needed for muscle spasms.     [provider]  doxepin (SINEQUAN) 50 MG capsule Take 50 mg by mouth in the morning, at noon, and at bedtime.    [provider]  empagliflozin (JARDIANCE) 10 MG TABS tablet Take 1 tablet (10 mg total) by mouth daily before breakfast. KEEP OV. 09/29/21   Leonie Man, MD  Evolocumab (REPATHA SURECLICK) 893 MG/ML SOAJ Inject 140 mg into the skin every 14 (fourteen) days. KEEP OV. 09/29/21   Leonie Man, MD  gabapentin (NEURONTIN) 300 MG capsule Take 3 capsules (900 mg total) by mouth 3 (three) times daily. 08/31/21   Penumalli, Earlean Polka, MD  glipiZIDE (GLUCOTROL) 5 MG tablet Take 5 mg by mouth 2 (two) times daily. 11/18/21   [provider]  ibuprofen (ADVIL) 800 MG tablet Take 800 mg 3 times daily for 2 days, then decrease to 800 mg two times daily for 2 days, then decrease to 800 mg daily for 2 days, then stop. 04/16/22   Leonie Man, MD  Insulin Glargine Solostar (LANTUS) 100 UNIT/ML Solostar Pen Inject 35 Units into the skin daily. 09/25/21   [provider]  insulin lispro (HUMALOG) 100 UNIT/ML KwikPen Inject 10 Units into the skin in the morning, at noon, and  at bedtime. 09/25/21   [provider]  ipratropium (ATROVENT HFA) 17 MCG/ACT inhaler Inhale 2 puffs into the lungs every 6 (six) hours.    [provider]  isosorbide mononitrate (IMDUR) 60 MG 24 hr tablet TABLET 1 AND 1/2 tablets BY MOUTH EVERY MORNING AND 1/2 tablet AT BEDTIME 11/04/21   Leonie Man, MD  lidocaine (LIDODERM) 5 % Place 1 patch onto the skin daily as needed. Apply patch to area most significant pain once per day.  Remove and discard patch within 12 hours of application. 03/23/22   Petrucelli, Samantha R, PA-C  LINZESS 145 MCG CAPS capsule Take 145 mcg by mouth daily. 11/23/21   [provider]  nitroGLYCERIN (NITROSTAT) 0.4 MG SL tablet DISSOLVE 1 TABLET UNDER THE TONGUE  EVERY 5 MINUTES AS NEEDED FOR CHEST PAIN. DO NOT EXCEED A TOTAL OF 3 DOSES IN 15 MINUTES. CALL 911 IF NO RELIEF. 10/08/21   Leonie Man, MD  pantoprazole (PROTONIX) 40 MG tablet Take 1 tablet (40 mg total) by mouth 2 (two) times daily. KEEP OV. 09/29/21   Leonie Man, MD  polyethylene glycol powder [facility]) powder Take 17 g by mouth daily as needed for moderate constipation.  04/05/18   [provider]  QUEtiapine (SEROQUEL) 400 MG tablet Take 400 mg by mouth at bedtime. 04/05/18   [provider]  SEROQUEL 200 MG tablet Take 200 mg by mouth daily. In the morning 07/22/21   [provider]      Allergies    Bee venom, Contrast media [iodinated contrast media], and Tomato    Review of Systems   Review of Systems  Physical Exam Updated Vital Signs BP (!) 166/90   Pulse 100   Temp (!) 101.3 F (38.5 C) (Oral)   Resp 16   LMP 03/17/2022   SpO2 100%   Physical Exam Vitals and nursing note reviewed.  Constitutional:      General: She is not in acute distress.    Appearance: She is well-developed.  HENT:     Head: Normocephalic and atraumatic.     Right Ear: External ear normal.     Left Ear: External ear normal.     Nose: Nose normal.  Eyes:     Conjunctiva/sclera: Conjunctivae normal.  Cardiovascular:     Rate and Rhythm: Regular rhythm. Tachycardia present.     Heart sounds: No murmur heard. Pulmonary:     Effort: No respiratory distress.     Breath sounds: No wheezing, rhonchi or rales.     Comments: Frequent coughing during exam Abdominal:     Palpations: Abdomen is soft.     Tenderness: There is no abdominal tenderness. There is no guarding or rebound.  Musculoskeletal:     Cervical back: Normal range of motion and neck supple.     Right lower leg: No edema.     Left lower leg: No edema.  Skin:    General: Skin is warm and dry.     Findings: No rash.  Neurological:     General: No focal deficit present.     Mental Status:  She is alert. Mental status is at baseline.     Motor: No weakness.  Psychiatric:        Mood and Affect: Mood normal.     ED Results / Procedures / Treatments   Labs (all labs ordered are listed, but only abnormal results are displayed) Labs Reviewed  SARS CORONAVIRUS 2 BY RT PCR -  Abnormal; Notable for the following components:      Result Value   SARS Coronavirus 2 by RT PCR POSITIVE (*)    All other components within normal limits  COMPREHENSIVE METABOLIC PANEL - Abnormal; Notable for the following components:   Potassium 3.2 (*)    CO2 18 (*)    Glucose, Bld 222 (*)    All other components within normal limits  CBC WITH DIFFERENTIAL/PLATELET - Abnormal; Notable for the following components:   Hemoglobin 10.9 (*)    MCHC 29.9 (*)    RDW 16.3 (*)    All other components within normal limits  URINALYSIS, ROUTINE W REFLEX MICROSCOPIC - Abnormal; Notable for the following components:   APPearance HAZY (*)    Glucose, UA >=500 (*)    Hgb urine dipstick MODERATE (*)    Ketones, ur 20 (*)    Protein, ur 100 (*)    Leukocytes,Ua TRACE (*)    Bacteria, UA RARE (*)    All other components within normal limits  CULTURE, BLOOD (ROUTINE X 2)  LACTIC ACID, PLASMA  PROTIME-INR  APTT  I-STAT BETA HCG BLOOD, ED ([facility], [facility], [facility] ONLY)    ED ECG REPORT   Date: 04/21/2022  Rate: 115  Rhythm: sinus tachycardia  QRS Axis: left  Intervals: normal  ST/T Wave abnormalities: nonspecific T wave changes  Conduction Disutrbances:none  Narrative Interpretation:   Old EKG Reviewed: unchanged except faster today  I have personally reviewed the EKG tracing and agree with the computerized printout as noted.   Radiology DG Chest 2 View  Result Date: 04/21/2022 CLINICAL DATA:  Chills and cough EXAM: CHEST - 2 VIEW COMPARISON:  None Available. FINDINGS: The heart size and mediastinal contours are within normal limits. Both lungs are clear. The visualized skeletal structures are unremarkable.  Bibasilar atelectasis. IMPRESSION: No active cardiopulmonary disease. Electronically Signed   By: Marin Roberts M.D.   On: 04/21/2022 14:25    Procedures Procedures    Medications Ordered in ED Medications  acetaminophen (TYLENOL) tablet 650 mg (650 mg Oral Given 04/21/22 1551)  sodium chloride 0.9 % bolus 1,000 mL (1,000 mLs Intravenous New Bag/Given 04/21/22 1645)  potassium chloride (KLOR-CON) packet 40 mEq (40 mEq Oral Given 04/21/22 1643)    ED Course/ Medical Decision Making/ A&P    Patient seen and examined. History obtained directly from patient. Work-up including labs, imaging, EKG ordered in triage, if performed, were reviewed.    Labs/EKG: Independently reviewed and interpreted.  This included: Sepsis labs including CBC, CMP, PT/INR, APTT, pregnancy, lactate, blood culture, COVID, UA.  Imaging: Independently visualized and interpreted.  This included: Chest x-ray, agree negative for acute infiltrate  Medications/Fluids: Ordered: Tylenol  Most recent vital signs reviewed and are as follows: BP (!) 166/90   Pulse 100   Temp (!) 101.3 F (38.5 C) (Oral)   Resp 16   LMP 03/17/2022   SpO2 100%   Initial impression: Respiratory illness with cough    Reassessment performed. Patient appears comfortable.  Vitals are reassuring, improving heart rate after Tylenol.  Oxygen saturation 100% on room air.  No respiratory distress.  Labs personally reviewed and interpreted including: COVID-positive; CBC with white blood cell count 9000, hemoglobin 10.9 mild anemia chronic; CMP with potassium 3.2 repletion ordered, glucose 222 with bicarb 18 and normal anion gap, do not suspect DKA; PT/INR and APTT normal; lactate 1.9; negative pregnancy; UA with glucose, negative nitrite, some white blood cells however patient is asymptomatic from a urinary standpoint.  Reviewed pertinent lab work and imaging with patient at bedside. Questions answered.   Most current vital signs reviewed and are  as follows: BP (!) 150/85   Pulse 95   Temp 99.3 F (37.4 C)   Resp (!) 25   LMP 03/17/2022   SpO2 100%   Plan: Patient is going to eat and drink, finished receiving bag of IV fluids.  Discussed Paxlovid with patient at bedside.  Will initiate therapy given comorbidities.   7:05 PM Reassessment performed. Patient appears stable.  Ready for discharge.  Most current vital signs reviewed and are as follows: BP (!) 155/85   Pulse 95   Temp 99.3 F (37.4 C)   Resp (!) 30   LMP 03/17/2022   SpO2 100%   Plan: Discharge to home.   Prescriptions written for: Paxlovid, Zofran  Detailed discussion had with with patient regarding COVID-19 precautions and written instructions given as well.  We discussed need to isolate themselves for 5 days from onset of symptoms and have 24 hours of improvement prior to breaking isolation.  We discussed that when breaking isolation, mask wearing for 5 additional days is required.  We discussed signs symptoms to return which include worsening shortness of breath, trouble breathing, or increased work of breathing.  Also return with persistent vomiting, confusion, passing out, or if they have any other concerns. Counseled on the need for rest and good hydration. Discussed that high-risk contacts should be aware of positive result and they need to quarantine and be tested if they develop any symptoms. Patient verbalizes understanding.   MEGHAN HELGREN was evaluated in Emergency Department on 04/21/2022 for the symptoms described in the history of present illness. She was evaluated in the context of the global COVID-19 pandemic, which necessitated consideration that the patient might be at risk for infection with the SARS-CoV-2 virus that causes COVID-19. Institutional protocols and algorithms that pertain to the evaluation of patients at risk for COVID-19 are in a state of rapid change based on information released by regulatory bodies including the CDC and federal  and state organizations. These policies and algorithms were followed during the patient's care in the ED.                             Medical Decision Making Amount and/or Complexity of Data Reviewed Labs: ordered. Radiology: ordered. ECG/medicine tests: ordered.  Risk OTC drugs. Prescription drug management.  Patient with fever and cough.  History of pneumonia.  Chest x-ray is clear today.  COVID test positive.  No concern for sepsis.  History of diabetes without signs of DKA at this time.  Patient was given IV fluid bolus with improvement.  Tachycardia on arrival, improved with control of temperature and IV fluids, no respiratory distress, maintained 100% O2 saturation on room air.  She has comorbidities and is a candidate for Paxlovid.  Normal kidney function.  Will initiate therapy.  She looks comfortable.  The patient's vital signs, pertinent lab work and imaging were reviewed and interpreted as discussed in the ED course. Hospitalization was considered for further testing, treatments, or serial exams/observation. However as patient is well-appearing, has a stable exam, and reassuring studies today, I do not feel that they warrant admission at this time. This plan was discussed with the patient who verbalizes agreement and comfort with this plan and seems reliable and able to return to the Emergency Department with worsening or changing symptoms.  Final Clinical Impression(s) / ED Diagnoses Final diagnoses:  COVID-19  Acute cough    Rx / DC Orders ED Discharge Orders          Ordered    nirmatrelvir/ritonavir EUA (PAXLOVID) 20 x 150 MG & 10 x 100MG  TABS  2 times daily        04/21/22 1843    ondansetron (ZOFRAN-ODT) 4 MG disintegrating tablet  Every 8 hours PRN        04/21/22 1843              Carlisle Cater, PA-C 04/21/22 1907    Carmin Muskrat, MD 04/22/22 [phone]

## 2022-04-21 NOTE — ED Triage Notes (Signed)
Pt reports body aches, chills, coughs, etc. Pt reports hx of pneumonia and states she feels the same.

## 2022-04-21 NOTE — Discharge Instructions (Signed)
Please read and follow all provided instructions.  Your diagnoses today include:  1. COVID-19   2. Acute cough     Tests performed today include: Vital signs. See below for your results today.  COVID test -was positive Chest x-ray: Did not show signs of pneumonia White cell counts and electrolytes: Mildly low potassium, and chronic anemia, otherwise no problems  Medications prescribed:  Paxlovid: Antiviral medication for COVID  Zofran (ondansetron) - for nausea and vomiting   Take any prescribed medications only as directed. Treatment for your infection is aimed at treating the symptoms. There are no medications, such as antibiotics, that will cure your infection.   Home care instructions:  Follow any educational materials contained in this packet.   Your illness is contagious and can be spread to others, especially during the first 3 or 4 days. It cannot be cured by antibiotics or other medicines. Take basic precautions such as washing your hands often, covering your mouth when you cough or sneeze, and avoiding public places where you could spread your illness to others.   Please continue drinking plenty of fluids.  Use over-the-counter medicines as needed as directed on packaging for symptom relief.  You may also use ibuprofen or tylenol as directed on packaging for pain or fever.  Do not take multiple medicines containing Tylenol or acetaminophen to avoid taking too much of this medication.  If you are positive for Covid-19, you should isolate yourself and not be exposed to other people for 5 days after your symptoms began. If you are not feeling better at day 5, you need to isolate yourself for a total of 10 days. If you are feeling better by day 5, you should wear a mask properly, over your nose and mouth, at all times while around other people until 10 days after your symptoms started.   Follow-up instructions: Please follow-up with your primary care provider as needed for  further evaluation of your symptoms if you are not feeling better.   Return instructions:  Please return to the Emergency Department if you experience worsening symptoms.  Return to the emergency department if you have worsening shortness of breath breathing or increased work of breathing, persistent vomiting RETURN IMMEDIATELY IF you develop shortness of breath, confusion or altered mental status, a new rash, become dizzy, faint, or poorly responsive, or are unable to be cared for at home. Please return if you have persistent vomiting and cannot keep down fluids or develop a fever that is not controlled by tylenol or motrin.   Please return if you have any other emergent concerns.  Additional Information:  Your vital signs today were: BP (!) 150/85   Pulse 95   Temp 99.3 F (37.4 C)   Resp (!) 25   LMP 03/17/2022   SpO2 100%  If your blood pressure (BP) was elevated above 135/85 this visit, please have this repeated by your doctor within one month. --------------

## 2022-04-21 NOTE — ED Provider Triage Note (Signed)
Emergency Medicine Provider Triage Evaluation Note  Maria Harper , a 51 y.o. female  was evaluated in triage.  Pt complains of fever and cough starting about 3 days ago, worsening.  She has a history of stroke with residual right-sided weakness.  She states that she has had a couple of previous episodes of pneumonia in her life and that this feels similar.  She has associated shortness of breath.  States that heart rate was elevated this morning when she went to see her neurologist.  This was a routine apartment in regards to her previous stroke.  She has had chest pain.   Review of Systems  Positive: Fever, cough, shortness of breath, chest pain Negative: Vomiting  Physical Exam  BP (!) 160/84 (BP Location: Right Arm)   Pulse 81   Temp (!) 101.3 F (38.5 C) (Oral)   Resp 18   LMP 03/17/2022   SpO2 96%  Gen:   Awake, no distress   Resp:  Normal effort  MSK:   Moves extremities without difficulty  Other:  Tachycardic on exam, recurrent coughing during exam  Medical Decision Making  Medically screening exam initiated at 1:44 PM.  Appropriate orders placed.  [place] was informed that the remainder of the evaluation will be completed by another provider, this initial triage assessment does not replace that evaluation, and the importance of remaining in the ED until their evaluation is complete.     Carlisle Cater, PA-C 04/21/22 1346

## 2022-04-22 ENCOUNTER — Other Ambulatory Visit: Payer: Self-pay | Admitting: Family Medicine

## 2022-04-22 LAB — COMPREHENSIVE METABOLIC PANEL
ALT: 10 IU/L (ref 0–32)
AST: 14 IU/L (ref 0–40)
Albumin/Globulin Ratio: 1.4 (ref 1.2–2.2)
Albumin: 4.7 g/dL (ref 3.9–4.9)
Alkaline Phosphatase: 97 IU/L (ref 44–121)
BUN/Creatinine Ratio: 11 (ref 9–23)
BUN: 11 mg/dL (ref 6–24)
Bilirubin Total: <0.2 mg/dL (ref 0.0–1.2)
CO2: 18 mmol/L — ABNORMAL LOW (ref 20–29)
Calcium: 9.3 mg/dL (ref 8.7–10.2)
Chloride: 96 mmol/L (ref 96–106)
Creatinine, Ser: 1.02 mg/dL — ABNORMAL HIGH (ref 0.57–1.00)
Globulin, Total: 3.3 g/dL (ref 1.5–4.5)
Glucose: 235 mg/dL — ABNORMAL HIGH (ref 70–99)
Potassium: 3.7 mmol/L (ref 3.5–5.2)
Sodium: 136 mmol/L (ref 134–144)
Total Protein: 8 g/dL (ref 6.0–8.5)
eGFR: 67 mL/min/{1.73_m2} (ref >59)

## 2022-04-22 LAB — CBC WITH DIFFERENTIAL/PLATELET
Basophils Absolute: 0 10*3/uL (ref 0.0–0.2)
Basos: 0 %
EOS (ABSOLUTE): 0 10*3/uL (ref 0.0–0.4)
Eos: 0 %
Hematocrit: 34.5 % (ref 34.0–46.6)
Hemoglobin: 10.9 g/dL — ABNORMAL LOW (ref 11.1–15.9)
Immature Grans (Abs): 0.1 10*3/uL (ref 0.0–0.1)
Immature Granulocytes: 1 %
Lymphocytes Absolute: 0.6 10*3/uL — ABNORMAL LOW (ref 0.7–3.1)
Lymphs: 6 %
MCH: 27 pg (ref 26.6–33.0)
MCHC: 31.6 g/dL (ref 31.5–35.7)
MCV: 85 fL (ref 79–97)
Monocytes Absolute: 0.8 10*3/uL (ref 0.1–0.9)
Monocytes: 7 %
Neutrophils Absolute: 8.9 10*3/uL — ABNORMAL HIGH (ref 1.4–7.0)
Neutrophils: 86 %
Platelets: 226 10*3/uL (ref 150–450)
RBC: 4.04 x10E6/uL (ref 3.77–5.28)
RDW: 15.5 % — ABNORMAL HIGH (ref 11.7–15.4)
WBC: 10.3 10*3/uL (ref 3.4–10.8)

## 2022-04-22 LAB — CARBAMAZEPINE LEVEL, TOTAL: Carbamazepine (Tegretol), S: 3.4 ug/mL — ABNORMAL LOW (ref 4.0–12.0)

## 2022-04-22 MED ORDER — CARBAMAZEPINE ER 100 MG PO CP12: 200.0000 mg | ORAL_CAPSULE | Freq: Two times a day (BID) | ORAL | 6 refills | Status: DC

## 2022-04-27 ENCOUNTER — Telehealth: Payer: Self-pay | Admitting: *Deleted

## 2022-04-27 LAB — CULTURE, BLOOD (ROUTINE X 2): Culture: NO GROWTH

## 2022-04-27 MED ORDER — CARBAMAZEPINE 200 MG PO TABS: 200.0000 mg | ORAL_TABLET | Freq: Two times a day (BID) | ORAL | 11 refills | Status: DC

## 2022-04-27 NOTE — Telephone Encounter (Signed)
-----   Message from Debbora Presto, NP sent at 04/22/2022 11:45 AM EDT ----- Please let her know that her labs look ok with the exception of her kidney functioning. Please remind her to follow up with PCP. I would encourage her not to take Advil (ibuprofen) due to elevated creatinine and she is on Plavix. Taking Plavix with advil will increase risk of bleeding. Her sodium looks ok. I told her I was going to increase carbamazepine to 150mg  TID, however, we can not split the tablets so I am calling in a 200mg  tablet that she will take twice daily. She can take two 100mg  tablets twice daily if she has any left at home. Remind her to listen out for a call from pain management.

## 2022-04-27 NOTE — Telephone Encounter (Signed)
Called and spoke w/ pt about lab results per AL,NP note. She verbalized understanding. Rx carbamazepine 200mg  po BID called into [facility] per pt request. She will f/u w/ PCP about decreased kidney function.

## 2022-05-11 ENCOUNTER — Telehealth: Payer: Self-pay | Admitting: Cardiology

## 2022-05-11 DIAGNOSIS — E1169 Type 2 diabetes mellitus with other specified complication: Secondary | ICD-10-CM

## 2022-05-11 DIAGNOSIS — I25119 Atherosclerotic heart disease of native coronary artery with unspecified angina pectoris: Secondary | ICD-10-CM

## 2022-05-11 NOTE — Telephone Encounter (Signed)
Follow Up:    Patient's husband will call back with the  name pf the pharmacy. He says it does not come from [facility].

## 2022-05-11 NOTE — Telephone Encounter (Signed)
*  STAT* If patient is at the pharmacy, call can be transferred to refill team.   1. Which medications need to be refilled? (please list name of each medication and dose if known)  new prescription for Repatha  2. Which pharmacy/location (including street and city if local pharmacy) is medication to be sent to?   3. Do they need a 30 day or 90 day supply?

## 2022-05-11 NOTE — Telephone Encounter (Signed)
Follow Up:     Husband called back and said it is [facility] on [address], [place]

## 2022-05-12 MED ORDER — REPATHA SURECLICK 140 MG/ML ~~LOC~~ SOAJ: 140.0000 mg | SUBCUTANEOUS | 3 refills | Status: DC

## 2022-05-13 ENCOUNTER — Telehealth: Payer: Self-pay | Admitting: Cardiology

## 2022-05-13 DIAGNOSIS — I213 ST elevation (STEMI) myocardial infarction of unspecified site: Secondary | ICD-10-CM | POA: Insufficient documentation

## 2022-05-13 NOTE — Telephone Encounter (Signed)
Pt c/o medication issue:  1. Name of Medication:   Evolocumab (REPATHA SURECLICK) 407 MG/ML SOAJ    2. How are you currently taking this medication (dosage and times per day)? Inject 140 mg into the skin every 14 (fourteen) days.  3. Are you having a reaction (difficulty breathing--STAT)?   4. What is your medication issue? Pharmacy calling stating pt does not need a refill, they need prior authorization renewal

## 2022-05-13 NOTE — Telephone Encounter (Signed)
PA for Repatha sent.  Key: [id number]

## 2022-05-13 NOTE — Telephone Encounter (Signed)
Routed to pharmacy team 

## 2022-05-14 ENCOUNTER — Encounter: Payer: Self-pay | Admitting: Physical Medicine & Rehabilitation

## 2022-05-14 ENCOUNTER — Telehealth: Payer: Self-pay | Admitting: Cardiology

## 2022-05-14 NOTE — Telephone Encounter (Signed)
Pharmacy calling wanting to know how long will PA take for repatha

## 2022-05-15 NOTE — Telephone Encounter (Signed)
PA sent to [facility] 05/15/22  Key [id number]

## 2022-05-19 ENCOUNTER — Other Ambulatory Visit: Payer: Self-pay | Admitting: Pharmacist Clinician (PhC)/ Clinical Pharmacy Specialist

## 2022-05-19 DIAGNOSIS — E1169 Type 2 diabetes mellitus with other specified complication: Secondary | ICD-10-CM

## 2022-05-19 MED ORDER — REPATHA SURECLICK 140 MG/ML ~~LOC~~ SOAJ: 140.0000 mg | SUBCUTANEOUS | 3 refills | Status: DC

## 2022-05-19 NOTE — Telephone Encounter (Signed)
Needed updated prescription sent to [facility] for [employer]

## 2022-05-20 NOTE — Telephone Encounter (Signed)
Spoke with husband, she has missed several doses of Repatha, but is still getting from [place].  Explained insurance denial because labs were drawn when off med, so looks as though ineffective/non-compliant to insurance.  She will get back on and be sure to have labs drawn in 8-10 weeks.  Will re-do prior authorization after Jan 1, as she will need to renew with [employer] at that time.  Husband voiced understanding

## 2022-05-21 ENCOUNTER — Telehealth: Payer: Self-pay | Admitting: Cardiology

## 2022-05-21 NOTE — Telephone Encounter (Signed)
Husband called stating the manufacturer called the patient stating she has no more refills left and the patient will need her prescription for Evolocumab (REPATHA SURECLICK) 875 MG/ML SOAJ faxed to [facility].  Patient would like a call back to confirm.

## 2022-05-25 MED ORDER — REPATHA SURECLICK 140 MG/ML ~~LOC~~ SOAJ: 140.0000 mg | SUBCUTANEOUS | 3 refills | Status: DC

## 2022-05-25 NOTE — Telephone Encounter (Signed)
Prescription for Repatha 140 mg for 3 months with 3 refills sent to the [facility].

## 2022-05-31 ENCOUNTER — Telehealth: Payer: Self-pay | Admitting: Cardiology

## 2022-05-31 NOTE — Telephone Encounter (Signed)
*  STAT* If patient is at the pharmacy, call can be transferred to refill team.   1. Which medications need to be refilled? (please list name of each medication and dose if known) Evolocumab (REPATHA SURECLICK) 014 MG/ML SOAJ  2. Which pharmacy/location (including street and city if local pharmacy) is medication to be sent to? [employer] [facility] [employer] ( Manufacture)  3. Do they need a 30 day or 90 day supply? 30 day   Medication refill was sent to the wrong pharmacy.

## 2022-06-02 MED ORDER — REPATHA SURECLICK 140 MG/ML ~~LOC~~ SOAJ: 140.0000 mg | SUBCUTANEOUS | 3 refills | Status: DC

## 2022-06-11 ENCOUNTER — Other Ambulatory Visit: Payer: Medicaid Other

## 2022-06-14 ENCOUNTER — Other Ambulatory Visit: Payer: Self-pay | Admitting: Cardiology

## 2022-06-21 ENCOUNTER — Encounter: Payer: Self-pay | Admitting: *Deleted

## 2022-06-21 NOTE — Congregational Nurse Program (Signed)
  Dept: [phone]   Congregational Nurse Program Note  Date of Encounter: 06/21/2022  Past Medical History: Past Medical History:  Diagnosis Date   Acid reflux    Cocaine abuse (HCC)    Coronary artery disease, occlusive 2017   2017 or 2018-PCI to LAD (mid-distal); -> anterior STEMI April 2021 -> LAD stent patent, however distal LAD at D3 occluded, only able to reduce to 70% stenosis.  Small caliber RPDA with 80% stenosis.   Diabetes (HCC)    Heart attack (HCC) 04/2016   High cholesterol    Hypertension    Ischemic cardiomyopathy 11/2019   Neuropathic pain    Stroke (HCC) 03/2020   Tobacco abuse     Encounter Details:  CNP Questionnaire - 06/21/22 1508       Questionnaire   Ask client: Do you give verbal consent for me to treat you today? Yes    Student Assistance N/A    Location Patient Served  [facility]    Visit Setting with Client Organization;Phone/Text/Email    Patient Status Unhoused    Insurance Medicaid    Insurance/Financial Assistance Referral N/A    Medication Have Medication Insecurities;Made Appointment for Client    Medical Provider Yes    Screening Referrals Made N/A    Medical Referrals Made Non-[facility]    Medical Appointment Made Non-[facility]    Recently w/o PCP, now 1st time PCP visit completed due to CNs referral or appointment made N/A    Food N/A    Transportation Need transportation assistance;Provided transportation assistance    Housing/Utilities No permanent housing;Referred to homeless shelter, day center    Interpersonal Safety N/A    Interventions Advocate/Support;Navigate Healthcare System    Abnormal to Normal Screening Since Last CN Visit N/A    Screenings CN Performed N/A    Sent Client to Lab for: N/A    Did client attend any of the following based off CNs referral or appointments made? N/A    ED Visit Averted N/A    Life-Saving Intervention Made N/A           Client came to nurse's office requesting  help getting back on her psychiatric medications. Referred client to [facility] and chart was started on the phone. Client reports she will go to [facility] tomorrow between 8-2 to complete the rest of intake. She denies si and hi. Client is currently homeless. Completed referral for pallet temporary shelter. Nihal Marzella W RN CN

## 2022-06-22 ENCOUNTER — Encounter: Payer: Medicaid Other | Attending: Physical Medicine & Rehabilitation | Admitting: Physical Medicine & Rehabilitation

## 2022-06-22 ENCOUNTER — Encounter: Payer: Self-pay | Admitting: Physical Medicine & Rehabilitation

## 2022-06-22 VITALS — Ht 62.0 in | Wt 152.0 lb

## 2022-06-22 DIAGNOSIS — G890 Central pain syndrome: Secondary | ICD-10-CM | POA: Diagnosis not present

## 2022-06-22 MED ORDER — GABAPENTIN 300 MG PO CAPS: 300.0000 mg | ORAL_CAPSULE | Freq: Three times a day (TID) | ORAL | 12 refills | Status: DC

## 2022-06-22 MED ORDER — PREGABALIN 75 MG PO CAPS: 75.0000 mg | ORAL_CAPSULE | Freq: Three times a day (TID) | ORAL | 1 refills | Status: DC

## 2022-06-22 NOTE — Progress Notes (Signed)
Subjective:    Patient ID: Maria Harper, female    DOB: 03-Jun-1971, 51 y.o.   MRN: [id number]  HPI Chief complaint: Right-sided pain  51 year old female with history of diabetes, coronary artery disease with MI, ischemic cardiomyopathy, hyperlipidemia, tobacco abuse, history of cocaine abuse and mood disorder who was referred by primary care for the evaluation of poststroke pain.  The patient suffered a small left thalamic infarct on 03/22/2020.  MRI also showed chronic bilateral basal ganglia small vessel infarcts.  C-spine MRI showed mild cervical degenerative disc but no significant stenosis.  The patient indicates that she has reduced sensation on the right leg compared to the left leg, she has a constant burning stabbing tingling pain on the right side which is severe.  She is independent with all her self-care and ADLs.  She has completed outpatient therapy.  She is not very physically active stands a couple hours a day walks a couple hours a day but otherwise is in bed or sitting.  She has been seen by neurology for this problem.  She has been prescribed high-dose gabapentin currently taking 900 mg 3 times daily which she states does not completely get rid of the pain. Review of records so that she was prescribed Lyrica for 2 months at a dose of 50 mg twice daily, the patient is also on doxepin 50 mg 3 times daily as well as Tegretol 200 mg twice daily The Tegretol was added in September and the patient has not noted any improvements in her pain since that time.  Allergies have been reviewed. Pain Inventory Average Pain 10 Pain Right Now 9 My pain is constant, sharp, burning, tingling, stabbing, dull, aching  LOCATION OF PAIN  all over the body but mainly the total right side of the body  BOWEL Number of stools per week: 2 Oral laxative use Yes  Type of laxative Linzess Enema or suppository use No  History of colostomy No  Incontinent No    BLADDER Normal    Mobility ability to climb steps?  no do you drive?  no Do you have any goals in this area?  yes  Function disabled: date disabled applied I need assistance with the following:  dressing, bathing, toileting, meal prep, household duties, and shopping Do you have any goals in this area?  yes  Neuro/Psych bowel control problems weakness numbness trouble walking depression anxiety  Prior Studies Any changes since last visit?  no  Physicians involved in your care Any changes since last visit?  no   Family History  Problem Relation Age of Onset   Hypertension Mother    Diabetes Mother    Hypertension Father    Diabetes Father    Diabetes Maternal Grandmother    Diabetes Maternal Grandfather    Social History   Socioeconomic History   Marital status: Married    Spouse name: Not on file   Number of children: 0   Years of education: 6 th   Highest education level: Not on file  Occupational History   Not on file  Tobacco Use   Smoking status: Every Day    Packs/day: 1.50    Years: 20.00    Total pack years: 30.00    Types: Cigarettes   Smokeless tobacco: Never  Vaping Use   Vaping Use: Never used  Substance and Sexual Activity   Alcohol use: Not Currently   Drug use: Not Currently    Types: "Crack" cocaine    Comment: last  used yesterday. States " I do it everyday"    Sexual activity: Not on file  Other Topics Concern   Not on file  Social History Narrative   Right handed    Caffeine - no more than 2 cups    Social Determinants of Health   Financial Resource Strain: Not on file  Food Insecurity: Not on file  Transportation Needs: Not on file  Physical Activity: Not on file  Stress: Not on file  Social Connections: Not on file   Past Surgical History:  Procedure Laterality Date   CORONARY/GRAFT ACUTE MI REVASCULARIZATION N/A 11/23/2019   Procedure: CORONARY/GRAFT ACUTE MI REVASCULARIZATION;  Surgeon: [name], MD;   Location: [facility] INVASIVE CV LAB;  Service: Cardiovascular; unsuccessful PTCA of distal LAD 100%.  O able to restore only minimal flow with 70% stenosis   LEFT HEART CATH AND CORONARY ANGIOGRAPHY N/A 11/23/2019   Procedure: LEFT HEART CATH AND CORONARY ANGIOGRAPHY;  Surgeon: [name], MD;  Location: [facility] INVASIVE CV LAB;  Service: Cardiovascular; ANTERIOR STEMI: Widely patent mid to distal LAD stent, distal LAD is 100% stenosed associated with the third diagonal.  (Post PTCA - 70% residual but 100% D3).  RPDA tapers to a small vessel -80% stenosis.  Severely elevated LVEDP   NM MYOVIEW LTD  08/27/2020   (recurrent chest pain): Poor study due to gut attenuation.  EF ~50-55%.  No EKG changes.  MLSTLY FIXED LARGE/SEVRE perfusion defect in the basal-apical inferior, mid inferolateral & mid-apical anterior / apical walls: Findings are c/w known anatomy (apical LAD occlusion & PDA 80%, with gut attenuation enhancing inferior involvement -> but Echo with normal EF & no RWA - contradicts this finding.   TRANSTHORACIC ECHOCARDIOGRAM  11/24/2019   -post anterior STEMI: EF mildly reduced 45 to 50%.  GR 1 DD.  Elevated LAP.  No obvious heart WMA.  Moderate-severe AI with mild-moderate Aortic Sclerosis but no Stenosis.   TRANSTHORACIC ECHOCARDIOGRAM  03/23/2020   (stroke evaluation): EF improved to 55 to 60%.  Significant LVH-notable apical.  Consider cardiac MRI.  Moderate concentric LVH.  GR 1 DD.  Elevated LAP.  Mild to moderate Aortic Sclerosis but no Stenosis.  Moderate AI.    Past Medical History:  Diagnosis Date   Acid reflux    Cocaine abuse (HCC)    Coronary artery disease, occlusive 2017   2017 or 2018-PCI to LAD (mid-distal); -> anterior STEMI April 2021 -> LAD stent patent, however distal LAD at D3 occluded, only able to reduce to 70% stenosis.  Small caliber RPDA with 80% stenosis.   Diabetes (HCC)    Heart attack (HCC) 04/2016   High cholesterol    Hypertension    Ischemic cardiomyopathy  11/2019   Neuropathic pain    Stroke (HCC) 03/2020   Tobacco abuse    Ht 5\' 2"  (1.575 m)   Wt 152 lb (68.9 kg)   BMI 27.80 kg/m   Opioid Risk Score:   Fall Risk Score:  `1  Depression screen PHQ 2/9      No data to display          Review of Systems  HENT:  Positive for drooling.   Gastrointestinal:  Positive for constipation.  Musculoskeletal:  Positive for gait problem.  Neurological:  Positive for weakness and numbness.  Psychiatric/Behavioral:         Depression, anxiety  All other systems reviewed and are negative.      Objective:   Physical Exam Vitals and  nursing note reviewed.  Constitutional:      General: She is not in acute distress.    Appearance: She is normal weight. She is not ill-appearing.  HENT:     Head: Normocephalic and atraumatic.  Eyes:     General: No visual field deficit.    Extraocular Movements: Extraocular movements intact.     Conjunctiva/sclera: Conjunctivae normal.     Pupils: Pupils are equal, round, and reactive to light.  Musculoskeletal:     Comments: No pain with upper extremity or lower extremity range of motion at the shoulders elbows wrists fingers hips knees or ankles. No hypersensitivity to touch in the right upper limb or right lower limb or in the left upper limb and left lower limb   Skin:    General: Skin is warm and dry.  Neurological:     Mental Status: She is alert and oriented to person, place, and time.     Cranial Nerves: No dysarthria or facial asymmetry.     Sensory: Sensory deficit present.     Motor: No abnormal muscle tone or seizure activity.     Coordination: Coordination abnormal. Impaired rapid alternating movements.     Gait: Gait abnormal.     Comments: Ambulates without assistive device the patient has mild circumduction due to mild foot drop, poor heel strike but no toe drag.  She is able to toe walk but unable to heel walk. Right upper limb is 4/5 strength in the deltoid by stress of grip  right lower extremity 4/5 in hip flexor knee extensor ankle dorsiflexor 5/5 in the left upper limb and left lower limb Sensation intact to light touch in the right upper limb reduced to light touch in the right lower limb intact in the left upper and left lower Proprioception is intact right upper limb           Assessment & Plan:   1.  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  She has mild sensory deficits right upper limb and more severe deficits right lower limb however from a pain standpoint fairly equal right upper and right lower.  The patient does not have movement allodynia or hypersensitivity to touch. She has been trialed on gabapentin at adequate dose.  As discussed with the patient while her pain is not resolved with this medication it may be helping.  She may notice increase in pain as we wean this down.  The patient wishes to try another agent therefore will reduce gabapentin to 300 mg 3 times daily and start pregabalin 75 mg 3 times daily I will see her back in 6 weeks.  At that time I anticipate increasing the Lyrica and stopping the gabapentin.  She is to call if she has problems between now and then. She may discuss weaning or stopping carbamazepine with neurology since she does not feel any additional benefit from this.  2.  The patient does have some left lower extremity paresthesias she may have a diabetic neuropathy.  Consider EMG/NCV although this is not her primary issue.

## 2022-06-22 NOTE — Patient Instructions (Signed)
Reduce Gabapentin to 1 tablet 3 times a day  Start lyrica 75mg  3 times a dy

## 2022-07-13 ENCOUNTER — Ambulatory Visit
Admission: RE | Admit: 2022-07-13 | Discharge: 2022-07-13 | Disposition: A | Discharge status: Home / Self Care | Payer: Medicaid Other | Source: Ambulatory Visit | Attending: Family Medicine | Admitting: Family Medicine

## 2022-07-13 DIAGNOSIS — N6320 Unspecified lump in the left breast, unspecified quadrant: Secondary | ICD-10-CM

## 2022-07-19 ENCOUNTER — Ambulatory Visit: Payer: Medicaid Other | Attending: Physician Assistant | Admitting: Physician Assistant

## 2022-07-21 ENCOUNTER — Other Ambulatory Visit: Payer: Self-pay | Admitting: General Practice

## 2022-07-21 DIAGNOSIS — N6320 Unspecified lump in the left breast, unspecified quadrant: Secondary | ICD-10-CM

## 2022-07-21 NOTE — Progress Notes (Signed)
This encounter was created in error - please disregard.

## 2022-07-27 ENCOUNTER — Telehealth: Payer: Self-pay | Admitting: Physical Medicine & Rehabilitation

## 2022-07-27 NOTE — Telephone Encounter (Signed)
Pharmarcy called again to confirm if patient is to take Lyrica & Gabapentin.

## 2022-07-27 NOTE — Telephone Encounter (Signed)
Dee from [facility] called and LVM 12/22 requesting a call back want to confirm if patient is on Gabapentin or Lyrica , please call [phone]

## 2022-07-27 NOTE — Telephone Encounter (Signed)
Pharmacy informed.

## 2022-07-28 ENCOUNTER — Encounter: Payer: Self-pay | Admitting: Physician Assistant

## 2022-08-06 ENCOUNTER — Encounter: Payer: Medicaid Other | Attending: Physical Medicine & Rehabilitation | Admitting: Physical Medicine & Rehabilitation

## 2022-08-06 ENCOUNTER — Encounter: Payer: Self-pay | Admitting: Physical Medicine & Rehabilitation

## 2022-08-06 VITALS — BP 113/70 | HR 85 | Ht 62.0 in | Wt 150.0 lb

## 2022-08-06 DIAGNOSIS — G890 Central pain syndrome: Secondary | ICD-10-CM | POA: Diagnosis present

## 2022-08-06 MED ORDER — PREGABALIN 100 MG PO CAPS: 100.0000 mg | ORAL_CAPSULE | Freq: Three times a day (TID) | ORAL | 1 refills | Status: DC

## 2022-08-06 NOTE — Progress Notes (Signed)
Subjective:    Patient ID: Maria Harper, female    DOB: Oct 01, 1970, 52 y.o.   MRN: [id number]  HPI  52 year old female with history of left thalamic infarct in August 2021.  She has had resultant right hemisensory loss but also has had chronic burning pain as well.  She has been seen by neurology trialed on gabapentin which was not very effective for her pain.  She has just been recently started at last visit on pregabalin and has been down to 75 mg 3 times daily.  Patient states that her pain still occurs and has not noted much change thus far. The patient has had no falls or new injuries she continues with right upper extremity and lower extremity burning discomfort not as much over the face Patient remains independent with all self-care and mobility does not require assistive device for ambulation. The patient continues to smoke Pain Inventory Average Pain 10 Pain Right Now 10 My pain is constant, sharp, burning, dull, stabbing, tingling, and aching  In the last 24 hours, has pain interfered with the following? General activity 10 Relation with others 0 Enjoyment of life 0 What TIME of day is your pain at its worst? daytime, evening, night, and varies Sleep (in general) Good with medication  Pain is worse with: walking, bending, sitting, standing, and some activites Pain improves with:  nothing Relief from Meds: 0      Family History  Problem Relation Age of Onset   Hypertension Mother    Diabetes Mother    Hypertension Father    Diabetes Father    Diabetes Maternal Grandmother    Diabetes Maternal Grandfather    Social History   Socioeconomic History   Marital status: Married    Spouse name: Not on file   Number of children: 0   Years of education: 6 th   Highest education level: Not on file  Occupational History   Not on file  Tobacco Use   Smoking status: Every Day    Packs/day: 1.50    Years: 20.00    Total pack years: 30.00    Types: Cigarettes    Smokeless tobacco: Never  Vaping Use   Vaping Use: Never used  Substance and Sexual Activity   Alcohol use: Not Currently   Drug use: Not Currently    Types: "Crack" cocaine    Comment: last used yesterday. States " I do it everyday"    Sexual activity: Not on file  Other Topics Concern   Not on file  Social History Narrative   Right handed    Caffeine - no more than 2 cups    Social Determinants of Health   Financial Resource Strain: Not on file  Food Insecurity: Not on file  Transportation Needs: Not on file  Physical Activity: Not on file  Stress: Not on file  Social Connections: Not on file   Past Surgical History:  Procedure Laterality Date   CORONARY/GRAFT ACUTE MI REVASCULARIZATION N/A 11/23/2019   Procedure: CORONARY/GRAFT ACUTE MI REVASCULARIZATION;  Surgeon: Leonie Man, MD;  Location: [place] CV LAB;  Service: Cardiovascular; unsuccessful PTCA of distal LAD 100%.  O able to restore only minimal flow with 70% stenosis   LEFT HEART CATH AND CORONARY ANGIOGRAPHY N/A 11/23/2019   Procedure: LEFT HEART CATH AND CORONARY ANGIOGRAPHY;  Surgeon: Leonie Man, MD;  Location: [place] CV LAB;  Service: Cardiovascular; ANTERIOR STEMI: Widely patent mid to distal LAD stent, distal LAD is 100% stenosed  associated with the third diagonal.  (Post PTCA - 70% residual but 100% D3).  RPDA tapers to a small vessel -80% stenosis.  Severely elevated LVEDP   NM MYOVIEW LTD  08/27/2020   (recurrent chest pain): Poor study due to gut attenuation.  EF ~50-55%.  No EKG changes.  MLSTLY FIXED LARGE/SEVRE perfusion defect in the basal-apical inferior, mid inferolateral & mid-apical anterior / apical walls: Findings are c/w known anatomy (apical LAD occlusion & PDA 80%, with gut attenuation enhancing inferior involvement -> but Echo with normal EF & no RWA - contradicts this finding.   TRANSTHORACIC ECHOCARDIOGRAM  11/24/2019   -post anterior STEMI: EF mildly reduced 45 to 50%.  GR 1  DD.  Elevated LAP.  No obvious heart WMA.  Moderate-severe AI with mild-moderate Aortic Sclerosis but no Stenosis.   TRANSTHORACIC ECHOCARDIOGRAM  03/23/2020   (stroke evaluation): EF improved to 55 to 60%.  Significant LVH-notable apical.  Consider cardiac MRI.  Moderate concentric LVH.  GR 1 DD.  Elevated LAP.  Mild to moderate Aortic Sclerosis but no Stenosis.  Moderate AI.    Past Medical History:  Diagnosis Date   Acid reflux    Cocaine abuse ([place])    Coronary artery disease, occlusive 2017   2017 or 2018-PCI to LAD (mid-distal); -> anterior STEMI April 2021 -> LAD stent patent, however distal LAD at D3 occluded, only able to reduce to 70% stenosis.  Small caliber RPDA with 80% stenosis.   Diabetes ([place])    Heart attack ([place]) 04/2016   High cholesterol    Hypertension    Ischemic cardiomyopathy 11/2019   Neuropathic pain    Stroke ([place]) 03/2020   Tobacco abuse    BP 113/70   Pulse 85   Ht 5\' 2"  (1.575 m)   Wt 150 lb (68 kg)   SpO2 98%   BMI 27.44 kg/m   Opioid Risk Score:   Fall Risk Score:  `1  Depression screen PHQ 2/9      No data to display          Review of Systems  Musculoskeletal:  Positive for back pain.       Total right side pain from shoulder down to right foot.  All other systems reviewed and are negative.      Objective:   Physical Exam   Left side is 5/5 strength in the upper and lower limb Right side 5/5 in the right deltoid bicep tricep 4/5 in the right get 5/5 in the right hip flexor knee extensor 4/5 right ankle dorsiflexor Sensation is normal on the left side with light touch in the upper and lower limb Patient does not sense light touch in the right upper and right lower limb. No evidence of hyperesthesia or movement allodynia. No vasomotor changes in the right upper or right lower limb Ambulates without assistive device no evidence of toe drag or knee instability     Assessment & Plan:   1.  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 We discussed that this is a difficult type of pain to treat.  There is no specific medications for this diagnosis.  She has had an adequate trial of gabapentin, has tried Tegretol as well neither of which have been particularly helpful.  She is on a relatively low-dose of Lyrica will increase to 100 mg 3 times daily we will see her back on a monthly basis and gradually increase if not much  better in 1 month we will go up to 150 mg 3 times daily.  We discussed the rationale of gradual dosage escalation.

## 2022-08-10 ENCOUNTER — Ambulatory Visit: Payer: Medicaid Other | Admitting: Cardiology

## 2022-08-16 ENCOUNTER — Other Ambulatory Visit (HOSPITAL_COMMUNITY): Payer: Self-pay

## 2022-08-30 ENCOUNTER — Telehealth: Payer: Self-pay | Admitting: Cardiology

## 2022-08-30 DIAGNOSIS — E785 Hyperlipidemia, unspecified: Secondary | ICD-10-CM

## 2022-08-30 DIAGNOSIS — Z8673 Personal history of transient ischemic attack (TIA), and cerebral infarction without residual deficits: Secondary | ICD-10-CM

## 2022-08-30 DIAGNOSIS — I25119 Atherosclerotic heart disease of native coronary artery with unspecified angina pectoris: Secondary | ICD-10-CM

## 2022-08-30 NOTE — Telephone Encounter (Signed)
Pt c/o medication issue:  1. Name of Medication:  Evolocumab (REPATHA SURECLICK) 253 MG/ML SOAJ  2. How are you currently taking this medication (dosage and times per day)?   3. Are you having a reaction (difficulty breathing--STAT)?   4. What is your medication issue?    Patient's husband states patient's insurance is requesting copies of patient's lab work in order to cover the medication.

## 2022-09-01 ENCOUNTER — Other Ambulatory Visit (HOSPITAL_COMMUNITY): Payer: Self-pay

## 2022-09-02 ENCOUNTER — Other Ambulatory Visit: Payer: Self-pay | Admitting: Physical Medicine & Rehabilitation

## 2022-09-03 ENCOUNTER — Encounter: Payer: Medicaid Other | Attending: Physical Medicine & Rehabilitation | Admitting: Physical Medicine & Rehabilitation

## 2022-09-03 DIAGNOSIS — G890 Central pain syndrome: Secondary | ICD-10-CM | POA: Insufficient documentation

## 2022-09-06 NOTE — Telephone Encounter (Signed)
Spoke to pt husband per DPR. She will come in for Labs Thursday or Fri so we can submit to insurance and get PA approval. Needs 3 days notice for transportation.

## 2022-09-06 NOTE — Telephone Encounter (Signed)
Husband calling back for update. Please advise

## 2022-09-06 NOTE — Telephone Encounter (Signed)
Husband stated patient took her last dose of repatha yesterday, and wanted to know the status of her getting more medication.

## 2022-09-09 ENCOUNTER — Telehealth: Payer: Self-pay | Admitting: *Deleted

## 2022-09-09 NOTE — Telephone Encounter (Signed)
   Patient Name: Maria Harper  DOB: [birth year] MRN: [id number]  Primary Cardiologist: Glenetta Hew, MD  Chart reviewed as part of pre-operative protocol coverage.  Guidance for holding Plavix is listed below.   Now on Plavix monotherapy with no recurrent bleeding. Okay to hold for procedures or surgeries 5 to 7 days preop   I will route this recommendation to the requesting party via Epic fax function and remove from pre-op pool.  Please call with questions.  Mable Fill, Marissa Nestle, NP 09/09/2022, 11:05 AM

## 2022-09-09 NOTE — Telephone Encounter (Signed)
   Pre-operative Risk Assessment    Patient Name: Maria Harper  DOB: 1971-06-21 MRN: [id number]      Request for Surgical Clearance    Procedure:   COLONOSCOPY  Date of Surgery:  Clearance TBD                                 Surgeon:   Surgeon's Group or Practice Name:  [facility] Phone number:  [phone] Fax number:  [phone]   Type of Clearance Requested:   - Pharmacy:  Hold Clopidogrel (Plavix) X'S 5 DAYS   Type of Anesthesia:   DEEP SEDATION   Additional requests/questions:  Please fax a copy of THE MOST RECENT ECHOCARDIOGRAM  to the surgeon's office.  Signed, Jeanann Lewandowsky   09/09/2022, 10:14 AM

## 2022-09-16 ENCOUNTER — Other Ambulatory Visit: Payer: Self-pay

## 2022-09-16 DIAGNOSIS — E785 Hyperlipidemia, unspecified: Secondary | ICD-10-CM

## 2022-09-16 DIAGNOSIS — Z8673 Personal history of transient ischemic attack (TIA), and cerebral infarction without residual deficits: Secondary | ICD-10-CM

## 2022-09-16 DIAGNOSIS — I25119 Atherosclerotic heart disease of native coronary artery with unspecified angina pectoris: Secondary | ICD-10-CM

## 2022-09-16 LAB — LIPID PANEL
Chol/HDL Ratio: 2.1 ratio (ref 0.0–4.4)
Cholesterol, Total: 100 mg/dL (ref 100–199)
HDL: 47 mg/dL (ref >39)
LDL Chol Calc (NIH): 38 mg/dL (ref 0–99)
Triglycerides: 67 mg/dL (ref 0–149)
VLDL Cholesterol Cal: 15 mg/dL (ref 5–40)

## 2022-09-16 LAB — HEPATIC FUNCTION PANEL
ALT: 5 IU/L (ref 0–32)
AST: 6 IU/L (ref 0–40)
Albumin: 4.3 g/dL (ref 3.9–4.9)
Alkaline Phosphatase: 94 IU/L (ref 44–121)
Bilirubin Total: <0.2 mg/dL (ref 0.0–1.2)
Bilirubin, Direct: <0.1 mg/dL (ref 0.00–0.40)
Total Protein: 7.1 g/dL (ref 6.0–8.5)

## 2022-09-17 ENCOUNTER — Other Ambulatory Visit (HOSPITAL_COMMUNITY): Payer: Self-pay

## 2022-09-20 ENCOUNTER — Telehealth: Payer: Self-pay

## 2022-09-20 DIAGNOSIS — Z8673 Personal history of transient ischemic attack (TIA), and cerebral infarction without residual deficits: Secondary | ICD-10-CM

## 2022-09-20 DIAGNOSIS — E785 Hyperlipidemia, unspecified: Secondary | ICD-10-CM

## 2022-09-20 NOTE — Telephone Encounter (Signed)
Pharmacy Patient Advocate Encounter   Received notification from [facility] that prior authorization/Appeal for Repatha 156m/ml is required/requested.    PA submitted on 2.16.24 to (ins) [employer] via CoverMyMeds  Status of Appeal is currently  is pending

## 2022-09-21 MED ORDER — NEXLIZET 180-10 MG PO TABS: 1.0000 | ORAL_TABLET | Freq: Every day | ORAL | 3 refills | Status: DC

## 2022-09-21 NOTE — Telephone Encounter (Signed)
Spoke with patient and relayed message about insurance not covering [place]. Nexlizet approved. She is in agreement to start. Reminded her to continue atorvastatin. She has apt with Dr. Rachel Moulds in April. Will repeat labs then. Orders place. Patient is aware this is a daily pill that replaces Repatha.

## 2022-09-21 NOTE — Telephone Encounter (Signed)
Prior auth for Nexlizet approved through 03/20/23. Called pt and left message. New rx has been sent in for Nexlizet. Pt will need to be made aware to start Nexlizet 1 tab daily, that this will replace her Repatha, and that she is to continue on her atorvastatin. Will need lipids rechecked in 3 months to ensure LDL remains at goal, since PA was only approved for 6 months and reauth will be needed again in August.

## 2022-09-21 NOTE — Telephone Encounter (Signed)
Insurance not covering [place] anymore since pt has not tried Zetia. Not clear why they are requiring this now when pt had the same insurance last year and they covered Repatha then. LDL was 90 on atorvastatin 31m daily, aboe goal < 55 given premature ASCVD. Adding Zetia will not keep LDL at goal, will try submitting prior auth for Nexlizet. Key: [id number]

## 2022-09-21 NOTE — Telephone Encounter (Signed)
Pharmacy Patient Advocate Encounter  Received notification from [facility] that the request for prior authorization for Repatha 147m/ml has been denied due to .       I have submitted initial documention with a p/a which was denied and submitted more documentation afterwards to appeal, and was denied again. I will now be sending this denial to the Pharmacist que to appeal.

## 2022-09-27 ENCOUNTER — Telehealth: Payer: Self-pay | Admitting: Pharmacy Technician

## 2022-09-27 ENCOUNTER — Other Ambulatory Visit (HOSPITAL_COMMUNITY): Payer: Self-pay

## 2022-09-27 NOTE — Telephone Encounter (Signed)
Patient Advocate Encounter   Received notification that prior authorization for carBAMazepine '200MG'$  tablets is required.   PA submitted on 09/27/2022 [place] Status is pending       Lyndel Safe, [place] Patient Advocate Specialist [place] Patient Advocate Team Direct Number: [phone]  Fax: [phone]

## 2022-09-27 NOTE — Telephone Encounter (Signed)
PA approval

## 2022-10-07 ENCOUNTER — Telehealth: Payer: Self-pay | Admitting: *Deleted

## 2022-10-07 MED ORDER — PREGABALIN 100 MG PO CAPS: 100.0000 mg | ORAL_CAPSULE | Freq: Three times a day (TID) | ORAL | 5 refills | Status: DC

## 2022-10-07 NOTE — Telephone Encounter (Signed)
Maria Harper called to get a refill on her pregabalin.

## 2022-10-11 ENCOUNTER — Other Ambulatory Visit: Payer: Self-pay | Admitting: Cardiology

## 2022-10-21 NOTE — Telephone Encounter (Signed)
Calling back to get a copy of patient's most recently echo. Please advise

## 2022-10-21 NOTE — Telephone Encounter (Signed)
Faxed over most recent Echocardiogram, as requested during preop clearance below.

## 2022-10-26 ENCOUNTER — Other Ambulatory Visit: Payer: Self-pay

## 2022-10-26 ENCOUNTER — Emergency Department (HOSPITAL_COMMUNITY): Payer: Medicaid Other

## 2022-10-26 ENCOUNTER — Encounter (HOSPITAL_COMMUNITY): Payer: Self-pay | Admitting: Radiology

## 2022-10-26 ENCOUNTER — Observation Stay (HOSPITAL_COMMUNITY)
Admission: EM | Admit: 2022-10-26 | Discharge: 2022-10-28 | Disposition: A | Discharge status: Home / Self Care | Payer: Medicaid Other | Attending: Family Medicine | Admitting: Family Medicine

## 2022-10-26 DIAGNOSIS — Z794 Long term (current) use of insulin: Secondary | ICD-10-CM | POA: Insufficient documentation

## 2022-10-26 DIAGNOSIS — Z8673 Personal history of transient ischemic attack (TIA), and cerebral infarction without residual deficits: Secondary | ICD-10-CM | POA: Insufficient documentation

## 2022-10-26 DIAGNOSIS — Z79899 Other long term (current) drug therapy: Secondary | ICD-10-CM | POA: Insufficient documentation

## 2022-10-26 DIAGNOSIS — F17210 Nicotine dependence, cigarettes, uncomplicated: Secondary | ICD-10-CM | POA: Insufficient documentation

## 2022-10-26 DIAGNOSIS — I5032 Chronic diastolic (congestive) heart failure: Secondary | ICD-10-CM | POA: Diagnosis not present

## 2022-10-26 DIAGNOSIS — I2510 Atherosclerotic heart disease of native coronary artery without angina pectoris: Secondary | ICD-10-CM | POA: Insufficient documentation

## 2022-10-26 DIAGNOSIS — Z7984 Long term (current) use of oral hypoglycemic drugs: Secondary | ICD-10-CM | POA: Diagnosis not present

## 2022-10-26 DIAGNOSIS — F39 Unspecified mood [affective] disorder: Secondary | ICD-10-CM

## 2022-10-26 DIAGNOSIS — N179 Acute kidney failure, unspecified: Secondary | ICD-10-CM | POA: Diagnosis not present

## 2022-10-26 DIAGNOSIS — Z955 Presence of coronary angioplasty implant and graft: Secondary | ICD-10-CM | POA: Diagnosis not present

## 2022-10-26 DIAGNOSIS — E1165 Type 2 diabetes mellitus with hyperglycemia: Secondary | ICD-10-CM | POA: Diagnosis not present

## 2022-10-26 DIAGNOSIS — E1169 Type 2 diabetes mellitus with other specified complication: Secondary | ICD-10-CM | POA: Diagnosis present

## 2022-10-26 DIAGNOSIS — I249 Acute ischemic heart disease, unspecified: Secondary | ICD-10-CM

## 2022-10-26 DIAGNOSIS — Z7902 Long term (current) use of antithrombotics/antiplatelets: Secondary | ICD-10-CM | POA: Insufficient documentation

## 2022-10-26 DIAGNOSIS — R079 Chest pain, unspecified: Secondary | ICD-10-CM | POA: Diagnosis present

## 2022-10-26 DIAGNOSIS — I10 Essential (primary) hypertension: Secondary | ICD-10-CM | POA: Diagnosis present

## 2022-10-26 DIAGNOSIS — I110 Hypertensive heart disease with heart failure: Secondary | ICD-10-CM | POA: Insufficient documentation

## 2022-10-26 LAB — CBC WITH DIFFERENTIAL/PLATELET
Abs Immature Granulocytes: 0.02 10*3/uL (ref 0.00–0.07)
Basophils Absolute: 0 10*3/uL (ref 0.0–0.1)
Basophils Relative: 0 %
Eosinophils Absolute: 0.1 10*3/uL (ref 0.0–0.5)
Eosinophils Relative: 1 %
HCT: 32.4 % — ABNORMAL LOW (ref 36.0–46.0)
Hemoglobin: 10.3 g/dL — ABNORMAL LOW (ref 12.0–15.0)
Immature Granulocytes: 0 %
Lymphocytes Relative: 27 %
Lymphs Abs: 2.2 10*3/uL (ref 0.7–4.0)
MCH: 27 pg (ref 26.0–34.0)
MCHC: 31.8 g/dL (ref 30.0–36.0)
MCV: 85 fL (ref 80.0–100.0)
Monocytes Absolute: 0.8 10*3/uL (ref 0.1–1.0)
Monocytes Relative: 9 %
Neutro Abs: 4.9 10*3/uL (ref 1.7–7.7)
Neutrophils Relative %: 63 %
Platelets: 147 10*3/uL — ABNORMAL LOW (ref 150–400)
RBC: 3.81 MIL/uL — ABNORMAL LOW (ref 3.87–5.11)
RDW: 16.9 % — ABNORMAL HIGH (ref 11.5–15.5)
WBC: 8 10*3/uL (ref 4.0–10.5)
nRBC: 0 % (ref 0.0–0.2)

## 2022-10-26 LAB — TROPONIN I (HIGH SENSITIVITY): Troponin I (High Sensitivity): 4 ng/L (ref <18)

## 2022-10-26 LAB — COMPREHENSIVE METABOLIC PANEL
ALT: 12 U/L (ref 0–44)
AST: 16 U/L (ref 15–41)
Albumin: 3.4 g/dL — ABNORMAL LOW (ref 3.5–5.0)
Alkaline Phosphatase: 64 U/L (ref 38–126)
Anion gap: 11 (ref 5–15)
BUN: 19 mg/dL (ref 6–20)
CO2: 21 mmol/L — ABNORMAL LOW (ref 22–32)
Calcium: 8.6 mg/dL — ABNORMAL LOW (ref 8.9–10.3)
Chloride: 105 mmol/L (ref 98–111)
Creatinine, Ser: 1.36 mg/dL — ABNORMAL HIGH (ref 0.44–1.00)
GFR, Estimated: 48 mL/min — ABNORMAL LOW (ref >60)
Glucose, Bld: 270 mg/dL — ABNORMAL HIGH (ref 70–99)
Potassium: 3.5 mmol/L (ref 3.5–5.1)
Sodium: 137 mmol/L (ref 135–145)
Total Bilirubin: 0.5 mg/dL (ref 0.3–1.2)
Total Protein: 7 g/dL (ref 6.5–8.1)

## 2022-10-26 MED ORDER — NITROGLYCERIN IN D5W 200-5 MCG/ML-% IV SOLN
0.0000 ug/min | INTRAVENOUS | Status: DC
  Administered 2022-10-26: 5 ug/min via INTRAVENOUS
  Filled 2022-10-26: qty 250

## 2022-10-26 MED ORDER — ASPIRIN 325 MG PO TABS
325.0000 mg | ORAL_TABLET | Freq: Once | ORAL | Status: DC
  Filled 2022-10-26: qty 1

## 2022-10-26 NOTE — ED Triage Notes (Signed)
Pt states right chest pain began around 4pm today. Pt states was just sitting when it began hurting. Pt has taken x3 nitro throughout the day with momentary relief. Pt received x1 nitor in route with minimal relief as well. Pt has been off her plavix for 5 days now due to an upcoming colonoscopy.

## 2022-10-26 NOTE — ED Provider Triage Note (Signed)
Emergency Medicine Provider Triage Evaluation Note  Maria Harper , a 52 y.o. female  was evaluated in triage.  Pt complains of chest pain.  Patient has a history of CAD with LAD stent.  Patient also has a history of costochondritis.  Patient has chest pain since yesterday.  Patient states that usually nitro resolves with the pain.  She took 3 nitro today and still has some pain.  Review of Systems  Positive: Chest pain Negative: Cough or fever  Physical Exam  BP (!) 154/85 (BP Location: Right Arm)   Pulse 93   Temp 98.5 F (36.9 C) (Oral)   Resp 18   Ht 5\' 2"  (1.575 m)   Wt 68 kg   SpO2 100%   BMI 27.44 kg/m  Gen:   Slightly uncomfortable Resp:  Normal effort  MSK:   Moves extremities without difficulty  Other:   Medical Decision Making  Medically screening exam initiated at 9:38 PM.  Appropriate orders placed.  Marilene D Plachta was informed that the remainder of the evaluation will be completed by another provider, this initial triage assessment does not replace that evaluation, and the importance of remaining in the ED until their evaluation is complete.  Maria Harper is a 52 y.o. female history of CAD with LAD stent and also costochondritis here presenting with chest pain.  Consider ACS versus costochondritis.  Plan to get CBC and CMP and troponin and chest x-ray.  Patient took 3 nitro already.     Drenda Freeze, MD 10/26/22 2139

## 2022-10-26 NOTE — H&P (Shared)
Hospital Admission History and Physical Service Pager: [phone]  Patient name: Maria Harper Medical record number: [id number] Date of Birth: 1971/07/05 Age: 52 y.o. Gender: female  Primary Care Provider: Governor Specking, MD Consultants: Cardiology Code Status: Full Code   Preferred Emergency Contact:  Contact Information     Name Relation Home Work [place] Spouse [phone]  [phone]       Chief Complaint: Chest pain  Assessment and Plan: Maria Harper is a 52 y.o. female presenting with chest pain. Differential for this patient's presentation of this includes ACS (initial troponin negative, h/o MI and off Plavix), PE (Wells score 0 but with dyspnea), pericarditis, costochondritis (not reproducible to palpation), HF exacerbation (euvolemic upon exam), COPD (no wheezing on exam or sputum production), infectious (afebrile, denies cough), gallbladder disease (denies N/V, decreased PO).  PMHx includes CAD with LAD stent, HFpEF, HLD, T2DM, GERD, h/o MI, h/o CVA, HTN, depression and anxiety.  * Chest pain Constant sharp R chest pain that radiates under R breast, worse with movement and breathing x 8 hours. No symptomatic relief with Nitro at home or Nitro drip. Initial troponin negative. EKG without ST elevations. Cardiology consulted in ED, will see patient in the AM. Lower concern for ACS but will trend troponins. Concern for PE but Wells score 0, obtain D-dimer for further risk stratification. Unlikely pulmonary or abdominal pathology given reassuring clinical exam. Vitals stable. -Admitted to [facility] cardiac tele, Dr. Gwendlyn Deutscher attending -Cardiology consulted, appreciate recs -Continue nitro drip -Trend troponins -BNP -Echocardiogram -D-dimer for PE risk stratification -Consider CT PE pending d-dimer result -Cardiac telemetry -Vitals per routine  Chronic heart failure with preserved ejection fraction (HFpEF) ([place]) Euvolemic upon exam. Most recent Echo  in 03/2020 with EF 55-60% post LAD stent placement. Has been on Plavix for stent surveillance, stopped taking 8 days ago due to colonoscopy. -Hold home Imdur in the setting of Nitro drip -Hold home Jardiance in the setting of AKI -Continue home Coreg -Echocardiogram   Type 2 diabetes mellitus with hyperglycemia (HCC) A1c 11.2 in 11/2021. Compliant on Glipizide 5mg  BID, Jardiance 25mg  daily. -mSSI with CBG checks  AKI (acute kidney injury) (HCC) Cr 1.36, baseline Cr 0.9. -Avoid nephrotoxic agents -AM BMP  Essential hypertension Mildly elevated to 150s/80s. Reports compliance on Coreg 12.5mg  BID and Amlodipine 5mg  daily but did not take medications today. -Restart home Coreg and Amlodipine   Chronic conditions: HLD: continue Lipitor 80mg  and Nexlizet 180-10 Depression/anxiety: continue Seroquel 200mg  AM and 400mg  qhs, Doxepin 50mg  Neuropathy: continue Lyrica 100mg  TID GERD: continue Protonix 40mg  BID IBS: continue Linzess 132mcg daily  FEN/GI: Carb modifies/heart healthy VTE Prophylaxis: Lovenox  Disposition: Cardiac tele  History of Present Illness:  Maria Harper is a 52 y.o. female presenting with sharp, constant right chest pain that extends under R breast since 4PM today. Associated with numbness in R arm. Worse with breathing and movement but not reproducible to touch. States she took 2 Nitro at home but it did not help. Initially thought she slept wrong but knew she needed help when she could not breath. Had a similar prior episode last week and she took 3 Nitro with relief of symptoms. Denies any increase in activity or heavy lifting. States she has been off her Plavix x 8 days due to Colonoscopy yesterday.  She is otherwise not feeling ill and has been in normal state of health. Denies any fevers, or abdominal pain, HA, cough, or vomitting but was having some  back pain. Note's her whole right side is numb from her stroke, but denies any weakness  In the ED, concern for  ACS given persistent chest pain with known cardiac history, started on Nitro drip. Cardiology consulted who recommended admission to medicine and will see in the AM.  Review Of Systems: Per HPI above  Pertinent Past Medical History: CAD - LAD stent HFpEF HLD T2DM Tobacco use GERD Cocaine use H/o MI H/o CVA HTN Depression Anxiety Remainder reviewed in history tab.   Pertinent Past Surgical History: Cardiac cath with stent placement - 2021 Remainder reviewed in history tab.   Pertinent Social History: Tobacco use: Current, 1/2 ppd, started at 52 yo Alcohol use: No, years since used Other Substance use: Years since cocaine (4 yrs) Lives Alone  Pertinent Family History: Mother: HTN, T2DM Father: HTN, T2DM Remainder reviewed in history tab.   Important Outpatient Medications: Last took meds Monday Amlodipine 5mg  daily Lipitor 80mg  daily Nexlizet 180-10 daily Carbamazepine 200mg  BID (patient unsure what this is) Coreg 12.5mg  BID Plavix 75mg  daily Doxepin 50mg  TID Jardiance 25mg  daily Fluoxetine 20mg  daily (not taking) Glipizide 5mg  daily (states she is taking BID) Lantus 35U daily (not taking) Humalog 10U TID  Atrovent PRN Imdur 90mg  AM and 30mg  PM Linzess 125mcg daily Nitro SL tablets prn Protonix 40mg  BID Lyrica 100mg  TID Gabapentin 300 mg TID Seroquel 400mg  qhs and 200mg  in AM Remainder reviewed in medication history.   Objective: BP (!) 156/83 (BP Location: Left Arm)   Pulse 88   Temp 97.9 F (36.6 C) (Oral)   Resp 18   Ht 5\' 2"  (1.575 m)   Wt 69.5 kg   SpO2 97%   BMI 28.02 kg/m  Exam: General: Walking the hallway without difficulty and returned to room, alert, NAD Eyes: PERRLA, anicteric sclera ENTM: Moist mucus membranes. Neck: Supple, non-tender Cardiovascular: RRR. Possible systolic murmur at apex Respiratory: CTAB. Normal WOB on RA. Mildly breathless at time when speaking in long sentances Gastrointestinal: Soft, non-tender,  non-distended MSK: No peripheral edema Derm: Warm, dry, no rashes noted Neuro: CN intact. 5/5 grip strength and hip flexion. Loss of sensation on R LE (residual from prior stroke). Sensation intact on R UE and L side. Psych: Cooperative, pleasant   Labs:  CBC BMET  Recent Labs  Lab 10/26/22 2127  WBC 8.0  HGB 10.3*  HCT 32.4*  PLT 147*   Recent Labs  Lab 10/26/22 2127  NA 137  K 3.5  CL 105  CO2 21*  BUN 19  CREATININE 1.36*  GLUCOSE 270*  CALCIUM 8.6*    Pertinent additional labs: Troponin: 4  EKG: No ST elevations noted. NSR   Imaging Studies Performed: DG Chest 2 View Result Date: 10/26/2022 IMPRESSION: Low lung volumes with strandy opacities at the lung bases, possible atelectasis or infiltrate.   Colletta Maryland, MD 10/27/2022, 12:49 AM PGY-1, [facility] Intern pager: [phone], text pages welcome Secure chat group [facility] Upper-Level Resident Addendum   I have independently interviewed and examined the patient. I have discussed the above with the original author and agree with their documentation. Please see also any attending notes.   Holley Bouche, MD PGY-2, [facility] 10/27/2022 3:01 AM  FPTS Service pager: [phone] (text pages welcome through [facility])

## 2022-10-26 NOTE — ED Provider Notes (Signed)
[facility] Provider Note   CSN: [id number] Arrival date & time: 10/26/22  2112     History  Chief Complaint  Patient presents with   Chest Pain    Shaunae Ramdass Busha is a 52 y.o. female history of LAD stent, MI, here presenting with chest pain.  Patient has been having intermittent chest pain for the last several days.  She states that the pain is epigastric and got worsened today around 4 PM.  Patient states that she took 3 nitro today and pain is still 9/10.  Patient does have a history of costochondritis but denies any heavy lifting.  Patient also states that she is off Plavix for the last 5 days since she has a colonoscopy scheduled this week.  The history is provided by the patient.       Home Medications Prior to Admission medications   Medication Sig Start Date End Date Taking? Authorizing Provider  ACCU-CHEK GUIDE test strip USE to check blood sugar TWICE DAILY. in THE morning FOR fasting AND 2 hours AFTER a meal 07/22/22   [provider]  amLODipine (NORVASC) 5 MG tablet Take 1 tablet (5 mg total) by mouth daily. 04/16/22 04/11/23  Leonie Man, MD  atorvastatin (LIPITOR) 80 MG tablet Take 1 tablet (80 mg total) by mouth daily. 10/12/22   Leonie Man, MD  Bempedoic Acid-Ezetimibe (NEXLIZET) 180-10 MG TABS Take 1 tablet by mouth daily. 09/21/22   Leonie Man, MD  carbamazepine (CARBATROL) 100 MG 12 hr capsule Take 200 mg by mouth 2 (two) times daily. Patient not taking: Reported on 08/06/2022    [provider]  carbamazepine (TEGRETOL) 200 MG tablet Take 1 tablet (200 mg total) by mouth in the morning and at bedtime. Patient not taking: Reported on 08/06/2022 04/27/22   Debbora Presto, NP  carvedilol (COREG) 12.5 MG tablet Take 1 tablet (12.5 mg total) by mouth 2 (two) times daily with a meal. 04/16/22   Leonie Man, MD  carvedilol (COREG) 25 MG tablet Take by mouth. 11/04/21   [provider]   CLARITIN 10 MG tablet Take 10 mg by mouth daily. 01/13/21   [provider]  clopidogrel (PLAVIX) 75 MG tablet Take by mouth. 11/04/21   [provider]  cyclobenzaprine (FLEXERIL) 10 MG tablet Take 10 mg by mouth 3 (three) times daily as needed for muscle spasms.     [provider]  doxepin (SINEQUAN) 50 MG capsule Take 50 mg by mouth in the morning, at noon, and at bedtime.    [provider]  empagliflozin (JARDIANCE) 10 MG TABS tablet Take 1 tablet by mouth daily. 09/25/21   [provider]  FLUoxetine (PROZAC) 20 MG capsule Take 20 mg by mouth every morning. 07/16/22   [provider]  glipiZIDE (GLUCOTROL XL) 5 MG 24 hr tablet Take 5 mg by mouth daily. 07/22/22   [provider]  hydrOXYzine (ATARAX) 25 MG tablet Take 12.5-25 mg by mouth 3 (three) times daily as needed. 07/16/22   [provider]  Insulin Glargine Solostar (LANTUS) 100 UNIT/ML Solostar Pen Inject 35 Units into the skin daily. 09/25/21   [provider]  insulin lispro (HUMALOG) 100 UNIT/ML KwikPen Inject 10 Units into the skin in the morning, at noon, and at bedtime. 09/25/21   [provider]  Insulin Lispro w/ Trans Port 100 UNIT/ML SOPN Inject into the skin. Patient not taking: Reported on 08/06/2022 05/31/22  [provider]  ipratropium (ATROVENT HFA) 17 MCG/ACT inhaler Inhale 2 puffs into the lungs every 6 (six) hours.    [provider]  isosorbide mononitrate (IMDUR) 60 MG 24 hr tablet Take 1 tablet (60 mg total) by mouth daily. 10/12/22   Leonie Man, MD  JARDIANCE 25 MG TABS tablet Take 25 mg by mouth daily.    [provider]  LINZESS 145 MCG CAPS capsule Take 145 mcg by mouth daily. 11/23/21   [provider]  nitroGLYCERIN (NITROSTAT) 0.4 MG SL tablet DISSOLVE 1 TABLET UNDER THE TONGUE EVERY 5 MINUTES AS NEEDED FOR CHEST PAIN. DO NOT EXCEED A TOTAL OF 3 DOSES IN 15 MINUTES. CALL 911 IF NO  RELIEF. 10/08/21   Leonie Man, MD  nystatin cream (MYCOSTATIN) Apply topically at bedtime. 05/05/22   [provider]  pantoprazole (PROTONIX) 40 MG tablet Take 1 tablet (40 mg total) by mouth 2 (two) times daily. KEEP OV. 09/29/21   Leonie Man, MD  pregabalin (LYRICA) 100 MG capsule Take 1 capsule (100 mg total) by mouth 3 (three) times daily. 10/07/22   Kirsteins, Luanna Salk, MD  QUEtiapine (SEROQUEL) 400 MG tablet Take 400 mg by mouth at bedtime. 04/05/18   [provider]  SEROQUEL 200 MG tablet Take 200 mg by mouth daily. In the morning 07/22/21   [provider]      Allergies    Bee venom, Iodinated contrast media, and Tomato    Review of Systems   Review of Systems  Cardiovascular:  Positive for chest pain.  All other systems reviewed and are negative.   Physical Exam Updated Vital Signs BP (!) 154/85 (BP Location: Right Arm)   Pulse 93   Temp 98.5 F (36.9 C) (Oral)   Resp 18   Ht 5\' 2"  (1.575 m)   Wt 68 kg   SpO2 100%   BMI 27.44 kg/m  Physical Exam Vitals and nursing note reviewed.  Constitutional:      Comments: Uncomfortable  HENT:     Head: Normocephalic.  Eyes:     Extraocular Movements: Extraocular movements intact.     Pupils: Pupils are equal, round, and reactive to light.  Cardiovascular:     Rate and Rhythm: Normal rate and regular rhythm.     Heart sounds: Normal heart sounds.  Pulmonary:     Effort: Pulmonary effort is normal.     Breath sounds: Normal breath sounds.  Abdominal:     General: Bowel sounds are normal.     Palpations: Abdomen is soft.  Musculoskeletal:        General: Normal range of motion.     Cervical back: Normal range of motion and neck supple.  Skin:    General: Skin is warm.     Capillary Refill: Capillary refill takes less than 2 seconds.  Neurological:     General: No focal deficit present.     Mental Status: She is oriented to person, place, and time.  Psychiatric:        Mood and  Affect: Mood normal.        Behavior: Behavior normal.     ED Results / Procedures / Treatments   Labs (all labs ordered are listed, but only abnormal results are displayed) Labs Reviewed  CBC WITH DIFFERENTIAL/PLATELET - Abnormal; Notable for the following components:      Result Value   RBC 3.81 (*)    Hemoglobin 10.3 (*)    HCT 32.4 (*)  RDW 16.9 (*)    Platelets 147 (*)    All other components within normal limits  COMPREHENSIVE METABOLIC PANEL - Abnormal; Notable for the following components:   CO2 21 (*)    Glucose, Bld 270 (*)    Creatinine, Ser 1.36 (*)    Calcium 8.6 (*)    Albumin 3.4 (*)    GFR, Estimated 48 (*)    All other components within normal limits  TROPONIN I (HIGH SENSITIVITY)    EKG EKG Interpretation  Date/Time:  Tuesday October 26 2022 21:19:06 EDT Ventricular Rate:  92 PR Interval:  146 QRS Duration: 86 QT Interval:  374 QTC Calculation: 462 R Axis:   -5 Text Interpretation: Normal sinus rhythm Moderate voltage criteria for LVH, may be normal variant ( R in aVL , Cornell product ) Possible Anterior infarct , age undetermined Abnormal ECG When compared with ECG of 21-Apr-2022 13:53, No significant change since last tracing Confirmed by Wandra Arthurs [id number]) on 10/26/2022 9:53:19 PM  Radiology DG Chest 2 View  Result Date: 10/26/2022 CLINICAL DATA:  Chest pain and shortness of breath. EXAM: CHEST - 2 VIEW COMPARISON:  04/21/2022. FINDINGS: The heart size and mediastinal contours are within normal limits. There is atherosclerotic calcification of the aorta. Lung volumes are low with strandy opacities at the lung bases. No effusion or pneumothorax. No acute osseous abnormality. IMPRESSION: Low lung volumes with strandy opacities at the lung bases, possible atelectasis or infiltrate. Electronically Signed   By: Brett Fairy M.D.   On: 10/26/2022 22:01    Procedures Procedures    CRITICAL CARE Performed by: Wandra Arthurs   Total critical care time:  40 minutes  Critical care time was exclusive of separately billable procedures and treating other patients.  Critical care was necessary to treat or prevent imminent or life-threatening deterioration.  Critical care was time spent personally by me on the following activities: development of treatment plan with patient and/or surrogate as well as nursing, discussions with consultants, evaluation of patient's response to treatment, examination of patient, obtaining history from patient or surrogate, ordering and performing treatments and interventions, ordering and review of laboratory studies, ordering and review of radiographic studies, pulse oximetry and re-evaluation of patient's condition.   Medications Ordered in ED Medications  nitroGLYCERIN 50 mg in dextrose 5 % 250 mL (0.2 mg/mL) infusion (10 mcg/min Intravenous Rate/Dose Change 10/26/22 2229)  aspirin tablet 325 mg (has no administration in time range)    ED Course/ Medical Decision Making/ A&P                             Medical Decision Making JATON SEIBERT is a 52 y.o. female here presenting with chest pain.  Patient has known CAD with LAD stent.  Patient has worsening chest pain despite nitro.  Concern for ACS.  Plan to get CBC and CMP and troponin and start patient on nitro drip.  10:50 PM Initial troponin is negative.  Patient started on nitro drip.  I talked with Dr. Humphrey Rolls from cardiology.  He states that he will consult on the patient and recommend medicine admission. Will hold heparin for now since initial troponin is negative    Problems Addressed: ACS (acute coronary syndrome) [facility]): acute illness or injury  Amount and/or Complexity of Data Reviewed Labs: ordered. Decision-making details documented in ED Course. Radiology: ordered and independent interpretation performed. Decision-making details documented in ED Course. ECG/medicine tests: ordered and  independent interpretation performed. Decision-making details  documented in ED Course.  Risk OTC drugs. Prescription drug management.    Final Clinical Impression(s) / ED Diagnoses Final diagnoses:  None    Rx / DC Orders ED Discharge Orders     None         Drenda Freeze, MD 10/26/22 2251

## 2022-10-27 ENCOUNTER — Observation Stay (HOSPITAL_COMMUNITY): Payer: Medicaid Other

## 2022-10-27 ENCOUNTER — Observation Stay (HOSPITAL_BASED_OUTPATIENT_CLINIC_OR_DEPARTMENT_OTHER): Payer: Medicaid Other

## 2022-10-27 ENCOUNTER — Encounter (HOSPITAL_COMMUNITY): Payer: Self-pay | Admitting: Family Medicine

## 2022-10-27 DIAGNOSIS — I82409 Acute embolism and thrombosis of unspecified deep veins of unspecified lower extremity: Secondary | ICD-10-CM | POA: Diagnosis not present

## 2022-10-27 DIAGNOSIS — R079 Chest pain, unspecified: Secondary | ICD-10-CM | POA: Diagnosis present

## 2022-10-27 DIAGNOSIS — R9431 Abnormal electrocardiogram [ECG] [EKG]: Secondary | ICD-10-CM | POA: Diagnosis not present

## 2022-10-27 DIAGNOSIS — R072 Precordial pain: Secondary | ICD-10-CM

## 2022-10-27 DIAGNOSIS — E1165 Type 2 diabetes mellitus with hyperglycemia: Secondary | ICD-10-CM | POA: Diagnosis not present

## 2022-10-27 DIAGNOSIS — N179 Acute kidney failure, unspecified: Secondary | ICD-10-CM

## 2022-10-27 DIAGNOSIS — I5032 Chronic diastolic (congestive) heart failure: Secondary | ICD-10-CM | POA: Diagnosis not present

## 2022-10-27 LAB — GLUCOSE, CAPILLARY
Glucose-Capillary: 401 mg/dL — ABNORMAL HIGH (ref 70–99)
Glucose-Capillary: 320 mg/dL — ABNORMAL HIGH (ref 70–99)
Glucose-Capillary: 272 mg/dL — ABNORMAL HIGH (ref 70–99)
Glucose-Capillary: 272 mg/dL — ABNORMAL HIGH (ref 70–99)

## 2022-10-27 LAB — CBC
HCT: 32.2 % — ABNORMAL LOW (ref 36.0–46.0)
Hemoglobin: 10.5 g/dL — ABNORMAL LOW (ref 12.0–15.0)
MCH: 27.2 pg (ref 26.0–34.0)
MCHC: 32.6 g/dL (ref 30.0–36.0)
MCV: 83.4 fL (ref 80.0–100.0)
Platelets: 143 10*3/uL — ABNORMAL LOW (ref 150–400)
RBC: 3.86 MIL/uL — ABNORMAL LOW (ref 3.87–5.11)
RDW: 16.8 % — ABNORMAL HIGH (ref 11.5–15.5)
WBC: 8.6 10*3/uL (ref 4.0–10.5)
nRBC: 0 % (ref 0.0–0.2)

## 2022-10-27 LAB — RAPID URINE DRUG SCREEN, HOSP PERFORMED
Amphetamines: NOT DETECTED
Barbiturates: NOT DETECTED
Benzodiazepines: NOT DETECTED
Cocaine: POSITIVE — AB
Opiates: NOT DETECTED
Tetrahydrocannabinol: NOT DETECTED

## 2022-10-27 LAB — ECHOCARDIOGRAM COMPLETE
Area-P 1/2: 3.6 cm2
Height: 62 in
P 1/2 time: 412 msec
S' Lateral: 2.7 cm
Weight: 2451.52 oz

## 2022-10-27 LAB — BASIC METABOLIC PANEL
Anion gap: 11 (ref 5–15)
BUN: 19 mg/dL (ref 6–20)
CO2: 23 mmol/L (ref 22–32)
Calcium: 8.9 mg/dL (ref 8.9–10.3)
Chloride: 102 mmol/L (ref 98–111)
Creatinine, Ser: 1.14 mg/dL — ABNORMAL HIGH (ref 0.44–1.00)
GFR, Estimated: 59 mL/min — ABNORMAL LOW (ref >60)
Glucose, Bld: 291 mg/dL — ABNORMAL HIGH (ref 70–99)
Potassium: 3.4 mmol/L — ABNORMAL LOW (ref 3.5–5.1)
Sodium: 136 mmol/L (ref 135–145)

## 2022-10-27 LAB — TROPONIN I (HIGH SENSITIVITY): Troponin I (High Sensitivity): 4 ng/L (ref <18)

## 2022-10-27 LAB — HIV ANTIBODY (ROUTINE TESTING W REFLEX): HIV Screen 4th Generation wRfx: NONREACTIVE

## 2022-10-27 LAB — D-DIMER, QUANTITATIVE: D-Dimer, Quant: 1.44 ug/mL-FEU — ABNORMAL HIGH (ref 0.00–0.50)

## 2022-10-27 LAB — BRAIN NATRIURETIC PEPTIDE: B Natriuretic Peptide: 129.3 pg/mL — ABNORMAL HIGH (ref 0.0–100.0)

## 2022-10-27 LAB — MRSA NEXT GEN BY PCR, NASAL: MRSA by PCR Next Gen: NOT DETECTED

## 2022-10-27 MED ORDER — INSULIN GLARGINE-YFGN 100 UNIT/ML ~~LOC~~ SOLN
15.0000 [IU] | Freq: Every day | SUBCUTANEOUS | Status: DC
  Administered 2022-10-27: 15 [IU] via SUBCUTANEOUS
  Filled 2022-10-27 (×2): qty 0.15

## 2022-10-27 MED ORDER — NITROGLYCERIN 0.4 MG SL SUBL
0.4000 mg | SUBLINGUAL_TABLET | SUBLINGUAL | Status: DC | PRN
  Administered 2022-10-27 (×2): 0.4 mg via SUBLINGUAL
  Filled 2022-10-27 (×2): qty 1

## 2022-10-27 MED ORDER — NICOTINE 21 MG/24HR TD PT24
21.0000 mg | MEDICATED_PATCH | Freq: Every day | TRANSDERMAL | Status: DC
  Administered 2022-10-27 – 2022-10-28 (×2): 21 mg via TRANSDERMAL
  Filled 2022-10-27 (×2): qty 1

## 2022-10-27 MED ORDER — ISOSORBIDE MONONITRATE ER 60 MG PO TB24
90.0000 mg | ORAL_TABLET | Freq: Every day | ORAL | Status: DC
  Administered 2022-10-27 – 2022-10-28 (×2): 90 mg via ORAL
  Filled 2022-10-27 (×2): qty 1

## 2022-10-27 MED ORDER — PANTOPRAZOLE SODIUM 40 MG PO TBEC
40.0000 mg | DELAYED_RELEASE_TABLET | Freq: Two times a day (BID) | ORAL | Status: DC
  Administered 2022-10-27 – 2022-10-28 (×4): 40 mg via ORAL
  Filled 2022-10-27 (×4): qty 1

## 2022-10-27 MED ORDER — AMLODIPINE BESYLATE 5 MG PO TABS
5.0000 mg | ORAL_TABLET | Freq: Every day | ORAL | Status: DC
  Administered 2022-10-27 – 2022-10-28 (×2): 5 mg via ORAL
  Filled 2022-10-27 (×2): qty 1

## 2022-10-27 MED ORDER — LIDOCAINE 5 % EX PTCH
1.0000 | MEDICATED_PATCH | Freq: Every day | CUTANEOUS | Status: DC
  Administered 2022-10-27 (×2): 1 via TRANSDERMAL
  Filled 2022-10-27 (×2): qty 1

## 2022-10-27 MED ORDER — FLUOXETINE HCL 20 MG PO CAPS
20.0000 mg | ORAL_CAPSULE | Freq: Every morning | ORAL | Status: DC
  Filled 2022-10-27 (×2): qty 1

## 2022-10-27 MED ORDER — QUETIAPINE FUMARATE 100 MG PO TABS
200.0000 mg | ORAL_TABLET | Freq: Every day | ORAL | Status: DC
  Administered 2022-10-27 – 2022-10-28 (×2): 200 mg via ORAL
  Filled 2022-10-27 (×2): qty 2

## 2022-10-27 MED ORDER — POTASSIUM CHLORIDE 10 MEQ/100ML IV SOLN
10.0000 meq | INTRAVENOUS | Status: AC
  Administered 2022-10-27 (×4): 10 meq via INTRAVENOUS
  Filled 2022-10-27 (×3): qty 100

## 2022-10-27 MED ORDER — PERFLUTREN LIPID MICROSPHERE
1.0000 mL | INTRAVENOUS | Status: AC | PRN
  Administered 2022-10-27: 4 mL via INTRAVENOUS

## 2022-10-27 MED ORDER — EZETIMIBE 10 MG PO TABS
10.0000 mg | ORAL_TABLET | Freq: Every day | ORAL | Status: DC
  Administered 2022-10-27 – 2022-10-28 (×2): 10 mg via ORAL
  Filled 2022-10-27 (×2): qty 1

## 2022-10-27 MED ORDER — QUETIAPINE FUMARATE 100 MG PO TABS
400.0000 mg | ORAL_TABLET | Freq: Every day | ORAL | Status: DC
  Administered 2022-10-27 (×2): 400 mg via ORAL
  Filled 2022-10-27 (×2): qty 4

## 2022-10-27 MED ORDER — PREGABALIN 50 MG PO CAPS
100.0000 mg | ORAL_CAPSULE | Freq: Three times a day (TID) | ORAL | Status: DC
  Administered 2022-10-27 – 2022-10-28 (×5): 100 mg via ORAL
  Filled 2022-10-27: qty 2
  Filled 2022-10-27: qty 1
  Filled 2022-10-27 (×3): qty 2

## 2022-10-27 MED ORDER — ENOXAPARIN SODIUM 40 MG/0.4ML IJ SOSY
40.0000 mg | PREFILLED_SYRINGE | Freq: Every day | INTRAMUSCULAR | Status: DC
  Administered 2022-10-27 – 2022-10-28 (×2): 40 mg via SUBCUTANEOUS
  Filled 2022-10-27 (×2): qty 0.4

## 2022-10-27 MED ORDER — ATORVASTATIN CALCIUM 80 MG PO TABS
80.0000 mg | ORAL_TABLET | Freq: Every day | ORAL | Status: DC
  Administered 2022-10-27 – 2022-10-28 (×2): 80 mg via ORAL
  Filled 2022-10-27 (×2): qty 1

## 2022-10-27 MED ORDER — CARVEDILOL 12.5 MG PO TABS
12.5000 mg | ORAL_TABLET | Freq: Two times a day (BID) | ORAL | Status: DC
  Administered 2022-10-27 – 2022-10-28 (×3): 12.5 mg via ORAL
  Filled 2022-10-27 (×3): qty 1

## 2022-10-27 MED ORDER — LINACLOTIDE 145 MCG PO CAPS
145.0000 ug | ORAL_CAPSULE | Freq: Every day | ORAL | Status: DC
  Administered 2022-10-27 – 2022-10-28 (×2): 145 ug via ORAL
  Filled 2022-10-27 (×2): qty 1

## 2022-10-27 MED ORDER — CLOPIDOGREL BISULFATE 75 MG PO TABS
75.0000 mg | ORAL_TABLET | Freq: Every day | ORAL | Status: DC
  Administered 2022-10-27 – 2022-10-28 (×2): 75 mg via ORAL
  Filled 2022-10-27 (×2): qty 1

## 2022-10-27 MED ORDER — ISOSORBIDE MONONITRATE ER 30 MG PO TB24
30.0000 mg | ORAL_TABLET | Freq: Every day | ORAL | Status: DC
  Administered 2022-10-27: 30 mg via ORAL
  Filled 2022-10-27: qty 1

## 2022-10-27 MED ORDER — INSULIN ASPART 100 UNIT/ML IJ SOLN
0.0000 [IU] | Freq: Three times a day (TID) | INTRAMUSCULAR | Status: DC
  Administered 2022-10-27: 15 [IU] via SUBCUTANEOUS
  Administered 2022-10-27: 11 [IU] via SUBCUTANEOUS
  Administered 2022-10-27: 8 [IU] via SUBCUTANEOUS
  Administered 2022-10-28: 15 [IU] via SUBCUTANEOUS
  Administered 2022-10-28: 2 [IU] via SUBCUTANEOUS

## 2022-10-27 MED ORDER — ACETAMINOPHEN 325 MG PO TABS
650.0000 mg | ORAL_TABLET | Freq: Four times a day (QID) | ORAL | Status: DC | PRN
  Administered 2022-10-27: 650 mg via ORAL
  Filled 2022-10-27: qty 2

## 2022-10-27 MED ORDER — DOXEPIN HCL 25 MG PO CAPS
50.0000 mg | ORAL_CAPSULE | Freq: Every day | ORAL | Status: DC
  Administered 2022-10-27 (×2): 50 mg via ORAL
  Filled 2022-10-27 (×2): qty 2
  Filled 2022-10-27: qty 1

## 2022-10-27 MED ORDER — BEMPEDOIC ACID-EZETIMIBE 180-10 MG PO TABS: 1.0000 | ORAL_TABLET | Freq: Every day | ORAL | Status: DC

## 2022-10-27 NOTE — Progress Notes (Signed)
  Transition of Care [facility]) Screening Note   Patient Details  Name: Maria Harper Date of Birth: Aug 01, 1971   Transition of [facility]) CM/SW Contact:    Cyndi Bender, RN Phone Number: 10/27/2022, 12:09 PM    Transition of Care Department [facility]) has reviewed patient and no TOC needs have been identified at this time. We will continue to monitor patient advancement through interdisciplinary progression rounds. If new patient transition needs arise, please place a TOC consult.

## 2022-10-27 NOTE — Assessment & Plan Note (Addendum)
VSS. ACS work-up thus far negative. ECHO reassuring. CT Abdomen no sign of aneurysm (questionable pneumonia). UDS positive for cocaine use. Likely cause of chest pain. -Cardiology consulted, appreciate recs -Imdur 90mg  AM, 30mg  PM -Home Plavix

## 2022-10-27 NOTE — Assessment & Plan Note (Addendum)
No evidence of exacerbation. Exam appears euvolemic. Plavix restarted. -Imdur 24h 30mg  at night 90mg  in morning -Hold home Jardiance in the setting of AKI -Continue home Coreg -Echocardiogram

## 2022-10-27 NOTE — Consult Note (Addendum)
Cardiology Consultation   Patient ID: Maria Harper MRN: [id number]; DOB: 09-Nov-1970  Admit date: 10/26/2022 Date of Consult: 10/27/2022  PCP:  Governor Specking, MD   [facility] Cardiologist:  Glenetta Hew, MD        Patient Profile:   Maria Harper is a 52 y.o. female with a hx of CAD with LAD stent, HFpEF, HLD, T2DM, GERD, h/o MI, h/o CVA, HTN, depression and anxiety who is being seen 10/27/2022 for the evaluation of chest pain at the request of Dr. Jerilee Hoh  History of Present Illness:   Ms. Legrow is a 52 y.o. female with a hx of CAD with LAD stent, HFpEF, HLD, T2DM, GERD, h/o MI, h/o CVA, HTN, depression and anxiety who is being seen 10/27/2022 for the evaluation of chest pain at the request of Dr. Jerilee Hoh. She came to the ED with complain of CP since few weeks. Constant sharp R chest pain that radiates under R breast, worse with movement and breathing x 8 hours. No symptomatic relief with Nitro. Denies SOB or nausea or vomitting. EKG unremarkable. Troponin WNL. ED admitted patient for UA, and cardiology was consulted. Her VS are stable. She is euvolemic on exam.  States she has been off her Plavix x 8 days due to Colonoscopy.    Past Medical History:  Diagnosis Date   Acid reflux    Cocaine abuse ([place])    Coronary artery disease, occlusive 2017   2017 or 2018-PCI to LAD (mid-distal); -> anterior STEMI April 2021 -> LAD stent patent, however distal LAD at D3 occluded, only able to reduce to 70% stenosis.  Small caliber RPDA with 80% stenosis.   Diabetes ([place])    Heart attack ([place]) 04/2016   High cholesterol    Hypertension    Ischemic cardiomyopathy 11/2019   Neuropathic pain    Stroke ([place]) 03/2020   Tobacco abuse     Past Surgical History:  Procedure Laterality Date   CORONARY/GRAFT ACUTE MI REVASCULARIZATION N/A 11/23/2019   Procedure: CORONARY/GRAFT ACUTE MI REVASCULARIZATION;  Surgeon: Leonie Man, MD;  Location: [facility];  Service: Cardiovascular; unsuccessful PTCA of distal LAD 100%.  O able to restore only minimal flow with 70% stenosis   LEFT HEART CATH AND CORONARY ANGIOGRAPHY N/A 11/23/2019   Procedure: LEFT HEART CATH AND CORONARY ANGIOGRAPHY;  Surgeon: Leonie Man, MD;  Location: [facility];  Service: Cardiovascular; ANTERIOR STEMI: Widely patent mid to distal LAD stent, distal LAD is 100% stenosed associated with the third diagonal.  (Post PTCA - 70% residual but 100% D3).  RPDA tapers to a small vessel -80% stenosis.  Severely elevated LVEDP   NM MYOVIEW LTD  08/27/2020   (recurrent chest pain): Poor study due to gut attenuation.  EF ~50-55%.  No EKG changes.  MLSTLY FIXED LARGE/SEVRE perfusion defect in the basal-apical inferior, mid inferolateral & mid-apical anterior / apical walls: Findings are c/w known anatomy (apical LAD occlusion & PDA 80%, with gut attenuation enhancing inferior involvement -> but Echo with normal EF & no RWA - contradicts this finding.   TRANSTHORACIC ECHOCARDIOGRAM  11/24/2019   -post anterior STEMI: EF mildly reduced 45 to 50%.  GR 1 DD.  Elevated LAP.  No obvious heart WMA.  Moderate-severe AI with mild-moderate Aortic Sclerosis but no Stenosis.   TRANSTHORACIC ECHOCARDIOGRAM  03/23/2020   (stroke evaluation): EF improved to 55 to 60%.  Significant LVH-notable apical.  Consider cardiac MRI.  Moderate concentric  LVH.  GR 1 DD.  Elevated LAP.  Mild to moderate Aortic Sclerosis but no Stenosis.  Moderate AI.        Inpatient Medications: Scheduled Meds:  amLODipine  5 mg Oral Daily   aspirin  325 mg Oral Once   atorvastatin  80 mg Oral Daily   Bempedoic Acid-Ezetimibe  1 tablet Oral Daily   carvedilol  12.5 mg Oral BID WC   doxepin  50 mg Oral QHS   enoxaparin (LOVENOX) injection  40 mg Subcutaneous Daily   insulin aspart  0-15 Units Subcutaneous TID WC   linaclotide  145 mcg Oral Q breakfast   pantoprazole  40 mg Oral BID   pregabalin  100 mg Oral TID    QUEtiapine  200 mg Oral Daily   QUEtiapine  400 mg Oral QHS   Continuous Infusions:  nitroGLYCERIN 20 mcg/min (10/27/22 0139)   PRN Meds:   Allergies:    Allergies  Allergen Reactions   Bee Venom Anaphylaxis   Iodinated Contrast Media Hives, Itching and Rash   Tomato Itching and Rash    Social History:   Social History   Socioeconomic History   Marital status: Married    Spouse name: Not on file   Number of children: 0   Years of education: 6 th   Highest education level: Not on file  Occupational History   Not on file  Tobacco Use   Smoking status: Every Day    Packs/day: 1.50    Years: 20.00    Additional pack years: 0.00    Total pack years: 30.00    Types: Cigarettes   Smokeless tobacco: Never  Vaping Use   Vaping Use: Never used  Substance and Sexual Activity   Alcohol use: Not Currently   Drug use: Not Currently    Types: "Crack" cocaine    Comment: last used yesterday. States " I do it everyday"    Sexual activity: Not on file  Other Topics Concern   Not on file  Social History Narrative   Right handed    Caffeine - no more than 2 cups    Social Determinants of Health   Financial Resource Strain: Not on file  Food Insecurity: No Food Insecurity (10/27/2022)   Hunger Vital Sign    Worried About Running Out of Food in the Last Year: Never true    Ran Out of Food in the Last Year: Never true  Transportation Needs: No Transportation Needs (10/27/2022)   PRAPARE - Hydrologist (Medical): No    Lack of Transportation (Non-Medical): No  Physical Activity: Not on file  Stress: Not on file  Social Connections: Not on file  Intimate Partner Violence: Not At Risk (10/27/2022)   Humiliation, Afraid, Rape, and Kick questionnaire    Fear of Current or Ex-Partner: No    Emotionally Abused: No    Physically Abused: No    Sexually Abused: No    Family History:   Family History  Problem Relation Age of Onset   Hypertension Mother     Diabetes Mother    Hypertension Father    Diabetes Father    Diabetes Maternal Grandmother    Diabetes Maternal Grandfather      ROS:  Please see the history of present illness.  All other ROS reviewed and negative.     Physical Exam/Data:   Vitals:   10/26/22 2121 10/26/22 2122 10/27/22 0139  BP: (!) 154/85  (!) 156/83  Pulse: 93  88  Resp: 18  18  Temp: 98.5 F (36.9 C)  97.9 F (36.6 C)  TempSrc: Oral  Oral  SpO2: 100%  97%  Weight:  68 kg 69.5 kg  Height:  5\' 2"  (1.575 m) 5\' 2"  (1.575 m)   No intake or output data in the 24 hours ending 10/27/22 0219    10/27/2022    1:39 AM 10/26/2022    9:22 PM 08/06/2022   11:49 AM  Last 3 Weights  Weight (lbs) 153 lb 3.5 oz 150 lb 150 lb  Weight (kg) 69.5 kg 68.04 kg 68.04 kg     Body mass index is 28.02 kg/m.  General:  Well nourished, well developed, in no acute distress HEENT: normal Neck: no JVD Vascular: No carotid bruits; Distal pulses 2+ bilaterally Cardiac:  normal S1, S2; RRR; no murmur  Lungs:  clear to auscultation bilaterally, no wheezing, rhonchi or rales  Abd: soft, nontender, no hepatomegaly  Ext: no edema Musculoskeletal:  No deformities, BUE and BLE strength normal and equal Skin: warm and dry  Neuro:  CNs 2-12 intact, no focal abnormalities noted Psych:  Normal affect   EKG:  The EKG was personally reviewed and demonstrates:  No ST elevation  Relevant CV Studies:  LHC 2021:  Previously placed Mid LAD to Dist LAD stent (unknown type) is widely patent. Mid LAD lesion is 30% stenosed. CULPRIT LESION: Dist LAD-1 lesion is 100% stenosed with 100% stenosed side branch in 3rd Diag. Unsuccessful balloon angioplasty was performed using a BALLOON SAPPHIRE 2.0X12 -unable to advance into the distal vessel. Post intervention, there is a 70% residual stenosis followed by Dist LAD-2 lesion is 99% stenosed. Post intervention, the side branch was remained at 100% residual  stenosis. -------------------------- RPDA lesion is 80% stenosed-tapers into very small vessel.. LV end diastolic pressure is severely elevated. Therefore no the ventriculogram was performed. There is no aortic valve stenosis.    Laboratory Data:  High Sensitivity Troponin:   Recent Labs  Lab 10/26/22 2127 10/27/22 0020  TROPONINIHS 4 4     Chemistry Recent Labs  Lab 10/26/22 2127  NA 137  K 3.5  CL 105  CO2 21*  GLUCOSE 270*  BUN 19  CREATININE 1.36*  CALCIUM 8.6*  GFRNONAA 48*  ANIONGAP 11    Recent Labs  Lab 10/26/22 2127  PROT 7.0  ALBUMIN 3.4*  AST 16  ALT 12  ALKPHOS 64  BILITOT 0.5   Lipids No results for input(s): "CHOL", "TRIG", "HDL", "LABVLDL", "LDLCALC", "CHOLHDL" in the last 168 hours.  Hematology Recent Labs  Lab 10/26/22 2127  WBC 8.0  RBC 3.81*  HGB 10.3*  HCT 32.4*  MCV 85.0  MCH 27.0  MCHC 31.8  RDW 16.9*  PLT 147*   Thyroid No results for input(s): "TSH", "FREET4" in the last 168 hours.  BNP Recent Labs  Lab 10/27/22 0020  BNP 129.3*    DDimer  Recent Labs  Lab 10/27/22 0020  DDIMER 1.44*     Radiology/Studies:  DG Chest 2 View  Result Date: 10/26/2022 CLINICAL DATA:  Chest pain and shortness of breath. EXAM: CHEST - 2 VIEW COMPARISON:  04/21/2022. FINDINGS: The heart size and mediastinal contours are within normal limits. There is atherosclerotic calcification of the aorta. Lung volumes are low with strandy opacities at the lung bases. No effusion or pneumothorax. No acute osseous abnormality. IMPRESSION: Low lung volumes with strandy opacities at the lung bases, possible atelectasis or infiltrate. Electronically Signed  By: Brett Fairy M.D.   On: 10/26/2022 22:01     Assessment and Plan:   # CAD s/p Previous PCI # Chest pain # HFpEF # HTN  -Constant sharp R chest pain that radiates under R breast, worse with movement and breathing x 8 hours.  -EKG and troponin reassuring -Seems like costrochondritis. Could  be spasm as well -Lidocaine patch. Can try NSAIDs as well after stress test -Will stop nitro drip. Start Imdur 60 mg -NPO at MN -Stress test in AM -For HTN; Coreg and Amlodipine -Resume plavix and statin -No need for diuresis- maybe dehydrated infact with small AKI    For questions or updates, please contact [place] Please consult [email or web address] for contact info under    Signed, Jaci Lazier, MD  10/27/2022 2:19 AM

## 2022-10-27 NOTE — Progress Notes (Signed)
Bilateral lower extremity venous study completed.   Preliminary results relayed to RN.  Please see CV Procedures for preliminary results.  Jack Bolio, RVT  11:15 AM 10/27/22

## 2022-10-27 NOTE — Plan of Care (Signed)

## 2022-10-27 NOTE — Progress Notes (Addendum)
FMTS Interim Progress Note  S: Chatted by RN for PRN medications for chest pain. Ordered PRN tylenol 650 mg, lidocaine patch, and nitro SL tablet. Went to bedside. Patient reports same right sided sharp chest pain, worse when she moves or takes a deep breath. Same as presenting chest pain. No diaphoresis, jaw/arm/epigastric pain, dizziness.   O: BP 130/67 (BP Location: Right Arm)   Pulse 83   Temp 98.3 F (36.8 C) (Oral)   Resp 17   Ht 5\' 2"  (1.575 m)   Wt 69.5 kg   SpO2 94%   BMI 28.02 kg/m    Gen: awake, alert, NAD Cardiac: RRR, systolic murmur at right 2nd intercostal space Resp: CTAB anteriorly MSK: No TTP with palpation of chest wall  A/P: Chest pain New episode, same quality and severity as admission. 12-lead and 15-lead (15 lead is 2nd take under "EKG" tab) both without ST elevation/depression or T wave abnormalities. Troponins overnight for same complaint negative x2. CXR yesterday for same complaint unremarkable. Will not repeat CXR or troponins at this time.  - PRN tylenol, lidocaine patch, nitro SL - follow closely  Ezequiel Essex, MD 10/27/2022, 4:46 PM PGY-3, [place] Service pager [phone]

## 2022-10-27 NOTE — Assessment & Plan Note (Addendum)
A1c 11.2 in 11/2021. Home regimen: Glipizide 5mg  BID (taking), Jardiance 25mg  daily (taking), 35u Lantus daily (not taking). -mSSI with CBG checks -start 15u Semglee -QHS coverage

## 2022-10-27 NOTE — Progress Notes (Signed)
   Patient Name: Maria Harper Date of Encounter: 10/27/2022 [place] Cardiologist: Glenetta Hew, MD \\F2   Interval Summary   Seen in consultation earlier this AM.  Chest pain.  This is with certain movements.  Right sided greater than left sided and sharp  Vital Signs   Vitals:   10/26/22 2122 10/27/22 0139 10/27/22 0453 10/27/22 0729  BP:  (!) 156/83 (!) 141/72 119/71  Pulse:  88 85 85  Resp:  18 19 19   Temp:  97.9 F (36.6 C) 98.1 F (36.7 C) (!) 97.4 F (36.3 C)  TempSrc:  Oral Oral Oral  SpO2:  97% 97% 97%  Weight: 68 kg 69.5 kg    Height: 5\' 2"  (1.575 m) 5\' 2"  (1.575 m)      Intake/Output Summary (Last 24 hours) at 10/27/2022 0905 Last data filed at 10/27/2022 0139 Gross per 24 hour  Intake --  Output 350 ml  Net -350 ml      10/27/2022    1:39 AM 10/26/2022    9:22 PM 08/06/2022   11:49 AM  Last 3 Weights  Weight (lbs) 153 lb 3.5 oz 150 lb 150 lb  Weight (kg) 69.5 kg 68.04 kg 68.04 kg      Telemetry/ECG    NSR - Personally Reviewed  Physical Exam  GEN: No acute distress.   Neck: No JVD Cardiac: RRR, no murmurs, rubs, or gallops.  Respiratory: Clear to auscultation bilaterally. GI: Soft, nontender, non-distended  MS: No edema  Assessment & Plan    Chest pain:  Previous history of CAD.  I reviewed her 2021 cath films.  Occluded distal LAD.  This would make perfusion imaging less helpful as there will be at least a moderate area of ischemia/infarct at the LV apex.  Trop is negative.    I am not planning perfusion imaging.  I will wait for the echo.   Not planning cath.  Note the BNP is mildly elevated.  However, she seems to be euvolemic and is on OMT.  No change in therapy other than increase the Imdur (therapeutic dose is really 120 - 240 mg).   For questions or updates, please contact [place] Please consult [email or web address] for contact info under        Signed, Minus Breeding, MD

## 2022-10-27 NOTE — Inpatient Diabetes Management (Signed)
Inpatient Diabetes Program Recommendations  AACE/ADA: New Consensus Statement on Inpatient Glycemic Control (2015)  Target Ranges:  Prepandial:   less than 140 mg/dL      [place] postprandial:   less than 180 mg/dL (1-2 hours)      Critically ill patients:  140 - 180 mg/dL   Lab Results  Component Value Date   GLUCAP 401 (H) 10/27/2022   HGBA1C 9.1 (H) 03/23/2020    Latest Reference Range & Units 10/27/22 06:23  Glucose-Capillary 70 - 99 mg/dL 401 (H) Novolog 15 units  (H): Data is abnormally high  Diabetes history: DM2 Outpatient Diabetes medications: Lantus 35 units qd, Humalog 10 units tid meal coverage, Jardiance 25 units qd, Glucotrol XL 5 mg qd Current orders for Inpatient glycemic control: Novolog 0-15 units tid correction  Inpatient Diabetes Program Recommendations:    Noted Pending A1c  Please consider: -Add carb mod to diet order -Lantus 20 units qd -Novolog 5 units tid meal coverage if eats 50%  Thank you, Bethena Roys E. Kameshia Madruga, RN, MSN, CDE  Diabetes Coordinator Inpatient Glycemic Control Team Team Pager [phone] (8am-5pm) 10/27/2022 10:32 AM

## 2022-10-27 NOTE — Assessment & Plan Note (Addendum)
Improving creatinine. -Avoid nephrotoxic agents -AM BMP -Continue holding Jardiance

## 2022-10-27 NOTE — Assessment & Plan Note (Addendum)
Improved. -Continue home Coreg and Amlodipine

## 2022-10-27 NOTE — Assessment & Plan Note (Deleted)
A1c 11.2 in 11/2021. Compliant on Glipizide 5mg  BID, Jardiance 25mg  daily. -mSSI with CBG checks

## 2022-10-27 NOTE — Progress Notes (Signed)
  Echocardiogram 2D Echocardiogram has been performed.  Maria Harper 10/27/2022, 9:34 AM

## 2022-10-27 NOTE — ED Notes (Signed)
ED TO INPATIENT HANDOFF REPORT  ED Nurse Name and Phone #: Ledell Peoples [phone]  S Name/Age/Gender Maria Harper 52 y.o. female Room/Bed: 006C/006C  Code Status   Code Status: Full Code  Home/SNF/Other Home Patient oriented to: self, place, time, and situation Is this baseline? Yes   Triage Complete: Triage complete  Chief Complaint Chest pain [R07.9]  Triage Note Pt states right chest pain began around 4pm today. Pt states was just sitting when it began hurting. Pt has taken x3 nitro throughout the day with momentary relief. Pt received x1 nitor in route with minimal relief as well. Pt has been off her plavix for 5 days now due to an upcoming colonoscopy.    Allergies Allergies  Allergen Reactions   Bee Venom Anaphylaxis   Iodinated Contrast Media Hives, Itching and Rash   Tomato Itching and Rash    Level of Care/Admitting Diagnosis ED Disposition     ED Disposition  Admit   Condition  --   Comment  Hospital Area: [facility] [place] [[id number]]  Level of Care: Telemetry Cardiac [[id number]]  May place patient in observation at [facility] or [place] if equivalent level of care is available:: No  Covid Evaluation: Asymptomatic - no recent exposure (last 10 days) testing not required  Diagnosis: Chest pain [id number]  Admitting Physician: Marla Roe  Attending Physician: Marla Roe          B Medical/Surgery History Past Medical History:  Diagnosis Date   Acid reflux    Cocaine abuse ([place])    Coronary artery disease, occlusive 2017   2017 or 2018-PCI to LAD (mid-distal); -> anterior STEMI April 2021 -> LAD stent patent, however distal LAD at D3 occluded, only able to reduce to 70% stenosis.  Small caliber RPDA with 80% stenosis.   Diabetes ([place])    Heart attack ([place]) 04/2016   High cholesterol    Hypertension    Ischemic cardiomyopathy 11/2019   Neuropathic pain    Stroke ([place]) 03/2020   Tobacco abuse    Past  Surgical History:  Procedure Laterality Date   CORONARY/GRAFT ACUTE MI REVASCULARIZATION N/A 11/23/2019   Procedure: CORONARY/GRAFT ACUTE MI REVASCULARIZATION;  Surgeon: Leonie Man, MD;  Location: [facility];  Service: Cardiovascular; unsuccessful PTCA of distal LAD 100%.  O able to restore only minimal flow with 70% stenosis   LEFT HEART CATH AND CORONARY ANGIOGRAPHY N/A 11/23/2019   Procedure: LEFT HEART CATH AND CORONARY ANGIOGRAPHY;  Surgeon: Leonie Man, MD;  Location: [facility];  Service: Cardiovascular; ANTERIOR STEMI: Widely patent mid to distal LAD stent, distal LAD is 100% stenosed associated with the third diagonal.  (Post PTCA - 70% residual but 100% D3).  RPDA tapers to a small vessel -80% stenosis.  Severely elevated LVEDP   NM MYOVIEW LTD  08/27/2020   (recurrent chest pain): Poor study due to gut attenuation.  EF ~50-55%.  No EKG changes.  MLSTLY FIXED LARGE/SEVRE perfusion defect in the basal-apical inferior, mid inferolateral & mid-apical anterior / apical walls: Findings are c/w known anatomy (apical LAD occlusion & PDA 80%, with gut attenuation enhancing inferior involvement -> but Echo with normal EF & no RWA - contradicts this finding.   TRANSTHORACIC ECHOCARDIOGRAM  11/24/2019   -post anterior STEMI: EF mildly reduced 45 to 50%.  GR 1 DD.  Elevated LAP.  No obvious heart WMA.  Moderate-severe AI with mild-moderate Aortic Sclerosis but no Stenosis.  TRANSTHORACIC ECHOCARDIOGRAM  03/23/2020   (stroke evaluation): EF improved to 55 to 60%.  Significant LVH-notable apical.  Consider cardiac MRI.  Moderate concentric LVH.  GR 1 DD.  Elevated LAP.  Mild to moderate Aortic Sclerosis but no Stenosis.  Moderate AI.      A IV Location/Drains/Wounds Patient Lines/Drains/Airways Status     Active Line/Drains/Airways     Name Placement date Placement time Site Days   Peripheral IV 10/26/22 Posterior;Right Hand 10/26/22  2142  Hand  1             Intake/Output Last 24 hours No intake or output data in the 24 hours ending 10/27/22 0022  Labs/Imaging Results for orders placed or performed during the hospital encounter of 10/26/22 (from the past 48 hour(s))  CBC with Differential     Status: Abnormal   Collection Time: 10/26/22  9:27 PM  Result Value Ref Range   WBC 8.0 4.0 - 10.5 K/uL   RBC 3.81 (L) 3.87 - 5.11 MIL/uL   Hemoglobin 10.3 (L) 12.0 - 15.0 g/dL   HCT 32.4 (L) 36.0 - 46.0 %   MCV 85.0 80.0 - 100.0 fL   MCH 27.0 26.0 - 34.0 pg   MCHC 31.8 30.0 - 36.0 g/dL   RDW 16.9 (H) 11.5 - 15.5 %   Platelets 147 (L) 150 - 400 K/uL    Comment: REPEATED TO VERIFY   nRBC 0.0 0.0 - 0.2 %   Neutrophils Relative % 63 %   Neutro Abs 4.9 1.7 - 7.7 K/uL   Lymphocytes Relative 27 %   Lymphs Abs 2.2 0.7 - 4.0 K/uL   Monocytes Relative 9 %   Monocytes Absolute 0.8 0.1 - 1.0 K/uL   Eosinophils Relative 1 %   Eosinophils Absolute 0.1 0.0 - 0.5 K/uL   Basophils Relative 0 %   Basophils Absolute 0.0 0.0 - 0.1 K/uL   Immature Granulocytes 0 %   Abs Immature Granulocytes 0.02 0.00 - 0.07 K/uL    Comment: Performed at [facility], [address] [place], [place] [address]  Comprehensive metabolic panel     Status: Abnormal   Collection Time: 10/26/22  9:27 PM  Result Value Ref Range   Sodium 137 135 - 145 mmol/L   Potassium 3.5 3.5 - 5.1 mmol/L   Chloride 105 98 - 111 mmol/L   CO2 21 (L) 22 - 32 mmol/L   Glucose, Bld 270 (H) 70 - 99 mg/dL    Comment: Glucose reference range applies only to samples taken after fasting for at least 8 hours.   BUN 19 6 - 20 mg/dL   Creatinine, Ser 1.36 (H) 0.44 - 1.00 mg/dL   Calcium 8.6 (L) 8.9 - 10.3 mg/dL   Total Protein 7.0 6.5 - 8.1 g/dL   Albumin 3.4 (L) 3.5 - 5.0 g/dL   AST 16 15 - 41 U/L   ALT 12 0 - 44 U/L   Alkaline Phosphatase 64 38 - 126 U/L   Total Bilirubin 0.5 0.3 - 1.2 mg/dL   GFR, Estimated 48 (L) >60 mL/min    Comment: (NOTE) Calculated using the CKD-EPI Creatinine  Equation (2021)    Anion gap 11 5 - 15    Comment: Performed at [facility], [place] [address] [place], [place] [address]  Troponin I (High Sensitivity)     Status: None   Collection Time: 10/26/22  9:27 PM  Result Value Ref Range   Troponin I (High Sensitivity) 4 <  18 ng/L    Comment: (NOTE) Elevated high sensitivity troponin I (hsTnI) values and significant  changes across serial measurements may suggest ACS but many other  chronic and acute conditions are known to elevate hsTnI results.  Refer to the "Links" section for chest pain algorithms and additional  guidance. Performed at [facility], [place] [address] [place], [place] [address]    DG Chest 2 View  Result Date: 10/26/2022 CLINICAL DATA:  Chest pain and shortness of breath. EXAM: CHEST - 2 VIEW COMPARISON:  04/21/2022. FINDINGS: The heart size and mediastinal contours are within normal limits. There is atherosclerotic calcification of the aorta. Lung volumes are low with strandy opacities at the lung bases. No effusion or pneumothorax. No acute osseous abnormality. IMPRESSION: Low lung volumes with strandy opacities at the lung bases, possible atelectasis or infiltrate. Electronically Signed   By: Brett Fairy M.D.   On: 10/26/2022 22:01    Pending Labs Unresulted Labs (From admission, onward)     Start     Ordered   10/27/22 0500  HIV Antibody (routine testing w rflx)  (HIV Antibody (Routine testing w reflex) panel)  Tomorrow morning,   R        10/27/22 0008   10/27/22 [id number]  Basic metabolic panel  Tomorrow morning,   R        10/27/22 0008   10/27/22 0500  CBC  Tomorrow morning,   R        10/27/22 0008   10/27/22 0008  D-dimer, quantitative  Once,   R        10/27/22 0008   10/27/22 0003  Brain natriuretic peptide  Once,   R        10/27/22 0008   Pending  Basic metabolic panel  Once,   R        Pending   Pending  CBC  Once,   R        Pending   Pending  Protime-INR (order if Patient is taking Coumadin /  Warfarin)  Once,   R        Pending            Vitals/Pain Today's Vitals   10/26/22 2121 10/26/22 2122  BP: (!) 154/85   Pulse: 93   Resp: 18   Temp: 98.5 F (36.9 C)   TempSrc: Oral   SpO2: 100%   Weight:  68 kg  Height:  5\' 2"  (1.575 m)    Isolation Precautions No active isolations  Medications Medications  nitroGLYCERIN 50 mg in dextrose 5 % 250 mL (0.2 mg/mL) infusion (15 mcg/min Intravenous Rate/Dose Change 10/26/22 2248)  aspirin tablet 325 mg (325 mg Oral Not Given 10/26/22 2246)  atorvastatin (LIPITOR) tablet 80 mg (has no administration in time range)  Bempedoic Acid-Ezetimibe 180-10 MG TABS 1 tablet (has no administration in time range)  carvedilol (COREG) tablet 12.5 mg (has no administration in time range)  doxepin (SINEQUAN) capsule 50 mg (has no administration in time range)  QUEtiapine (SEROQUEL) tablet 400 mg (has no administration in time range)  QUEtiapine (SEROQUEL) tablet 200 mg (has no administration in time range)  linaclotide (LINZESS) capsule 145 mcg (has no administration in time range)  pantoprazole (PROTONIX) EC tablet 40 mg (has no administration in time range)  pregabalin (LYRICA) capsule 100 mg (has no administration in time range)  amLODipine (NORVASC) tablet 5 mg (has no administration in time range)  enoxaparin (LOVENOX) injection 40 mg (has no administration  in time range)    Mobility walks     Focused Assessments Cardiac Assessment Handoff:    Lab Results  Component Value Date   CKTOTAL 199 09/19/2019   Lab Results  Component Value Date   DDIMER <0.27 11/24/2021   Does the Patient currently have chest pain? Yes    R Recommendations: See Admitting Provider Note  Report given to:   Additional Notes: Ambulates independently

## 2022-10-27 NOTE — Progress Notes (Signed)
     Daily Progress Note Intern Pager: [phone]  Patient name: Maria Harper Medical record number: [id number] Date of birth: 1970-11-18 Age: 52 y.o. Gender: female  Primary Care Provider: Governor Specking, MD Consultants: Cardiology Code Status: Full  Pt Overview and Major Events to Date:  -Admitted 10/26/22  Assessment and Plan:  Maria Harper is a 52 y.o. presenting with atypical chest pain undergoing cardiac work-up. Thus far reassuringly negative. Pertinent PMH/PSH includes HTN, HLD, Tobacco abuse, Hx Cocaine Abuse, HFpEF, CAD, DM2, Hx of CVA, Hx of MI (LAD stent), Depression, Anxiety.   * Chest pain VSS. ACS work-up thus far negative. Cardiology to follow-up ECHO today. D-dimer elevated. CT PE would require desensitization due to allergy. Consider V/Q scan tomorrow. -Cardiology consulted, appreciate recs -Imdur 90mg  AM, 30mg  PM -Echocardiogram -Home Plavix  Type 2 diabetes mellitus with hyperglycemia (HCC) A1c 11.2 in 11/2021. Home regimen: Glipizide 5mg  BID (taking), Jardiance 25mg  daily (taking), 35u Lantus daily (not taking). -mSSI with CBG checks -start 15u [place]  AKI (acute kidney injury) ([place]) Improving creatinine. -Avoid nephrotoxic agents -AM BMP -Continue holding Jardiance  Chronic heart failure with preserved ejection fraction (HFpEF) (HCC) No evidence of exacerbation. Exam appears euvolemic. Plavix restarted. -Imdur 24h 30mg  at night 90mg  in morning -Hold home Jardiance in the setting of AKI -Continue home Coreg -Echocardiogram   Essential hypertension Improved. -Continue home  Coreg and Amlodipine    FEN/GI: Heart healthy diet/ Carb modified PPx: Lovenox Dispo: Pending clinical improvement  Subjective:  NAEO. Reports her breathing has improved. Chest pain is better, and is not currently having pain.  Objective: Temp:  [97.4 F (36.3 C)-98.5 F (36.9 C)] 97.7 F (36.5 C) (03/27 1055) Pulse Rate:  [81-93] 81 (03/27 1055) Resp:   [18-19] 19 (03/27 1055) BP: (94-156)/(71-85) 94/79 (03/27 1055) SpO2:  [95 %-100 %] 95 % (03/27 1055) Weight:  [68 kg-69.5 kg] 69.5 kg (03/27 0139) Physical Exam: General: NAD, lying comfortably on side. ECHO being performed Cardiovascular: RRR, no murmurs, no peripheral edema, no chest wall tenderness Respiratory: normal WOB on RA, CTAB, no crackles Abdomen: soft, NTTP, no rebound or guarding Extremities: Moving all 4 extremities equally   Laboratory: Most recent CBC Lab Results  Component Value Date   WBC 8.6 10/27/2022   HGB 10.5 (L) 10/27/2022   HCT 32.2 (L) 10/27/2022   MCV 83.4 10/27/2022   PLT 143 (L) 10/27/2022   Most recent BMP    Latest Ref Rng & Units 10/27/2022    2:56 AM  BMP  Glucose 70 - 99 mg/dL 291   BUN 6 - 20 mg/dL 19   Creatinine 0.44 - 1.00 mg/dL 1.14   Sodium 135 - 145 mmol/L 136   Potassium 3.5 - 5.1 mmol/L 3.4   Chloride 98 - 111 mmol/L 102   CO2 22 - 32 mmol/L 23   Calcium 8.9 - 10.3 mg/dL 8.9     Other pertinent labs: BNP 129, D-dimer 1.44   Imaging/Diagnostic Tests: None  Salvadore Oxford, MD 10/27/2022, 12:12 PM  PGY-1, [place] Intern pager: [phone], text pages welcome Secure chat group [place]

## 2022-10-28 ENCOUNTER — Other Ambulatory Visit (HOSPITAL_COMMUNITY): Payer: Self-pay

## 2022-10-28 ENCOUNTER — Telehealth: Payer: Self-pay | Admitting: Cardiology

## 2022-10-28 DIAGNOSIS — R072 Precordial pain: Secondary | ICD-10-CM | POA: Diagnosis not present

## 2022-10-28 LAB — GLUCOSE, CAPILLARY
Glucose-Capillary: 390 mg/dL — ABNORMAL HIGH (ref 70–99)
Glucose-Capillary: 122 mg/dL — ABNORMAL HIGH (ref 70–99)

## 2022-10-28 LAB — BASIC METABOLIC PANEL
Anion gap: 11 (ref 5–15)
BUN: 15 mg/dL (ref 6–20)
CO2: 20 mmol/L — ABNORMAL LOW (ref 22–32)
Calcium: 8.8 mg/dL — ABNORMAL LOW (ref 8.9–10.3)
Chloride: 103 mmol/L (ref 98–111)
Creatinine, Ser: 1 mg/dL (ref 0.44–1.00)
GFR, Estimated: >60 mL/min (ref >60)
Glucose, Bld: 368 mg/dL — ABNORMAL HIGH (ref 70–99)
Potassium: 3.7 mmol/L (ref 3.5–5.1)
Sodium: 134 mmol/L — ABNORMAL LOW (ref 135–145)

## 2022-10-28 LAB — HEMOGLOBIN A1C
Hgb A1c MFr Bld: 12.5 % — ABNORMAL HIGH (ref 4.8–5.6)
Mean Plasma Glucose: 312 mg/dL

## 2022-10-28 MED ORDER — ISOSORBIDE MONONITRATE ER 60 MG PO TB24
120.0000 mg | ORAL_TABLET | Freq: Every morning | ORAL | 0 refills | Status: DC
  Filled 2022-10-28 (×3): qty 60, 30d supply, fill #0

## 2022-10-28 MED ORDER — INSULIN ASPART 100 UNIT/ML IJ SOLN
0.0000 [IU] | Freq: Every day | INTRAMUSCULAR | Status: DC
  Administered 2022-10-28: 5 [IU] via SUBCUTANEOUS

## 2022-10-28 MED ORDER — ACETAMINOPHEN 325 MG PO TABS: 650.0000 mg | ORAL_TABLET | Freq: Four times a day (QID) | ORAL | Status: DC | PRN

## 2022-10-28 MED ORDER — INSULIN GLARGINE 100 UNITS/ML SOLOSTAR PEN
15.0000 [IU] | PEN_INJECTOR | Freq: Every day | SUBCUTANEOUS | 11 refills | Status: DC
  Filled 2022-10-28: qty 15, 100d supply, fill #0

## 2022-10-28 MED ORDER — NICOTINE 21 MG/24HR TD PT24
21.0000 mg | MEDICATED_PATCH | Freq: Every day | TRANSDERMAL | 0 refills | Status: DC
  Filled 2022-10-28: qty 28, 28d supply, fill #0

## 2022-10-28 MED ORDER — INSULIN ASPART 100 UNIT/ML IJ SOLN
10.0000 [IU] | Freq: Once | INTRAMUSCULAR | Status: AC
  Administered 2022-10-28: 10 [IU] via SUBCUTANEOUS

## 2022-10-28 NOTE — TOC Transition Note (Addendum)
Transition of Care [facility]) - CM/SW Discharge Note   Patient Details  Name: Maria Harper MRN: [id number] Date of Birth: [birth year]  [facility] CM/SW Contact:  Zenon Mayo, RN Phone Number: 10/28/2022, 11:23 AM   Clinical Narrative:    Patient is for dc today, her ex spouse is here at the bedside, she state will need cab voucher.   She states she does not have any money to get her medications Imdur 15.00 and nicotene patch is 4.00 , she states she has lantus at her ex spouse home in his refrigerator.  NCM assisted her with paying for meds thru [facility].  Cab voucher given, waiver on chart. She states she has no other TOC needs.         Patient Goals and CMS Choice      Discharge Placement                         Discharge Plan and Services Additional resources added to the After Visit Summary for                                       Social Determinants of Health (SDOH) Interventions SDOH Screenings   Food Insecurity: No Food Insecurity (10/27/2022)  Housing: Low Risk  (10/27/2022)  Transportation Needs: No Transportation Needs (10/27/2022)  Utilities: Not At Risk (10/27/2022)  Depression (PHQ2-9): Low Risk  (08/06/2022)  Tobacco Use: High Risk (10/27/2022)     Readmission Risk Interventions     No data to display

## 2022-10-28 NOTE — Progress Notes (Signed)
OT Cancellation Note  Patient Details Name: Maria Harper MRN: [id number] DOB: 1970-12-02   Cancelled Treatment:    Reason Eval/Treat Not Completed: OT screened, no needs identified, will sign off Pt mobilized well with PT this AM and per RN, is independent with ADLs. No formal OT eval needed at this time.  Layla Maw 10/28/2022, 11:21 AM

## 2022-10-28 NOTE — Evaluation (Signed)
Physical Therapy Evaluation Patient Details Name: Maria Harper MRN: [id number] DOB: 08-21-1970 Today's Date: 10/28/2022  History of Present Illness  Pt is a 52 y.o. female who presented 10/26/22 with R-sided chest pain associated with SOB x1 week. Potential costochondritis. CT chest findings are favored as multifocal pneumonia. PMH: CAD with LAD stent, HFpEF, HLD, T2DM, GERD, h/o MI, h/o CVA, HTN, depression and anxiety, cocaine abuse   Clinical Impression  Pt presents with condition above and deficits mentioned below, see PT Problem List. PTA, she was independent with functional mobility without an AD and had recently separated from her husband. Pt reports she is currently homeless and searching for a shelter. Currently, pt displays deficits in lower extremity muscular strength and endurance, balance, and activity tolerance. She was able to ambulate without UE support, LOB, or physical assistance though, displaying a mild lateral drift when ambulating. Pt is anticipated to progress well with more frequent mobility, thus do no anticipate a need for follow-up PT upon d/c. Will continue to follow acutely to maximize her return to baseline prior to d/c.     Recommendations for follow up therapy are one component of a multi-disciplinary discharge planning process, led by the attending physician.  Recommendations may be updated based on patient status, additional functional criteria and insurance authorization.  Follow Up Recommendations       Assistance Recommended at Discharge PRN  Patient can return home with the following  Assist for transportation    Equipment Recommendations None recommended by PT  Recommendations for Other Services       Functional Status Assessment Patient has had a recent decline in their functional status and demonstrates the ability to make significant improvements in function in a reasonable and predictable amount of time.     Precautions / Restrictions  Precautions Precautions: Fall (low) Restrictions Weight Bearing Restrictions: No      Mobility  Bed Mobility Overal bed mobility: Modified Independent             General bed mobility comments: HOB elevated, no assistance needed    Transfers Overall transfer level: Independent Equipment used: None               General transfer comment: Able to come to stand without LOB or assistance    Ambulation/Gait Ambulation/Gait assistance: Min guard, Supervision Gait Distance (Feet): 230 Feet Assistive device: None Gait Pattern/deviations: Step-through pattern, Decreased stride length, Drifts right/left Gait velocity: reduced Gait velocity interpretation: 1.31 - 2.62 ft/sec, indicative of limited community ambulator   General Gait Details: Pt ambulates at slow speed with small steps, guarding chest and slowing speed at times when she would experience pain. Pt drifting mildly with noted weakness in legs but no buckling or overt LOB, min guard-supervision for safety  Stairs            Wheelchair Mobility    Modified Rankin (Stroke Patients Only)       Balance Overall balance assessment: Mild deficits observed, not formally tested                                           Pertinent Vitals/Pain Pain Assessment Pain Assessment: 0-10 Pain Score: 6  Pain Location: R chest Pain Descriptors / Indicators: Discomfort, Grimacing, Guarding, Sharp Pain Intervention(s): Limited activity within patient's tolerance, Monitored during session, Repositioned    Home Living Family/patient expects to be discharged to::  Shelter/Homeless                   Additional Comments: recently separated from husband and is looking for a shelter    Prior Function Prior Level of Function : Independent/Modified Independent             Mobility Comments: No AD       Hand Dominance        Extremity/Trunk Assessment   Upper Extremity  Assessment Upper Extremity Assessment: Defer to OT evaluation    Lower Extremity Assessment Lower Extremity Assessment: Generalized weakness (noted functionally)    Cervical / Trunk Assessment Cervical / Trunk Assessment: Normal  Communication   Communication: No difficulties  Cognition Arousal/Alertness: Awake/alert Behavior During Therapy: WFL for tasks assessed/performed Overall Cognitive Status: Within Functional Limits for tasks assessed                                          General Comments General comments (skin integrity, edema, etc.): VSS on RA; encouraged use of IS, which was provided this session; encouraged Sit <> Stand exercises and ambulating to promote leg strength    Exercises     Assessment/Plan    PT Assessment Patient needs continued PT services  PT Problem List Decreased strength;Decreased activity tolerance;Decreased mobility;Decreased balance;Pain       PT Treatment Interventions DME instruction;Gait training;Stair training;Functional mobility training;Therapeutic activities;Therapeutic exercise;Balance training;Neuromuscular re-education;Patient/family education    PT Goals (Current goals can be found in the Care Plan section)  Acute Rehab PT Goals Patient Stated Goal: to find a shelter PT Goal Formulation: With patient Time For Goal Achievement: 11/11/22 Potential to Achieve Goals: Good    Frequency Min 3X/week     Co-evaluation               AM-PAC PT "6 Clicks" Mobility  Outcome Measure Help needed turning from your back to your side while in a flat bed without using bedrails?: None Help needed moving from lying on your back to sitting on the side of a flat bed without using bedrails?: None Help needed moving to and from a bed to a chair (including a wheelchair)?: None Help needed standing up from a chair using your arms (e.g., wheelchair or bedside chair)?: None Help needed to walk in hospital room?: A  Little Help needed climbing 3-5 steps with a railing? : A Little 6 Click Score: 22    End of Session Equipment Utilized During Treatment: Gait belt Activity Tolerance: Patient tolerated treatment well Patient left: in chair;with call bell/phone within reach;with chair alarm set   PT Visit Diagnosis: Unsteadiness on feet (R26.81);Other abnormalities of gait and mobility (R26.89);Muscle weakness (generalized) (M62.81);Pain Pain - Right/Left: Right Pain - part of body:  (chest)    Time: [id number] PT Time Calculation (min) (ACUTE ONLY): 18 min   Charges:   PT Evaluation $PT Eval Low Complexity: 1 Low          Moishe Spice, PT, DPT Acute Rehabilitation Services  Office: [phone]   Orvan Falconer 10/28/2022, 9:05 AM

## 2022-10-28 NOTE — Discharge Summary (Signed)
[facility] Discharge Summary  Patient name: Maria Harper Medical record number: [id number] Date of birth: 09-12-1970 Age: 52 y.o. Gender: female Date of Admission: 10/26/2022  Date of Discharge: 10/28/22 Admitting Physician: Kinnie Feil, MD  Primary Care Provider: Governor Specking, MD Consultants: Cardiology  Indication for Hospitalization: concern for MI  Discharge Diagnoses/Problem List:  Principal Problem for Admission: chest pain Other Problems addressed during stay:  Principal Problem:   Chest pain Active Problems:   AKI (acute kidney injury) ([place])   Type 2 diabetes mellitus with hyperglycemia ([place])   Chronic heart failure with preserved ejection fraction (HFpEF) ([place])   Hyperlipidemia associated with type 2 diabetes mellitus [facility])   Essential hypertension    Brief Hospital Course:  Maria Harper is a 52 y.o.female with a history of CAD with LAD stent, HFpEF, HLD, T2DM, GERD, h/o MI, h/o CVA, HTN, depression and anxiety who was admitted to the [facility] Medicine Teaching Service at [facility] for chest pain. Her hospital course is detailed below:  Chest pain HFpEF Admitted with constant sharp R chest pain that radiates under R breast, worse with movement and breathing. EKG without ST changes and troponins negative, low concern for ACS. Started on Nitro drip and Cardiology was consulted. Initially planned on cardiac stress test but likely low yield due to h/o LAD occlusion. CT chest to r/o aortic dissection negative for aortic changes, but did show bilateral pulmonary infiltrates consistent with pneumonia. As no clinical symptoms of pneumonia, patient was not started on antibiotics.  Echo unremarkable for acute change. UDS + for cocaine suggesting cause of chest pain but possible false positive. Cardiology recommended increasing her Imdur to 120 mg once daily. Discharged in stable condition, with increased dose of Imdur.  AKI Cr elevated to  1.36, baseline ~0.9. Slowly normalized to baseline of ~1.0 by the time of discharge.   Other chronic conditions were medically managed with home medications and formulary alternatives as necessary (T2DM)  PCP Follow-up Recommendations:  Substance use resources - cocaine use. Follow up on blood sugars to titrate insulin, discharged on 15 units glargine and restarted on glipizide and jardiance.  Recheck BMP to monitor kidney function  Repeat chest imaging to follow up on pulmonary infiltrates, concerning for malignancy with history.  Needs lung cancer screening at age 57.   Disposition: home  Discharge Condition: stable   Discharge Exam:  Vitals:   10/28/22 0325 10/28/22 0736  BP: 117/73 113/74  Pulse: 71 72  Resp: 14 15  Temp: 97.8 F (36.6 C) (!) 97.3 F (36.3 C)  SpO2: 98% 98%   General: NAD, working with PT Cardiovascular: RRR, no murmurs, no peripheral edema Respiratory: normal WOB on RA, CTAB, no wheezes, ronchi or rales Abdomen: soft, NTTP, no rebound or guarding Extremities: Moving all 4 extremities equally   Significant Procedures: None  Significant Labs and Imaging:  Recent Labs  Lab 10/26/22 2127 10/27/22 0256  WBC 8.0 8.6  HGB 10.3* 10.5*  HCT 32.4* 32.2*  PLT 147* 143*   Recent Labs  Lab 10/26/22 2127 10/27/22 0256 10/27/22 2358  NA 137 136 134*  K 3.5 3.4* 3.7  CL 105 102 103  CO2 21* 23 20*  GLUCOSE 270* 291* 368*  BUN 19 19 15   CREATININE 1.36* 1.14* 1.00  CALCIUM 8.6* 8.9 8.8*  ALKPHOS 64  --   --   AST 16  --   --   ALT 12  --   --  ALBUMIN 3.4*  --   --    CT Chest w/o Contrast 10/28/22 IMPRESSION: 1. Patchy ground-glass and airspace opacities in the bilateral lower lobes and minimally in the inferior left upper lobe and right middle lobe. Findings are favored as multifocal pneumonia. Recommend follow-up imaging to resolution. 2. No evidence for aortic aneurysm.  ECHO 10/28/22 IMPRESSIONS   1. Anuerysmal apex with normal  wall motion in other myocardial walls.  Left ventricular ejection fraction, by estimation, is 55 to 60%. The left  ventricle has normal function. The left ventricle has no regional wall  motion abnormalities. There is mild  concentric left ventricular hypertrophy. Left ventricular diastolic  parameters are indeterminate.   2. Right ventricular systolic function is normal. The right ventricular  size is normal. Tricuspid regurgitation signal is inadequate for assessing  PA pressure.   3. The mitral valve is normal in structure. No evidence of mitral valve  regurgitation. No evidence of mitral stenosis.   4. The aortic valve is normal in structure. Aortic valve regurgitation is  mild to moderate. Aortic valve sclerosis/calcification is present, without  any evidence of aortic stenosis. Aortic regurgitation PHT measures 412  msec.   5. The inferior vena cava is normal in size with greater than 50%  respiratory variability, suggesting right atrial pressure of 3 mmHg.   Results/Tests Pending at Time of Discharge: none  Discharge Medications:  Allergies as of 10/28/2022       Reactions   Bee Venom Anaphylaxis   Iodinated Contrast Media Hives, Itching, Rash   Tomato Itching, Rash        Medication List     STOP taking these medications    FLUoxetine 20 MG capsule Commonly known as: PROZAC   hydrOXYzine 25 MG tablet Commonly known as: ATARAX       TAKE these medications    Accu-Chek Guide test strip Generic drug: glucose blood USE to check blood sugar TWICE DAILY. in THE morning FOR fasting AND 2 hours AFTER a meal   acetaminophen 325 MG tablet Commonly known as: TYLENOL Take 2 tablets (650 mg total) by mouth every 6 (six) hours as needed for mild pain.   amLODipine 5 MG tablet Commonly known as: NORVASC Take 1 tablet (5 mg total) by mouth daily. What changed: how much to take   atorvastatin 80 MG tablet Commonly known as: LIPITOR Take 1 tablet (80 mg total) by  mouth daily.   carvedilol 12.5 MG tablet Commonly known as: COREG Take 1 tablet (12.5 mg total) by mouth 2 (two) times daily with a meal. What changed: how much to take   Claritin 10 MG tablet Generic drug: loratadine Take 10 mg by mouth daily.   clopidogrel 75 MG tablet Commonly known as: PLAVIX Take 75 mg by mouth daily.   cyclobenzaprine 10 MG tablet Commonly known as: FLEXERIL Take 10 mg by mouth daily as needed for muscle spasms.   doxepin 50 MG capsule Commonly known as: SINEQUAN Take 150 mg by mouth at bedtime.   gabapentin 300 MG capsule Commonly known as: NEURONTIN Take 300 mg by mouth 3 (three) times daily.   glipiZIDE 5 MG 24 hr tablet Commonly known as: GLUCOTROL XL Take 5 mg by mouth in the morning and at bedtime.   insulin glargine 100 unit/mL Sopn Commonly known as: LANTUS Inject 15 Units into the skin at bedtime. What changed: how much to take   ipratropium 17 MCG/ACT inhaler Commonly known as: ATROVENT HFA Inhale 2 puffs into  the lungs daily as needed for wheezing.   isosorbide mononitrate 60 MG 24 hr tablet Commonly known as: IMDUR Take 2 tablets (120 mg total) by mouth in the morning. What changed:  how much to take when to take this   Jardiance 25 MG Tabs tablet Generic drug: empagliflozin Take 25 mg by mouth daily.   Linzess 145 MCG Caps capsule Generic drug: linaclotide Take 145 mcg by mouth daily.   Nexlizet 180-10 MG Tabs Generic drug: Bempedoic Acid-Ezetimibe Take 1 tablet by mouth daily.   nicotine 21 mg/24hr patch Commonly known as: NICODERM CQ - dosed in mg/24 hours Place 1 patch (21 mg total) onto the skin daily.   nitroGLYCERIN 0.4 MG SL tablet Commonly known as: NITROSTAT DISSOLVE 1 TABLET UNDER THE TONGUE EVERY 5 MINUTES AS NEEDED FOR CHEST PAIN. DO NOT EXCEED A TOTAL OF 3 DOSES IN 15 MINUTES. CALL 911 IF NO RELIEF. What changed: See the new instructions.   pantoprazole 40 MG tablet Commonly known as: PROTONIX Take  1 tablet (40 mg total) by mouth 2 (two) times daily. KEEP OV.   pregabalin 100 MG capsule Commonly known as: LYRICA Take 1 capsule (100 mg total) by mouth 3 (three) times daily.   QUEtiapine 400 MG tablet Commonly known as: SEROQUEL Take 400 mg by mouth at bedtime.   SEROquel 200 MG tablet Generic drug: QUEtiapine Take 200 mg by mouth in the morning.        Discharge Instructions: Please refer to Patient Instructions section of EMR for full details.  Patient was counseled important signs and symptoms that should prompt return to medical care, changes in medications, dietary instructions, activity restrictions, and follow up appointments.   Follow-Up Appointments:  Follow-up Information     Governor Specking, MD. Schedule an appointment as soon as possible for a visit in 1 week(s).   Specialty: General Practice Contact information: [address] [place] [address] [phone]                 Salvadore Oxford, MD 10/28/2022, 11:21 AM PGY-1, [place]

## 2022-10-28 NOTE — Telephone Encounter (Signed)
Patient picked up new Rx for isosorbide mononitrate 60 mg 2 tabs daily from [facility]. She does not need a new Rx for the medication change.  I confirmed with pharmacy before calling patient that new Rx was sent and filled. It was confirmed that patient picked up Rx.

## 2022-10-28 NOTE — Progress Notes (Signed)
   Patient Name: Maria Harper Date of Encounter: 10/28/2022 [place] Cardiologist: Glenetta Hew, MD \\F2   Interval Summary   She still reports 6/10 chest pain.  She reports that this is positional and with certain movements.   Vital Signs   Vitals:   10/27/22 2003 10/27/22 2343 10/28/22 0325 10/28/22 0736  BP: (!) 141/73 (!) 167/76 117/73 113/74  Pulse: 82 81 71 72  Resp: 18 13 14 15   Temp: 98.3 F (36.8 C) 98.2 F (36.8 C) 97.8 F (36.6 C) (!) 97.3 F (36.3 C)  TempSrc: Oral Oral Oral Oral  SpO2: 97% 93% 98% 98%  Weight:      Height:        Intake/Output Summary (Last 24 hours) at 10/28/2022 0806 Last data filed at 10/28/2022 0737 Gross per 24 hour  Intake 756.27 ml  Output 1000 ml  Net -243.73 ml      10/27/2022    1:39 AM 10/26/2022    9:22 PM 08/06/2022   11:49 AM  Last 3 Weights  Weight (lbs) 153 lb 3.5 oz 150 lb 150 lb  Weight (kg) 69.5 kg 68.04 kg 68.04 kg      Telemetry/ECG    NSR - Personally Reviewed  Physical Exam   GEN: No  acute distress.   Neck: No  JVD Cardiac: RRR, no murmurs, rubs, or gallops.  Respiratory: Clear   to auscultation bilaterally. GI: Soft, nontender, non-distended, normal bowel sounds  MS:  No edema; No deformity.   Assessment & Plan    Chest pain:  Previous history of CAD.  I reviewed her 2021 cath films.  Occluded distal LAD.  Echo yesterday consistent with previous apical infarct and not significantly different than previous echo in 2021.   Trop is negative.    No further cardiac imaging is planned.  Imdur increased.    Cardiomyopathy:  Overall EF is preserved.  Apex is akinetic but not dyskinetic.  This is long standing.  No clear indication for warfarin in this situation.  Appearance is consistent with previous echo and known CAD.   Continue OMT as on MAR.    BP has been labile.  I don't feel strongly about ARB or ARNI in this situation.     AI:  Mild to moderate.  Follow clinically.     SOB:  Per  primary team.  CT suggested pneumonia.    For questions or updates, please contact [place] Please consult [email or web address] for contact info under        Signed, Minus Breeding, MD

## 2022-10-28 NOTE — Discharge Instructions (Signed)
Dear Maria Harper,  Thank you for letting us participate in your care. You were hospitalized for chest pain and diagnosed with cocaine induced chest pain. You underwent an extensive work-up to rule out a heart attack which was negative.  POST-HOSPITAL & CARE INSTRUCTIONS If you have crushing chest pain with sweating, please return to care. The best way to treat your pain is stop cocaine use. Please do not hesitate to ask for resources for quitting if you would like them. Go to your follow up appointments (listed below)   DOCTOR'S APPOINTMENT   Future Appointments  Date Time Provider [place]  11/29/2022  9:30 AM Leonie Man, MD [facility] None  01/14/2023  9:40 AM [facility] DIAG [place] 1 [facility]  01/14/2023  9:50 AM GI[facility] [place] 1 GI-[facility]     Take care and be well!  [facility]  [place], [place] [address] [phone]

## 2022-10-28 NOTE — Telephone Encounter (Signed)
Pt c/o medication issue:  1. Name of Medication:   isosorbide mononitrate (IMDUR) 60 MG 24 hr tablet   2. How are you currently taking this medication (dosage and times per day)?   As prescribed  3. Are you having a reaction (difficulty breathing--STAT)?   No  4. What is your medication issue?   Patient stated she was discharged from the hospital today and the dosage for her medication was increased from 1 tablet a day  to 2 tablets (120 mg) per day.  Patient stated she needs a new prescription sent to her pharmacy ([facility] [address] [facility] [place]  Patient wants to know if she should start taking the new dosage.

## 2022-10-28 NOTE — Hospital Course (Addendum)
Maria Harper is a 52 y.o.female with a history of CAD with LAD stent, HFpEF, HLD, T2DM, GERD, h/o MI, h/o CVA, HTN, depression and anxiety who was admitted to the [facility] Medicine Teaching Service at [facility] for chest pain. Her hospital course is detailed below:  Chest pain HFpEF Admitted with constant sharp R chest pain that radiates under R breast, worse with movement and breathing. EKG without ST changes and troponins negative, low concern for ACS. Started on Nitro drip and Cardiology was consulted. Initially planned on cardiac stress test but likely low yield due to h/o LAD occlusion. CT chest to r/o aortic dissection negative for aortic changes, but did show bilateral pulmonary infiltrates consistent with pneumonia. As no clinical symptoms of pneumonia, patient was not started on antibiotics.  Echo unremarkable for acute change. UDS + for cocaine suggesting cause of chest pain but possible false positive. Cardiology recommended increasing her Imdur to 120 mg once daily. Discharged in stable condition, with increased dose of Imdur.  AKI Cr elevated to 1.36, baseline ~0.9. Slowly normalized to baseline of ~1.0 by the time of discharge.   Other chronic conditions were medically managed with home medications and formulary alternatives as necessary (T2DM)  PCP Follow-up Recommendations:  Substance use resources - cocaine use. Follow up on blood sugars to titrate insulin, discharged on 15 units glargine and restarted on glipizide and jardiance.  Recheck BMP to monitor kidney function  Repeat chest imaging to follow up on pulmonary infiltrates, concerning for malignancy with history.  Needs lung cancer screening at age 3.

## 2022-11-11 ENCOUNTER — Telehealth: Payer: Self-pay | Admitting: Cardiology

## 2022-11-11 ENCOUNTER — Other Ambulatory Visit: Payer: Self-pay

## 2022-11-11 MED ORDER — ISOSORBIDE MONONITRATE ER 60 MG PO TB24: 120.0000 mg | ORAL_TABLET | Freq: Every morning | ORAL | 1 refills | Status: DC

## 2022-11-11 NOTE — Telephone Encounter (Signed)
RX was increased to 120 mg per last hospital visit.   I sent over updated RX to verified pharmacy.   Thanks!

## 2022-11-11 NOTE — Telephone Encounter (Signed)
Pt c/o medication issue:  1. Name of Medication: isosorbide mononitrate (IMDUR) 60 MG 24 hr tablet   2. How are you currently taking this medication (dosage and times per day)?   3. Are you having a reaction (difficulty breathing--STAT)?   4. What is your medication issue?  Pt states this rx was changed to 290mg  and needs it sent over to the pharmacy.

## 2022-11-29 ENCOUNTER — Ambulatory Visit: Payer: Medicaid Other | Admitting: Cardiology

## 2022-12-01 ENCOUNTER — Other Ambulatory Visit: Payer: Self-pay | Admitting: Cardiology

## 2022-12-03 ENCOUNTER — Telehealth: Payer: Self-pay | Admitting: *Deleted

## 2022-12-03 NOTE — Telephone Encounter (Signed)
LMTCB and schedule f/u appt.  Patient needs appt before Lyrica can be refilled.

## 2022-12-03 NOTE — Telephone Encounter (Signed)
Patient requesting refill on Lyrica. Per last office visit no f/u recommended.

## 2022-12-16 ENCOUNTER — Ambulatory Visit: Payer: Medicaid Other | Admitting: Physical Medicine & Rehabilitation

## 2022-12-24 ENCOUNTER — Encounter: Payer: Medicaid Other | Attending: Physical Medicine & Rehabilitation | Admitting: Physical Medicine & Rehabilitation

## 2022-12-24 ENCOUNTER — Encounter: Payer: Self-pay | Admitting: Physical Medicine & Rehabilitation

## 2022-12-24 VITALS — BP 115/72 | HR 77 | Ht 62.0 in | Wt 145.0 lb

## 2022-12-24 DIAGNOSIS — G890 Central pain syndrome: Secondary | ICD-10-CM

## 2022-12-24 MED ORDER — PREGABALIN 300 MG PO CAPS: 300.0000 mg | ORAL_CAPSULE | Freq: Two times a day (BID) | ORAL | 5 refills | Status: DC

## 2022-12-24 NOTE — Patient Instructions (Signed)
Stop gabapentin whenever you start tiaking Lyrica twice a day

## 2022-12-24 NOTE — Progress Notes (Signed)
Subjective:    Patient ID: Maria Harper, female    DOB: 1971-07-23, 52 y.o.   MRN: [id number]  HPI 52 yo female with left thalamic infarct on 03/23/2020.  She had no significant weakness however she has had persistent numbness on the right hemibody. Righthemibody burning pain takes 300mg  of lyrica in am , no drowsiness  Still takes Gabapentin 300mg  3 times a day which by itself was not helpful.  With the Lyrica 300 mg she is getting some good relief at least in the morning time.  She denies any side effects with the medication such as drowsiness. Pain Inventory Average Pain 9 Pain Right Now 9 My pain is constant, sharp, burning, stabbing, and tingling  In the last 24 hours, has pain interfered with the following? General activity 10 Relation with others 0 Enjoyment of life 10 What TIME of day is your pain at its worst? morning , daytime, evening, and night Sleep (in general) Fair  Pain is worse with: walking, bending, sitting, standing, and some activites Pain improves with: medication Relief from Meds: 5  Family History  Problem Relation Age of Onset   Hypertension Mother    Diabetes Mother    Hypertension Father    Diabetes Father    Diabetes Maternal Grandmother    Diabetes Maternal Grandfather    Social History   Socioeconomic History   Marital status: Married    Spouse name: Not on file   Number of children: 0   Years of education: 6 th   Highest education level: Not on file  Occupational History   Not on file  Tobacco Use   Smoking status: Every Day    Packs/day: 1.50    Years: 20.00    Additional pack years: 0.00    Total pack years: 30.00    Types: Cigarettes   Smokeless tobacco: Never  Vaping Use   Vaping Use: Never used  Substance and Sexual Activity   Alcohol use: Not Currently   Drug use: Not Currently    Types: "Crack" cocaine    Comment: last used yesterday. States " I do it everyday"    Sexual activity: Not on file  Other Topics  Concern   Not on file  Social History Narrative   Right handed    Caffeine - no more than 2 cups    Social Determinants of Health   Financial Resource Strain: Not on file  Food Insecurity: No Food Insecurity (10/27/2022)   Hunger Vital Sign    Worried About Running Out of Food in the Last Year: Never true    Ran Out of Food in the Last Year: Never true  Transportation Needs: No Transportation Needs (10/27/2022)   PRAPARE - Administrator, Civil Service (Medical): No    Lack of Transportation (Non-Medical): No  Physical Activity: Not on file  Stress: Not on file  Social Connections: Not on file   Past Surgical History:  Procedure Laterality Date   CORONARY/GRAFT ACUTE MI REVASCULARIZATION N/A 11/23/2019   Procedure: CORONARY/GRAFT ACUTE MI REVASCULARIZATION;  Surgeon: [name], MD;  Location: [facility] INVASIVE CV LAB;  Service: Cardiovascular; unsuccessful PTCA of distal LAD 100%.  O able to restore only minimal flow with 70% stenosis   LEFT HEART CATH AND CORONARY ANGIOGRAPHY N/A 11/23/2019   Procedure: LEFT HEART CATH AND CORONARY ANGIOGRAPHY;  Surgeon: [name], MD;  Location[facility] INVASIVE CV LAB;  Service: Cardiovascular; ANTERIOR STEMI: Widely patent mid to distal LAD  stent, distal LAD is 100% stenosed associated with the third diagonal.  (Post PTCA - 70% residual but 100% D3).  RPDA tapers to a small vessel -80% stenosis.  Severely elevated LVEDP   NM MYOVIEW LTD  08/27/2020   (recurrent chest pain): Poor study due to gut attenuation.  EF ~50-55%.  No EKG changes.  MLSTLY FIXED LARGE/SEVRE perfusion defect in the basal-apical inferior, mid inferolateral & mid-apical anterior / apical walls: Findings are c/w known anatomy (apical LAD occlusion & PDA 80%, with gut attenuation enhancing inferior involvement -> but Echo with normal EF & no RWA - contradicts this finding.   TRANSTHORACIC ECHOCARDIOGRAM  11/24/2019   -post anterior STEMI: EF mildly reduced 45 to 50%.   GR 1 DD.  Elevated LAP.  No obvious heart WMA.  Moderate-severe AI with mild-moderate Aortic Sclerosis but no Stenosis.   TRANSTHORACIC ECHOCARDIOGRAM  03/23/2020   (stroke evaluation): EF improved to 55 to 60%.  Significant LVH-notable apical.  Consider cardiac MRI.  Moderate concentric LVH.  GR 1 DD.  Elevated LAP.  Mild to moderate Aortic Sclerosis but no Stenosis.  Moderate AI.    Past Surgical History:  Procedure Laterality Date   CORONARY/GRAFT ACUTE MI REVASCULARIZATION N/A 11/23/2019   Procedure: CORONARY/GRAFT ACUTE MI REVASCULARIZATION;  Surgeon: [name], MD;  Location: [facility] INVASIVE CV LAB;  Service: Cardiovascular; unsuccessful PTCA of distal LAD 100%.  O able to restore only minimal flow with 70% stenosis   LEFT HEART CATH AND CORONARY ANGIOGRAPHY N/A 11/23/2019   Procedure: LEFT HEART CATH AND CORONARY ANGIOGRAPHY;  Surgeon: [name], MD;  Location: [facility] INVASIVE CV LAB;  Service: Cardiovascular; ANTERIOR STEMI: Widely patent mid to distal LAD stent, distal LAD is 100% stenosed associated with the third diagonal.  (Post PTCA - 70% residual but 100% D3).  RPDA tapers to a small vessel -80% stenosis.  Severely elevated LVEDP   NM MYOVIEW LTD  08/27/2020   (recurrent chest pain): Poor study due to gut attenuation.  EF ~50-55%.  No EKG changes.  MLSTLY FIXED LARGE/SEVRE perfusion defect in the basal-apical inferior, mid inferolateral & mid-apical anterior / apical walls: Findings are c/w known anatomy (apical LAD occlusion & PDA 80%, with gut attenuation enhancing inferior involvement -> but Echo with normal EF & no RWA - contradicts this finding.   TRANSTHORACIC ECHOCARDIOGRAM  11/24/2019   -post anterior STEMI: EF mildly reduced 45 to 50%.  GR 1 DD.  Elevated LAP.  No obvious heart WMA.  Moderate-severe AI with mild-moderate Aortic Sclerosis but no Stenosis.   TRANSTHORACIC ECHOCARDIOGRAM  03/23/2020   (stroke evaluation): EF improved to 55 to 60%.  Significant LVH-notable  apical.  Consider cardiac MRI.  Moderate concentric LVH.  GR 1 DD.  Elevated LAP.  Mild to moderate Aortic Sclerosis but no Stenosis.  Moderate AI.    Past Medical History:  Diagnosis Date   Acid reflux    Cocaine abuse (HCC)    Coronary artery disease, occlusive 2017   2017 or 2018-PCI to LAD (mid-distal); -> anterior STEMI April 2021 -> LAD stent patent, however distal LAD at D3 occluded, only able to reduce to 70% stenosis.  Small caliber RPDA with 80% stenosis.   Diabetes (HCC)    Heart attack (HCC) 04/2016   High cholesterol    Hypertension    Ischemic cardiomyopathy 11/2019   Neuropathic pain    Stroke (HCC) 03/2020   Tobacco abuse    BP 115/72   Pulse 77   Ht  5\' 2"  (1.575 m)   Wt 145 lb (65.8 kg)   SpO2 98%   BMI 26.52 kg/m   Opioid Risk Score:   Fall Risk Score:  `1  Depression screen [facility] 2/9     12/24/2022    9:32 AM 08/06/2022   11:54 AM  Depression screen PHQ 2/9  Decreased Interest 0 1  Down, Depressed, Hopeless 0 1  PHQ - 2 Score 0 2    Review of Systems  Musculoskeletal:  Positive for back pain.       All Pain on the right side of the body, foot, shoulder, arm, hand, leg down to foot  All other systems reviewed and are negative.      Objective:   Physical Exam  Motor strength is 5/5 bilateral deltoid, bicep, tricep, grip, hip flexor, knee extensor ankle dorsiflexor and plantar flexor Ambulates without assistive device no evidence of toe drag or knee instability She has reduced sensation right side of face right upper extremity and right lower extremity although she is able to identify fingers touch.  She also has diminished hot cold sensation on the right side. Allodynia with movement, no hyperpathia      Assessment & Plan:  #1.  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  Will increase Lyrica to 300 mg twice daily instructed patient to stop gabapentin once she has taken 2 Lyrica a day. Follow-up in 3 months PM&R  clinic

## 2022-12-29 ENCOUNTER — Telehealth: Payer: Self-pay | Admitting: *Deleted

## 2022-12-29 MED ORDER — PREGABALIN 300 MG PO CAPS: 300.0000 mg | ORAL_CAPSULE | Freq: Two times a day (BID) | ORAL | 5 refills | Status: DC

## 2022-12-29 NOTE — Telephone Encounter (Signed)
Resend Pregabalin to pharmacy please.

## 2022-12-29 NOTE — Telephone Encounter (Signed)
PMP was Reviewed.  Pregabalin e-scribed to pharmacy.

## 2022-12-29 NOTE — Telephone Encounter (Signed)
Patient has issue with Lyrica rx sent last week [facility] [phone].

## 2023-01-03 ENCOUNTER — Telehealth: Payer: Self-pay | Admitting: Registered Nurse

## 2023-01-03 NOTE — Telephone Encounter (Signed)
Maria Harper was seen by Dr Wynn Banker on 12/24/2022, she was instructed to take the Lyrica only.  Pharmacy was called today, pharmacist stated they had already dispense the Lyrica and They called and spoke with Ms. Briguglio not to take the gabapentin.  Call placed to Ms. Ardizzone regarding the above, no answer. Left message to return the call.

## 2023-01-04 NOTE — Progress Notes (Deleted)
Cardiology Office Note:    Date:  01/04/2023   ID:  Maria Harper, DOB [birth year], MRN [id number]  PCP:  Earnest Rosier, MD [facility] Cardiologist: Bryan Lemma, MD   Reason for visit: 18-month follow-up  History of Present Illness:    Maria Harper is a 52 y.o. female with a hx of hyperlipidemia, coronary artery disease status post LAD stent, chronic diastolic heart failure, tobacco use, diabetes, stroke.  Patient was last seen by Dr. Herbie Baltimore in September 2023.  Patient was seen after ED visit for chest pain which was thought more likely due to costochondritis/spasm.  She was continued on Imdur 90 mg in the morning and 30 mg in the evening.  Amlodipine 5 mg was added.  Coreg was reduced to 12.5 mg twice daily to allow for blood pressure.  Patient was euvolemic not requiring a diuretic.  He noted jump in her A1c as well as her cholesterol numbers.  She went to the hospital on March 2024 with sharp chest pain worse with movement and breathing.  Echo unremarkable.  UDS positive for cocaine.  Cardiology recommended increased her Imdur to 120 mg daily.  *May add Ranexa per Harding's last note  Today, ***  Precordial pain -***  Coronary artery disease -2017 or 2018-PCI to LAD (mid-distal); -> anterior STEMI April 2021 -> LAD stent patent, however distal LAD at D3 occluded, only able to reduce to 70% stenosis. Small caliber RPDA with 80% stenosis.  -Myoview in January 2022 showed large fixed LAD distribution infarct -Patient is on Plavix monotherapy.  Okay to hold for procedures 5 to 7 days preop. -***  Chronic diastolic heart failure -***   Hypertension -*** -Goal BP is <130/80.  Recommend DASH diet (high in vegetables, fruits, low-fat dairy products, whole grains, poultry, fish, and nuts and low in sweets, sugar-sweetened beverages, and red meats), salt restriction and increase physical activity.  Hyperlipidemia -*** -Discussed cholesterol lowering diets -  Mediterranean diet, DASH diet, vegetarian diet, low-carbohydrate diet and avoidance of trans fats.  Discussed healthier choice substitutes.  Nuts, high-fiber foods, and fiber supplements may also improve lipids.    Obesity -Discussed how even a 5-10% weight loss can have cardiovascular benefits.   -Recommend moderate intensity activity for 30 minutes 5 days/week and the DASH diet.  Tobacco use  -Recommend tobacco cessation.  Reviewed physiologic effects of nicotine and the immediate-eventual benefits of quitting including improvement in cough/breathing and reduction in cardiovascular events.  Discussed quitting tips such as removing triggers and getting support from family/friends and Quitline [place]. -USPSTF recommends one-time screening for abdominal aortic aneurysm (AAA) by ultrasound in men 68 -67 years old who have ever smoked.      Disposition - Follow-up in ***     Past Medical History:  Diagnosis Date   Acid reflux    Cocaine abuse (HCC)    Coronary artery disease, occlusive 2017   2017 or 2018-PCI to LAD (mid-distal); -> anterior STEMI April 2021 -> LAD stent patent, however distal LAD at D3 occluded, only able to reduce to 70% stenosis.  Small caliber RPDA with 80% stenosis.   Diabetes (HCC)    Heart attack (HCC) 04/2016   High cholesterol    Hypertension    Ischemic cardiomyopathy 11/2019   Neuropathic pain    Stroke (HCC) 03/2020   Tobacco abuse     Past Surgical History:  Procedure Laterality Date   CORONARY/GRAFT ACUTE MI REVASCULARIZATION N/A 11/23/2019   Procedure: CORONARY/GRAFT ACUTE MI REVASCULARIZATION;  Surgeon: [name], MD;  Location: [facility] INVASIVE CV LAB;  Service: Cardiovascular; unsuccessful PTCA of distal LAD 100%.  O able to restore only minimal flow with 70% stenosis   LEFT HEART CATH AND CORONARY ANGIOGRAPHY N/A 11/23/2019   Procedure: LEFT HEART CATH AND CORONARY ANGIOGRAPHY;  Surgeon: [name], MD;  Location: [facility] INVASIVE CV LAB;  Service:  Cardiovascular; ANTERIOR STEMI: Widely patent mid to distal LAD stent, distal LAD is 100% stenosed associated with the third diagonal.  (Post PTCA - 70% residual but 100% D3).  RPDA tapers to a small vessel -80% stenosis.  Severely elevated LVEDP   NM MYOVIEW LTD  08/27/2020   (recurrent chest pain): Poor study due to gut attenuation.  EF ~50-55%.  No EKG changes.  MLSTLY FIXED LARGE/SEVRE perfusion defect in the basal-apical inferior, mid inferolateral & mid-apical anterior / apical walls: Findings are c/w known anatomy (apical LAD occlusion & PDA 80%, with gut attenuation enhancing inferior involvement -> but Echo with normal EF & no RWA - contradicts this finding.   TRANSTHORACIC ECHOCARDIOGRAM  11/24/2019   -post anterior STEMI: EF mildly reduced 45 to 50%.  GR 1 DD.  Elevated LAP.  No obvious heart WMA.  Moderate-severe AI with mild-moderate Aortic Sclerosis but no Stenosis.   TRANSTHORACIC ECHOCARDIOGRAM  03/23/2020   (stroke evaluation): EF improved to 55 to 60%.  Significant LVH-notable apical.  Consider cardiac MRI.  Moderate concentric LVH.  GR 1 DD.  Elevated LAP.  Mild to moderate Aortic Sclerosis but no Stenosis.  Moderate AI.     Current Medications: No outpatient medications have been marked as taking for the 01/06/23 encounter (Appointment) with Cannon Kettle, PA-C.     Allergies:   Bee venom, Iodinated contrast media, and Tomato   Social History   Socioeconomic History   Marital status: Married    Spouse name: Not on file   Number of children: 0   Years of education: 6 th   Highest education level: Not on file  Occupational History   Not on file  Tobacco Use   Smoking status: Every Day    Packs/day: 1.50    Years: 20.00    Additional pack years: 0.00    Total pack years: 30.00    Types: Cigarettes   Smokeless tobacco: Never  Vaping Use   Vaping Use: Never used  Substance and Sexual Activity   Alcohol use: Not Currently   Drug use: Not Currently    Types:  "Crack" cocaine    Comment: last used yesterday. States " I do it everyday"    Sexual activity: Not on file  Other Topics Concern   Not on file  Social History Narrative   Right handed    Caffeine - no more than 2 cups    Social Determinants of Health   Financial Resource Strain: Not on file  Food Insecurity: No Food Insecurity (10/27/2022)   Hunger Vital Sign    Worried About Running Out of Food in the Last Year: Never true    Ran Out of Food in the Last Year: Never true  Transportation Needs: No Transportation Needs (10/27/2022)   PRAPARE - Administrator, Civil Service (Medical): No    Lack of Transportation (Non-Medical): No  Physical Activity: Not on file  Stress: Not on file  Social Connections: Not on file     Family History: The patient's family history includes Diabetes in her father, maternal grandfather, maternal grandmother, and mother; Hypertension  in her father and mother.  ROS:   Please see the history of present illness.     EKGs/Labs/Other Studies Reviewed:    EKG:  The ekg ordered today demonstrates ***  Recent Labs: 10/26/2022: ALT 12 10/27/2022: B Natriuretic Peptide 129.3; BUN 15; Creatinine, Ser 1.00; Hemoglobin 10.5; Platelets 143; Potassium 3.7; Sodium 134   Recent Lipid Panel Lab Results  Component Value Date/Time   CHOL 100 09/16/2022 09:50 AM   TRIG 67 09/16/2022 09:50 AM   HDL 47 09/16/2022 09:50 AM   LDLCALC 38 09/16/2022 09:50 AM    Physical Exam:    VS:  There were no vitals taken for this visit.   No data found.   Wt Readings from Last 3 Encounters:  12/24/22 145 lb (65.8 kg)  10/27/22 153 lb 3.5 oz (69.5 kg)  08/06/22 150 lb (68 kg)     GEN: *** Well nourished, well developed in no acute distress HEENT: Normal NECK: No JVD; No carotid bruits CARDIAC: ***RRR, no murmurs, rubs, gallops RESPIRATORY:  Clear to auscultation without rales, wheezing or rhonchi  ABDOMEN: Soft, non-tender, non-distended MUSCULOSKELETAL: No  edema SKIN: Warm and dry NEUROLOGIC:  Alert and oriented PSYCHIATRIC:  Normal affect     ASSESSMENT AND PLAN   ***   {Are you ordering a CV Procedure (e.g. stress test, cath, DCCV, TEE, etc)?   Press F2        :[id number]}    Medication Adjustments/Labs and Tests Ordered: Current medicines are reviewed at length with the patient today.  Concerns regarding medicines are outlined above.  No orders of the defined types were placed in this encounter.  No orders of the defined types were placed in this encounter.   There are no Patient Instructions on file for this visit.   Signed, Cannon Kettle, PA-C  01/04/2023 1:13 PM    [facility]

## 2023-01-05 ENCOUNTER — Ambulatory Visit: Payer: Medicaid Other | Admitting: Physician Assistant

## 2023-01-05 ENCOUNTER — Other Ambulatory Visit: Payer: Self-pay | Admitting: Cardiology

## 2023-01-06 ENCOUNTER — Ambulatory Visit: Payer: Medicaid Other | Admitting: Physician Assistant

## 2023-01-06 DIAGNOSIS — Z720 Tobacco use: Secondary | ICD-10-CM

## 2023-01-06 DIAGNOSIS — E785 Hyperlipidemia, unspecified: Secondary | ICD-10-CM

## 2023-01-06 DIAGNOSIS — I5032 Chronic diastolic (congestive) heart failure: Secondary | ICD-10-CM

## 2023-01-06 DIAGNOSIS — I25119 Atherosclerotic heart disease of native coronary artery with unspecified angina pectoris: Secondary | ICD-10-CM

## 2023-01-09 NOTE — Progress Notes (Unsigned)
Cardiology Clinic Note   Patient Name: Maria Harper Date of Encounter: 01/09/2023  Primary Care Provider:  Earnest Rosier, MD Primary Cardiologist:  Bryan Lemma, MD  Patient Profile    Maria Harper 52 year old female presents to the clinic today for follow-up evaluation of her coronary artery disease and chest discomfort.  Past Medical History    Past Medical History:  Diagnosis Date   Acid reflux    Cocaine abuse (HCC)    Coronary artery disease, occlusive 2017   2017 or 2018-PCI to LAD (mid-distal); -> anterior STEMI April 2021 -> LAD stent patent, however distal LAD at D3 occluded, only able to reduce to 70% stenosis.  Small caliber RPDA with 80% stenosis.   Diabetes (HCC)    Heart attack (HCC) 04/2016   High cholesterol    Hypertension    Ischemic cardiomyopathy 11/2019   Neuropathic pain    Stroke (HCC) 03/2020   Tobacco abuse    Past Surgical History:  Procedure Laterality Date   CORONARY/GRAFT ACUTE MI REVASCULARIZATION N/A 11/23/2019   Procedure: CORONARY/GRAFT ACUTE MI REVASCULARIZATION;  Surgeon: [name], MD;  Location: [facility] INVASIVE CV LAB;  Service: Cardiovascular; unsuccessful PTCA of distal LAD 100%.  O able to restore only minimal flow with 70% stenosis   LEFT HEART CATH AND CORONARY ANGIOGRAPHY N/A 11/23/2019   Procedure: LEFT HEART CATH AND CORONARY ANGIOGRAPHY;  Surgeon: [name], MD;  Location: [facility] INVASIVE CV LAB;  Service: Cardiovascular; ANTERIOR STEMI: Widely patent mid to distal LAD stent, distal LAD is 100% stenosed associated with the third diagonal.  (Post PTCA - 70% residual but 100% D3).  RPDA tapers to a small vessel -80% stenosis.  Severely elevated LVEDP   NM MYOVIEW LTD  08/27/2020   (recurrent chest pain): Poor study due to gut attenuation.  EF ~50-55%.  No EKG changes.  MLSTLY FIXED LARGE/SEVRE perfusion defect in the basal-apical inferior, mid inferolateral & mid-apical anterior / apical walls: Findings are c/w  known anatomy (apical LAD occlusion & PDA 80%, with gut attenuation enhancing inferior involvement -> but Echo with normal EF & no RWA - contradicts this finding.   TRANSTHORACIC ECHOCARDIOGRAM  11/24/2019   -post anterior STEMI: EF mildly reduced 45 to 50%.  GR 1 DD.  Elevated LAP.  No obvious heart WMA.  Moderate-severe AI with mild-moderate Aortic Sclerosis but no Stenosis.   TRANSTHORACIC ECHOCARDIOGRAM  03/23/2020   (stroke evaluation): EF improved to 55 to 60%.  Significant LVH-notable apical.  Consider cardiac MRI.  Moderate concentric LVH.  GR 1 DD.  Elevated LAP.  Mild to moderate Aortic Sclerosis but no Stenosis.  Moderate AI.     Allergies  Allergies  Allergen Reactions   Bee Venom Anaphylaxis   Iodinated Contrast Media Hives, Itching and Rash   Tomato Itching and Rash    History of Present Illness    Maria Harper is a PMH of coronary artery disease status post LAD stenting, HFpEF, hyperlipidemia, type 2 diabetes, GERD, CVA, HTN, depression, and anxiety.  She was admitted to the hospital on 10/26/2022 and discharged on 10/28/2022.  She reported consistent right-sided sharp chest discomfort that radiated under her right breast.  It was worse with movement and breathing.  Her EKG showed no ST changes and cardiac troponins were unremarkable.  There was low concern for ACS.  Cardiology was consulted.  An initial nuclear stress test was planned.  Chest CT showed no aortic changes but did note bilateral pulmonary  infiltrates consistent with pneumonia.  She had no symptoms for pneumonia and was not started on antibiotics.  Her echocardiogram was unremarkable.  Her urine drug screen was positive for cocaine.  Cardiology recommended increasing her Imdur to 120 mg daily.  She was discharged in stable condition.  She presents to the clinic today for follow-up evaluation and states***.  *** denies chest pain, shortness of breath, lower extremity edema, fatigue, palpitations, melena,  hematuria, hemoptysis, diaphoresis, weakness, presyncope, syncope, orthopnea, and PND.  HFpEF-no increased DOE or activity intolerance.  Admitted to the hospital 10/26/2022 and discharged on 10/28/2022.  She complained of chest discomfort.  Her echocardiogram was unremarkable.  She was noted to have positive urine drug screen.  (Cocaine) this was felt to be the cause of her discomfort.  Her Imdur was increased to 120 mg daily. Continue current medical therapy Increase physical activity as tolerated Low-sodium diet Continue to avoid recreational drugs  Chest discomfort-no chest pain today.  Denies recent episodes of arm neck back and chest discomfort. Continue to monitor Increase physical activity as tolerated  Coronary artery disease-status post PCI with LAD stenting and subsequent catheterization with PTCA of her distal LAD.  Details above. Continue current medical therapy Heart healthy low-sodium high-fiber diet  Hyperlipidemia-LDL***. High-fiber diet Continue statin therapy  Essential hypertension-BP today*** Maintain blood pressure log Continue amlodipine, carvedilol, Imdur  Disposition: Follow-up with Dr. Herbie Baltimore or me in 6 months.  Home Medications    Prior to Admission medications   Medication Sig Start Date End Date Taking? Authorizing Provider  ACCU-CHEK GUIDE test strip USE to check blood sugar TWICE DAILY. in THE morning FOR fasting AND 2 hours AFTER a meal 07/22/22   [provider]  acetaminophen (TYLENOL) 325 MG tablet Take 2 tablets (650 mg total) by mouth every 6 (six) hours as needed for mild pain. 10/28/22   Jerre Simon, MD  amLODipine (NORVASC) 2.5 MG tablet Take 2.5 mg by mouth daily. 12/01/22   [provider]  amLODipine (NORVASC) 5 MG tablet Take 1 tablet (5 mg total) by mouth daily. Patient taking differently: Take 2.5 mg by mouth daily. 04/16/22 04/11/23  [name], MD  atorvastatin (LIPITOR) 80 MG tablet TAKE 1 Tablet BY MOUTH ONCE DAILY  01/05/23   [name], MD  Bempedoic Acid-Ezetimibe (NEXLIZET) 180-10 MG TABS Take 1 tablet by mouth daily. 09/21/22   [name], MD  carvedilol (COREG) 12.5 MG tablet Take 1 tablet (12.5 mg total) by mouth 2 (two) times daily with a meal. Patient taking differently: Take 25 mg by mouth 2 (two) times daily with a meal. 04/16/22   [name], MD  carvedilol (COREG) 25 MG tablet Take 25 mg by mouth daily. 12/01/22   [provider]  CLARITIN 10 MG tablet Take 10 mg by mouth daily. 01/13/21   [provider]  clopidogrel (PLAVIX) 75 MG tablet Take 75 mg by mouth daily. 11/04/21   [provider]  cyclobenzaprine (FLEXERIL) 10 MG tablet Take 10 mg by mouth daily as needed for muscle spasms.    [provider]  doxepin (SINEQUAN) 50 MG capsule Take 150 mg by mouth at bedtime.    [provider]  FLUoxetine (PROZAC) 40 MG capsule Take 40 mg by mouth daily. 12/01/22   [provider]  glipiZIDE (GLUCOTROL XL) 5 MG 24 hr tablet Take 5 mg by mouth in the morning and at bedtime. 07/22/22   [provider]  hydrOXYzine (ATARAX) 25 MG  tablet Take 12.5-25 mg by mouth 3 (three) times daily as needed. 12/01/22   [provider]  insulin glargine (LANTUS) 100 unit/mL SOPN Inject 15 Units into the skin at bedtime. Patient not taking: Reported on 12/24/2022 10/28/22   Jerre Simon, MD  ipratropium (ATROVENT HFA) 17 MCG/ACT inhaler Inhale 2 puffs into the lungs daily as needed for wheezing.    [provider]  isosorbide mononitrate (IMDUR) 60 MG 24 hr tablet TAKE TWO TABLETS BY MOUTH EVERY DAY IN THE MORNING 12/01/22   [name], MD  JARDIANCE 25 MG TABS tablet Take 25 mg by mouth daily.    [provider]  LINZESS 145 MCG CAPS capsule Take 145 mcg by mouth daily. 11/23/21   [provider]  nicotine (NICODERM CQ - DOSED IN MG/24 HOURS) 21 mg/24hr patch Place 1 patch (21 mg total) onto the skin daily. 10/28/22    Jerre Simon, MD  nitroGLYCERIN (NITROSTAT) 0.4 MG SL tablet DISSOLVE 1 TABLET UNDER THE TONGUE EVERY 5 MINUTES AS NEEDED FOR CHEST PAIN. DO NOT EXCEED A TOTAL OF 3 DOSES IN 15 MINUTES. CALL 911 IF NO RELIEF. Patient taking differently: Place 0.4 mg under the tongue every 5 (five) minutes as needed for chest pain. 10/08/21   [name], MD  pantoprazole (PROTONIX) 40 MG tablet Take 1 tablet (40 mg total) by mouth 2 (two) times daily. KEEP OV. 09/29/21   [name], MD  polyethylene glycol powder [facility]) 17 GM/SCOOP powder SMARTSIG:12-38 Gram(s) By Mouth Once a Week 12/03/22   [provider]  pregabalin (LYRICA) 300 MG capsule Take 1 capsule (300 mg total) by mouth 2 (two) times daily. 12/29/22   Jones Bales, NP  QUEtiapine (SEROQUEL) 400 MG tablet Take 400 mg by mouth at bedtime. 04/05/18   [provider]  SEROQUEL 200 MG tablet Take 200 mg by mouth in the morning. 07/22/21   [provider]    Family History    Family History  Problem Relation Age of Onset   Hypertension Mother    Diabetes Mother    Hypertension Father    Diabetes Father    Diabetes Maternal Grandmother    Diabetes Maternal Grandfather    She indicated that her mother is deceased. She indicated that her father is deceased. She indicated that the status of her maternal grandmother is unknown. She indicated that the status of her maternal grandfather is unknown.  Social History    Social History   Socioeconomic History   Marital status: Married    Spouse name: Not on file   Number of children: 0   Years of education: 6 th   Highest education level: Not on file  Occupational History   Not on file  Tobacco Use   Smoking status: Every Day    Packs/day: 1.50    Years: 20.00    Additional pack years: 0.00    Total pack years: 30.00    Types: Cigarettes   Smokeless tobacco: Never  Vaping Use   Vaping Use: Never used  Substance and Sexual Activity   Alcohol use:  Not Currently   Drug use: Not Currently    Types: "Crack" cocaine    Comment: last used yesterday. States " I do it everyday"    Sexual activity: Not on file  Other Topics Concern   Not on file  Social History Narrative   Right handed    Caffeine - no more than 2 cups  Social Determinants of Health   Financial Resource Strain: Not on file  Food Insecurity: No Food Insecurity (10/27/2022)   Hunger Vital Sign    Worried About Running Out of Food in the Last Year: Never true    Ran Out of Food in the Last Year: Never true  Transportation Needs: No Transportation Needs (10/27/2022)   PRAPARE - Administrator, Civil Service (Medical): No    Lack of Transportation (Non-Medical): No  Physical Activity: Not on file  Stress: Not on file  Social Connections: Not on file  Intimate Partner Violence: Not At Risk (10/27/2022)   Humiliation, Afraid, Rape, and Kick questionnaire    Fear of Current or Ex-Partner: No    Emotionally Abused: No    Physically Abused: No    Sexually Abused: No     Review of Systems    General:  No chills, fever, night sweats or weight changes.  Cardiovascular:  No chest pain, dyspnea on exertion, edema, orthopnea, palpitations, paroxysmal nocturnal dyspnea. Dermatological: No rash, lesions/masses Respiratory: No cough, dyspnea Urologic: No hematuria, dysuria Abdominal:   No nausea, vomiting, diarrhea, bright red blood per rectum, melena, or hematemesis Neurologic:  No visual changes, wkns, changes in mental status. All other systems reviewed and are otherwise negative except as noted above.  Physical Exam    VS:  There were no vitals taken for this visit. , BMI There is no height or weight on file to calculate BMI. GEN: Well nourished, well developed, in no acute distress. HEENT: normal. Neck: Supple, no JVD, carotid bruits, or masses. Cardiac: RRR, no murmurs, rubs, or gallops. No clubbing, cyanosis, edema.  Radials/DP/PT 2+ and equal  bilaterally.  Respiratory:  Respirations regular and unlabored, clear to auscultation bilaterally. GI: Soft, nontender, nondistended, BS + x 4. MS: no deformity or atrophy. Skin: warm and dry, no rash. Neuro:  Strength and sensation are intact. Psych: Normal affect.  Accessory Clinical Findings    Recent Labs: 10/26/2022: ALT 12 10/27/2022: B Natriuretic Peptide 129.3; BUN 15; Creatinine, Ser 1.00; Hemoglobin 10.5; Platelets 143; Potassium 3.7; Sodium 134   Recent Lipid Panel    Component Value Date/Time   CHOL 100 09/16/2022 0950   TRIG 67 09/16/2022 0950   HDL 47 09/16/2022 0950   CHOLHDL 2.1 09/16/2022 0950   CHOLHDL 4.5 03/23/2020 1237   VLDL 39 03/23/2020 1237   LDLCALC 38 09/16/2022 0950    No BP recorded.  {Refresh Note OR Click here to enter BP  :1}***    ECG personally reviewed by me today- *** - No acute changes  Echocardiogram 10/27/2022  IMPRESSIONS     1. Anuerysmal apex with normal wall motion in other myocardial walls.  Left ventricular ejection fraction, by estimation, is 55 to 60%. The left  ventricle has normal function. The left ventricle has no regional wall  motion abnormalities. There is mild  concentric left ventricular hypertrophy. Left ventricular diastolic  parameters are indeterminate.   2. Right ventricular systolic function is normal. The right ventricular  size is normal. Tricuspid regurgitation signal is inadequate for assessing  PA pressure.   3. The mitral valve is normal in structure. No evidence of mitral valve  regurgitation. No evidence of mitral stenosis.   4. The aortic valve is normal in structure. Aortic valve regurgitation is  mild to moderate. Aortic valve sclerosis/calcification is present, without  any evidence of aortic stenosis. Aortic regurgitation PHT measures 412  msec.   5. The inferior vena cava is  normal in size with greater than 50%  respiratory variability, suggesting right atrial pressure of 3 mmHg.   FINDINGS    Left Ventricle: Anuerysmal apex with normal wall motion in other  myocardial walls. Left ventricular ejection fraction, by estimation, is 55  to 60%. The left ventricle has normal function. The left ventricle has no  regional wall motion abnormalities.  Definity contrast agent was given IV to delineate the left ventricular  endocardial borders. The left ventricular internal cavity size was normal  in size. There is mild concentric left ventricular hypertrophy. Left  ventricular diastolic parameters are  indeterminate.     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 The anterior wall, antero-lateral wall,  anterior septum, inferior wall, posterior wall, mid inferoseptal segment,  and  basal inferoseptal segment are normal.   Right Ventricle: The right ventricular size is normal. No increase in  right ventricular wall thickness. Right ventricular systolic function is  normal. Tricuspid regurgitation signal is inadequate for assessing PA  pressure.   Left Atrium: Left atrial size was normal in size.   Right Atrium: Right atrial size was normal in size.   Pericardium: There is no evidence of pericardial effusion.   Mitral Valve: The mitral valve is normal in structure. No evidence of  mitral valve regurgitation. No evidence of mitral valve stenosis.   Tricuspid Valve: The tricuspid valve is normal in structure. Tricuspid  valve regurgitation is not demonstrated. No evidence of tricuspid  stenosis.   Aortic Valve: The aortic valve is normal in structure. Aortic valve  regurgitation is mild to moderate. Aortic regurgitation PHT measures 412  msec. Aortic valve sclerosis/calcification is present, without any  evidence of aortic stenosis.   Pulmonic Valve: The pulmonic valve was normal in structure. Pulmonic valve  regurgitation is not visualized. No evidence of pulmonic stenosis.   Aorta: The aortic root is normal in size and structure.   Venous: The inferior vena cava  is normal in size with greater than 50%  respiratory variability, suggesting right atrial pressure of 3 mmHg.   IAS/Shunts: No atrial level shunt detected by color flow Doppler.    Cardiac catheterization 11/23/2019  CORONARY/GRAFT ACUTE MI REVASCULARIZATION  LEFT HEART CATH AND CORONARY ANGIOGRAPHY   Conclusion  Previously placed Mid LAD to Dist LAD stent (unknown type) is widely patent. Mid LAD lesion is 30% stenosed. CULPRIT LESION: Dist LAD-1 lesion is 100% stenosed with 100% stenosed side branch in 3rd Diag. Unsuccessful balloon angioplasty was performed using a BALLOON SAPPHIRE 2.0X12 -unable to advance into the distal vessel. Post intervention, there is a 70% residual stenosis followed by Dist LAD-2 lesion is 99% stenosed. Post intervention, the side branch was remained at 100% residual stenosis. -------------------------- RPDA lesion is 80% stenosed-tapers into very small vessel.. LV end diastolic pressure is severely elevated. Therefore no the ventriculogram was performed. There is no aortic valve stenosis.  Diagnostic Dominance: Right  Intervention      Assessment & Plan   1.  ***   Thomasene Ripple. Rozalia Dino NP-C     01/09/2023, 2:13 PM [facility] [address] Office [phone] Fax [phone]    I spent***minutes examining this patient, reviewing medications, and using patient centered shared decision making involving her cardiac care.  Prior to her visit I spent greater than 20 minutes reviewing her past medical history,  medications, and prior cardiac tests.

## 2023-01-12 ENCOUNTER — Encounter: Payer: Self-pay | Admitting: General Practice

## 2023-01-12 ENCOUNTER — Ambulatory Visit: Payer: Medicaid Other | Attending: Cardiology | Admitting: General Practice

## 2023-01-12 VITALS — BP 101/64 | HR 78 | Ht 62.0 in | Wt 149.0 lb

## 2023-01-12 DIAGNOSIS — E1169 Type 2 diabetes mellitus with other specified complication: Secondary | ICD-10-CM | POA: Diagnosis not present

## 2023-01-12 DIAGNOSIS — Z7984 Long term (current) use of oral hypoglycemic drugs: Secondary | ICD-10-CM

## 2023-01-12 DIAGNOSIS — R079 Chest pain, unspecified: Secondary | ICD-10-CM

## 2023-01-12 DIAGNOSIS — I10 Essential (primary) hypertension: Secondary | ICD-10-CM

## 2023-01-12 DIAGNOSIS — I5032 Chronic diastolic (congestive) heart failure: Secondary | ICD-10-CM | POA: Diagnosis not present

## 2023-01-12 DIAGNOSIS — E785 Hyperlipidemia, unspecified: Secondary | ICD-10-CM

## 2023-01-12 MED ORDER — CLOPIDOGREL BISULFATE 75 MG PO TABS: 75.0000 mg | ORAL_TABLET | Freq: Every day | ORAL | 1 refills | Status: DC

## 2023-01-12 MED ORDER — CARVEDILOL 25 MG PO TABS: 25.0000 mg | ORAL_TABLET | Freq: Every day | ORAL | 1 refills | Status: DC

## 2023-01-12 MED ORDER — ATORVASTATIN CALCIUM 80 MG PO TABS: 80.0000 mg | ORAL_TABLET | Freq: Every day | ORAL | 1 refills | Status: DC

## 2023-01-12 MED ORDER — AMLODIPINE BESYLATE 5 MG PO TABS: 2.5000 mg | ORAL_TABLET | Freq: Every day | ORAL | 1 refills | Status: DC

## 2023-01-12 MED ORDER — ISOSORBIDE MONONITRATE ER 60 MG PO TB24: ORAL_TABLET | ORAL | 3 refills | Status: DC

## 2023-01-12 MED ORDER — NITROGLYCERIN 0.4 MG SL SUBL: 0.4000 mg | SUBLINGUAL_TABLET | SUBLINGUAL | 1 refills | Status: DC | PRN

## 2023-01-12 NOTE — Addendum Note (Signed)
Addended by: Alyson Ingles on: 01/12/2023 10:31 AM   Modules accepted: Orders

## 2023-01-12 NOTE — Patient Instructions (Addendum)
Medication Instructions:  The current medical regimen is effective;  continue present plan and medications as directed. Please refer to the Current Medication list given to you today.  *If you need a refill on your cardiac medications before your next appointment, please call your pharmacy*  Lab Work: NONE If you have labs (blood work) drawn today and your tests are completely normal, you will receive your results only by: MyChart Message (if you have MyChart) OR  A paper copy in the mail If you have any lab test that is abnormal or we need to change your treatment, we will call you to review the results.  Testing/Procedures: NONE  Follow-Up: At [facility], you and your health needs are our priority.  As part of our continuing mission to provide you with exceptional heart care, we have created designated Provider Care Teams.  These Care Teams include your primary Cardiologist (physician) and Advanced Practice Providers (APPs -  Physician Assistants and Nurse Practitioners) who all work together to provide you with the care you need, when you need it.  We recommend signing up for the patient portal called "MyChart".  Sign up information is provided on this After Visit Summary.  MyChart is used to connect with patients for Virtual Visits (Telemedicine).  Patients are able to view lab/test results, encounter notes, upcoming appointments, etc.  Non-urgent messages can be sent to your provider as well.   To learn more about what you can do with MyChart, go to [email or web address].    Your next appointment:   6 month(s)  Provider:   Bryan Lemma, MD     Other Instructions INCREASE PHYSICAL ACTIVITY AS TOLERATED  Heart-Healthy Eating Plan Eating a healthy diet is important for the health of your heart. A heart-healthy eating plan includes: Eating less unhealthy fats. Eating more healthy fats. Eating less salt in your food. Salt is also called sodium. Making other changes in  your diet. Talk with your doctor or a diet specialist (dietitian) to create an eating plan that is right for you. What is my plan? What are tips for following this plan? Cooking Avoid frying your food. Try to bake, boil, grill, or broil it instead. You can also reduce fat by: Removing the skin from poultry. Removing all visible fats from meats. Steaming vegetables in water or broth. Meal planning  At meals, divide your plate into four equal parts: Fill one-half of your plate with vegetables and green salads. Fill one-fourth of your plate with whole grains. Fill one-fourth of your plate with lean protein foods. Eat 2-4 cups of vegetables per day. One cup of vegetables is: 1 cup (91 g) broccoli or cauliflower florets. 2 medium carrots. 1 large bell pepper. 1 large sweet potato. 1 large tomato. 1 medium white potato. 2 cups (150 g) raw leafy greens. Eat 1-2 cups of fruit per day. One cup of fruit is: 1 small apple 1 large banana 1 cup (237 g) mixed fruit, 1 large orange,  cup (82 g) dried fruit, 1 cup (240 mL) 100% fruit juice. Eat more foods that have soluble fiber. These are apples, broccoli, carrots, beans, peas, and barley. Try to get 20-30 g of fiber per day. Eat 4-5 servings of nuts, legumes, and seeds per week: 1 serving of dried beans or legumes equals  cup (90 g) cooked. 1 serving of nuts is  oz (12 almonds, 24 pistachios, or 7 walnut halves). 1 serving of seeds equals  oz (8 g). General information Eat more home-cooked  food. Eat less restaurant, buffet, and fast food. Limit or avoid alcohol. Limit foods that are high in starch and sugar. Avoid fried foods. Lose weight if you are overweight. Keep track of how much salt (sodium) you eat. This is important if you have high blood pressure. Ask your doctor to tell you more about this. Try to add vegetarian meals each week. Fats Choose healthy fats. These include olive oil and canola oil, flaxseeds, walnuts,  almonds, and seeds. Eat more omega-3 fats. These include salmon, mackerel, sardines, tuna, flaxseed oil, and ground flaxseeds. Try to eat fish at least 2 times each week. Check food labels. Avoid foods with trans fats or high amounts of saturated fat. Limit saturated fats. These are often found in animal products, such as meats, butter, and cream. These are also found in plant foods, such as palm oil, palm kernel oil, and coconut oil. Avoid foods with partially hydrogenated oils in them. These have trans fats. Examples are stick margarine, some tub margarines, cookies, crackers, and other baked goods. What foods should I eat? Fruits All fresh, canned (in natural juice), or frozen fruits. Vegetables Fresh or frozen vegetables (raw, steamed, roasted, or grilled). Green salads. Grains Most grains. Choose whole wheat and whole grains most of the time. Rice and pasta, including brown rice and pastas made with whole wheat. Meats and other proteins Lean, well-trimmed beef, veal, pork, and lamb. Chicken and [place] without skin. All fish and shellfish. Wild duck, rabbit, pheasant, and venison. Egg whites or low-cholesterol egg substitutes. Dried beans, peas, lentils, and tofu. Seeds and most nuts. Dairy Low-fat or nonfat cheeses, including ricotta and mozzarella. Skim or 1% milk that is liquid, powdered, or evaporated. Buttermilk that is made with low-fat milk. Nonfat or low-fat yogurt. Fats and oils Non-hydrogenated (trans-free) margarines. Vegetable oils, including soybean, sesame, sunflower, olive, peanut, safflower, corn, canola, and cottonseed. Salad dressings or mayonnaise made with a vegetable oil. Beverages Mineral water. Coffee and tea. Diet carbonated beverages. Sweets and desserts Sherbet, gelatin, and fruit ice. Small amounts of dark chocolate. Limit all sweets and desserts. Seasonings and condiments All seasonings and condiments. The items listed above may not be a complete list of  foods and drinks you can eat. Contact a dietitian for more options. What foods should I avoid? Fruits Canned fruit in heavy syrup. Fruit in cream or butter sauce. Fried fruit. Limit coconut. Vegetables Vegetables cooked in cheese, cream, or butter sauce. Fried vegetables. Grains Breads that are made with saturated or trans fats, oils, or whole milk. Croissants. Sweet rolls. Donuts. High-fat crackers, such as cheese crackers. Meats and other proteins Fatty meats, such as hot dogs, ribs, sausage, bacon, rib-eye roast or steak. High-fat deli meats, such as salami and bologna. Caviar. Domestic duck and goose. Organ meats, such as liver. Dairy Cream, sour cream, cream cheese, and creamed cottage cheese. Whole-milk cheeses. Whole or 2% milk that is liquid, evaporated, or condensed. Whole buttermilk. Cream sauce or high-fat cheese sauce. Yogurt that is made from whole milk. Fats and oils Meat fat, or shortening. Cocoa butter, hydrogenated oils, palm oil, coconut oil, palm kernel oil. Solid fats and shortenings, including bacon fat, salt pork, lard, and butter. Nondairy cream substitutes. Salad dressings with cheese or sour cream. Beverages Regular sodas and juice drinks with added sugar. Sweets and desserts Frosting. Pudding. Cookies. Cakes. Pies. Milk chocolate or white chocolate. Buttered syrups. Full-fat ice cream or ice cream drinks. The items listed above may not be a complete list of foods and  drinks to avoid. Contact a dietitian for more information. Summary Heart-healthy meal planning includes eating less unhealthy fats, eating more healthy fats, and making other changes in your diet. Eat a balanced diet. This includes fruits and vegetables, low-fat or nonfat dairy, lean protein, nuts and legumes, whole grains, and heart-healthy oils and fats. This information is not intended to replace advice given to you by your health care provider. Make sure you discuss any questions you have with your  health care provider. Document Revised: 08/24/2021 Document Reviewed: 08/24/2021 Elsevier Patient Education  2024 [employer].

## 2023-01-14 ENCOUNTER — Other Ambulatory Visit: Payer: Medicaid Other

## 2023-02-18 ENCOUNTER — Other Ambulatory Visit: Payer: Self-pay

## 2023-02-18 MED ORDER — NITROGLYCERIN 0.4 MG SL SUBL: 0.4000 mg | SUBLINGUAL_TABLET | SUBLINGUAL | 11 refills | Status: DC | PRN

## 2023-02-24 ENCOUNTER — Other Ambulatory Visit (HOSPITAL_COMMUNITY): Payer: Self-pay

## 2023-02-24 ENCOUNTER — Telehealth: Payer: Self-pay | Admitting: Cardiology

## 2023-02-24 ENCOUNTER — Telehealth: Payer: Self-pay

## 2023-02-24 NOTE — Telephone Encounter (Signed)
Pharmacy Patient Advocate Encounter   Received notification from Physician's Office/PHARMD-[facility] that prior authorization for JARDIANCE is required/requested.   Insurance verification completed.   The patient is insured through [facility] .   Per test claim: PA required; PA submitted to [facility] via CoverMyMeds Key/confirmation #/EOC [id number]       Status is pending

## 2023-02-24 NOTE — Telephone Encounter (Signed)
Pt c/o medication issue:  1. Name of Medication:   JARDIANCE 25 MG TABS tablet    2. How are you currently taking this medication (dosage and times per day)?   Take 25 mg by mouth daily.    3. Are you having a reaction (difficulty breathing--STAT)? No  4. What is your medication issue? Pt states she needs prior authorization for the medication. Please advise

## 2023-02-24 NOTE — Telephone Encounter (Signed)
Patient states on Jardiance prescribed by Dr Herbie Baltimore. She states needing PA.  She may just need the prescription.  Please advise as not sent previously from the office

## 2023-02-25 ENCOUNTER — Telehealth: Payer: Self-pay | Admitting: Cardiology

## 2023-02-25 MED ORDER — JARDIANCE 25 MG PO TABS: 25.0000 mg | ORAL_TABLET | Freq: Every day | ORAL | 11 refills | Status: DC

## 2023-02-25 NOTE — Telephone Encounter (Signed)
Patient is returning phone call, is requesting we call her at [phone]

## 2023-02-25 NOTE — Telephone Encounter (Signed)
Spoke to patient and informed her that PA approved and Rx sent to My Pharmacy

## 2023-02-25 NOTE — Telephone Encounter (Signed)
Pharmacy Patient Advocate Encounter  Received notification from [facility] that Prior Authorization for jardiance has been  Approved from 7.11.24 to 02/24/24

## 2023-02-25 NOTE — Telephone Encounter (Signed)
Left message on patient's voicemail stating the following from Melissa D. Roselind Messier, RPH-CPP:  PA approved and Rx sent to My Pharmacy

## 2023-02-25 NOTE — Telephone Encounter (Signed)
PA approved and Rx sent to My Pharmacy

## 2023-03-11 ENCOUNTER — Other Ambulatory Visit (HOSPITAL_COMMUNITY): Payer: Self-pay

## 2023-03-11 ENCOUNTER — Telehealth: Payer: Self-pay | Admitting: Pharmacy Technician

## 2023-03-11 NOTE — Telephone Encounter (Signed)
Pharmacy Patient Advocate Encounter   Received notification from CoverMyMeds that prior authorization for Nexlizet 180-10MG  tabs is required/requested.   Insurance verification completed.   The patient is insured through [facility] [place] .   Per test claim: Refill too soon. PA is not needed at this time. Medication was filled 02/18/2023. Next eligible fill date is 03/12/2023.

## 2023-03-22 ENCOUNTER — Other Ambulatory Visit: Payer: Self-pay | Admitting: Cardiology

## 2023-03-22 ENCOUNTER — Other Ambulatory Visit (HOSPITAL_COMMUNITY): Payer: Self-pay

## 2023-03-22 NOTE — Telephone Encounter (Signed)
Can you clarify regarding this medication.  I see there was a note that "too soon" but I do not see the RX on med list

## 2023-03-22 NOTE — Telephone Encounter (Signed)
I am unaware as to why the med is not on the med list, but there are notes in the chart with medication. Perhaps someone took it off the med list by accident?

## 2023-03-22 NOTE — Telephone Encounter (Signed)
Pt spouse calling about a refill on this med but I wasn't able to find it on pt's med list. Please advise if this can be sent over.

## 2023-03-22 NOTE — Telephone Encounter (Signed)
Patient medication Nexlizet is  not on med list.  Is seen in OV notes.  OK to refill??

## 2023-03-22 NOTE — Addendum Note (Signed)
Addended by: Candie Chroman on: 03/22/2023 04:10 PM   Modules accepted: Orders

## 2023-03-23 MED ORDER — NEXLIZET 180-10 MG PO TABS: 1.0000 | ORAL_TABLET | Freq: Every day | ORAL | 3 refills | Status: DC

## 2023-03-23 NOTE — Telephone Encounter (Signed)
Medication sent.

## 2023-03-23 NOTE — Telephone Encounter (Signed)
Yes that is okay to improve.  This was the change back in the spring when they stopped Repatha and started Nexlizet.  Bryan Lemma, MD

## 2023-03-23 NOTE — Addendum Note (Signed)
Addended by: Candie Chroman on: 03/23/2023 02:37 PM   Modules accepted: Orders

## 2023-03-24 ENCOUNTER — Telehealth: Payer: Self-pay | Admitting: Pharmacy Technician

## 2023-03-24 ENCOUNTER — Telehealth: Payer: Self-pay | Admitting: Cardiology

## 2023-03-24 ENCOUNTER — Other Ambulatory Visit (HOSPITAL_COMMUNITY): Payer: Self-pay

## 2023-03-24 NOTE — Telephone Encounter (Signed)
Pt c/o medication issue:  1. Name of Medication:   Bempedoic Acid-Ezetimibe (NEXLIZET) 180-10 MG TABS   2. How are you currently taking this medication (dosage and times per day)?   3. Are you having a reaction (difficulty breathing--STAT)?   4. What is your medication issue?    Caller wanted to know if patient experienced any unexpected toxicity with this medication.  Caller stated this is regarding patient's prior authorization request.  Case# [id number]

## 2023-03-24 NOTE — Telephone Encounter (Signed)
Pharmacy Patient Advocate Encounter   Received notification from CoverMyMeds that prior authorization for Nexlizet 180-10MG  tablets is required/requested.   Insurance verification completed.   The patient is insured through [facility] [place] .   Per test claim: PA required; PA submitted to [facility] [place] Medicaid via CoverMyMeds Key/confirmation #/EOC [id number] Status is pending

## 2023-03-24 NOTE — Telephone Encounter (Signed)
Attempt to contact.  After multiple transfers still no representative.  Will wait for call back.  Patient has not taken this medication yet, first prescription

## 2023-03-25 ENCOUNTER — Other Ambulatory Visit (HOSPITAL_COMMUNITY): Payer: Self-pay

## 2023-03-25 ENCOUNTER — Encounter: Payer: Medicaid Other | Admitting: Physical Medicine & Rehabilitation

## 2023-03-25 NOTE — Telephone Encounter (Signed)
Attempted to contact. Unable to reach a representative.

## 2023-03-25 NOTE — Telephone Encounter (Signed)
Pharmacy Patient Advocate Encounter  Received notification from [facility] Medicaid that Prior Authorization for Nexlizet has been APPROVED from 03/24/23 to 03/23/24. Ran test claim, Copay is $4.00. This test claim was processed through [facility]- copay amounts may vary at other pharmacies due to pharmacy/plan contracts, or as the patient moves through the different stages of their insurance plan.   PA #/Case ID/Reference #: [id number]

## 2023-04-01 NOTE — Telephone Encounter (Signed)
This note is 41 days old, has already been addressed in other encounter, PA has been approved.

## 2023-04-22 ENCOUNTER — Telehealth: Payer: Self-pay

## 2023-04-22 ENCOUNTER — Encounter: Payer: Medicaid Other | Admitting: Physical Medicine & Rehabilitation

## 2023-04-22 NOTE — Telephone Encounter (Signed)
Patient called in regarding refill for Pregabalin, per pmp she has 2 refills remaining, called patient left VM to advise she needs to call pharmacy to get refill and also call our office for an appointment

## 2023-04-28 LAB — EXTERNAL GENERIC LAB PROCEDURE

## 2023-05-16 LAB — EXTERNAL GENERIC LAB PROCEDURE: COLOGUARD: NEGATIVE

## 2023-05-31 ENCOUNTER — Other Ambulatory Visit: Payer: Self-pay | Admitting: Registered Nurse

## 2023-06-06 ENCOUNTER — Other Ambulatory Visit: Payer: Self-pay

## 2023-06-06 MED ORDER — CARVEDILOL 25 MG PO TABS: 25.0000 mg | ORAL_TABLET | Freq: Every day | ORAL | 2 refills | Status: DC

## 2023-06-06 MED ORDER — ATORVASTATIN CALCIUM 80 MG PO TABS: 80.0000 mg | ORAL_TABLET | Freq: Every day | ORAL | 2 refills | Status: DC

## 2023-06-06 MED ORDER — CARVEDILOL 25 MG PO TABS: 25.0000 mg | ORAL_TABLET | Freq: Every day | ORAL | 1 refills | Status: DC

## 2023-06-06 MED ORDER — AMLODIPINE BESYLATE 5 MG PO TABS: 2.5000 mg | ORAL_TABLET | Freq: Every day | ORAL | 1 refills | Status: DC

## 2023-06-06 MED ORDER — ATORVASTATIN CALCIUM 80 MG PO TABS: 80.0000 mg | ORAL_TABLET | Freq: Every day | ORAL | 1 refills | Status: DC

## 2023-06-06 MED ORDER — AMLODIPINE BESYLATE 5 MG PO TABS: 2.5000 mg | ORAL_TABLET | Freq: Every day | ORAL | 2 refills | Status: DC

## 2023-07-18 NOTE — Progress Notes (Deleted)
  Cardiology Office Note:  .   Date:  07/18/2023  ID:  Maria Harper, DOB 09-10-1970, MRN [id number] PCP: Earnest Rosier, MD  [facility] Providers Cardiologist:  Bryan Lemma, MD {  }   History of Present Illness: .   Maria Harper is a 52 y.o. female with history of CAD, status post stent to distal LAD with unsuccessful balloon angioplasty performed as he was unable to advance into the distal vessel, however there was a 70% residual stenosis followed by the distal to lesion of 99% stenosis.  RPDA had 80% stenosis and tapered into a very small vessel hyperlipidemia and hypertension.  She has a history of cocaine abuse.  Last seen in the office by Edd Fabian on 01/12/2023 was stable from a cardiac standpoint and having what he described as noncardiac chest pain.  She was using her nitroglycerin as needed.  ROS: ***  Studies Reviewed: .        *** EKG Interpretation Date/Time:    Ventricular Rate:    PR Interval:    QRS Duration:    QT Interval:    QTC Calculation:   R Axis:      Text Interpretation:      Physical Exam:   VS:  There were no vitals taken for this visit.   Wt Readings from Last 3 Encounters:  01/12/23 149 lb (67.6 kg)  12/24/22 145 lb (65.8 kg)  10/27/22 153 lb 3.5 oz (69.5 kg)    GEN: Well nourished, well developed in no acute distress NECK: No JVD; No carotid bruits CARDIAC: ***RRR, no murmurs, rubs, gallops RESPIRATORY:  Clear to auscultation without rales, wheezing or rhonchi  ABDOMEN: Soft, non-tender, non-distended EXTREMITIES:  No edema; No deformity   ASSESSMENT AND PLAN: .   ***    {Are you ordering a CV Procedure (e.g. stress test, cath, DCCV, TEE, etc)?   Press F2        :[id number]}    Signed, Bettey Mare. Liborio Nixon, ANP, AACC

## 2023-07-25 ENCOUNTER — Ambulatory Visit: Payer: Medicaid Other | Attending: Adult Health | Admitting: Adult Health

## 2023-07-29 ENCOUNTER — Other Ambulatory Visit: Payer: Self-pay | Admitting: Cardiology

## 2023-08-23 ENCOUNTER — Telehealth: Payer: Self-pay | Admitting: Physical Medicine & Rehabilitation

## 2023-08-23 MED ORDER — PREGABALIN 300 MG PO CAPS: 300.0000 mg | ORAL_CAPSULE | Freq: Two times a day (BID) | ORAL | 5 refills | Status: DC

## 2023-08-23 NOTE — Telephone Encounter (Signed)
Patient came into office and scheduled a follow up appointment for 09/06/23. She is asking for refill on Pregabalin.

## 2023-09-06 ENCOUNTER — Encounter: Payer: Medicaid Other | Attending: Physical Medicine & Rehabilitation | Admitting: Physical Medicine & Rehabilitation

## 2023-09-06 NOTE — Progress Notes (Signed)
 Cardiology Office Note   Date:  09/07/2023  ID:  Maria, Harper [birth year], MRN [id number] PCP:  Cecilia Kevin MATSU, NP [facility] Cardiologist: Alm Clay, MD  Reason for visit: 58-month follow-up  History of Present Illness    Maria Harper is a 53 y.o. female with a hx of coronary artery disease status post LAD stenting, HFpEF, hyperlipidemia, type 2 diabetes, GERD, CVA (decreased feeling on her right side), HTN, depression, and anxiety.  She was admitted to the hospital in March 2024 with sharp right sided chest discomfort.  Echo unremarkable, drug screen positive for cocaine.  Cardiology increased her Imdur  to 120 mg daily.  She was last seen by Josefa Beauvais in June 2024.  She mention occasional chest discomfort using sublingual nitro rarely.  Her discomfort occurred mainly at night and improved with position change.  Today, she comes in with her husband.  Her primary concern is persistent chest pain.  She states she gets episodic sharp pain shooting across her chest.  Symptoms can occur at rest, no known trigger/aggravation.  Symptoms can happen every other day.  No radiation, no associated symptoms.  She does feel like it has increased in frequency.  She sometimes uses 3 nitroglycerin  to help relieve.  She gets her nitro sublingual bottle filled every month.  She denies nausea, presyncope.    Her husband notices some shortness of breath with sleeping.  They states she had a negative sleep apnea test.  She sleeps with 3 pillows for comfort.  She states her inhaler helps shortness of breath.  She continues to smoke half pack per day.  She uses nicotine  patches to help quit.  She denies recent drug use.    I noticed that her medication list is carvedilol  25 mg once daily.  She states she was previously on it twice daily, she does not recall a reason for this change.   Objective / Physical Exam   EKG today: Normal sinus rhythm with heart rate 79.  Vital signs:   BP 116/62 (BP Location: Right Leg, Patient Position: Sitting, Cuff Size: Normal)   Pulse 79   Ht 5' 2 (1.575 m)   Wt 154 lb 12.8 oz (70.2 kg)   SpO2 95%   BMI 28.31 kg/m     GEN: No acute distress NECK: No carotid bruits CARDIAC: RRR, no murmurs RESPIRATORY:  Clear to auscultation without rales, wheezing or rhonchi  EXTREMITIES: No edema  Assessment and Plan   Precordial pain -Admitted in March 2024 with chest pain, found cocaine positive.  Imdur  increased.  Drug cessation recommended. -Pt denies recent drug use.   -Recommend changing her carvedilol  from 25 mg once daily to 25 mg twice daily. -Will send a prescription for nitro sublingual spray as this can be more effective than sublingual tablets. -Continue Imdur  120 mg daily. -Continue amlodipine  -she states she is takes a whole tablet daily.  Do not see much room for titration given systolic blood pressures 100s to 110s. -Will order cardiac stress PET to rule out ischemia.  She does have continued risk factors with her diabetes and tobacco use. -If she has presumed small vessel disease, may consider adding Ranexa to her anginal therapy. -Will follow-up in 4 to 6 weeks to assess anginal burden. -Continue Plavix  monotherapy.  Coronary artery disease  -post PCI with LAD stenting and subsequent catheterization with PTCA of her distal LAD -See above. -Continue Plavix  monotherapy, beta-blocker therapy, lipid-lowering therapy and Jardiance .  HFpEF, euvolemic -Continue Jardiance ,  on diabetic dosing.  Not on standing diuretics.  Hypertension, well-controlled. -Continue current medications/meds as above. -Goal BP is <130/80.  Recommend DASH diet (high in vegetables, fruits, low-fat dairy products, whole grains, poultry, fish, and nuts and low in sweets, sugar-sweetened beverages, and red meats), salt restriction and increase physical activity.  Hyperlipidemia with goal LDL less than 70 -LDL 38 in February 2024.  Changed from  Repatha  to Nexilet. -Patient is currently on Lipitor  80 mg daily and Nexlizet  180-10 mg once daily.   -Check fasting lipids today. -Recommend cholesterol lowering diets - Mediterranean diet, DASH diet, vegetarian diet, low-carbohydrate diet and avoidance of trans fats.  Discussed healthier choice substitutes.  Nuts, high-fiber foods, and fiber supplements may also improve lipids.    Tobacco use  -Recommend tobacco cessation.  Reviewed physiologic effects of nicotine  and the immediate-eventual benefits of quitting including improvement in cough/breathing and reduction in cardiovascular events.  Discussed quitting tips such as removing triggers and getting support from family/friends and Quitline [place].  Diabetes mellitus -Hemoglobin A1c 12.5% in March 2024.  Recheck hemoglobin A1c today.. -Her med list includes Jardiance  25 mg daily, glipizide 5 mg twice daily and Lantus  15 units at bedtime.  Disposition - Follow-up in 4-6 weeks.     Signed, [name], PA-C  09/07/2023 [facility]

## 2023-09-07 ENCOUNTER — Encounter: Payer: Self-pay | Admitting: Physician Assistant

## 2023-09-07 ENCOUNTER — Ambulatory Visit: Payer: Medicaid Other | Attending: Physician Assistant | Admitting: Physician Assistant

## 2023-09-07 VITALS — BP 116/62 | HR 79 | Ht 62.0 in | Wt 154.8 lb

## 2023-09-07 DIAGNOSIS — R072 Precordial pain: Secondary | ICD-10-CM

## 2023-09-07 DIAGNOSIS — I10 Essential (primary) hypertension: Secondary | ICD-10-CM | POA: Diagnosis not present

## 2023-09-07 DIAGNOSIS — Z79899 Other long term (current) drug therapy: Secondary | ICD-10-CM

## 2023-09-07 DIAGNOSIS — E785 Hyperlipidemia, unspecified: Secondary | ICD-10-CM

## 2023-09-07 DIAGNOSIS — E118 Type 2 diabetes mellitus with unspecified complications: Secondary | ICD-10-CM

## 2023-09-07 DIAGNOSIS — I5032 Chronic diastolic (congestive) heart failure: Secondary | ICD-10-CM

## 2023-09-07 DIAGNOSIS — I25118 Atherosclerotic heart disease of native coronary artery with other forms of angina pectoris: Secondary | ICD-10-CM

## 2023-09-07 DIAGNOSIS — I25110 Atherosclerotic heart disease of native coronary artery with unstable angina pectoris: Secondary | ICD-10-CM

## 2023-09-07 DIAGNOSIS — Z794 Long term (current) use of insulin: Secondary | ICD-10-CM

## 2023-09-07 MED ORDER — CARVEDILOL 25 MG PO TABS: 25.0000 mg | ORAL_TABLET | Freq: Two times a day (BID) | ORAL | 3 refills | Status: DC

## 2023-09-07 MED ORDER — NITROGLYCERIN 0.4 MG/SPRAY TL SOLN: 1.0000 | 12 refills | Status: DC | PRN

## 2023-09-07 MED ORDER — NITROGLYCERIN 0.4 MG/SPRAY TL SOLN: 1.0000 | 5 refills | Status: DC | PRN

## 2023-09-07 NOTE — Patient Instructions (Addendum)
 Medication Instructions:  NITROLINGUAL  O.4 MG SPRAY AS NEEDED FOR CHEST PAIN *If you need a refill on your cardiac medications before your next appointment, please call your pharmacy*   Lab Work: FASTING LIPIDS, HEMOGLOBIN A1C, AND BMET TODAY If you have labs (blood work) drawn today and your tests are completely normal, you will receive your results only by: MyChart Message (if you have MyChart) OR A paper copy in the mail If you have any lab test that is abnormal or we need to change your treatment, we will call you to review the results.   Testing/Procedures:    Please report to Radiology at the [facility] Main Entrance 30 minutes early for your test.  [address] [place], [place] [address]  How to Prepare for Your Cardiac PET/CT Stress Test:  Nothing to eat or drink, except water, 3 hours prior to arrival time.  NO caffeine/decaffeinated products, or chocolate 12 hours prior to arrival. (Please note decaffeinated beverages (teas/coffees) still contain caffeine).  If you have caffeine within 12 hours prior, the test will need to be rescheduled.  Medication instructions: Do not take erectile dysfunction medications for 72 hours prior to test (sildenafil, tadalafil) Do not take nitrates (isosorbide  mononitrate, Ranexa) the day before or day of test Do not take tamsulosin the day before or morning of test Hold theophylline containing medications for 12 hours. Hold Dipyridamole 48 hours prior to the test.  Diabetic Preparation: If able to eat breakfast prior to 3 hour fasting, you may take all medications, including your insulin . Do not worry if you miss your breakfast dose of insulin  - start at your next meal. If you do not eat prior to 3 hour fast-Hold all diabetes (oral and insulin ) medications. Patients who wear a continuous glucose monitor MUST remove the device prior to scanning.  You may take your remaining medications with water.  NO perfume, cologne or  lotion on chest or abdomen area. FEMALES - Please avoid wearing dresses to this appointment.  Total time is 1 to 2 hours; you may want to bring reading material for the waiting time.  IF YOU THINK YOU MAY BE PREGNANT, OR ARE NURSING PLEASE INFORM THE TECHNOLOGIST.  In preparation for your appointment, medication and supplies will be purchased.  Appointment availability is limited, so if you need to cancel or reschedule, please call the Radiology Department Scheduler at [phone] 24 hours in advance to avoid a cancellation fee of $100.00  What to Expect When you Arrive:  Once you arrive and check in for your appointment, you will be taken to a preparation room within the Radiology Department.  A technologist or Nurse will obtain your medical history, verify that you are correctly prepped for the exam, and explain the procedure.  Afterwards, an IV will be started in your arm and electrodes will be placed on your skin for EKG monitoring during the stress portion of the exam. Then you will be escorted to the PET/CT scanner.  There, staff will get you positioned on the scanner and obtain a blood pressure and EKG.  During the exam, you will continue to be connected to the EKG and blood pressure machines.  A small, safe amount of a radioactive tracer will be injected in your IV to obtain a series of pictures of your heart along with an injection of a stress agent.    After your Exam:  It is recommended that you eat a meal and drink a caffeinated beverage to counter act  any effects of the stress agent.  Drink plenty of fluids for the remainder of the day and urinate frequently for the first couple of hours after the exam.  Your doctor will inform you of your test results within 7-10 business days.  For more information and frequently asked questions, please visit our website: [email or web address]  For questions about your test or how to prepare for your test, please call: Cardiac  Imaging Nurse Navigators Office: [phone]    Follow-Up: At [facility], you and your health needs are our priority.  As part of our continuing mission to provide you with exceptional heart care, we have created designated Provider Care Teams.  These Care Teams include your primary Cardiologist (physician) and Advanced Practice Providers (APPs -  Physician Assistants and Nurse Practitioners) who all work together to provide you with the care you need, when you need it.  We recommend signing up for the patient portal called MyChart.  Sign up information is provided on this After Visit Summary.  MyChart is used to connect with patients for Virtual Visits (Telemedicine).  Patients are able to view lab/test results, encounter notes, upcoming appointments, etc.  Non-urgent messages can be sent to your provider as well.   To learn more about what you can do with MyChart, go to [email or web address].    Your next appointment:   4-6 week(s)  Provider:   Delon Holts, PA    Other Instructions

## 2023-09-08 ENCOUNTER — Telehealth: Payer: Self-pay | Admitting: Cardiology

## 2023-09-08 NOTE — Telephone Encounter (Signed)
 Pt c/o medication issue:  1. Name of Medication:   nitroGLYCERIN  (NITROLINGUAL ) 0.4 MG/SPRAY spray   2. How are you currently taking this medication (dosage and times per day)?   3. Are you having a reaction (difficulty breathing--STAT)?   4. What is your medication issue?   Caller (Adderly) stated patient's insurance will not cover this medication and wants to if prescription can be sent for sublingual version instead.

## 2023-09-08 NOTE — Telephone Encounter (Signed)
Please advise if okay to change to tablets

## 2023-09-09 LAB — BASIC METABOLIC PANEL
BUN/Creatinine Ratio: 14 (ref 9–23)
BUN: 21 mg/dL (ref 6–24)
CO2: 19 mmol/L — ABNORMAL LOW (ref 20–29)
Calcium: 10.2 mg/dL (ref 8.7–10.2)
Chloride: 97 mmol/L (ref 96–106)
Creatinine, Ser: 1.55 mg/dL — ABNORMAL HIGH (ref 0.57–1.00)
Glucose: 382 mg/dL — ABNORMAL HIGH (ref 70–99)
Potassium: 4.8 mmol/L (ref 3.5–5.2)
Sodium: 136 mmol/L (ref 134–144)
eGFR: 41 mL/min/{1.73_m2} — ABNORMAL LOW (ref >59)

## 2023-09-09 LAB — LIPID PANEL
Chol/HDL Ratio: 4 {ratio} (ref 0.0–4.4)
Cholesterol, Total: 158 mg/dL (ref 100–199)
HDL: 40 mg/dL (ref >39)
LDL Chol Calc (NIH): 88 mg/dL (ref 0–99)
Triglycerides: 175 mg/dL — ABNORMAL HIGH (ref 0–149)
VLDL Cholesterol Cal: 30 mg/dL (ref 5–40)

## 2023-09-09 LAB — HEMOGLOBIN A1C
Est. average glucose Bld gHb Est-mCnc: 387 mg/dL
Hgb A1c MFr Bld: 15.1 % — ABNORMAL HIGH (ref 4.8–5.6)

## 2023-09-13 ENCOUNTER — Telehealth: Payer: Self-pay

## 2023-09-13 MED ORDER — NITROGLYCERIN 0.4 MG SL SUBL: 0.4000 mg | SUBLINGUAL_TABLET | SUBLINGUAL | 5 refills | Status: DC | PRN

## 2023-09-13 NOTE — Telephone Encounter (Addendum)
Called patient regarding results. Patient had understanding of results. Patient advised to schedule appointment with PCP for diabetes control.----- Message from Cannon Kettle sent at 09/13/2023 12:20 PM EST ----- LDL (bad cholesterol) is up from last year but not too far from goal.   --> Continue Lipitor 80 mg daily and Nexlizet 180-10 mg once daily.   --> Recommend cholesterol lowering diets - Mediterranean diet, DASH diet, vegetarian diet, low-carbohydrate diet and avoidance of trans fats.  Nuts, high-fiber foods, and fiber supplements may also improve lipids.   -To prevent plaque buildup/heart attack, I believe the most important thing is to quit smoking and get your diabetes under control.  Let us know if you would like an appointment with our health coach to help your ability to succeed.  Your hemoglobin A1c has increased to 15.1% suggesting an average blood sugar of 387.  Please make sure you are taking your diabetic medications regularly and schedule follow-up with your primary care.  Bring your blood sugar log to your appointment.  Your kidney function is also elevated.  Recommend staying hydrated and rechecking levels with your PCP.  Uncontrolled diabetes can injure your kidneys.

## 2023-09-13 NOTE — Telephone Encounter (Signed)
Called patient and order sent to pharm per Juanda Crumble  for nitroglycerin 0.4mg   25 tablets with 3 refills.  Patient made aware she  can take nitroglycerin 0.4mg  SL take 1 tablet every 5 minutes for chest pain, up to 3 tablets.Patient verbalized an understanding.

## 2023-10-04 NOTE — Progress Notes (Deleted)
 Cardiology Office Note   Date:  10/04/2023  ID:  Maria Harper, DOB 11/29/1970, MRN [id number] PCP:  Loney Laurence, NP [facility] Cardiologist: Bryan Lemma, MD  Reason for visit: 4-6 week follow-up on chest pain  History of Present Illness    Maria Harper is a 53 y.o. female with a hx of  coronary artery disease status post LAD stenting, HFpEF, hyperlipidemia, type 2 diabetes, GERD, CVA (decreased feeling on her right side), HTN, depression, and anxiety.  She was admitted to the hospital in March 2024 with sharp right sided chest discomfort.  Echo unremarkable, drug screen positive for cocaine.  Cardiology increased her Imdur to 120 mg daily.   She was last seen by Edd Fabian in June 2024.  She mention occasional chest discomfort using sublingual nitro rarely.  Her discomfort occurred mainly at night and improved with position change.  I last saw her on September 07, 2023.  She came in with her husband and complained of persistent chest pain.  She gets episodic sharp pain shooting across her chest without known trigger.  Symptoms could happen every other day, sometimes using 3 nitroglycerin to relieve.  Patient is continue to smoke half pack per day.  We discovered she was taking carvedilol 25 mg only once daily.  I recommend she increase her carvedilol to 25 mg twice daily.  Cardiac stress PET ordered to rule out ischemia with her risk factors of diabetes and tobacco use.  Recommend she follow-up in 1 month to assess anginal burden.  *Recheck BMET (Cr up to 1.55 at last appt) Scheduled for cardiac stress PET on April 29 Consider Ranexa  Today, ***  Precordial pain -Admitted in March 2024 with chest pain, found cocaine positive.  Imdur increased.  Drug cessation recommended. -Scheduled for cardiac stress PET on November 29 2023 -***  -Pt denies recent drug use.   -Recommend changing her carvedilol from 25 mg once daily to 25 mg twice daily. -Will send a  prescription for nitro sublingual spray as this can be more effective than sublingual tablets. -Continue Imdur 120 mg daily. -Continue amlodipine -she states she is takes a whole tablet daily.  Do not see much room for titration given systolic blood pressures 100s to 110s. -Will order cardiac stress PET to rule out ischemia.  She does have continued risk factors with her diabetes and tobacco use. -If she has presumed small vessel disease, may consider adding Ranexa to her anginal therapy. -Will follow-up in 4 to 6 weeks to assess anginal burden. -Continue Plavix monotherapy.   Coronary artery disease  -post PCI with LAD stenting and subsequent catheterization with PTCA of her distal LAD -See above. -Continue Plavix monotherapy, beta-blocker therapy, lipid-lowering therapy and Jardiance.   HFpEF, euvolemic -Continue Jardiance, on diabetic dosing.  Not on standing diuretics.   Hypertension, well-controlled. -Continue current medications. -Goal BP is <130/80.  Recommend DASH diet (high in vegetables, fruits, low-fat dairy products, whole grains, poultry, fish, and nuts and low in sweets, sugar-sweetened beverages, and red meats), salt restriction and increase physical activity.   Hyperlipidemia with goal LDL less than 70 -LDL 38 in February 2024.  Changed from Repatha to Nexilet. -LDL 09/07/2023: 88.  Continue  Lipitor 80 mg daily and Nexlizet 180-10 mg once daily. -Recommend cholesterol lowering diets - Mediterranean diet, DASH diet, vegetarian diet, low-carbohydrate diet and avoidance of trans fats.  Discussed healthier choice substitutes.  Nuts, high-fiber foods, and fiber supplements may also improve lipids.  Tobacco use  -Recommend tobacco cessation.  Reviewed physiologic effects of nicotine and the immediate-eventual benefits of quitting including improvement in cough/breathing and reduction in cardiovascular events.  Discussed quitting tips such as removing triggers and getting support  from family/friends and [employer] [place].   Diabetes mellitus -Hemoglobin A1c 12.5% in March 2024.  Recheck 09/07/23: 15.1%. Recommended she take her diabetic medications regularly and follow-up with her PCP with her blood glucose log.   Disposition - Follow-up in ***      Objective / Physical Exam   EKG today: ***  Vital signs:  There were no vitals taken for this visit.    GEN: No acute distress NECK: No carotid bruits CARDIAC: ***RRR, no murmurs RESPIRATORY:  Clear to auscultation without rales, wheezing or rhonchi  EXTREMITIES: No edema  Assessment and Plan   ***   {Are you ordering a CV Procedure (e.g. stress test, cath, DCCV, TEE, etc)?   Press F2        :[id number]}    Signed, Bernette Mayers  10/04/2023 [facility]

## 2023-10-05 ENCOUNTER — Other Ambulatory Visit: Payer: Self-pay

## 2023-10-05 ENCOUNTER — Ambulatory Visit: Payer: Medicaid Other | Attending: Physician Assistant | Admitting: Physician Assistant

## 2023-10-05 ENCOUNTER — Emergency Department (HOSPITAL_COMMUNITY)
Admission: EM | Admit: 2023-10-05 | Discharge: 2023-10-05 | Disposition: A | Discharge status: Home / Self Care | Attending: Emergency Medicine | Admitting: Emergency Medicine

## 2023-10-05 ENCOUNTER — Emergency Department (HOSPITAL_COMMUNITY)

## 2023-10-05 DIAGNOSIS — Z79899 Other long term (current) drug therapy: Secondary | ICD-10-CM | POA: Diagnosis not present

## 2023-10-05 DIAGNOSIS — R739 Hyperglycemia, unspecified: Secondary | ICD-10-CM

## 2023-10-05 DIAGNOSIS — E1165 Type 2 diabetes mellitus with hyperglycemia: Secondary | ICD-10-CM | POA: Diagnosis not present

## 2023-10-05 DIAGNOSIS — K5900 Constipation, unspecified: Secondary | ICD-10-CM | POA: Diagnosis not present

## 2023-10-05 DIAGNOSIS — I25110 Atherosclerotic heart disease of native coronary artery with unstable angina pectoris: Secondary | ICD-10-CM

## 2023-10-05 DIAGNOSIS — I2510 Atherosclerotic heart disease of native coronary artery without angina pectoris: Secondary | ICD-10-CM | POA: Insufficient documentation

## 2023-10-05 DIAGNOSIS — R109 Unspecified abdominal pain: Secondary | ICD-10-CM | POA: Diagnosis present

## 2023-10-05 DIAGNOSIS — R072 Precordial pain: Secondary | ICD-10-CM

## 2023-10-05 DIAGNOSIS — Z794 Long term (current) use of insulin: Secondary | ICD-10-CM | POA: Insufficient documentation

## 2023-10-05 DIAGNOSIS — Z7984 Long term (current) use of oral hypoglycemic drugs: Secondary | ICD-10-CM | POA: Insufficient documentation

## 2023-10-05 DIAGNOSIS — I10 Essential (primary) hypertension: Secondary | ICD-10-CM | POA: Diagnosis not present

## 2023-10-05 DIAGNOSIS — Z720 Tobacco use: Secondary | ICD-10-CM

## 2023-10-05 DIAGNOSIS — E785 Hyperlipidemia, unspecified: Secondary | ICD-10-CM

## 2023-10-05 LAB — CBC
HCT: 34.9 % — ABNORMAL LOW (ref 36.0–46.0)
Hemoglobin: 11.4 g/dL — ABNORMAL LOW (ref 12.0–15.0)
MCH: 28.9 pg (ref 26.0–34.0)
MCHC: 32.7 g/dL (ref 30.0–36.0)
MCV: 88.6 fL (ref 80.0–100.0)
Platelets: 180 10*3/uL (ref 150–400)
RBC: 3.94 MIL/uL (ref 3.87–5.11)
RDW: 15.8 % — ABNORMAL HIGH (ref 11.5–15.5)
WBC: 10.1 10*3/uL (ref 4.0–10.5)
nRBC: 0 % (ref 0.0–0.2)

## 2023-10-05 LAB — URINALYSIS, ROUTINE W REFLEX MICROSCOPIC
Bilirubin Urine: NEGATIVE
Glucose, UA: >=500 mg/dL — AB
Ketones, ur: 20 mg/dL — AB
Nitrite: NEGATIVE
Protein, ur: NEGATIVE mg/dL
Specific Gravity, Urine: 1.03 (ref 1.005–1.030)
WBC, UA: >50 WBC/hpf (ref 0–5)
pH: 5 (ref 5.0–8.0)

## 2023-10-05 LAB — COMPREHENSIVE METABOLIC PANEL
ALT: 12 U/L (ref 0–44)
AST: 12 U/L — ABNORMAL LOW (ref 15–41)
Albumin: 3.6 g/dL (ref 3.5–5.0)
Alkaline Phosphatase: 76 U/L (ref 38–126)
Anion gap: 11 (ref 5–15)
BUN: 14 mg/dL (ref 6–20)
CO2: 25 mmol/L (ref 22–32)
Calcium: 9.1 mg/dL (ref 8.9–10.3)
Chloride: 104 mmol/L (ref 98–111)
Creatinine, Ser: 1.46 mg/dL — ABNORMAL HIGH (ref 0.44–1.00)
GFR, Estimated: 44 mL/min — ABNORMAL LOW (ref >60)
Glucose, Bld: 362 mg/dL — ABNORMAL HIGH (ref 70–99)
Potassium: 3.3 mmol/L — ABNORMAL LOW (ref 3.5–5.1)
Sodium: 140 mmol/L (ref 135–145)
Total Bilirubin: 0.7 mg/dL (ref 0.0–1.2)
Total Protein: 7.7 g/dL (ref 6.5–8.1)

## 2023-10-05 LAB — LIPASE, BLOOD: Lipase: 27 U/L (ref 11–51)

## 2023-10-05 LAB — HCG, SERUM, QUALITATIVE: Preg, Serum: NEGATIVE

## 2023-10-05 MED ORDER — MILK OF MAGNESIA 7.75 % PO SUSP: 15.0000 mL | Freq: Every day | ORAL | 0 refills | Status: DC | PRN

## 2023-10-05 MED ORDER — FLEET ENEMA RE ENEM: 1.0000 | ENEMA | Freq: Once | RECTAL | 0 refills | Status: AC

## 2023-10-05 MED ORDER — DICYCLOMINE HCL 20 MG PO TABS: 20.0000 mg | ORAL_TABLET | Freq: Two times a day (BID) | ORAL | 0 refills | Status: DC

## 2023-10-05 MED ORDER — INSULIN ASPART 100 UNIT/ML IJ SOLN
5.0000 [IU] | Freq: Once | INTRAMUSCULAR | Status: AC
  Administered 2023-10-05: 5 [IU] via SUBCUTANEOUS
  Filled 2023-10-05: qty 0.05

## 2023-10-05 MED ORDER — DICYCLOMINE HCL 10 MG PO CAPS
10.0000 mg | ORAL_CAPSULE | Freq: Once | ORAL | Status: AC
  Administered 2023-10-05: 10 mg via ORAL
  Filled 2023-10-05: qty 1

## 2023-10-05 MED ORDER — POLYETHYLENE GLYCOL 3350 17 G PO PACK: 17.0000 g | PACK | Freq: Every day | ORAL | 0 refills | Status: DC

## 2023-10-05 MED ORDER — SENNOSIDES-DOCUSATE SODIUM 8.6-50 MG PO TABS: 1.0000 | ORAL_TABLET | Freq: Every day | ORAL | 0 refills | Status: DC

## 2023-10-05 MED ORDER — POTASSIUM CHLORIDE CRYS ER 20 MEQ PO TBCR
40.0000 meq | EXTENDED_RELEASE_TABLET | Freq: Once | ORAL | Status: AC
  Administered 2023-10-05: 40 meq via ORAL
  Filled 2023-10-05: qty 2

## 2023-10-05 MED ORDER — KETOROLAC TROMETHAMINE 30 MG/ML IJ SOLN
30.0000 mg | Freq: Once | INTRAMUSCULAR | Status: AC
  Administered 2023-10-05: 30 mg via INTRAMUSCULAR
  Filled 2023-10-05: qty 1

## 2023-10-05 MED ORDER — ACETAMINOPHEN 325 MG PO TABS
650.0000 mg | ORAL_TABLET | Freq: Once | ORAL | Status: AC
  Administered 2023-10-05: 650 mg via ORAL
  Filled 2023-10-05: qty 2

## 2023-10-05 MED ORDER — METAMUCIL SMOOTH TEXTURE 58.6 % PO POWD: 1.0000 | Freq: Three times a day (TID) | ORAL | 12 refills | Status: DC

## 2023-10-05 NOTE — ED Triage Notes (Signed)
 Pt arrived via POV. C/o Abd pain, and constipation for 4x days. Yeast infection for 1 month- was treated for it, w/no relief.   Has tried laxatives w/no change  AOx4

## 2023-10-05 NOTE — ED Provider Notes (Signed)
 [facility] EMERGENCY DEPARTMENT AT [facility] Provider Note   CSN: [id number] Arrival date & time: 10/05/23  1093     History  Chief Complaint  Patient presents with   Abdominal Pain   Constipation   Vaginitis    Maria Harper is a 53 y.o. female.  Patient is a 53 year old female with a past medical history of diabetes, CAD, hypertension presenting to the emergency department with abdominal pain and constipation.  Patient states that she has been constipated for the last 4 days and has had increased generalized abdominal pain as well as rectal pain.  She states that she takes Linzess at home and normally has a bowel movement every 3 days.  She states that she has not taken any additional medications for constipation or for pain.  She states that she does not feel like she is passing gas.  She denies any fevers, nausea or vomiting.  She denies prior abdominal surgery.  She also reports that she is having pain around her vagina.  She states that she was treated for a yeast infection but states that did not improve her symptoms.  She denies any vaginal discharge or itching.  The history is provided by the patient.  Abdominal Pain Associated symptoms: constipation   Constipation Associated symptoms: abdominal pain        Home Medications Prior to Admission medications   Medication Sig Start Date End Date Taking? Authorizing Provider  dicyclomine (BENTYL) 20 MG tablet Take 1 tablet (20 mg total) by mouth 2 (two) times daily. 10/05/23  Yes Theresia Lo, [place] K, DO  magnesium hydroxide (MILK OF MAGNESIA) 400 MG/5ML suspension Take 15 mLs by mouth daily as needed for mild constipation. 10/05/23  Yes Theresia Lo, [place] K, DO  polyethylene glycol (MIRALAX) 17 g packet Take 17 g by mouth daily. 10/05/23  Yes Theresia Lo, Cashe Gatt K, DO  psyllium (METAMUCIL SMOOTH TEXTURE) 58.6 % powder Take 1 packet by mouth 3 (three) times daily. 10/05/23  Yes Theresia Lo, Turkey K, DO  senna-docusate  (SENOKOT-S) 8.6-50 MG tablet Take 1 tablet by mouth daily. 10/05/23  Yes Theresia Lo, [place] K, DO  sodium phosphate (FLEET) ENEM Place 133 mLs (1 enema total) rectally once for 1 dose. 10/05/23 10/05/23 Yes Kingsley, Benetta Spar K, DO  ACCU-CHEK GUIDE test strip USE to check blood sugar TWICE DAILY. in THE morning FOR fasting AND 2 hours AFTER a meal 07/22/22   [provider]  Accu-Chek Softclix Lancets lancets 2 (two) times daily.    [provider]  amLODipine (NORVASC) 5 MG tablet Take 0.5 tablets (2.5 mg total) by mouth daily. 06/06/23   Ronney Asters, NP  atorvastatin (LIPITOR) 80 MG tablet Take 1 tablet (80 mg total) by mouth daily. 06/06/23   Ronney Asters, NP  Bempedoic Acid-Ezetimibe (NEXLIZET) 180-10 MG TABS Take 1 Tablet by mouth once daily 07/29/23   [name], MD  carvedilol (COREG) 25 MG tablet Take 1 tablet (25 mg total) by mouth in the morning and at bedtime. 09/07/23   Cannon Kettle, PA-C  CLARITIN 10 MG tablet Take 10 mg by mouth daily. 01/13/21   [provider]  clopidogrel (PLAVIX) 75 MG tablet Take 1 tablet (75 mg total) by mouth daily. 01/12/23   Ronney Asters, NP  cyclobenzaprine (FLEXERIL) 10 MG tablet Take 10 mg by mouth daily as needed for muscle spasms.    [provider]  desvenlafaxine (PRISTIQ) 25 MG 24 hr tablet Take by mouth. 02/24/23  [provider]  doxepin (SINEQUAN) 50 MG capsule Take 150 mg by mouth at bedtime.    [provider]  fluconazole (DIFLUCAN) 150 MG tablet Take 150 mg by mouth once. 09/05/23   [provider]  FLUoxetine (PROZAC) 40 MG capsule Take 40 mg by mouth daily. 12/01/22   [provider]  gabapentin (NEURONTIN) 300 MG capsule Take 300 mg by mouth 3 (three) times daily. 08/23/23   [provider]  GAVILYTE-G 236 g solution SMARTSIG:2 By Mouth As Directed 12/03/22   [provider]  glipiZIDE (GLUCOTROL XL) 5 MG 24 hr tablet Take 5 mg by mouth in the  morning and at bedtime. 07/22/22   [provider]  GLOBAL EASE INJECT PEN NEEDLES 32G X 4 MM MISC SMARTSIG:Injection 3 Times Daily 02/23/23   [provider]  hydrOXYzine (ATARAX) 25 MG tablet Take 25 mg by mouth every 8 (eight) hours as needed for nausea, anxiety, vomiting or itching. 12/01/22   [provider]  insulin glargine (LANTUS) 100 unit/mL SOPN Inject 15 Units into the skin at bedtime. 10/28/22   Jerre Simon, MD  ipratropium (ATROVENT HFA) 17 MCG/ACT inhaler Inhale 2 puffs into the lungs daily as needed for wheezing.    [provider]  isosorbide mononitrate (IMDUR) 60 MG 24 hr tablet TAKE TWO TABLETS BY MOUTH EVERY DAY IN THE MORNING 01/12/23   Ronney Asters, NP  JARDIANCE 25 MG TABS tablet Take 1 tablet (25 mg total) by mouth daily. 02/25/23   [name], MD  LINZESS 145 MCG CAPS capsule Take 145 mcg by mouth daily. 11/23/21   [provider]  LINZESS 290 MCG CAPS capsule Take 290 mcg by mouth daily. 03/21/23   [provider]  nicotine (NICODERM CQ - DOSED IN MG/24 HOURS) 14 mg/24hr patch 14 mg daily. 03/22/23   [provider]  nicotine (NICODERM CQ - DOSED IN MG/24 HOURS) 21 mg/24hr patch Place 1 patch (21 mg total) onto the skin daily. 10/28/22   Jerre Simon, MD  nitroGLYCERIN (NITROSTAT) 0.4 MG SL tablet Place 1 tablet (0.4 mg total) under the tongue every 5 (five) minutes as needed for chest pain. 09/13/23   Cannon Kettle, PA-C  pantoprazole (PROTONIX) 40 MG tablet Take 1 tablet by mouth 2 (two) times daily. 01/05/23   [provider]  polyethylene glycol powder (GLYCOLAX/MIRALAX) 17 GM/SCOOP powder SMARTSIG:12-38 Gram(s) By Mouth Once a Week 12/03/22   [provider]  pregabalin (LYRICA) 300 MG capsule Take 1 capsule (300 mg total) by mouth 2 (two) times daily. 08/23/23   Kirsteins, Victorino Sparrow, MD  QUEtiapine (SEROQUEL) 400 MG tablet Take 400 mg by mouth at bedtime. 04/05/18   [provider]  QUEtiapine (SEROQUEL) 400 MG tablet Take by mouth. 04/05/18   [provider]  REXULTI 1 MG TABS tablet Take 1 mg by mouth every morning. 08/23/23   [provider]  SEROQUEL 200 MG tablet Take 200 mg by mouth in the morning. 07/22/21   [provider]  Vitamin D, Ergocalciferol, 50000 units CAPS Take 1 capsule by mouth once a week. 03/21/23   [provider]      Allergies    Bee venom, Iodinated contrast media, and Tomato    Review of Systems   Review of Systems  Gastrointestinal:  Positive for abdominal pain and constipation.    Physical Exam Updated Vital Signs BP (!) 143/105 (BP Location: Left Arm)   Pulse 79  Temp 98.4 F (36.9 C) (Oral)   Resp 18   Ht 5\' 2"  (1.575 m)   Wt 70 kg   SpO2 94%   BMI 28.23 kg/m  Physical Exam Vitals and nursing note reviewed.  Constitutional:      General: She is not in acute distress.    Appearance: She is well-developed.  HENT:     Head: Normocephalic and atraumatic.     Mouth/Throat:     Mouth: Mucous membranes are moist.  Eyes:     Extraocular Movements: Extraocular movements intact.  Cardiovascular:     Rate and Rhythm: Normal rate and regular rhythm.     Heart sounds: Normal heart sounds.  Pulmonary:     Effort: Pulmonary effort is normal.     Breath sounds: Normal breath sounds.  Abdominal:     General: There is distension.     Palpations: Abdomen is soft.     Tenderness: There is generalized abdominal tenderness. There is no guarding or rebound.  Skin:    General: Skin is warm and dry.  Neurological:     General: No focal deficit present.     Mental Status: She is alert and oriented to person, place, and time.  Psychiatric:        Mood and Affect: Mood normal.        Behavior: Behavior normal.     ED Results / Procedures / Treatments   Labs (all labs ordered are listed, but only abnormal results are displayed) Labs Reviewed  COMPREHENSIVE METABOLIC PANEL - Abnormal; Notable  for the following components:      Result Value   Potassium 3.3 (*)    Glucose, Bld 362 (*)    Creatinine, Ser 1.46 (*)    AST 12 (*)    GFR, Estimated 44 (*)    All other components within normal limits  CBC - Abnormal; Notable for the following components:   Hemoglobin 11.4 (*)    HCT 34.9 (*)    RDW 15.8 (*)    All other components within normal limits  URINALYSIS, ROUTINE W REFLEX MICROSCOPIC - Abnormal; Notable for the following components:   APPearance HAZY (*)    Glucose, UA >=500 (*)    Hgb urine dipstick SMALL (*)    Ketones, ur 20 (*)    Leukocytes,Ua SMALL (*)    Bacteria, UA FEW (*)    All other components within normal limits  LIPASE, BLOOD  HCG, SERUM, QUALITATIVE    EKG None  Radiology DG Abd Portable 1 View Result Date: 10/05/2023 CLINICAL DATA:  Constipation EXAM: PORTABLE ABDOMEN - 1 VIEW COMPARISON:  10/06/2021 FINDINGS: The bowel gas pattern is normal. No radio-opaque calculi or other significant radiographic abnormality are seen. Moderate stool in the colon. IMPRESSION: Negative.  Moderate stool burden Electronically Signed   By: Jasmine Pang M.D.   On: 10/05/2023 19:34    Procedures Procedures    Medications Ordered in ED Medications  potassium chloride SA (KLOR-CON M) CR tablet 40 mEq (40 mEq Oral Given 10/05/23 1628)  insulin aspart (novoLOG) injection 5 Units (5 Units Subcutaneous Given 10/05/23 1629)  acetaminophen (TYLENOL) tablet 650 mg (650 mg Oral Given 10/05/23 1628)  dicyclomine (BENTYL) capsule 10 mg (10 mg Oral Given by Other 10/05/23 1629)  ketorolac (TORADOL) 30 MG/ML injection 30 mg (30 mg Intramuscular Given 10/05/23 1742)    ED Course/ Medical Decision Making/ A&P Clinical Course as of 10/05/23 2004  Wed Oct 05, 2023  1711 Few bacteria  with WBCs in urine, no urinary symptoms though making UTI unlikely.  [VK]  1939 Non-obstructive bowel pattern on XR with moderate stool burden. Patient is stable for discharge home on bowel regimen and will  be recommended outpatient follow up. [VK]    Clinical Course User Index [VK] Rexford Maus, DO                                 Medical Decision Making This patient presents to the ED with chief complaint(s) of abdominal pain, constipation with pertinent past medical history of hypertension, diabetes, CAD which further complicates the presenting complaint. The complaint involves an extensive differential diagnosis and also carries with it a high risk of complications and morbidity.    The differential diagnosis includes constipation, obstruction, no fever or point abdominal tenderness making intra-abdominal infection less likely, no abnormal discharge making STD or PID less likely, no urinary symptoms making UTI less likely  Additional history obtained: Additional history obtained from spouse Records reviewed outpatient cardiology records  ED Course and Reassessment: On patient's arrival she is hemodynamically stable in no acute distress.  She was initially evaluated by provider in triage and had labs and urine ordered.  Patient's labs show hyperglycemia without evidence of DKA and mild hypokalemia, otherwise are at her baseline.  Urine is pending at this time.  With patient having diminished bowel sounds and not passing gas, will have abdominal x-ray to evaluate for possible obstruction versus constipation.  She was given Tylenol and Bentyl and will additionally be given insulin and potassium for repletion and will be closely reassessed.  Independent labs interpretation:  The following labs were independently interpreted: Hyperglycemia without DKA, mild hypokalemia, otherwise at baseline  Independent visualization of imaging: - I independently visualized the following imaging with scope of interpretation limited to determining acute life threatening conditions related to emergency care: Abd XR, which revealed moderate stool, non-obstructive pattern  Consultation: - Consulted or  discussed management/test interpretation w/ external professional: N/A  Consideration for admission or further workup: Patient has no emergent conditions requiring admission or further work-up at this time and is stable for discharge home with primary care follow-up  Social Determinants of health: N/A    Amount and/or Complexity of Data Reviewed Labs: ordered. Radiology: ordered.  Risk OTC drugs. Prescription drug management.          Final Clinical Impression(s) / ED Diagnoses Final diagnoses:  Hyperglycemia  Constipation, unspecified constipation type    Rx / DC Orders ED Discharge Orders          Ordered    sodium phosphate (FLEET) ENEM   Once        10/05/23 2001    polyethylene glycol (MIRALAX) 17 g packet  Daily        10/05/23 2001    psyllium (METAMUCIL SMOOTH TEXTURE) 58.6 % powder  3 times daily        10/05/23 2001    senna-docusate (SENOKOT-S) 8.6-50 MG tablet  Daily        10/05/23 2001    magnesium hydroxide (MILK OF MAGNESIA) 400 MG/5ML suspension  Daily PRN        10/05/23 2001    dicyclomine (BENTYL) 20 MG tablet  2 times daily        10/05/23 2003              Rexford Maus, DO 10/05/23 2004

## 2023-10-05 NOTE — Discharge Instructions (Addendum)
 You were seen in the emergency department for your constipation.  I have given you a bowel regimen of medications that you should take for your constipation.  Today you can take the Fleet enema as well as the milk of magnesia which should help relieve the constipation so that you can have a bowel movement.  You can then take MiraLAX every day until you are having regular soft bowel movements daily, then can cut the dose in half until you are again having regular soft bowel movements daily and then can use as needed.  You should also take the Senokot daily to help keep your stools soft.  Metamucil is a fiber supplement that you should take daily to help prevent recurrent constipation.  You should make sure that you are drinking plenty of fluids and staying well-hydrated.  You can follow-up with your primary doctor in the next few days to have your symptoms rechecked. You should also have your blood sugar rechecked with your primary doctor. You should return to the emergency department if you are having significantly worsening pain, start having repetitive running, you have fevers or if you have any other new or concerning symptoms.

## 2023-10-18 ENCOUNTER — Ambulatory Visit: Payer: Medicaid Other | Admitting: Gastroenterology

## 2023-11-01 DEATH — deceased

## 2023-11-29 ENCOUNTER — Ambulatory Visit (HOSPITAL_COMMUNITY): Payer: Medicaid Other
